# Patient Record
Sex: Female | Born: 1935 | Race: White | Hispanic: No | Smoking: Former smoker
Health system: Southern US, Community
[De-identification: ages and names within clinical notes are randomized; demographics above are authoritative.]

## PROBLEM LIST (undated history)

## (undated) DIAGNOSIS — K922 Gastrointestinal hemorrhage, unspecified: Secondary | ICD-10-CM

## (undated) DIAGNOSIS — I779 Disorder of arteries and arterioles, unspecified: Secondary | ICD-10-CM

## (undated) DIAGNOSIS — K5790 Diverticulosis of intestine, part unspecified, without perforation or abscess without bleeding: Secondary | ICD-10-CM

## (undated) DIAGNOSIS — E785 Hyperlipidemia, unspecified: Secondary | ICD-10-CM

## (undated) DIAGNOSIS — K259 Gastric ulcer, unspecified as acute or chronic, without hemorrhage or perforation: Secondary | ICD-10-CM

## (undated) DIAGNOSIS — E669 Obesity, unspecified: Secondary | ICD-10-CM

## (undated) DIAGNOSIS — I739 Peripheral vascular disease, unspecified: Secondary | ICD-10-CM

## (undated) DIAGNOSIS — I5032 Chronic diastolic (congestive) heart failure: Secondary | ICD-10-CM

## (undated) DIAGNOSIS — D500 Iron deficiency anemia secondary to blood loss (chronic): Secondary | ICD-10-CM

## (undated) DIAGNOSIS — I2510 Atherosclerotic heart disease of native coronary artery without angina pectoris: Secondary | ICD-10-CM

## (undated) DIAGNOSIS — I890 Lymphedema, not elsewhere classified: Secondary | ICD-10-CM

## (undated) DIAGNOSIS — F17201 Nicotine dependence, unspecified, in remission: Secondary | ICD-10-CM

## (undated) DIAGNOSIS — I10 Essential (primary) hypertension: Secondary | ICD-10-CM

## (undated) DIAGNOSIS — I482 Chronic atrial fibrillation, unspecified: Secondary | ICD-10-CM

## (undated) DIAGNOSIS — I701 Atherosclerosis of renal artery: Secondary | ICD-10-CM

## (undated) DIAGNOSIS — Z7901 Long term (current) use of anticoagulants: Secondary | ICD-10-CM

## (undated) DIAGNOSIS — K31819 Angiodysplasia of stomach and duodenum without bleeding: Secondary | ICD-10-CM

## (undated) HISTORY — DX: Gastrointestinal hemorrhage, unspecified: K92.2

## (undated) HISTORY — DX: Lymphedema, not elsewhere classified: I89.0

## (undated) HISTORY — DX: Hyperlipidemia, unspecified: E78.5

## (undated) HISTORY — PX: CAROTID ENDARTERECTOMY: SUR193

## (undated) HISTORY — DX: Atherosclerosis of renal artery: I70.1

## (undated) HISTORY — DX: Essential (primary) hypertension: I10

## (undated) HISTORY — DX: Chronic diastolic (congestive) heart failure: I50.32

## (undated) HISTORY — DX: Long term (current) use of anticoagulants: Z79.01

## (undated) HISTORY — DX: Chronic atrial fibrillation, unspecified: I48.20

## (undated) HISTORY — DX: Peripheral vascular disease, unspecified: I73.9

## (undated) HISTORY — DX: Atherosclerotic heart disease of native coronary artery without angina pectoris: I25.10

## (undated) HISTORY — DX: Obesity, unspecified: E66.9

## (undated) HISTORY — DX: Iron deficiency anemia secondary to blood loss (chronic): D50.0

## (undated) HISTORY — DX: Nicotine dependence, unspecified, in remission: F17.201

## (undated) HISTORY — DX: Disorder of arteries and arterioles, unspecified: I77.9

## (undated) HISTORY — PX: APPENDECTOMY: SHX54

## (undated) HISTORY — PX: CORONARY ANGIOPLASTY: SHX604

---

## 2001-06-05 ENCOUNTER — Ambulatory Visit (HOSPITAL_COMMUNITY): Admission: RE | Admit: 2001-06-05 | Discharge: 2001-06-05 | Payer: Self-pay | Admitting: Cardiology

## 2001-06-05 ENCOUNTER — Encounter: Payer: Self-pay | Admitting: Cardiology

## 2001-06-06 ENCOUNTER — Ambulatory Visit (HOSPITAL_COMMUNITY): Admission: RE | Admit: 2001-06-06 | Discharge: 2001-06-07 | Payer: Self-pay | Admitting: *Deleted

## 2001-07-08 ENCOUNTER — Encounter: Admission: RE | Admit: 2001-07-08 | Discharge: 2001-10-06 | Payer: Self-pay | Admitting: Cardiology

## 2001-12-22 ENCOUNTER — Ambulatory Visit (HOSPITAL_COMMUNITY): Admission: RE | Admit: 2001-12-22 | Discharge: 2001-12-22 | Payer: Self-pay | Admitting: Cardiology

## 2001-12-22 ENCOUNTER — Encounter: Payer: Self-pay | Admitting: Cardiology

## 2002-05-20 ENCOUNTER — Ambulatory Visit (HOSPITAL_COMMUNITY): Admission: RE | Admit: 2002-05-20 | Discharge: 2002-05-21 | Payer: Self-pay | Admitting: Cardiology

## 2002-05-20 ENCOUNTER — Encounter: Payer: Self-pay | Admitting: Cardiology

## 2003-01-19 ENCOUNTER — Ambulatory Visit (HOSPITAL_COMMUNITY): Admission: RE | Admit: 2003-01-19 | Discharge: 2003-01-19 | Payer: Self-pay | Admitting: *Deleted

## 2003-07-22 ENCOUNTER — Encounter (INDEPENDENT_AMBULATORY_CARE_PROVIDER_SITE_OTHER): Payer: Self-pay | Admitting: *Deleted

## 2003-07-22 ENCOUNTER — Inpatient Hospital Stay (HOSPITAL_COMMUNITY): Admission: RE | Admit: 2003-07-22 | Discharge: 2003-07-23 | Payer: Self-pay | Admitting: Vascular Surgery

## 2003-08-19 ENCOUNTER — Ambulatory Visit (HOSPITAL_COMMUNITY): Admission: RE | Admit: 2003-08-19 | Discharge: 2003-08-19 | Payer: Self-pay | Admitting: Family Medicine

## 2004-05-09 ENCOUNTER — Ambulatory Visit: Payer: Self-pay | Admitting: *Deleted

## 2005-05-09 ENCOUNTER — Ambulatory Visit: Payer: Self-pay | Admitting: *Deleted

## 2006-05-22 ENCOUNTER — Ambulatory Visit: Payer: Self-pay | Admitting: Cardiology

## 2006-08-28 ENCOUNTER — Ambulatory Visit: Payer: Self-pay | Admitting: Vascular Surgery

## 2007-03-11 ENCOUNTER — Ambulatory Visit: Payer: Self-pay | Admitting: Vascular Surgery

## 2007-10-06 ENCOUNTER — Ambulatory Visit: Payer: Self-pay | Admitting: Cardiology

## 2007-10-07 ENCOUNTER — Ambulatory Visit: Payer: Self-pay | Admitting: Vascular Surgery

## 2008-04-12 ENCOUNTER — Ambulatory Visit: Payer: Self-pay | Admitting: Vascular Surgery

## 2008-09-06 ENCOUNTER — Other Ambulatory Visit: Admission: RE | Admit: 2008-09-06 | Discharge: 2008-09-06 | Payer: Self-pay | Admitting: Orthopaedic Surgery

## 2008-09-14 ENCOUNTER — Ambulatory Visit: Payer: Self-pay | Admitting: Vascular Surgery

## 2008-09-23 ENCOUNTER — Encounter (INDEPENDENT_AMBULATORY_CARE_PROVIDER_SITE_OTHER): Payer: Self-pay | Admitting: *Deleted

## 2008-09-27 ENCOUNTER — Encounter: Payer: Self-pay | Admitting: Cardiology

## 2008-09-27 ENCOUNTER — Ambulatory Visit: Payer: Self-pay | Admitting: Cardiology

## 2008-09-28 ENCOUNTER — Telehealth: Payer: Self-pay | Admitting: Cardiology

## 2008-09-29 ENCOUNTER — Encounter: Payer: Self-pay | Admitting: Vascular Surgery

## 2008-09-29 ENCOUNTER — Ambulatory Visit: Payer: Self-pay | Admitting: Vascular Surgery

## 2008-09-29 ENCOUNTER — Inpatient Hospital Stay (HOSPITAL_COMMUNITY): Admission: RE | Admit: 2008-09-29 | Discharge: 2008-09-30 | Payer: Self-pay | Admitting: Vascular Surgery

## 2008-10-12 ENCOUNTER — Ambulatory Visit: Payer: Self-pay | Admitting: Vascular Surgery

## 2009-04-07 ENCOUNTER — Ambulatory Visit: Payer: Self-pay | Admitting: Vascular Surgery

## 2009-08-15 ENCOUNTER — Ambulatory Visit (HOSPITAL_COMMUNITY): Admission: RE | Admit: 2009-08-15 | Discharge: 2009-08-15 | Payer: Self-pay | Admitting: Family Medicine

## 2009-08-16 ENCOUNTER — Ambulatory Visit (HOSPITAL_COMMUNITY): Admission: RE | Admit: 2009-08-16 | Discharge: 2009-08-16 | Payer: Self-pay | Admitting: Family Medicine

## 2009-08-31 ENCOUNTER — Inpatient Hospital Stay (HOSPITAL_COMMUNITY): Admission: EM | Admit: 2009-08-31 | Discharge: 2009-09-03 | Payer: Self-pay | Admitting: Emergency Medicine

## 2009-09-01 ENCOUNTER — Encounter (INDEPENDENT_AMBULATORY_CARE_PROVIDER_SITE_OTHER): Payer: Self-pay | Admitting: Internal Medicine

## 2009-09-01 ENCOUNTER — Ambulatory Visit: Payer: Self-pay | Admitting: Cardiology

## 2009-09-07 ENCOUNTER — Ambulatory Visit: Payer: Self-pay | Admitting: Cardiology

## 2009-09-14 ENCOUNTER — Ambulatory Visit: Payer: Self-pay | Admitting: Cardiology

## 2009-09-14 LAB — CONVERTED CEMR LAB: POC INR: 1.9

## 2009-09-20 ENCOUNTER — Encounter: Payer: Self-pay | Admitting: Adult Health

## 2009-09-20 ENCOUNTER — Ambulatory Visit: Payer: Self-pay | Admitting: Cardiology

## 2009-09-22 ENCOUNTER — Encounter: Payer: Self-pay | Admitting: Adult Health

## 2009-09-22 ENCOUNTER — Ambulatory Visit: Payer: Self-pay | Admitting: Cardiology

## 2009-09-22 LAB — CONVERTED CEMR LAB: POC INR: 2.3

## 2009-10-06 ENCOUNTER — Ambulatory Visit: Payer: Self-pay | Admitting: Vascular Surgery

## 2009-10-10 ENCOUNTER — Ambulatory Visit: Payer: Self-pay | Admitting: Cardiology

## 2009-10-10 LAB — CONVERTED CEMR LAB: POC INR: 2.2

## 2009-10-19 ENCOUNTER — Telehealth (INDEPENDENT_AMBULATORY_CARE_PROVIDER_SITE_OTHER): Payer: Self-pay | Admitting: *Deleted

## 2009-10-20 ENCOUNTER — Ambulatory Visit: Payer: Self-pay | Admitting: Cardiology

## 2009-10-28 ENCOUNTER — Encounter: Payer: Self-pay | Admitting: Cardiology

## 2009-11-07 ENCOUNTER — Ambulatory Visit: Payer: Self-pay | Admitting: Cardiology

## 2009-11-15 ENCOUNTER — Telehealth (INDEPENDENT_AMBULATORY_CARE_PROVIDER_SITE_OTHER): Payer: Self-pay

## 2009-11-29 ENCOUNTER — Ambulatory Visit: Payer: Self-pay | Admitting: Cardiology

## 2009-12-05 ENCOUNTER — Ambulatory Visit: Payer: Self-pay | Admitting: Cardiology

## 2009-12-12 ENCOUNTER — Encounter (INDEPENDENT_AMBULATORY_CARE_PROVIDER_SITE_OTHER): Payer: Self-pay | Admitting: *Deleted

## 2010-01-02 ENCOUNTER — Ambulatory Visit: Payer: Self-pay | Admitting: Cardiology

## 2010-01-02 LAB — CONVERTED CEMR LAB: POC INR: 2.4

## 2010-01-26 ENCOUNTER — Encounter (INDEPENDENT_AMBULATORY_CARE_PROVIDER_SITE_OTHER): Payer: Self-pay | Admitting: *Deleted

## 2010-01-26 LAB — CONVERTED CEMR LAB
AST: 21 units/L
BUN: 17 mg/dL
CO2: 27 meq/L
Chloride: 104 meq/L
Cholesterol: 170 mg/dL
Creatinine, Ser: 0.9 mg/dL
Eosinophils Absolute: 0.1 10*3/uL
Eosinophils Relative: 1 %
Glucose, Bld: 107 mg/dL
HCT: 46.6 %
HDL: 41 mg/dL
Hemoglobin: 15.4 g/dL
LDL Cholesterol: 101 mg/dL
Lymphocytes Relative: 21 %
MCHC: 33 g/dL
Monocytes Relative: 8 %
Platelets: 208 10*3/uL
Triglycerides: 142 mg/dL

## 2010-01-30 ENCOUNTER — Ambulatory Visit: Payer: Self-pay | Admitting: Cardiology

## 2010-01-30 LAB — CONVERTED CEMR LAB: POC INR: 2.8

## 2010-02-27 ENCOUNTER — Ambulatory Visit: Payer: Self-pay | Admitting: Cardiology

## 2010-03-03 ENCOUNTER — Telehealth (INDEPENDENT_AMBULATORY_CARE_PROVIDER_SITE_OTHER): Payer: Self-pay | Admitting: *Deleted

## 2010-03-27 ENCOUNTER — Ambulatory Visit: Payer: Self-pay | Admitting: Cardiology

## 2010-03-31 ENCOUNTER — Encounter (INDEPENDENT_AMBULATORY_CARE_PROVIDER_SITE_OTHER): Payer: Self-pay | Admitting: *Deleted

## 2010-04-05 ENCOUNTER — Ambulatory Visit: Payer: Self-pay | Admitting: Cardiology

## 2010-04-05 DIAGNOSIS — I739 Peripheral vascular disease, unspecified: Secondary | ICD-10-CM | POA: Insufficient documentation

## 2010-04-05 DIAGNOSIS — I2510 Atherosclerotic heart disease of native coronary artery without angina pectoris: Secondary | ICD-10-CM

## 2010-04-05 DIAGNOSIS — I482 Chronic atrial fibrillation, unspecified: Secondary | ICD-10-CM

## 2010-04-05 LAB — CONVERTED CEMR LAB
AST: 21 units/L
Albumin: 4 g/dL
Alkaline Phosphatase: 98 units/L
BUN: 17 mg/dL
CO2: 27 meq/L
Cholesterol: 170 mg/dL
GFR calc non Af Amer: >60 mL/min
Glomerular Filtration Rate, Af Am: >60 mL/min/{1.73_m2}
Glucose, Bld: 107 mg/dL
HCT: 46.6 %
Hemoglobin: 15.4 g/dL
MCV: 87.8 fL
Platelets: 208 10*3/uL
WBC: 8.1 10*3/uL

## 2010-04-06 DIAGNOSIS — R609 Edema, unspecified: Secondary | ICD-10-CM

## 2010-04-24 ENCOUNTER — Ambulatory Visit: Admission: RE | Admit: 2010-04-24 | Discharge: 2010-04-24 | Payer: Self-pay | Source: Home / Self Care

## 2010-05-08 ENCOUNTER — Telehealth (INDEPENDENT_AMBULATORY_CARE_PROVIDER_SITE_OTHER): Payer: Self-pay | Admitting: *Deleted

## 2010-05-16 NOTE — Assessment & Plan Note (Signed)
Summary: NURSE VISIT  Nurse Visit   Vital Signs:  Patient profile:   77 year old female Weight:      193 pounds O2 Sat:      95 % on Room air Pulse rate:   58 / minute BP sitting:   156 / 65  (left arm)  Vitals Entered ByLarita Fife Via LPN (November 29, 2009 8:56 AM)  O2 Flow:  Room air  Visit Type:  BP check/nurse visit Primary Provider:  Dr.Golding   History of Present Illness: S: Pt. arrives in office for BP check. B: On 10-20-09, pt. had a nurse visit/BP check due to having syncopal episode on 10-18-09. A: Pt. was advised at last nurse visit to increase Diovan to 160mg  qd , change Diltiazem to CD 240mg  qd and to refer to PMD for evaluation of syncope. Pt. has decided to see Dr. Phillips Odor since Dr. Nobie Putnam has retired and she will discuss syncope with him. Pt. did not bring meds or BP diary to this visit but states her BP's are similar to today's reading of 156/65 and that she is taking meds as directed. When pt. got home she called to go over med list, her meds and our list match. She c/o swelling in ankles at night, otherwise she has no complaints at this time. She denies any more episodes of syncope or dizziness. R: Pt. advised to elevate legs while at rest, to be careful while mobile and to continue current medications. We will call pt. with Dr. Marvel Plan recommendation's, if any.   Allergies: No Known Drug Allergies  Appended Document: NURSE VISIT Orthostatic BP's  Lying BP: 129/63  P:65 Sitting BP: 130/59 P:61 Standing BP: 143/62 P:60 No s/s  Clinical Lists Changes  Observations: Added new observation of PRIMARY MD: Dr.Golding (11/30/2009 10:25)

## 2010-05-16 NOTE — Medication Information (Signed)
Summary: ccr-lr  Anticoagulant Therapy  Managed by: Vashti Hey, RN PCP: Wallace Going Supervising MD: Dietrich Pates MD, Molly Maduro Indication 1: Atrial Fibrillation Lab Used: [facility] [place] Site: [place] INR POC 2.4  Dietary changes: no    Health status changes: no    Bleeding/hemorrhagic complications: no    Recent/future hospitalizations: no    Any changes in medication regimen? no    Recent/future dental: no  Any missed doses?: no       Is patient compliant with meds? yes       Allergies: No Known Drug Allergies  Anticoagulation Management History:      The patient is taking warfarin and comes in today for a routine follow up visit.  She is being anticoagulated due to Atrial Fibrillation .  Positive risk factors for bleeding include an age of 75 years or older.  Negative risk factors for bleeding include no history of CVA/TIA, no history of GI bleeding, and absence of serious comorbidities.  The bleeding index is 'intermediate risk'.  Positive CHADS2 values include History of CHF, History of HTN, and Age > 24 years old.  Negative CHADS2 values include History of Diabetes and Prior Stroke/CVA/TIA.  The start date was 09/01/2009.  Anticoagulation responsible provider: Dietrich Pates MD, Molly Maduro.  INR POC: 2.4.  Cuvette Lot#: [id number].    Anticoagulation Management Assessment/Plan:      The patient's current anticoagulation dose is Warfarin sodium 5 mg tabs: Take 1 tablet daily except 1 1/2 tablets on M,W,F or as directed by Anticoagulation Clinic.  The target INR is 2.0-3.0.  The next INR is due 01/30/2010.  Anticoagulation instructions were given to patient.  Results were reviewed/authorized by Vashti Hey, RN.  She was notified by Vashti Hey RN.         Prior Anticoagulation Instructions: INR 2.0 Increase coumadin to 5mg  once daily except 7.5mg  on Mondays, Wednesdays and Fridays  Current Anticoagulation Instructions: INR 2.4 Continue coumadin 5mg  once daily except 7.5mg   on Mondays, Wednesdays and Fridays

## 2010-05-16 NOTE — Assessment & Plan Note (Signed)
Summary: post hosp [name] per pt phone call/tg  Medications Added CARDIZEM CD 300 MG XR24H-CAP (DILTIAZEM HCL COATED BEADS) take 1 tab daily FUROSEMIDE 80 MG TABS (FUROSEMIDE) take 1 tab two times a day LIPITOR 40 MG TABS (ATORVASTATIN CALCIUM) take 1 tab daily K-LOR 20 MEQ PACK (POTASSIUM CHLORIDE) take 1 tab two times a day * OXYGEN 3 liters      Allergies Added: NKDA  Visit Type:  Follow-up Primary Provider:  Dr.Golding  CC:  no cardiology complaints.  History of Present Illness: [name] is a very pleasant77 y/o CF who we are seeing on follow-up after having Right carotid enderectomy in 2010, CAD, s/p PCI, PVD with bilateral stents 2004, who was recently hospitalized for newly diagnoses atrial fib.  She presented with Afib RVR on 08/31/2009 where she was seen on consutation.  She was started on p.o. cardiazem after intially on a drip.  She was also started on coumadin.  She is followed in our coumadin clinic here in [place].  She remained on her bisprolol but norvasc was discontinued along with decreasing the dose of Diovan to avoid hyptension.  Since discharge she has been doing well.  She has continued complaints of LEE which have been chronic for her.  She continues to take the Lasix as directed. She is due for PT INR check in 2 days.  Her weight has remained consistant and she has no complaints of DOE.  She requests that her home 02 be stopped.  Current Medications (verified): 1)  Diovan 160 Mg  Tabs (Valsartan) .... Take 1/2 Tablet By Mouth Daily 2)  Bisoprolol Fumarate 10 Mg Tabs (Bisoprolol Fumarate) .... Take 2  Tablets By Mouth Daily 3)  Tums 500 Mg Chew (Calcium Carbonate Antacid) .... As Needed 4)  Warfarin Sodium 5 Mg Tabs (Warfarin Sodium) .... Take 1 Tablet Daily or As Directed By Anticoagulation Clinic 5)  Cardizem Cd 300 Mg Xr24h-Cap (Diltiazem Hcl Coated Beads) .... Take 1 Tab Daily 6)  Furosemide 80 Mg Tabs (Furosemide) .... Take 1 Tab Two Times A Day 7)   Lipitor 40 Mg Tabs (Atorvastatin Calcium) .... Take 1 Tab Daily 8)  K-Lor 20 Meq Pack (Potassium Chloride) .... Take 1 Tab Two Times A Day 9)  Oxygen .Marland Kitchen.. 3 Liters  Allergies (verified): No Known Drug Allergies  Past History:  Past medical, surgical, family and social histories (including risk factors) reviewed, and no changes noted (except as noted below).  Past Medical History: Reviewed history from 09/22/2008 and no changes required. Current Problems:  UNSPECIFIED CEREBROVASCULAR DISEASE (ICD-437.9) DYSLIPIDEMIA (ICD-272.4) CAD (ICD-414.00) HYPERTENSION (ICD-401.9) RENAL ARTERY STENOSIS (ICD-440.1)    Past Surgical History: Reviewed history from 09/22/2008 and no changes required. Angioplasty 1989 Bilateral Stenting- Renal Arteries 2004 Cath-Stent to RCA 60% stenosis mid LAD                               60% 2nd DM ADENO CL EF NL  NEG Ischemia                               NEG Infarct  Family History: Reviewed history from 09/22/2008 and no changes required. Father:deceased 43 yo  Unknw Mother:deceased 54 yo Lung ca,HIV Siblings:Sisters 1 living healthy age 63  Social History: Reviewed history from 09/22/2008 and no changes required. House wife  [name] Married  children 4 Tobacco Use -  No.  Tobacco Use - Former.  Alcohol Use - no Drug Use - no  Review of Systems       LEE chronic  All other systems have been reviewed and are negative unless stated above.   Vital Signs:  Patient profile:   77 year old female Weight:      191 pounds BMI:     37.44 O2 Sat:      95 % on Room air Pulse rate:   75 / minute BP sitting:   134 / 65  (right arm)  Vitals Entered By: Dreama Saa, CNA (September 20, 2009 11:26 AM)  O2 Flow:  Room air  Physical Exam  General:  Well developed, well nourished, in no acute distress. Lungs:  Clear bilaterally to auscultation and percussion. Heart:  Non-displaced PMI, chest non-tender; regular rate and rhythm, S1, S2 without  murmurs, rubs or gallops. Carotid upstroke normal, no bruit. Normal abdominal aortic size, no bruits. Femorals normal pulses, no bruits. Pedals normal pulses. No edema, no varicosities. Abdomen:  Bowel sounds positive; abdomen soft and non-tender without masses, organomegaly, or hernias noted. No hepatosplenomegaly. Msk:  Back normal, normal gait. Muscle strength and tone normal. Extremities:  trace left pedal edema and trace right pedal edema.   Neurologic:  Alert and oriented x 3. Skin:  Multiple bruises Psych:  Normal affect.   EKG  Procedure date:  09/20/2009  Findings:      Atrial fibrillation with a controlled ventricular response rate of: 69bpm  Impression & Recommendations:  Problem # 1:  ATRIAL FIBRILLATION (ICD-427.31) She is doing well from cardiac standpoint.  Her HR is well controlled on current dose of diltazem.  She remains on coumadin and is seeing our clinic in 2 days.  Will make no changes and follow-up in 6 months. The following medications were removed from the medication list:    Aspirin 81 Mg Tbec (Aspirin) .Marland Kitchen... Take one tablet by mouth daily Her updated medication list for this problem includes:    Bisoprolol Fumarate 10 Mg Tabs (Bisoprolol fumarate) .Marland Kitchen... Take 2  tablets by mouth daily    Warfarin Sodium 5 Mg Tabs (Warfarin sodium) .Marland Kitchen... Take 1 tablet daily or as directed by anticoagulation clinic  Problem # 2:  CAD (ICD-414.00) Assessment: Unchanged  The following medications were removed from the medication list:    Aspirin 81 Mg Tbec (Aspirin) .Marland Kitchen... Take one tablet by mouth daily Her updated medication list for this problem includes:    Bisoprolol Fumarate 10 Mg Tabs (Bisoprolol fumarate) .Marland Kitchen... Take 2  tablets by mouth daily    Warfarin Sodium 5 Mg Tabs (Warfarin sodium) .Marland Kitchen... Take 1 tablet daily or as directed by anticoagulation clinic    Cardizem Cd 300 Mg Xr24h-cap (Diltiazem hcl coated beads) .Marland Kitchen... Take 1 tab daily  Problem # 3:  CEREBROVASCULAR  DISEASE (ICD-437.9) Recent R carotid endarterectomy.  L carotid bruit is ausultated.  Will continue to monitor.  Problem # 4:  Hx of TOBACCO ABUSE (ICD-305.1) She is not smoking anymore.  I have had her walk in the hall and her 02 sats were consistantly above 90%.  She can stop the home O2 from our standpoint,  Patient Instructions: 1)  Your physician recommends that you schedule a follow-up appointment in: 6 months 2)  Your physician recommends that you continue on your current medications as directed. Please refer to the Current Medication list given to you today.

## 2010-05-16 NOTE — Assessment & Plan Note (Signed)
Summary: NURSE VISIT/10:30  Nurse Visit   Vital Signs:  Patient profile:   77 year old female Height:      60 inches Weight:      188 pounds O2 Sat:      95 % on Room air Pulse (ortho):   59 / minute BP sitting:   166 / 66  (left arm) BP standing:   159 / 66  Vitals Entered By: Teressa Lower RN (October 20, 2009 10:30 AM)  O2 Flow:  Room air  Serial Vital Signs/Assessments:  Time      Position  BP       Pulse  Resp  Temp     By 10:59 AM  Lying LA  164/66   64                    Evarose Altland RN 10:59 AM  Sitting   165/66   62                    Taniqua Issa RN 10:59 AM  Standing  159/66   59                    Adasyn Mcadams RN  Comments: 10:59 AM no symptoms during ortho vital signs By: Teressa Lower RN    Visit Type:  nurse visit per Amilia Vandenbrink Primary Provider:  Dr.Golding   History of Present Illness: S: nurse visit B: syncope on Tuesday 10/18/09 A: no c/o since syncopal episode, had 2 similar episodes in 2004, no visual changes, no headaches     ekg performed showed NSR R: possible CT scan or event monitor?   10/26/09  Increase diovan to 160 mg once daily Change diltiazem to CD 240 mg once daily Refer to PMD for evaluation of syncope. BP check in 1 month.  Brashear Bing, M.D.  I spoke with pt and spouse discussed new medicaitons and nurse visit in 1 month, pt's need to see Dr. Phillips Odor for syncope. Nurse visit in 1 month   Teressa Lower RN  October 28, 2009 4:50 PM   Preventive Screening-Counseling & Management  Alcohol-Tobacco     Smoking Status: never   Current Medications (verified): 1)  Diovan 160 Mg  Tabs (Valsartan) .... Take 1 Tablet By Mouth Once A Day 2)  Bisoprolol Fumarate 10 Mg Tabs (Bisoprolol Fumarate) .... Take 2  Tablets By Mouth Daily 3)  Warfarin Sodium 5 Mg Tabs (Warfarin Sodium) .... Take 1 Tablet Daily or As Directed By Anticoagulation Clinic 4)  Diltiazem Hcl Er Beads 240 Mg Xr24h-Cap (Diltiazem Hcl Er Beads) .... Take One Capsule By  Mouth Daily 5)  Furosemide 80 Mg Tabs (Furosemide) .... Take 1 Tab Two Times A Day 6)  Lipitor 40 Mg Tabs (Atorvastatin Calcium) .... Take 1 Tab Daily 7)  K-Lor 20 Meq Pack (Potassium Chloride) .... Take 1 Tab Two Times A Day 8)  Oxygen .Marland Kitchen.. 3 Liters  Allergies (verified): No Known Drug Allergies Prescriptions: DILTIAZEM HCL ER BEADS 240 MG XR24H-CAP (DILTIAZEM HCL ER BEADS) Take one capsule by mouth daily  #30 x 3   Entered by:   Teressa Lower RN   Authorized by:   Kathlen Brunswick, MD, [facility]   Signed by:   Teressa Lower RN on 10/27/2009   Method used:   Electronically to        [facility] (retail)       [address]  [place], [place]  [address]       Ph: [phone]       Fax: [phone]   RxID:   [id number]

## 2010-05-16 NOTE — Medication Information (Signed)
Summary: ccr-lr  Anticoagulant Therapy  Managed by: Vashti Hey, RN PCP: Wallace Going Supervising MD: Diona Browner MD, Remi Deter Indication 1: Atrial Fibrillation Lab Used: [facility] [place] Site: [place] INR POC 2.8  Dietary changes: no    Health status changes: no    Bleeding/hemorrhagic complications: no    Recent/future hospitalizations: no    Any changes in medication regimen? no    Recent/future dental: no  Any missed doses?: no       Is patient compliant with meds? yes       Allergies: No Known Drug Allergies  Anticoagulation Management History:      The patient is taking warfarin and comes in today for a routine follow up visit.  She is being anticoagulated because of Atrial Fibrillation .  Positive risk factors for bleeding include an age of 75 years or older.  Negative risk factors for bleeding include no history of CVA/TIA, no history of GI bleeding, and absence of serious comorbidities.  The bleeding index is 'intermediate risk'.  Positive CHADS2 values include History of CHF, History of HTN, and Age > 58 years old.  Negative CHADS2 values include History of Diabetes and Prior Stroke/CVA/TIA.  The start date was 09/01/2009.  Anticoagulation responsible provider: Diona Browner MD, Remi Deter.  INR POC: 2.8.  Cuvette Lot#: [id number].    Anticoagulation Management Assessment/Plan:      The patient's current anticoagulation dose is Warfarin sodium 5 mg tabs: Take 1 tablet daily except 1 1/2 tablets on M,W,F or as directed by Anticoagulation Clinic.  The target INR is 2.0-3.0.  The next INR is due 02/27/2010.  Anticoagulation instructions were given to patient.  Results were reviewed/authorized by Vashti Hey, RN.  She was notified by Vashti Hey RN.         Prior Anticoagulation Instructions: INR 2.4 Continue coumadin 5mg  once daily except 7.5mg  on Mondays, Wednesdays and Fridays  Current Anticoagulation Instructions: INR 2.8 Continue coumadin 5mg  once daily except 7.5mg   on Mondays, Wednesdays and Fridays

## 2010-05-16 NOTE — Medication Information (Signed)
Summary: ccr-lr  Anticoagulant Therapy  Managed by: Vashti Hey, RN PCP: Wallace Going Supervising MD: Dietrich Pates MD, Molly Maduro Indication 1: Atrial Fibrillation Lab Used: [facility] [place] Site: [place] INR POC 2.4  Dietary changes: no    Health status changes: no    Bleeding/hemorrhagic complications: no    Recent/future hospitalizations: no    Any changes in medication regimen? no    Recent/future dental: no  Any missed doses?: no       Is patient compliant with meds? yes       Allergies: No Known Drug Allergies  Anticoagulation Management History:      The patient is taking warfarin and comes in today for a routine follow up visit.  Warfarin therapy is being given due to Atrial Fibrillation .  Positive risk factors for bleeding include an age of 75 years or older.  Negative risk factors for bleeding include no history of CVA/TIA, no history of GI bleeding, and absence of serious comorbidities.  The bleeding index is 'intermediate risk'.  Positive CHADS2 values include History of CHF, History of HTN, and Age > 75 years old.  Negative CHADS2 values include History of Diabetes and Prior Stroke/CVA/TIA.  The start date was 09/01/2009.  Anticoagulation responsible provider: Dietrich Pates MD, Molly Maduro.  INR POC: 2.4.  Cuvette Lot#: [id number].    Anticoagulation Management Assessment/Plan:      The patient's current anticoagulation dose is Warfarin sodium 5 mg tabs: Take 1 tablet daily except 1 1/2 tablets on M,W,F or as directed by Anticoagulation Clinic.  The target INR is 2.0-3.0.  The next INR is due 03/27/2010.  Anticoagulation instructions were given to patient.  Results were reviewed/authorized by Vashti Hey, RN.  She was notified by Vashti Hey RN.         Prior Anticoagulation Instructions: INR 2.8 Continue coumadin 5mg  once daily except 7.5mg  on Mondays, Wednesdays and Fridays  Current Anticoagulation Instructions: INR 2.4 Continue coumadin 5mg  once daily except 7.5mg   once daily Mondays, Wednesdays and Fridays

## 2010-05-16 NOTE — Progress Notes (Signed)
Summary: WAS THERE A CHANGE IN PT MEDS   Phone Note Call from Patient Call back at Home Phone [phone]   Caller: PT Reason for Call: Talk to Nurse Summary of Call: PER PT WHEN SHE GOT HER LAST REFILL OF K-LOR  THE DOSE HAD CHANGED IS THIS CORRECT? SHE WAS TAKING TWO PILLS A DAY AND THE LAST RX WAS CALLED IN FOR 1 A DAY. Initial call taken by: Faythe Ghee,  November 15, 2009 10:12 AM  Follow-up for Phone Call        Dr. Phillips Odor filled RX but pt. is requesting that we refill meds as she has never seen Dr. Phillips Odor. Pt. was a  pt. of  Dr. Nobie Putnam who has retired.   Follow-up by: Larita Fife Via LPN,  November 15, 2009 2:49 PM    Prescriptions: K-LOR 20 MEQ PACK (POTASSIUM CHLORIDE) take 1 tab two times a day  #60 x 4   Entered by:   Larita Fife Via LPN   Authorized by:   Joni Reining, NP   Signed by:   Larita Fife Via LPN on 56/21/3086   Method used:   Electronically to        [facility] (retail)       [address]       [place], [place]  [address]       Ph: [phone]       Fax: [phone]   RxID:   [id number]

## 2010-05-16 NOTE — Medication Information (Signed)
Summary: post hosp protime per PT phone call/tg  Medications Added WARFARIN SODIUM 5 MG TABS (WARFARIN SODIUM) Take 1 tablet daily or as directed by Anticoagulation Clinic       Anticoagulant Therapy  Managed by: Vashti Hey, RN PCP: Judene Companion MD: Diona Browner MD, Remi Deter Indication 1: Atrial Fibrillation Lab Used: [facility] [place] Site: [place] INR POC 1.8  Dietary changes: no    Health status changes: yes       Details: new atrial fib  Bleeding/hemorrhagic complications: no    Recent/future hospitalizations: yes       Details: in [facility] 08/31/09 - 09/03/09 with new A Fib and CHF  Any changes in medication regimen? yes       Details: Started on coumadin in hosp.  Has 5mg  tablet  Sent home on 5mg  qd   D/C INR 1.72 on 5/21  Recent/future dental: no  Any missed doses?: no       Is patient compliant with meds? yes      Comments: Pt received coumadin teaching in the hospital.  Discussed Vit K foods and portion sizes and # servings with pt.  Drug interactions/adverse effects discussed.  Coumadin dosing and INR schedule discussed.  Pt verbalized understanding.  Allergies: No Known Drug Allergies  Anticoagulation Management History:      The patient comes in today for her initial visit for anticoagulation therapy.  The patient is on warfarin for Atrial Fibrillation .  Positive risk factors for bleeding include an age of 31 years or older.  Negative risk factors for bleeding include no history of CVA/TIA, no history of GI bleeding, and absence of serious comorbidities.  The bleeding index is 'intermediate risk'.  Positive CHADS2 values include History of CHF and History of HTN.  Negative CHADS2 values include Age > 14 years old, History of Diabetes, and Prior Stroke/CVA/TIA.  The start date was 09/01/2009.  Anticoagulation responsible provider: Diona Browner MD, Remi Deter.  INR POC: 1.8.  Cuvette Lot#: [id number].    Anticoagulation Management Assessment/Plan:      The  patient's current anticoagulation dose is Warfarin sodium 5 mg tabs: Take 1 tablet daily or as directed by Anticoagulation Clinic.  The target INR is 2.0-3.0.  The next INR is due 09/14/2009.  Anticoagulation instructions were given to patient.  Results were reviewed/authorized by Vashti Hey, RN.  She was notified by Vashti Hey RN.         Current Anticoagulation Instructions: INR 1.8 Continue coumadin 5mg  once daily

## 2010-05-16 NOTE — Letter (Signed)
Summary: Generic Letter  [facility] at [facility]  [address] [place] [address]   Phone: [phone]  Fax: [phone]        September 22, 2009 MRN: [id number]    [name] [address] [place], [place]  [address]    To Whom It May Concern,   [name] can stop O2 from a cardiac standpoint She    is not smoking anymore.  I have had her walk in the hall and her 02 sats    were consistantly above 90%.           Sincerely,  Gladis Riffle, NP This letter has been electronically signed by your physician.  Appended Document: Generic Letter Faxed letter to d/c O2 to [facility] @ [phone].

## 2010-05-16 NOTE — Progress Notes (Signed)
Summary: syncope   Phone Note Call from Patient Call back at Home Phone [phone]   Caller: pt Reason for Call: Talk to Nurse Summary of Call: pt passed out yesterday evening around 4:00. She feels fine today. Initial call taken by: Faythe Ghee,  October 19, 2009 8:55 AM  Follow-up for Phone Call        Pt was taking care of her grandchild for the day.  She began to feel dizzy and then she passed out.  She took her bp immediately after awaking 143/61.  Today she feels fine bp 155/69, 68.  Coming in tomorrow for INR check, plan to do nurse visit 10/20/09 at 10:30am with EKG Follow-up by: Teressa Lower RN,  October 19, 2009 4:56 PM

## 2010-05-16 NOTE — Progress Notes (Signed)
Summary: Rx Refill   Phone Note Call from Patient   Caller: Husband Reason for Call: Refill Medication Summary of Call: Returned call from pt's husband concerning no refills on cardizem being at the pharmacy and was unsure if the pt should continue to take the medication.  I have reviewed the pt's record and it appears that there was a refill faxed in on 11/7 but it is possible that this transmission did not go through.  I have called in the prescription to the [facility] pharmacy and informed the husband the patient should continue with this medication.  He voiced understanding.    Cardizem 240 mg CD 1 tab by mouth daily, #30, RF 3 Initial call taken by: Robbi Garter NP-PA,  March 03, 2010 6:19 PM

## 2010-05-16 NOTE — Medication Information (Signed)
Summary: ccr-lr  Anticoagulant Therapy  Managed by: Vashti Hey, RN PCP: Wallace Going Supervising MD: Dietrich Pates MD, Molly Maduro Indication 1: Atrial Fibrillation Lab Used: [facility] [place] Site: [place] INR POC 2.3  Dietary changes: no    Health status changes: no    Bleeding/hemorrhagic complications: no    Recent/future hospitalizations: no    Any changes in medication regimen? no    Recent/future dental: no  Any missed doses?: no       Is patient compliant with meds? yes       Allergies: No Known Drug Allergies  Anticoagulation Management History:      The patient is taking warfarin and comes in today for a routine follow up visit.  The patient is on warfarin for Atrial Fibrillation .  Positive risk factors for bleeding include an age of 75 years or older.  Negative risk factors for bleeding include no history of CVA/TIA, no history of GI bleeding, and absence of serious comorbidities.  The bleeding index is 'intermediate risk'.  Positive CHADS2 values include History of CHF and History of HTN.  Negative CHADS2 values include Age > 75 years old, History of Diabetes, and Prior Stroke/CVA/TIA.  The start date was 09/01/2009.  Anticoagulation responsible provider: Dietrich Pates MD, Molly Maduro.  INR POC: 2.3.  Cuvette Lot#: [id number].    Anticoagulation Management Assessment/Plan:      The patient's current anticoagulation dose is Warfarin sodium 5 mg tabs: Take 1 tablet daily or as directed by Anticoagulation Clinic.  The target INR is 2.0-3.0.  The next INR is due 10/10/2009.  Anticoagulation instructions were given to patient.  Results were reviewed/authorized by Vashti Hey, RN.  She was notified by Vashti Hey RN.         Prior Anticoagulation Instructions: INR 1.9 Increase coumadin to 5mg  once daily except 7.5mg  on Wednesdays and Saturdays  Current Anticoagulation Instructions: INR 2.3 Continue coumadin 5mg  once daily except 7.5mg  on Wednesdays and Saturdays

## 2010-05-16 NOTE — Medication Information (Signed)
Summary: CCR-LR  Anticoagulant Therapy  Managed by: Vashti Hey, RN PCP: Judene Companion MD: Dietrich Pates MD, Molly Maduro Indication 1: Atrial Fibrillation Lab Used: [facility] [place] Site: [place] INR POC 1.9  Dietary changes: no    Health status changes: no    Bleeding/hemorrhagic complications: no    Recent/future hospitalizations: no    Any changes in medication regimen? no    Recent/future dental: no  Any missed doses?: no       Is patient compliant with meds? yes       Allergies: No Known Drug Allergies  Anticoagulation Management History:      The patient is taking warfarin and comes in today for a routine follow up visit.  The patient is on warfarin for Atrial Fibrillation .  Positive risk factors for bleeding include an age of 40 years or older.  Negative risk factors for bleeding include no history of CVA/TIA, no history of GI bleeding, and absence of serious comorbidities.  The bleeding index is 'intermediate risk'.  Positive CHADS2 values include History of CHF and History of HTN.  Negative CHADS2 values include Age > 37 years old, History of Diabetes, and Prior Stroke/CVA/TIA.  The start date was 09/01/2009.  Anticoagulation responsible provider: Dietrich Pates MD, Molly Maduro.  INR POC: 1.9.  Cuvette Lot#: [id number].    Anticoagulation Management Assessment/Plan:      The patient's current anticoagulation dose is Warfarin sodium 5 mg tabs: Take 1 tablet daily or as directed by Anticoagulation Clinic.  The target INR is 2.0-3.0.  The next INR is due 09/22/2009.  Anticoagulation instructions were given to patient.  Results were reviewed/authorized by Vashti Hey, RN.  She was notified by Vashti Hey RN.         Prior Anticoagulation Instructions: INR 1.8 Continue coumadin 5mg  once daily   Current Anticoagulation Instructions: INR 1.9 Increase coumadin to 5mg  once daily except 7.5mg  on Wednesdays and Saturdays

## 2010-05-16 NOTE — Medication Information (Signed)
Summary: ccr-lr  Anticoagulant Therapy  Managed by: Vashti Hey, RN PCP: Wallace Going Supervising MD: Dietrich Pates MD, Molly Maduro Indication 1: Atrial Fibrillation Lab Used: [facility]  Site: [place] INR POC 2.2  Dietary changes: no    Health status changes: no    Bleeding/hemorrhagic complications: no    Recent/future hospitalizations: no    Any changes in medication regimen? no    Recent/future dental: no  Any missed doses?: no       Is patient compliant with meds? yes       Allergies: No Known Drug Allergies  Anticoagulation Management History:      The patient is taking warfarin and comes in today for a routine follow up visit.  The patient is on warfarin for Atrial Fibrillation .  Positive risk factors for bleeding include an age of 75 years or older.  Negative risk factors for bleeding include no history of CVA/TIA, no history of GI bleeding, and absence of serious comorbidities.  The bleeding index is 'intermediate risk'.  Positive CHADS2 values include History of CHF and History of HTN.  Negative CHADS2 values include Age > 59 years old, History of Diabetes, and Prior Stroke/CVA/TIA.  The start date was 09/01/2009.  Anticoagulation responsible provider: Dietrich Pates MD, Molly Maduro.  INR POC: 2.2.  Cuvette Lot#: [id number].    Anticoagulation Management Assessment/Plan:      The patient's current anticoagulation dose is Warfarin sodium 5 mg tabs: Take 1 tablet daily or as directed by Anticoagulation Clinic.  The target INR is 2.0-3.0.  The next INR is due 11/07/2009.  Anticoagulation instructions were given to patient.  Results were reviewed/authorized by Vashti Hey, RN.  She was notified by Vashti Hey RN.         Prior Anticoagulation Instructions: INR 2.3 Continue coumadin 5mg  once daily except 7.5mg  on Wednesdays and Saturdays  Current Anticoagulation Instructions: INR 2.2 Continue coumadin 5mg  once daily except 7.5mg  on Wednesdays and Saturdays

## 2010-05-16 NOTE — Medication Information (Signed)
Summary: ccr-lr  Anticoagulant Therapy  Managed by: Vashti Hey, RN PCP: Wallace Going Supervising MD: Dietrich Pates MD, Molly Maduro Indication 1: Atrial Fibrillation Lab Used: [facility] [place] Site: [place] INR POC 2.2  Dietary changes: no    Health status changes: no    Bleeding/hemorrhagic complications: no    Recent/future hospitalizations: no    Any changes in medication regimen? no    Recent/future dental: no  Any missed doses?: no       Is patient compliant with meds? yes       Allergies: No Known Drug Allergies  Anticoagulation Management History:      The patient is taking warfarin and comes in today for a routine follow up visit.  She is being anticoagulated due to Atrial Fibrillation .  Positive risk factors for bleeding include an age of 23 years or older.  Negative risk factors for bleeding include no history of CVA/TIA, no history of GI bleeding, and absence of serious comorbidities.  The bleeding index is 'intermediate risk'.  Positive CHADS2 values include History of CHF, History of HTN, and Age > 52 years old.  Negative CHADS2 values include History of Diabetes and Prior Stroke/CVA/TIA.  The start date was 09/01/2009.  Anticoagulation responsible provider: Dietrich Pates MD, Molly Maduro.  INR POC: 2.2.  Cuvette Lot#: [id number].    Anticoagulation Management Assessment/Plan:      The patient's current anticoagulation dose is Warfarin sodium 5 mg tabs: Take 1 tablet daily or as directed by Anticoagulation Clinic.  The target INR is 2.0-3.0.  The next INR is due 12/05/2009.  Anticoagulation instructions were given to patient.  Results were reviewed/authorized by Vashti Hey, RN.  She was notified by Vashti Hey RN.         Prior Anticoagulation Instructions: INR 2.2 Continue coumadin 5mg  once daily except 7.5mg  on Wednesdays and Saturdays  Current Anticoagulation Instructions: Same as Prior Instructions.

## 2010-05-16 NOTE — Medication Information (Signed)
Summary: ccr-lr  Anticoagulant Therapy  Managed by: Vashti Hey, RN PCP: Dr.Golding Supervising MD: Daleen Squibb MD, Maisie Fus Indication 1: Atrial Fibrillation Lab Used: [facility] [place] Site: [place] INR POC 2.0  Dietary changes: no    Health status changes: no    Bleeding/hemorrhagic complications: no    Recent/future hospitalizations: no    Any changes in medication regimen? no    Recent/future dental: no  Any missed doses?: no       Is patient compliant with meds? yes       Allergies: No Known Drug Allergies  Anticoagulation Management History:      The patient is taking warfarin and comes in today for a routine follow up visit.  She is being anticoagulated due to Atrial Fibrillation .  Positive risk factors for bleeding include an age of 47 years or older.  Negative risk factors for bleeding include no history of CVA/TIA, no history of GI bleeding, and absence of serious comorbidities.  The bleeding index is 'intermediate risk'.  Positive CHADS2 values include History of CHF, History of HTN, and Age > 91 years old.  Negative CHADS2 values include History of Diabetes and Prior Stroke/CVA/TIA.  The start date was 09/01/2009.  Anticoagulation responsible Elnathan Fulford: Daleen Squibb MD, Maisie Fus.  INR POC: 2.0.  Cuvette Lot#: [id number].    Anticoagulation Management Assessment/Plan:      The patient's current anticoagulation dose is Warfarin sodium 5 mg tabs: Take 1 tablet daily except 1 1/2 tablets on M,W,F or as directed by Anticoagulation Clinic.  The target INR is 2.0-3.0.  The next INR is due 01/02/2010.  Anticoagulation instructions were given to patient.  Results were reviewed/authorized by Vashti Hey, RN.  She was notified by Vashti Hey RN.         Prior Anticoagulation Instructions: INR 2.2 Continue coumadin 5mg  once daily except 7.5mg  on Wednesdays and Saturdays  Current Anticoagulation Instructions: INR 2.0 Increase coumadin to 5mg  once daily except 7.5mg  on Mondays,  Wednesdays and Fridays Prescriptions: WARFARIN SODIUM 5 MG TABS (WARFARIN SODIUM) Take 1 tablet daily except 1 1/2 tablets on M,W,F or as directed by Anticoagulation Clinic  #45 x 3   Entered by:   Vashti Hey RN   Authorized by:   Kathlen Brunswick, MD, [facility]   Signed by:   Vashti Hey RN on 12/05/2009   Method used:   Electronically to        [facility] [address] (retail)       [address]       [place], [place]  [address]       Ph: [phone]       Fax: [phone]   RxID:   [id number]

## 2010-05-16 NOTE — Miscellaneous (Signed)
  Clinical Lists Changes  Medications: Changed medication from DIOVAN 160 MG  TABS (VALSARTAN) Take 1/2 tablet by mouth once a day to DIOVAN 160 MG  TABS (VALSARTAN) Take 1 tablet by mouth once a day - Signed Rx of DIOVAN 160 MG  TABS (VALSARTAN) Take 1 tablet by mouth once a day;  #30 x 6;  Signed;  Entered by: Teressa Lower RN;  Authorized by: Kathlen Brunswick, MD, [facility];  Method used: Electronically to [facility] [address] [address], [place], [place], [place]  [address], Ph: [phone], Fax: [phone]    Prescriptions: DIOVAN 160 MG  TABS (VALSARTAN) Take 1 tablet by mouth once a day  #30 x 6   Entered by:   Teressa Lower RN   Authorized by:   Kathlen Brunswick, MD, [facility]   Signed by:   Teressa Lower RN on 12/12/2009   Method used:   Electronically to        [facility] [address] (retail)       [address]       [place], [place]  [address]       Ph: [phone]       Fax: [phone]   RxID:   [id number]

## 2010-05-18 NOTE — Medication Information (Signed)
Summary: ccr-lr  Anticoagulant Therapy  Managed by: Vashti Hey, RN PCP: Dr.Golding Supervising MD: Daleen Squibb MD, Maisie Fus Indication 1: Atrial Fibrillation Lab Used: [facility] [place] Site: [place] INR POC 2.4  Dietary changes: no    Health status changes: no    Bleeding/hemorrhagic complications: no    Recent/future hospitalizations: no    Any changes in medication regimen? no    Recent/future dental: no  Any missed doses?: no       Is patient compliant with meds? yes       Allergies: No Known Drug Allergies  Anticoagulation Management History:      The patient is taking warfarin and comes in today for a routine follow up visit.  Warfarin therapy is being given due to Atrial Fibrillation .  Positive risk factors for bleeding include an age of 18 years or older.  Negative risk factors for bleeding include no history of CVA/TIA, no history of GI bleeding, and absence of serious comorbidities.  The bleeding index is 'intermediate risk'.  Positive CHADS2 values include History of CHF, History of HTN, and Age > 14 years old.  Negative CHADS2 values include History of Diabetes and Prior Stroke/CVA/TIA.  The start date was 09/01/2009.  Anticoagulation responsible provider: Daleen Squibb MD, Maisie Fus.  INR POC: 2.4.  Cuvette Lot#: [id number].    Anticoagulation Management Assessment/Plan:      The patient's current anticoagulation dose is Warfarin sodium 5 mg tabs: Take 1 tablet daily except 1 1/2 tablets on M,W,F or as directed by Anticoagulation Clinic.  The target INR is 2.0-3.0.  The next INR is due 04/24/2010.  Anticoagulation instructions were given to patient.  Results were reviewed/authorized by Vashti Hey, RN.  She was notified by Vashti Hey RN.         Prior Anticoagulation Instructions: INR 2.4 Continue coumadin 5mg  once daily except 7.5mg  once daily Mondays, Wednesdays and Fridays  Current Anticoagulation Instructions: INR 2.4 Continue coumadin 5mg  once daily except 7.5mg   on Mondays, Wednesdays and Fridays

## 2010-05-18 NOTE — Progress Notes (Signed)
Summary: Medication Question   Phone Note Call from Patient   Caller: Patient Reason for Call: Talk to Nurse Summary of Call: Patient was instructed to not take certain medicine if HR was less than 50 / can't remember what medicine that was / please return phone call/tg Initial call taken by: Raechel Ache [facility],  May 08, 2010 8:39 AM  Follow-up for Phone Call        S: Pt's HR has gotten as low as 47 in the past couple of days. B: Pt. states that on hosp. discharge in 5-11 Dr. Sherrie Mustache told her not to take one of her medications if her HR drops below 55 but pt. cant remember which one it was. Pt. takes Diovan 160mg  once daily, Bisprolol fumarate 10mg  once daily and Diltiazem 240mg  once daily. A: Pt. denies CP, SOB, dizziness, etc. She states that her HR has been going up and down all weekend and is concerned. R: Pt. advised that we will contact her with Dr. Marvel Plan recommendations, if any.  Follow-up by: Larita Fife Via LPN,  May 08, 2010 5:09 PM  Additional Follow-up for Phone Call Additional follow up Details #1::        In the absence of symptoms, Ms. Youse does not need to follow her heart rate nor change the medicines that she is taking.  Please ask her to come to office for rhythm strip and vital signs. Additional Follow-up by: Kathlen Brunswick, MD, [facility],  May 11, 2010 9:28 AM    Additional Follow-up for Phone Call Additional follow up Details #2::    Pt is scheduled for a coumadin visit on 05/22/10, she will have a rhythm strip at that time, and will bring in a list of her heart rates, she will not make a separate nurse visit due to cost Follow-up by: Teressa Lower RN,  May 11, 2010 2:39 PM

## 2010-05-18 NOTE — Miscellaneous (Signed)
Summary: LABS CNP,CBCD,LIPIDS,01/26/2010  Clinical Lists Changes  Observations: Added new observation of CALCIUM: 9.4 mg/dL (04/54/0981 19:14) Added new observation of ALBUMIN: 4.0 g/dL (78/29/5621 30:86) Added new observation of PROTEIN, TOT: 7.6 g/dL (57/84/6962 95:28) Added new observation of SGPT (ALT): 19 units/L (01/26/2010 10:47) Added new observation of SGOT (AST): 21 units/L (01/26/2010 10:47) Added new observation of ALK PHOS: 98 units/L (01/26/2010 10:47) Added new observation of GFR AA: >60 mL/min/1.38m2 (01/26/2010 10:47) Added new observation of GFR: >60 mL/min (01/26/2010 10:47) Added new observation of CREATININE: 0.90 mg/dL (41/32/4401 02:72) Added new observation of BUN: 17 mg/dL (53/66/4403 47:42) Added new observation of BG RANDOM: 107 mg/dL (59/56/3875 64:33) Added new observation of CO2 PLSM/SER: 27 meq/L (01/26/2010 10:47) Added new observation of CL SERUM: 104 meq/L (01/26/2010 10:47) Added new observation of K SERUM: 4.4 meq/L (01/26/2010 10:47) Added new observation of NA: 141 meq/L (01/26/2010 10:47) Added new observation of LDL: 101 mg/dL (29/51/8841 66:06) Added new observation of HDL: 41 mg/dL (30/16/0109 32:35) Added new observation of TRIGLYC TOT: 142 mg/dL (57/32/2025 42:70) Added new observation of CHOLESTEROL: 170 mg/dL (62/37/6283 15:17) Added new observation of ABSOLUTE BAS: 0.0 K/uL (01/26/2010 10:47) Added new observation of BASOPHIL %: 1 % (01/26/2010 10:47) Added new observation of EOS ABSLT: 0.1 K/uL (01/26/2010 10:47) Added new observation of % EOS AUTO: 1 % (01/26/2010 10:47) Added new observation of ABSOLUTE MON: 0.7 K/uL (01/26/2010 10:47) Added new observation of MONOCYTE %: 8 % (01/26/2010 10:47) Added new observation of ABS LYMPHOCY: 1.7 K/uL (01/26/2010 10:47) Added new observation of LYMPHS %: 21 % (01/26/2010 10:47) Added new observation of PLATELETK/UL: 208 K/uL (01/26/2010 10:47) Added new observation of RDW: 14.3 % (01/26/2010  10:47) Added new observation of MCHC RBC: 33.0 g/dL (61/60/7371 06:26) Added new observation of MCV: 87.8 fL (01/26/2010 10:47) Added new observation of HCT: 46.6 % (01/26/2010 10:47) Added new observation of HGB: 15.4 g/dL (94/85/4627 03:50) Added new observation of RBC M/UL: 5.31 M/uL (01/26/2010 10:47) Added new observation of WBC COUNT: 8.1 10*3/microliter (01/26/2010 10:47)

## 2010-05-18 NOTE — Medication Information (Signed)
Summary: ccr-lr  Anticoagulant Therapy  Managed by: Vashti Hey, RN PCP: Wallace Going Supervising MD: Dietrich Pates MD, Molly Maduro Indication 1: Atrial Fibrillation Lab Used: [facility] [place] Site:  INR POC 2.9  Dietary changes: no    Health status changes: no    Bleeding/hemorrhagic complications: no    Recent/future hospitalizations: no    Any changes in medication regimen? no    Recent/future dental: no  Any missed doses?: no       Is patient compliant with meds? yes       Allergies: No Known Drug Allergies  Anticoagulation Management History:      The patient is taking warfarin and comes in today for a routine follow up visit.  Warfarin therapy is being given due to Atrial Fibrillation .  Positive risk factors for bleeding include an age of 39 years or older.  Negative risk factors for bleeding include no history of CVA/TIA, no history of GI bleeding, and absence of serious comorbidities.  The bleeding index is 'intermediate risk'.  Positive CHADS2 values include History of CHF, History of HTN, and Age > 14 years old.  Negative CHADS2 values include History of Diabetes and Prior Stroke/CVA/TIA.  The start date was 09/01/2009.  Anticoagulation responsible provider: Dietrich Pates MD, Molly Maduro.  INR POC: 2.9.  Cuvette Lot#: [id number].    Anticoagulation Management Assessment/Plan:      The patient's current anticoagulation dose is Warfarin sodium 5 mg tabs: Take 1 tablet daily except 1 1/2 tablets on M,W,F or as directed by Anticoagulation Clinic.  The target INR is 2.0-3.0.  The next INR is due 05/22/2010.  Anticoagulation instructions were given to patient.  Results were reviewed/authorized by Vashti Hey, RN.  She was notified by Vashti Hey RN.         Prior Anticoagulation Instructions: INR 2.4 Continue coumadin 5mg  once daily except 7.5mg  on Mondays, Wednesdays and Fridays  Current Anticoagulation Instructions: INR 2.9 Continue coumadin 5mg  once daily except 7.5mg   on Mondays, Wednesdays and Fridays

## 2010-05-18 NOTE — Assessment & Plan Note (Signed)
Summary: 6 mth f/u per checkout on 09/20/09/tg      Allergies Added: NKDA  Visit Type:  Follow-up Primary Provider:  Dr.Golding   History of Present Illness: [name] returns to the office as scheduled for continued assessment and treatment of coronary artery disease and atrial fibrillation.  Since her last visit, she has done superbly.  She denies chest discomfort, dyspnea, orthopnea, PND, lightheadedness or syncope.  She experiences transient vertigo with changes in position and has had mild pedal edema that has not been troublesome to her.  She remains active, doing all of her housework as well as helping to raise a 43 month old grandson.  She's had no recent new medical problems and has not required urgent medical care.     Current Medications (verified): 1)  Diovan 160 Mg  Tabs (Valsartan) .... Take 1 Tablet By Mouth Once A Day 2)  Bisoprolol Fumarate 10 Mg Tabs (Bisoprolol Fumarate) .... Take 2  Tablets By Mouth Daily 3)  Warfarin Sodium 5 Mg Tabs (Warfarin Sodium) .... Take 1 Tablet Daily Except 1 1/2 Tablets On M,w,f or As Directed By Anticoagulation Clinic 4)  Diltiazem Hcl Er Beads 240 Mg Xr24h-Cap (Diltiazem Hcl Er Beads) .... Take 1 Tablet By Mouth Once Daily 5)  Furosemide 80 Mg Tabs (Furosemide) .... Take 1 Tab Two Times A Day 6)  Lipitor 40 Mg Tabs (Atorvastatin Calcium) .... Take 1 Tab Daily 7)  K-Lor 20 Meq Pack (Potassium Chloride) .... Take 1 Tab Two Times A Day  Allergies (verified): No Known Drug Allergies  Comments:  Nurse/Medical Assistant: patient reviewed med list from previous ov and stated all med are the same  the only change is oxygen has been stopped  Past History:  PMH, FH, and Social History reviewed and updated.  Past Medical History: ASCVD: 3 vessel stenting in 05/2001 CEREBROVASCULAR DISEASE: Right carotid endarterectomy in 05/2004 Hyperlipidemia HYPERTENSION (ICD-401.9) Peripheral Vascular Disease: Status post bilateral renal  artery stents Tobacco abuse-40 pack years, discontinued in 1989    Review of Systems       See history of present illness.  Vital Signs:  Patient profile:   77 year old female Weight:      197 pounds O2 Sat:      97 % on Room air Pulse rate:   84 / minute Pulse (ortho):   73 / minute BP sitting:   135 / 75  (right arm) BP standing:   143 / 78  Vitals Entered By: Dreama Saa, CNA (April 05, 2010 10:54 AM)  O2 Flow:  Room air  Serial Vital Signs/Assessments:  Time      Position  BP       Pulse  Resp  Temp     By 11:09 AM  Lying LA  151/78   83                    Tammy Sanders RN 11:09 AM  Sitting   136/74   76                    Tammy Sanders RN 11:09 AM  Standing  143/78   73                    Tammy Sanders RN  Comments: 11:09 AM dizzy while lying no other complaints By: Teressa Lower RN    Physical Exam  General:  Overweight; well developed; no acute distress:   Neck-No JVD;  no carotid bruits: Lungs-No tachypnea, no rales; no rhonchi; no wheezes: decreased breath sounds at the bases Cardiovascular-normal PMI; normal S1 and S2; modest early systolic ejection murmur;  Abdomen-BS normal; soft and non-tender without masses or organomegaly:  Musculoskeletal-No deformities, no cyanosis or clubbing: Neurologic-Normal cranial nerves; symmetric strength and tone:  Skin-Warm, no significant lesions: Extremities-Nl distal pulses; no edema; varicose veins; trace ankle edema;     Impression & Recommendations:  Problem # 1:  ATHEROSCLEROTIC CARDIOVASCULAR DISEASE (ICD-429.2) Patient has been stable with respect coronary disease since a complex PCI/stent procedure in 2003.  Our focus has been on optimal control of cardiovascular risk factors, which has been successful to date.  Problem # 2:  PERIPHERAL VASCULAR DISEASE (ICD-443.9) Renal artery stenting appears to have been successful, or at least not deleterious.  Blood pressure control has been  good.  Cerebrovascular disease is followed by Dr. Edilia Bo, who has assessed her anatomy with ultrasound at least on an annual basis.  Those results are not available to me.  Episodic dizziness sounds more like vertigo than orthostatic hypotension.  Symptoms are mild and should be controllable with behavioral measures such as attention to gradual change in position.  These were discussed with Ms. Jhaveri.  Problem # 3:  ATRIAL FIBRILLATION (ICD-427.31) Recent onset of atrial fibrillation with good control of heart rate and symptoms.  She will require anticoagulation of indefinite duration.  CBC and stool for Hemoccult testing will be monitored to exclude the possibility of GI blood loss.  Problem # 4:  PERIPHERAL EDEMA (ICD-782.3) Peripheral edema is not causing any significant problems at present and will be treated conservatively with salt restriction and leg elevation.  Other Orders: Hemoccult Cards (Take Home) (Hemoccult Cards)  Patient Instructions: 1)  Your physician recommends that you schedule a follow-up appointment in: 9 months 2)  Your physician recommends that you continue on your current medications as directed. Please refer to the Current Medication list given to you today. 3)  Your physician has requested that you limit the intake of sodium (salt) in your diet to two grams daily. Please see [facility] handout. 4)  Your physician encouraged you to lose weight for better health. 5)  Leg elevation 6)  Your physician has asked that you test your stool for blood. It is necessary to test 3 different stool specimens for accuracy. You will be given 3 hemoccult cards for specimen collection. For each stool specimen, place a small portion of stool sample (from 2 different areas of the stool) into the 2 squares on the card. Close card. Repeat with 2 more stool specimens. Bring the cards back to the office for testing.

## 2010-05-22 ENCOUNTER — Encounter: Payer: Self-pay | Admitting: Cardiology

## 2010-05-22 ENCOUNTER — Encounter (INDEPENDENT_AMBULATORY_CARE_PROVIDER_SITE_OTHER): Payer: Medicare Other

## 2010-05-22 DIAGNOSIS — Z7901 Long term (current) use of anticoagulants: Secondary | ICD-10-CM

## 2010-05-22 DIAGNOSIS — I4891 Unspecified atrial fibrillation: Secondary | ICD-10-CM

## 2010-05-22 LAB — CONVERTED CEMR LAB: POC INR: 2.7

## 2010-05-23 ENCOUNTER — Encounter: Payer: Self-pay | Admitting: Cardiology

## 2010-06-01 NOTE — Medication Information (Signed)
Summary: [facility]  Anticoagulant Therapy Managed by: Vashti Hey, RN Patient Assessment Part 2:  Have you MISSED ANY DOSES or CHANGED TABLETS?  0  Have you had any BRUISING or BLEEDING ( nose or gum bleeds,blood in urine or stool)?  Have you STARTED or STOPPED any MEDICATIONS, including OTC meds,herbals or supplements?  Have you CHANGED your DIET, especially green vegetables,or ALCOHOL intake?  Have you had any ILLNESSES or HOSPITALIZATIONS?  Have you had any signs of CLOTTING?(chest discomfort,dizziness,shortness of breath,arms tingling,slurred speech,swelling or redness in leg)       Regimen Out:    Total Weekly: 42.50 mg mg  Next INR Due: 06/21/2010      Allergies: No Known Drug Allergies  Anticoagulant Therapy  Managed by: Vashti Hey, RN PCP: Dr.Golding Supervising MD: Dietrich Pates MD, Molly Maduro Indication 1: Atrial Fibrillation Lab Used: [facility] [place] Site: [place] INR POC 2.7  Dietary changes: no    Health status changes: no    Bleeding/hemorrhagic complications: no    Recent/future hospitalizations: no    Any changes in medication regimen? no    Recent/future dental: no  Any missed doses?: no       Is patient compliant with meds? yes         Anticoagulation Management History:      The patient is taking warfarin and comes in today for a routine follow up visit.  Anticoagulation is being administered due to Atrial Fibrillation .  Positive risk factors for bleeding include an age of 75 years or older.  Negative risk factors for bleeding include no history of CVA/TIA, no history of GI bleeding, and absence of serious comorbidities.  The bleeding index is 'intermediate risk'.  Positive CHADS2 values include History of CHF, History of HTN, and Age > 56 years old.  Negative CHADS2 values include History of Diabetes and Prior Stroke/CVA/TIA.  The start date was 09/01/2009.  Anticoagulation responsible provider: Dietrich Pates MD, Molly Maduro.  INR POC:  2.7.  Cuvette Lot#: [id number].    Anticoagulation Management Assessment/Plan:      The patient's current anticoagulation dose is Warfarin sodium 5 mg tabs: Take 1 tablet daily except 1 1/2 tablets on M,W,F or as directed by Anticoagulation Clinic.  The target INR is 2.0-3.0.  The next INR is due 06/21/2010.  Anticoagulation instructions were given to patient.  Results were reviewed/authorized by Vashti Hey, RN.  She was notified by Vashti Hey RN.         Prior Anticoagulation Instructions: INR 2.9 Continue coumadin 5mg  once daily except 7.5mg  on Mondays, Wednesdays and Fridays  Current Anticoagulation Instructions: INR 2.7 Continue coumadin 5mg  once daily except 7.5mg  on Mondays, Wednesdays and Fridays

## 2010-06-07 NOTE — Assessment & Plan Note (Signed)
**Note De-Identified [name] Obfuscation** Summary: slow HR  Nurse Visit   Vital Signs:  Patient profile:   77 year old female Pulse rate:   80 / minute BP sitting:   137 / 78  (left arm)  Vitals Entered By: Larita Fife Andalyn Heckstall LPN (May 23, 2010 3:48 PM)  Primary Provider:  Dr.Golding   History of Present Illness: S: Pt. arrives in office for bp check and a rhythm strip. B: While pt. was checking her O2 sat she noticed her HR was low and continued to monitor. She noticed that her HR was remaining low so she called office. Per Dr. Dietrich Pates, pt. was asked to come in today for rhythm strip and vital signs. A: Pt. has no complaints at this time and states that her HR checks at home are much improved. Her HR today is 80. Pt. advised that she does not need to monitor her HR unless she is having s/s. She states she understands. R: Pt. advised that we will contact her with Dr. Marvel Plan recommendations, if any.  05/28/10 Rhythm strip demonstrates atrial fibrillation with good control of heart rate. Agree with advice of Ms. Verity Gilcrest.  [place] Bing, M.D.  Pt. advised.  Larita Fife Clay Menser LPN  May 29, 2010 9:49 AM     Allergies: No Known Drug Allergies

## 2010-06-21 ENCOUNTER — Encounter: Payer: Self-pay | Admitting: Cardiovascular Disease

## 2010-06-21 ENCOUNTER — Encounter (INDEPENDENT_AMBULATORY_CARE_PROVIDER_SITE_OTHER): Payer: Medicare Other

## 2010-06-21 ENCOUNTER — Ambulatory Visit (INDEPENDENT_AMBULATORY_CARE_PROVIDER_SITE_OTHER): Payer: Medicare Other

## 2010-06-21 DIAGNOSIS — I4891 Unspecified atrial fibrillation: Secondary | ICD-10-CM

## 2010-06-21 DIAGNOSIS — Z7901 Long term (current) use of anticoagulants: Secondary | ICD-10-CM

## 2010-06-21 DIAGNOSIS — I959 Hypotension, unspecified: Secondary | ICD-10-CM

## 2010-06-27 NOTE — Medication Information (Signed)
Summary: ccr-lr  Anticoagulant Therapy  Managed by: Vashti Hey, RN PCP: Wallace Going Supervising MD: Eden Emms MD, Theron Arista Indication 1: Atrial Fibrillation Lab Used: [facility] [place] Site: [place] INR POC 2.2  Dietary changes: no    Health status changes: no    Bleeding/hemorrhagic complications: no    Recent/future hospitalizations: no    Any changes in medication regimen? no    Recent/future dental: no  Any missed doses?: no       Is patient compliant with meds? yes       Allergies: No Known Drug Allergies  Anticoagulation Management History:      The patient is taking warfarin and comes in today for a routine follow up visit.  Anticoagulation is being administered due to Atrial Fibrillation .  Positive risk factors for bleeding include an age of 75 years or older.  Negative risk factors for bleeding include no history of CVA/TIA, no history of GI bleeding, and absence of serious comorbidities.  The bleeding index is 'intermediate risk'.  Positive CHADS2 values include History of CHF, History of HTN, and Age > 37 years old.  Negative CHADS2 values include History of Diabetes and Prior Stroke/CVA/TIA.  The start date was 09/01/2009.  Anticoagulation responsible provider: Eden Emms MD, Theron Arista.  INR POC: 2.2.  Cuvette Lot#: [id number].    Anticoagulation Management Assessment/Plan:      The patient's current anticoagulation dose is Warfarin sodium 5 mg tabs: Take 1 tablet daily except 1 1/2 tablets on M,W,F or as directed by Anticoagulation Clinic.  The target INR is 2.0-3.0.  The next INR is due 07/19/2010.  Anticoagulation instructions were given to patient.  Results were reviewed/authorized by Vashti Hey, RN.  She was notified by Vashti Hey RN.         Prior Anticoagulation Instructions: INR 2.7 Continue coumadin 5mg  once daily except 7.5mg  on Mondays, Wednesdays and Fridays  Current Anticoagulation Instructions: INR 2.2 Continue coumadin 5mg  once daily except 7.5mg   on Mondays, Wednesdays and Fridays

## 2010-07-03 ENCOUNTER — Encounter: Payer: Self-pay | Admitting: Cardiology

## 2010-07-03 LAB — HEPATIC FUNCTION PANEL
ALT: 27 U/L (ref 0–35)
Albumin: 3.4 g/dL — ABNORMAL LOW (ref 3.5–5.2)
Alkaline Phosphatase: 88 U/L (ref 39–117)
Total Protein: 6.7 g/dL (ref 6.0–8.3)

## 2010-07-03 LAB — DIFFERENTIAL
Basophils Absolute: 0.1 10*3/uL (ref 0.0–0.1)
Basophils Relative: 0 % (ref 0–1)
Basophils Relative: 0 % (ref 0–1)
Basophils Relative: 1 % (ref 0–1)
Eosinophils Absolute: 0.1 10*3/uL (ref 0.0–0.7)
Eosinophils Relative: 1 % (ref 0–5)
Eosinophils Relative: 1 % (ref 0–5)
Lymphocytes Relative: 13 % (ref 12–46)
Lymphs Abs: 1.6 10*3/uL (ref 0.7–4.0)
Monocytes Absolute: 0.9 10*3/uL (ref 0.1–1.0)
Monocytes Absolute: 1.1 10*3/uL — ABNORMAL HIGH (ref 0.1–1.0)
Monocytes Absolute: 1.3 10*3/uL — ABNORMAL HIGH (ref 0.1–1.0)
Monocytes Relative: 10 % (ref 3–12)
Neutro Abs: 8.4 10*3/uL — ABNORMAL HIGH (ref 1.7–7.7)
Neutrophils Relative %: 74 % (ref 43–77)
Neutrophils Relative %: 74 % (ref 43–77)

## 2010-07-03 LAB — HEMOGLOBIN A1C: Hgb A1c MFr Bld: 6.1 % — ABNORMAL HIGH (ref <5.7)

## 2010-07-03 LAB — CBC
HCT: 39.6 % (ref 36.0–46.0)
HCT: 42 % (ref 36.0–46.0)
Hemoglobin: 14.1 g/dL (ref 12.0–15.0)
MCHC: 33.9 g/dL (ref 30.0–36.0)
MCHC: 34.4 g/dL (ref 30.0–36.0)
MCHC: 34.7 g/dL (ref 30.0–36.0)
MCV: 85.7 fL (ref 78.0–100.0)
MCV: 85.8 fL (ref 78.0–100.0)
Platelets: 178 10*3/uL (ref 150–400)
RBC: 4.61 MIL/uL (ref 3.87–5.11)
RDW: 13.9 % (ref 11.5–15.5)
WBC: 11.4 10*3/uL — ABNORMAL HIGH (ref 4.0–10.5)

## 2010-07-03 LAB — BASIC METABOLIC PANEL
CO2: 22 mEq/L (ref 19–32)
CO2: 23 mEq/L (ref 19–32)
Calcium: 8.9 mg/dL (ref 8.4–10.5)
Chloride: 104 mEq/L (ref 96–112)
Chloride: 104 mEq/L (ref 96–112)
Chloride: 106 mEq/L (ref 96–112)
Creatinine, Ser: 0.74 mg/dL (ref 0.4–1.2)
Creatinine, Ser: 0.76 mg/dL (ref 0.4–1.2)
GFR calc Af Amer: >60 mL/min (ref >60)
GFR calc Af Amer: >60 mL/min (ref >60)
GFR calc Af Amer: >60 mL/min (ref >60)
GFR calc Af Amer: >60 mL/min (ref >60)
GFR calc non Af Amer: >60 mL/min (ref >60)
GFR calc non Af Amer: >60 mL/min (ref >60)
Glucose, Bld: 108 mg/dL — ABNORMAL HIGH (ref 70–99)
Glucose, Bld: 112 mg/dL — ABNORMAL HIGH (ref 70–99)
Potassium: 3.4 mEq/L — ABNORMAL LOW (ref 3.5–5.1)
Sodium: 137 mEq/L (ref 135–145)
Sodium: 138 mEq/L (ref 135–145)
Sodium: 139 mEq/L (ref 135–145)

## 2010-07-03 LAB — URINALYSIS, ROUTINE W REFLEX MICROSCOPIC
Bilirubin Urine: NEGATIVE
Hgb urine dipstick: NEGATIVE
Urobilinogen, UA: 0.2 mg/dL (ref 0.0–1.0)
pH: 5 (ref 5.0–8.0)

## 2010-07-03 LAB — POCT CARDIAC MARKERS
CKMB, poc: 2.1 ng/mL (ref 1.0–8.0)
Myoglobin, poc: 76.5 ng/mL (ref 12–200)

## 2010-07-03 LAB — BLOOD GAS, ARTERIAL
Bicarbonate: 26.6 mEq/L — ABNORMAL HIGH (ref 20.0–24.0)
FIO2: 0.21 %
TCO2: 23.5 mmol/L (ref 0–100)
pCO2 arterial: 39 mmHg (ref 35.0–45.0)
pH, Arterial: 7.449 — ABNORMAL HIGH (ref 7.350–7.400)

## 2010-07-03 LAB — CARDIAC PANEL(CRET KIN+CKTOT+MB+TROPI)
CK, MB: 2 ng/mL (ref 0.3–4.0)
CK, MB: 2.4 ng/mL (ref 0.3–4.0)
Total CK: 69 U/L (ref 7–177)

## 2010-07-03 LAB — T4, FREE: Free T4: 1.39 ng/dL (ref 0.80–1.80)

## 2010-07-03 LAB — BRAIN NATRIURETIC PEPTIDE
Pro B Natriuretic peptide (BNP): 190 pg/mL — ABNORMAL HIGH (ref 0.0–100.0)
Pro B Natriuretic peptide (BNP): 209 pg/mL — ABNORMAL HIGH (ref 0.0–100.0)

## 2010-07-03 LAB — LIPID PANEL
LDL Cholesterol: 48 mg/dL (ref 0–99)
Triglycerides: 67 mg/dL (ref <150)

## 2010-07-03 LAB — PROTIME-INR
INR: 1.14 (ref 0.00–1.49)
INR: 1.72 — ABNORMAL HIGH (ref 0.00–1.49)
Prothrombin Time: 20 seconds — ABNORMAL HIGH (ref 11.6–15.2)

## 2010-07-03 LAB — TSH: TSH: 1.455 u[IU]/mL (ref 0.350–4.500)

## 2010-07-04 NOTE — Assessment & Plan Note (Signed)
**Note De-Identified Sylvain Hasten Obfuscation** Summary: nurse visit per Tammy w/rhythm strip and bp check/tg      Allergies Added: NKDA  Primary Provider:  Dr.Golding   History of Present Illness: S: Pt. arrives in office for rhythm strip and BP check with nurse B: On 05-23-10 pt. called concerned about low heart rate and was advised to have a rhythm strip and BP check on next [facility] visit. A: Pt. states that her HR is staying in the 70's and 80's and has no complaints at this time. Rhythm strip obtained and placed in Dr. Langston Masker reports folder for him to view. HR is 80 today. R: Pt. advised that we will contact her with Dr. Marvel Plan recommendations, if any  06/25/2010  Continue current therapy.  [place] Bing, M.D.   Pt. advised. She states she understands.    Larita Fife Kimberely Mccannon LPN  June 26, 2010 9:21 AM   Current Medications (verified): 1)  Diovan 160 Mg  Tabs (Valsartan) .... Take 1 Tablet By Mouth Once A Day 2)  Bisoprolol Fumarate 10 Mg Tabs (Bisoprolol Fumarate) .... Take 2  Tablets By Mouth Daily 3)  Warfarin Sodium 5 Mg Tabs (Warfarin Sodium) .... Take 1 Tablet Daily Except 1 1/2 Tablets On M,w,f or As Directed By Anticoagulation Clinic 4)  Diltiazem Hcl Er Beads 240 Mg Xr24h-Cap (Diltiazem Hcl Er Beads) .... Take 1 Tablet By Mouth Once Daily 5)  Furosemide 80 Mg Tabs (Furosemide) .... Take 1 Tab Two Times A Day 6)  Lipitor 40 Mg Tabs (Atorvastatin Calcium) .... Take 1 Tab Daily 7)  K-Lor 20 Meq Pack (Potassium Chloride) .... Take 1 Tab Two Times A Day  Allergies (verified): No Known Drug Allergies  Vital Signs:  Patient profile:   77 year old female Height:      60 inches Weight:      197 pounds BMI:     38.61 Pulse rate:   80 / minute BP sitting:   136 / 75  (left arm)

## 2010-07-14 ENCOUNTER — Encounter: Payer: Self-pay | Admitting: Cardiology

## 2010-07-14 DIAGNOSIS — I4891 Unspecified atrial fibrillation: Secondary | ICD-10-CM

## 2010-07-14 DIAGNOSIS — Z7901 Long term (current) use of anticoagulants: Secondary | ICD-10-CM

## 2010-07-19 ENCOUNTER — Ambulatory Visit (INDEPENDENT_AMBULATORY_CARE_PROVIDER_SITE_OTHER): Payer: Medicare Other | Admitting: *Deleted

## 2010-07-19 ENCOUNTER — Telehealth: Payer: Self-pay

## 2010-07-19 DIAGNOSIS — Z7901 Long term (current) use of anticoagulants: Secondary | ICD-10-CM

## 2010-07-19 DIAGNOSIS — I4891 Unspecified atrial fibrillation: Secondary | ICD-10-CM

## 2010-07-19 LAB — POCT INR: INR: 2.7

## 2010-07-20 ENCOUNTER — Telehealth: Payer: Self-pay

## 2010-07-20 NOTE — Telephone Encounter (Addendum)
S: Pt. c/o swelling in ankles and legs. B: On last OV with Dr. Dietrich Pates on 04-05-10 pt. was advised to restrict salt in her diet and to elevate legs for peripheral edema. Also, pt. takes 80mg  of Furosemide bid. A: Pt. states she has cut the amount of salt in her diet considerably and she elevates her legs as often as she can but the swelling remains. R: Pt. advised that we will contact her with Dr. Marvel Plan recommendations.    07/22/2010 Schedule return visit-first available.                                                          [place] Bing, M.D.

## 2010-07-24 LAB — CBC
HCT: 46.1 % — ABNORMAL HIGH (ref 36.0–46.0)
MCHC: 33.9 g/dL (ref 30.0–36.0)
MCV: 88.3 fL (ref 78.0–100.0)
Platelets: 188 10*3/uL (ref 150–400)
Platelets: 193 10*3/uL (ref 150–400)
RDW: 13.3 % (ref 11.5–15.5)
WBC: 15 10*3/uL — ABNORMAL HIGH (ref 4.0–10.5)

## 2010-07-24 LAB — COMPREHENSIVE METABOLIC PANEL
AST: 22 U/L (ref 0–37)
Albumin: 4 g/dL (ref 3.5–5.2)
Calcium: 9.7 mg/dL (ref 8.4–10.5)
Creatinine, Ser: 0.8 mg/dL (ref 0.4–1.2)
GFR calc Af Amer: >60 mL/min (ref >60)

## 2010-07-24 LAB — URINE MICROSCOPIC-ADD ON

## 2010-07-24 LAB — URINALYSIS, ROUTINE W REFLEX MICROSCOPIC
Ketones, ur: NEGATIVE mg/dL
Nitrite: NEGATIVE
Specific Gravity, Urine: 1.01 (ref 1.005–1.030)
pH: 7 (ref 5.0–8.0)

## 2010-07-24 LAB — BASIC METABOLIC PANEL
BUN: 6 mg/dL (ref 6–23)
Calcium: 9 mg/dL (ref 8.4–10.5)
Creatinine, Ser: 0.61 mg/dL (ref 0.4–1.2)
GFR calc non Af Amer: >60 mL/min (ref >60)
Glucose, Bld: 135 mg/dL — ABNORMAL HIGH (ref 70–99)
Potassium: 3.7 mEq/L (ref 3.5–5.1)

## 2010-07-24 LAB — APTT: aPTT: 31 seconds (ref 24–37)

## 2010-07-24 LAB — ABO/RH: ABO/RH(D): O POS

## 2010-07-25 ENCOUNTER — Telehealth: Payer: Self-pay

## 2010-07-26 ENCOUNTER — Ambulatory Visit (INDEPENDENT_AMBULATORY_CARE_PROVIDER_SITE_OTHER): Payer: Medicare Other | Admitting: Physician Assistant

## 2010-07-26 ENCOUNTER — Encounter: Payer: Self-pay | Admitting: Physician Assistant

## 2010-07-26 DIAGNOSIS — I4891 Unspecified atrial fibrillation: Secondary | ICD-10-CM

## 2010-07-26 DIAGNOSIS — I872 Venous insufficiency (chronic) (peripheral): Secondary | ICD-10-CM | POA: Insufficient documentation

## 2010-07-26 DIAGNOSIS — I739 Peripheral vascular disease, unspecified: Secondary | ICD-10-CM

## 2010-07-26 DIAGNOSIS — I10 Essential (primary) hypertension: Secondary | ICD-10-CM

## 2010-07-26 DIAGNOSIS — R609 Edema, unspecified: Secondary | ICD-10-CM

## 2010-07-26 NOTE — Assessment & Plan Note (Signed)
Blood pressure is well controlled 

## 2010-07-26 NOTE — Assessment & Plan Note (Signed)
Patient has been given a prescription for TED hose. She is asked to elevate her legs during the day.

## 2010-07-26 NOTE — Patient Instructions (Signed)
Your physician recommends that you schedule a follow-up appointment in: 1 month Your physician recommends that you start a 2 gram a day sodium diet, please see handout given to you today Your physician recommends that you wear Compression stockings (on in morning, off in evening) and to elevate your legs while sitting

## 2010-07-26 NOTE — Assessment & Plan Note (Signed)
Patient has increase in her peripheral edema secondary to her chronic venous insufficiency. Have asked her to keep her at legs elevated as much as she can during the day. We've prescribed TED hose as well. We have also given her a 2 g sodium diet.

## 2010-07-26 NOTE — Assessment & Plan Note (Signed)
Patient has had previous bilateral carotid endarterectomies. She does have a right carotid bruit. She has follow-up with Dr. Durwin Nora in June.

## 2010-07-26 NOTE — Assessment & Plan Note (Signed)
Stable and controlled on Coumadin

## 2010-07-26 NOTE — Progress Notes (Signed)
HPI This is a very pleasant white female patient who comes in today complaining of worsening lower extremity edema. She has a history of chronic venous insufficiency but says her legs are becoming more swollen every day. She takes care of her great-grandchild several days a week and does a lot of standing and chasing. She tries to put her feet up when she sits down. They do go down in the morning and worsened as the day progresses. They do become achy.  Patient denies any chest pain palpitations dyspnea dyspnea on exertion dizziness or presyncope. She was told to limit her salt intake. She said she does not add salt at the table but she does cook with salt and does not read labels carefully.  No Known Allergies  Current Outpatient Prescriptions on File Prior to Visit  Medication Sig Dispense Refill  . warfarin (COUMADIN) 5 MG tablet Take by mouth as directed.          Past Medical History  Diagnosis Date  . ASCVD (arteriosclerotic cardiovascular disease)     3 vessel stenting in 05/2004  . Cerebrovascular disease     right carotid endarectomy in 05/2004  . Hypertension   . Hyperlipidemia   . PVD (peripheral vascular disease)     status post bilateral renal artery stents  . Tobacco abuse     40 pack year stopped in 1989    Past Surgical History  Procedure Date  . Angioplasty     1989 ADENO CL EF NL neg ischemia neg infarct  . Bilateral stenting 2004    renal arteries  . Cardiac catheterization     stent to RCA 60%stenosis mid LAD 60%2nd DM      History   Social History  . Marital Status: Married    Spouse Name: N/A    Number of Children: 4  . Years of Education: N/A   Occupational History  . housewife    Social History Main Topics  . Smoking status: Former Games developer  . Smokeless tobacco: Never Used  . Alcohol Use: No  . Drug Use: No  . Sexually Active:    Other Topics Concern  . Not on file   Social History Narrative  . No narrative on file    ROS: See  HPI Eyes: Negative Ears:Negative for hearing loss, tinnitus Cardiovascular: Negative for chest pain, palpitations,irregular heartbeat, dyspnea, dyspnea on exertion, near-syncope, orthopnea, paroxysmal nocturnal dyspnia and syncope,claudication, cyanosis,.  Respiratory:   Negative for cough, hemoptysis, shortness of breath, sleep disturbances due to breathing, sputum production and wheezing.   Endocrine: Negative for cold intolerance and heat intolerance.  Hematologic/Lymphatic: Negative for adenopathy and bleeding problem. Does not bruise/bleed easily.  Musculoskeletal: Negative.   Gastrointestinal: Negative for nausea, vomiting, reflux, abdominal pain, diarrhea, constipation.   Genitourinary: Negative for bladder incontinence, dysuria, flank pain, frequency, hematuria, hesitancy, nocturia and urgency.  Neurological: Negative.  Allergic/Immunologic: Negative for environmental allergies.   PHYSICAL EXAM Well-nournished, in no acute distress. Neck: right carotid bruit,No JVD, HJR,  or thyroid enlargement Lungs: No tachypnea, clear without wheezing, rales, or rhonchi Cardiovascular: RRR, PMI not displaced,2/6 systolic ejection murmur at the left sternal border, no gallops, bruit, thrill, or heave. Abdomen: BS normal. Soft without organomegaly, masses, lesions or tenderness. Extremities: +1-2 edema bilateral legs up to her knees. Chronic venous stasis changes.Decreased distal pulses bilateral SKin: Warm, no lesions or rashes  Musculoskeletal: No deformities Neuro: no focal signs  BP 122/65  Pulse 69  Ht 5\' 2"  (1.575 m)  Wt 197 lb (89.359 kg)  BMI 36.03 kg/m2  SpO2 92%  EKG:  ASSESSMENT AND PLAN:

## 2010-08-16 ENCOUNTER — Ambulatory Visit (INDEPENDENT_AMBULATORY_CARE_PROVIDER_SITE_OTHER): Payer: Medicare Other | Admitting: *Deleted

## 2010-08-16 DIAGNOSIS — Z7901 Long term (current) use of anticoagulants: Secondary | ICD-10-CM

## 2010-08-16 DIAGNOSIS — I4891 Unspecified atrial fibrillation: Secondary | ICD-10-CM

## 2010-08-28 ENCOUNTER — Ambulatory Visit (INDEPENDENT_AMBULATORY_CARE_PROVIDER_SITE_OTHER): Payer: Medicare Other | Admitting: Cardiology

## 2010-08-28 ENCOUNTER — Encounter: Payer: Self-pay | Admitting: *Deleted

## 2010-08-28 ENCOUNTER — Encounter: Payer: Self-pay | Admitting: Cardiology

## 2010-08-28 DIAGNOSIS — I679 Cerebrovascular disease, unspecified: Secondary | ICD-10-CM

## 2010-08-28 DIAGNOSIS — F17201 Nicotine dependence, unspecified, in remission: Secondary | ICD-10-CM

## 2010-08-28 DIAGNOSIS — Z7901 Long term (current) use of anticoagulants: Secondary | ICD-10-CM

## 2010-08-28 DIAGNOSIS — Z87891 Personal history of nicotine dependence: Secondary | ICD-10-CM

## 2010-08-28 DIAGNOSIS — I872 Venous insufficiency (chronic) (peripheral): Secondary | ICD-10-CM

## 2010-08-28 DIAGNOSIS — I4891 Unspecified atrial fibrillation: Secondary | ICD-10-CM

## 2010-08-28 DIAGNOSIS — I2510 Atherosclerotic heart disease of native coronary artery without angina pectoris: Secondary | ICD-10-CM

## 2010-08-28 DIAGNOSIS — I739 Peripheral vascular disease, unspecified: Secondary | ICD-10-CM

## 2010-08-28 DIAGNOSIS — I10 Essential (primary) hypertension: Secondary | ICD-10-CM

## 2010-08-28 DIAGNOSIS — I6529 Occlusion and stenosis of unspecified carotid artery: Secondary | ICD-10-CM | POA: Insufficient documentation

## 2010-08-28 DIAGNOSIS — E785 Hyperlipidemia, unspecified: Secondary | ICD-10-CM | POA: Insufficient documentation

## 2010-08-28 DIAGNOSIS — E669 Obesity, unspecified: Secondary | ICD-10-CM | POA: Insufficient documentation

## 2010-08-28 MED ORDER — ATORVASTATIN CALCIUM 40 MG PO TABS: 80.0000 mg | ORAL_TABLET | Freq: Every day | ORAL | Status: DC

## 2010-08-28 NOTE — Assessment & Plan Note (Addendum)
Patient has impressive varicosities around the knee and surface varicosities at the ankle.  I have suggested that Dr. Edilia Bo evaluate this issue at her next visit to determine if intervention is warranted.  She has looked into obtaining compression stockings, but was advised that knee-high models would not be helpful.  Thigh-high stockings would be difficult for her to manage.

## 2010-08-28 NOTE — Progress Notes (Signed)
HPI : Ms. Wafer returns to the office as scheduled for continued assessment and treatment of widespread vascular disease and cardiovascular risk factors.  Since her last visit, she has done superbly.  She has not required urgent medical care, has developed no new medical problems and has been free of cardiopulmonary symptoms.  Blood pressure control has been good.  She continues to be followed by Dr. Edilia Bo for cerebrovascular disease.  Current Outpatient Prescriptions on File Prior to Visit  Medication Sig Dispense Refill  . bisoprolol (ZEBETA) 10 MG tablet Take 10 mg by mouth 2 (two) times daily.        Marland Kitchen diltiazem (CARDIZEM CD) 240 MG 24 hr capsule Take 240 mg by mouth daily.        . furosemide (LASIX) 80 MG tablet Take 80 mg by mouth 2 (two) times daily.        . potassium chloride SA (K-DUR,KLOR-CON) 20 MEQ tablet Take 20 mEq by mouth 2 (two) times daily.        . valsartan (DIOVAN) 160 MG tablet Take 160 mg by mouth daily.        Marland Kitchen warfarin (COUMADIN) 5 MG tablet Take by mouth as directed.        Marland Kitchen DISCONTD: atorvastatin (LIPITOR) 40 MG tablet Take 40 mg by mouth daily.           No Known Allergies    Past medical history, social history, and family history reviewed and updated.  ROS: No orthopnea, PND, chronic cough, sputum production, lightheadedness or syncope.  PHYSICAL EXAM: BP 135/71  Pulse 62  Ht 5\' 2"  (1.575 m)  Wt 195 lb (88.451 kg)  BMI 35.67 kg/m2  SpO2 95% ; weight decreased 2 pounds since last year General-Well developed; no acute distress Body habitus- obese Neck-No JVD; modest left carotid bruits Lungs-clear lung fields; resonant to percussion; decreased breath sounds at the bases Cardiovascular-normal PMI; normal S1 and S2; grade 1-2/6 systolic ejection murmur Abdomen-normal bowel sounds; soft and non-tender without masses or organomegaly Musculoskeletal-No deformities, no cyanosis or clubbing Neurologic-Normal cranial nerves; symmetric strength and  tone Skin-Warm, no significant lesions Extremities-distal pulses-1+; marked varicosities; 1+ edema  ASSESSMENT AND PLAN:

## 2010-08-28 NOTE — Assessment & Plan Note (Signed)
Control of hyperlipidemia is good but not optimal.  Atorvastatin will be increased to 80 mg q.d. With a repeat lipid profile in one month.

## 2010-08-28 NOTE — Assessment & Plan Note (Signed)
Serial carotid ultrasounds and clinical followup by Dr. Edilia Bo.  Appointment is scheduled for later this year.

## 2010-08-28 NOTE — Assessment & Plan Note (Signed)
There have been no symptoms to suggest progression of coronary disease since intervention nearly 10 years ago.  Efforts to optimally controlled vascular risk factors will be continued.

## 2010-08-28 NOTE — Assessment & Plan Note (Signed)
Doing well with a rate control strategy and anticoagulation.

## 2010-08-28 NOTE — Patient Instructions (Signed)
Your physician recommends that you schedule a follow-up appointment in:1 year Your physician recommends that you return for lab work in: 1 month Your physician has recommended you make the following change in your medication:increase lipitor to 80mg  daily

## 2010-08-29 NOTE — Op Note (Signed)
NAMEJULINA, ALTMANN            ACCOUNT NO.:  [id number]   MEDICAL RECORD NO.:  [id number]          PATIENT TYPE:  INP   LOCATION:  3307                         FACILITY:  [facility]   PHYSICIAN:  Di Kindle. Edilia Bo, M.D.DATE OF BIRTH:  03-Mar-1933   DATE OF PROCEDURE:  09/29/2008  DATE OF DISCHARGE:                               OPERATIVE REPORT   PREOPERATIVE DIAGNOSIS:  Asymptomatic greater than 80% left carotid  stenosis.   POSTOPERATIVE DIAGNOSIS:  Asymptomatic greater than 80% left carotid  stenosis.   PROCEDURE:  Left carotid endarterectomy with Dacron patch angioplasty.   SURGEON:  Di Kindle. Edilia Bo, MD   ASSISTANT:  Wilmon Arms, PA   ANESTHESIA:  General.   INDICATIONS:  This is a 77 year old woman who had undergone a previous  right carotid endarterectomy in April 2005.  I had been following her  with moderate left carotid stenosis.  This progressed to greater than  80% and left carotid endarterectomy was recommended in order to lower  her risk of future stroke.  The procedure and potential complications  including but not limited to bleeding, MI, stroke (periprocedural risk 1-  2%), nerve injury were discussed with the patient.  All of her questions  were answered.  She was agreeable to proceed.   TECHNIQUE:  The patient was taken to the operating room and received a  general anesthetic.  The left neck was prepped and draped in the usual  sterile fashion.  An incision was made along the anterior border of the  sternocleidomastoid and the dissection carried down to the common  carotid artery which was dissected free and controlled with a Rumel  tourniquet.  Facial vein was divided between 2-0 silk ties.  The plaque  was identified and the internal carotid artery above the plaque was  controlled with a red vessel loop.  The external carotid artery and  superior thyroid arteries were also controlled and then the patient was  heparinized with 9000 units of  IV heparin.  Once this had circulated,  the clamps were then placed on the internal, then the common, and the  external carotid artery.  A longitudinal arteriotomy was made in the  common carotid artery and this was extended through the plaque into the  internal carotid artery above the plaque.  A 12-shunt was placed into  the internal carotid artery, back-bled, and then placed into the common  carotid artery and secured with a Rumel tourniquet.  Flow was  reestablished to the shunt.  Next, an endarterectomy plane was  established proximally and the plaque was sharply divided.  Eversion  endarterectomy was performed of the external carotid artery.  Distally,  there was a nice taper in the plaque and no tacking sutures were  required.  The artery was irrigated with copious amounts of dextran and  heparin.  All loose debris was removed.  The Dacron patch was then sewn  using continuous 6-0 Prolene suture.  Prior to completing the patch  closure, the shunt was removed, the artery back-bled and flushed  appropriately, and the anastomosis completed.  Flow was reestablished  first to the  external carotid artery and then to the internal carotid  artery.  At the completion, there was a good pulse distal to the patch  and a good Doppler signal.  Hemostasis was obtained in the wound.  The  heparin was partially reversed with protamine.  The wound was closed  with a deep layer of 3-0 Vicryl,  the platysma was closed with running 3-0 Vicryl, and the skin closed  with 4-0 subcuticular stitch.  A sterile dressing was applied.  The  patient tolerated the procedure well and was transferred to the recovery  room in satisfactory condition.  All needle and sponge counts were  correct.      Di Kindle. Edilia Bo, M.D.  Electronically Signed     CSD/MEDQ  D:  09/29/2008  T:  09/30/2008  Job:  [id number]   cc:   Patrica Duel, M.D.

## 2010-08-29 NOTE — Letter (Signed)
October 06, 2007    Patrica Duel, M.D.  [address]  [place], [place] [address]   RE:  CHANNING, SAVICH  MRN:  [id number]  /  DOB:  02-Jul-1932   Dear Loraine Leriche:   Ms. Fraiser returns to the office for continued assessment and  treatment of vascular disease including coronary artery disease and  vascular risk factors.  Since I last saw her approximately 15 months  ago, she has done superbly.  She has not developed any additional  medical issues.  She is being followed by Dr. Edilia Bo for  cerebrovascular disease with a 70% left carotid stenosis and a prior  right carotid endarterectomy.  Blood pressure control has been fairly  good with systolics around 140 and diastolics around 70.  Lipid control  was excellent on a recent profile.  Weight is slightly decreased.   CURRENT MEDICATIONS:  1. Dilantin 80 mg daily.  2. Bisoprolol 20 mg daily.  3. Caduet 10/40 mg daily.  4. Aspirin 81 mg daily.   PHYSICAL EXAMINATION:  GENERAL:  Pleasant overweight woman in no acute  distress.  VITAL SIGNS:  The weight is 200 pounds, 3 pounds less than last year.  Blood pressure 145/70, heart rate 60 and regular, respirations 14.  NECK:  No jugular venous distention; bilateral carotid bruits.  LUNGS:  Clear with somewhat decreased breath sounds at the bases.  CARDIAC:  Normal first heart sounds; increased intensity of the second  heart sound.  Slight systolic murmur.  ABDOMEN:  Soft and nontender; no bruits; no organomegaly.  EXTREMITIES:  Normal distal pulses; no edema.   LABORATORY DATA:  Normal CBC; excellent lipid profile with total  cholesterol of 123, triglycerides of 107, HDL 36, and LDL of 66.  Hepatic profile is normal.   IMPRESSION:  Ms. Barrantes is doing beautifully with respect to vascular  disease.  We will increase Diovan to further lower her systolic blood  pressure.  Her other medications are unchanged.  I will reassess this  nice woman in 1 year.     Sincerely,      Gerrit Friends. Dietrich Pates, MD, [facility]  Electronically Signed    RMR/MedQ  DD: 10/06/2007  DT: 10/07/2007  Job #: [id number]

## 2010-08-29 NOTE — Procedures (Signed)
CAROTID DUPLEX EXAM   INDICATION:  Followup carotid artery disease.   HISTORY:  Diabetes:  No.  Cardiac:  Stent, angioplasty.  Hypertension:  Yes.  Smoking:  Quit in 1989.  Previous Surgery:  Left carotid endarterectomy 09/29/2008, right carotid  endarterectomy 2005.  CV History:  No.  Amaurosis Fugax No, Paresthesias No, Hemiparesis No                                       RIGHT             LEFT  Brachial systolic pressure:         142               150  Brachial Doppler waveforms:         WNL               WNL  Vertebral direction of flow:        Antegrade         Antegrade  DUPLEX VELOCITIES (cm/sec)  CCA peak systolic                   76                71  ECA peak systolic                   102               151  ICA peak systolic                   83                123  ICA end diastolic                   24                34  PLAQUE MORPHOLOGY:                  Heterogeneous  PLAQUE AMOUNT:                      Mild  PLAQUE LOCATION:                    ICA   IMPRESSION:  1. 20%-39% stenosis of the right internal carotid artery status post      right endarterectomy (decreased from previous study).  2. No restenosis of the left internal carotid artery status post left      endarterectomy.  3. Antegrade flow in bilateral vertebrals.   ___________________________________________  Di Kindle. Edilia Bo, M.D.   CB/MEDQ  D:  04/07/2009  T:  04/07/2009  Job:  [id number]

## 2010-08-29 NOTE — Procedures (Signed)
CAROTID DUPLEX EXAM   INDICATION:  Follow-up carotid artery disease.   HISTORY:  Diabetes:  No.  Cardiac:  Stent 2003.  Hypertension:  Yes.  Smoking:  Quit.  Previous Surgery:  Right CEA with DPA 2005 by Dr. Edilia Bo.  CV History:  No.  Amaurosis Fugax No, Paresthesias No, Hemiparesis No.                                       RIGHT             LEFT  Brachial systolic pressure:         160               152  Brachial Doppler waveforms:         Biphasic          Biphasic  Vertebral direction of flow:        Antegrade         Antegrade  DUPLEX VELOCITIES (cm/sec)  CCA peak systolic                   81                85  ECA peak systolic                   101               264  ICA peak systolic                   138               444  ICA end diastolic                   32                91  PLAQUE MORPHOLOGY:                  Mixed             Mixed  PLAQUE AMOUNT:                      Mild              Moderate/severe  PLAQUE LOCATION:                    Bifurcation/ICA   ICA/ECA/CCA   IMPRESSION:  1. Right ICA shows evidence of 40-59% stenosis (low end of range), a      slight increase in velocity from previous study.  2. Left ICA shows evidence of 60-79% stenosis (top end of range), an      increase in velocity, however no category change.  3. Left ECA stenosis.   ___________________________________________  Di Kindle. Edilia Bo, M.D.   AS/MEDQ  D:  04/12/2008  T:  04/12/2008  Job:  [id number]

## 2010-08-29 NOTE — Procedures (Signed)
CAROTID DUPLEX EXAM   INDICATION:  Carotid disease.   HISTORY:  Diabetes:  No.  Cardiac:  Stent, angioplasty.  Hypertension:  Yes.  Smoking:  Previous.  Previous Surgery:  Left carotid endarterectomy on 09/29/2008, right  carotid endarterectomy in 2005.  CV History:  Currently asymptomatic.  Amaurosis Fugax No, Paresthesias No, Hemiparesis No.                                       RIGHT             LEFT  Brachial systolic pressure:         150               158  Brachial Doppler waveforms:         Normal            Normal  Vertebral direction of flow:        Antegrade         Antegrade  DUPLEX VELOCITIES (cm/sec)  CCA peak systolic                   62                80  ECA peak systolic                   98                184  ICA peak systolic                   103               113  ICA end diastolic                   21                19  PLAQUE MORPHOLOGY:                  Heterogenous  PLAQUE AMOUNT:                      Mild              None  PLAQUE LOCATION:                    ICA   IMPRESSION:  1. Patent right carotid endarterectomy site with no hemodynamically      significant stenosis of the right internal carotid artery.  2. Patent left carotid endarterectomy site with no hemodynamically      stenosis of the left internal carotid artery.  3. Mild intimal hyperplasia noted at the left bifurcation region.  4. No significant change noted when compared to the previous      examination on 04/07/2009.   ___________________________________________  Di Kindle. Edilia Bo, M.D.   CH/MEDQ  D:  10/06/2009  T:  10/06/2009  Job:  [id number]

## 2010-08-29 NOTE — Procedures (Signed)
CAROTID DUPLEX EXAM   INDICATION:  Followup of cerebrovascular disease.   HISTORY:  Diabetes:  No  Cardiac:  Coronary stent in 2003.  Hypertension:  Yes  Smoking:  Quit in 1989  Previous Surgery:  Right CEA with DPA 07/2003 by Dr. Edilia Bo.  CV History:  Amaurosis Fugax  No, Paresthesias  No, Hemiparesis No                                       RIGHT             LEFT  Brachial systolic pressure:         140               138  Brachial Doppler waveforms:         Within normal limits                   Within normal limits  Vertebral direction of flow:        Antegrade         Antegrade  DUPLEX VELOCITIES (cm/sec)  CCA peak systolic                   66                65  ECA peak systolic                   101               202  ICA peak systolic                   118               321  ICA end diastolic                   27                67  PLAQUE MORPHOLOGY:                  Irregular         Irregular  PLAQUE AMOUNT:                      Mild              Moderate - severe  PLAQUE LOCATION:                    Bifurcation/PICA  Bifurcation/PICA/ECA   IMPRESSION:  1. Right ICA status post CEA with 1-39% stenosis with mild plaquing      which is essentially unchanged.  2. Left ICA with a 60-79% stenosis which is unchanged.  3. Left ECA mild stenosis.  4. Bilateral vertebral arteries are within normal limits.   ___________________________________________  Di Kindle. Edilia Bo, M.D.   AR/MEDQ  D:  03/12/2007  T:  03/12/2007  Job:  [id number]

## 2010-08-29 NOTE — Discharge Summary (Signed)
[name], [name]            ACCOUNT NO.:  [id number]   MEDICAL RECORD NO.:  [id number]          PATIENT TYPE:  INP   LOCATION:  3307                         FACILITY:  [facility]   PHYSICIAN:  Di Kindle. Edilia Bo, M.D.DATE OF BIRTH:  [birth year]   DATE OF ADMISSION:  09/29/2008  DATE OF DISCHARGE:  09/30/2008                               DISCHARGE SUMMARY   REASON FOR ADMISSION:  Asymptomatic greater than 80% left carotid  stenosis.   HISTORY:  This is a 77 year old woman who had undergone previous right  carotid endarterectomy with Dacron patch angioplasty in April 2005.  I  have been following a moderate left carotid stenosis.  This progressed  to greater than 80% and left carotid endarterectomy was recommended in  order to lower her risk of future stroke.   Her past medical history was significant for hypertension,  hypercholesterolemia.  She also has history of coronary disease.  The  remainder of her history and physical is as dictated.   HOSPITAL COURSE:  The patient was admitted on September 29, 2008.  She  underwent a left carotid endarterectomy with Dacron patch angioplasty.  She did well and awoke neurologically intact.  She was monitored in the  recovery room where her blood pressure remained stable and she remained  neurologically intact.  She was transferred to the step-down unit.  By  postoperative day #1, she was ambulating, eating, and voiding without  difficulty.  Neurologic exam was intact and her incision looked fine.  Plans were discharge for home with followup in 2 weeks.   DISCHARGE MEDICATIONS:  1. Aspirin 325 mg p.o. daily.  2. Caduet 10/40 mg p.o. daily.  3. Bisoprolol 20 mg p.o. daily.  4. Diovan 160 mg p.o. daily.   ALLERGIES:  No known drug allergies.   CONDITION ON DISCHARGE:  Good.   Discharge is to home.   DISCHARGE DIAGNOSIS:  Asymptomatic greater than 80% left carotid  stenosis.   SECONDARY DIAGNOSES:  1. Coronary artery disease.  2.  Hypertension.  3. Hypercholesterolemia.   Follow up is in 2 weeks.      Di Kindle. Edilia Bo, M.D.  Electronically Signed     CSD/MEDQ  D:  09/30/2008  T:  09/30/2008  Job:  [id number]   cc:   Patrica Duel, M.D.

## 2010-08-29 NOTE — Procedures (Signed)
CAROTID DUPLEX EXAM   INDICATION:  Follow up of known carotid artery disease.   HISTORY:  Diabetes:  No.  Cardiac:  Stent in 2003.  Hypertension:  Yes.  Smoking:  Quit in 1989.  Previous Surgery:  Right carotid endarterectomy with Dacron patch  angioplasty in 2005 by Dr. Edilia Bo.  CV History:  No.  Amaurosis Fugax No, Paresthesias No, Hemiparesis No.                                       RIGHT             LEFT  Brachial systolic pressure:         166               170  Brachial Doppler waveforms:         Biphasic          Biphasic  Vertebral direction of flow:        Antegrade         Antegrade  DUPLEX VELOCITIES (cm/sec)  CCA peak systolic                   131               76  ECA peak systolic                   113               289  ICA peak systolic                   140               450  ICA end diastolic                   40                121  PLAQUE MORPHOLOGY:                  Heterogenous      Heterogenous  PLAQUE AMOUNT:                      Mild              Severe  PLAQUE LOCATION:                    BIF, ICA          BIF, ICA, ECA   IMPRESSION:  1. 40-59% right internal carotid artery restenosis.  2. 80-99% left internal carotid artery stenosis.       ___________________________________________  Di Kindle. Edilia Bo, M.D.   AC/MEDQ  D:  09/14/2008  T:  09/14/2008  Job:  [id number]

## 2010-08-29 NOTE — H&P (Signed)
HISTORY AND PHYSICAL EXAMINATION   September 14, 2008   Re:  [name], BOEHLKE                  DOB:  October 21, 1932   Date of planned admission 09/29/2008.   REASON FOR ADMISSION:  Greater than 80% left carotid stenosis.   This is a pleasant 77 year old woman who underwent a previous right  carotid endarterectomy with Dacron patch angioplasty in April of 2005.  I have been following her with a moderate left carotid stenosis.  On her  most recent followup duplex scan on 09/14/2008 it was noted that the  stenosis on the left had progressed to greater than 80%.  Peak systolic  velocity was 450 cm/sec with an end-diastolic velocity of 121 cm/sec.  She had a 40-59% right recurrent carotid stenosis.  Both vertebral  arteries were patent with normally directed flow.   The patient denies any history of stroke, TIAs, expressive or receptive  aphasia, or amaurosis fugax.   PAST MEDICAL HISTORY:  Is significant for hypertension and  hypercholesterolemia.  She also has a history of coronary disease.  She  underwent previous stent placement in the past.  She denies any history  of diabetes, congestive heart failure, COPD.   FAMILY HISTORY:  There is no history of premature cardiovascular  disease.   SOCIAL HISTORY:  She is married.  She quit tobacco in 1989.   ALLERGIES:  No known drug allergies.   MEDICATIONS:  1. Aspirin 325 mg p.o. daily.  2. Caduet 10/40 mg p.o. daily.  3. Bisoprolol 20 mg p.o. daily.  4. Diovan one p.o. daily.   REVIEW OF SYSTEMS:  GENERAL:  She has had no recent weight loss, weight  gain or problems with her appetite.  CARDIAC:  She has had no chest pain, chest pressure, palpitations or  arrhythmias.  She denies any significant dyspnea on exertion.  PULMONARY:  She has had no productive cough, bronchitis, asthma or  wheezing.  GI:  She has had no recent change in her bowel habits and has no history  of peptic ulcer disease.  GU:  She has had no  dysuria or frequency  VASCULAR:  She denies claudication, rest pain or nonhealing ulcers.  She  has had no history of DVT or phlebitis.  NEURO:  She has had no dizziness, blackouts, headaches or seizures.  ORTHO:  She has had no significant arthritis, joint pain or muscle pain.  ENT:  She has had no recent change in her eyesight or change in her  hearing.  HEMATOLOGIC:  She has had no bleeding problems or clotting disorders.   PHYSICAL EXAMINATION:  General:  This is a pleasant 77 year old woman  who appears her stated age.  Vital signs:  Her blood pressure is 153/82,  heart rate is 54.  HEENT:  Unremarkable.  Neck:  Supple.  She has soft  bilateral carotid bruits.  Lungs: Are clear bilaterally to auscultation.  Cardiac:  She has a regular rate and rhythm.  Abdomen:  Soft and  nontender.  She has normal pitched bowel sounds.  She has palpable  femoral pulses and warm, well-perfused feet.  She has no significant  lower extremity swelling.  Neurologic:  Exam is nonfocal.   Carotid duplex scan shows a greater than 80% left carotid stenosis.   I have recommended left carotid endarterectomy in order to lower her  risk of future stroke.  We have discussed the indications for surgery  and the  potential complications including but not limited to bleeding,  wound healing problems, stroke (periprocedural risk 1-2%), nerve injury,  MI or other unpredictable medical problems.  All of her questions were  answered.  She is agreeable to proceed.  Her surgery has been scheduled  for 09/29/2008.  She knows to continue her aspirin right up through  surgery.   Di Kindle. Edilia Bo, M.D.  Electronically Signed   CSD/MEDQ  D:  09/14/2008  T:  09/15/2008  Job:  [id number]   cc:   Patrica Duel, M.D.

## 2010-08-29 NOTE — Assessment & Plan Note (Signed)
OFFICE VISIT   [name], [name]  DOB:  1932-09-20                                       10/12/2008  VWUJW#:[id number]   I saw the patient in the office today for followup after her recent left  carotid endarterectomy.  She had undergone previous right carotid  endarterectomy and I have been following a moderate left carotid  stenosis.  This progressed to greater than 80% and I recommended left  carotid endarterectomy in order to lower her risk of future stroke.  She  underwent surgery on 09/29/2008 and did well postoperatively.  She was  discharged on postop day #1.  She returns for her first followup visit.  Since I saw her last her only complaint has been a mild headache.  She  has had no focal weakness or paresthesias.   PHYSICAL EXAMINATION:  Blood pressure is 147/73, heart rate is 62.  Her  incision is healing nicely.  Neurologic exam is nonfocal.  Lungs are  clear bilaterally to auscultation.   Overall I am pleased with her progress.  Her mild headaches appeared to  be improving gradually and I have encouraged her to take Advil as needed  for now.  We will see her back in 6 months with a followup carotid  duplex scan.  She knows to call sooner if she has problems.  In the  meantime she knows to continue taking her aspirin.   Di Kindle. Edilia Bo, M.D.  Electronically Signed   CSD/MEDQ  D:  10/12/2008  T:  10/13/2008  Job:  [id number]   cc:   Patrica Duel, M.D.

## 2010-08-29 NOTE — Procedures (Signed)
CAROTID DUPLEX EXAM   INDICATION:  Followup, carotid artery disease.   HISTORY:  Diabetes:  No.  Cardiac:  Coronary stent in 2003.  Hypertension:  Yes.  Smoking:  Quit.  Previous Surgery:  Right CEA with DPA in 2005 by Dr. Edilia Bo.  CV History:  No.  Amaurosis Fugax No, Paresthesias No, Hemiparesis No                                       RIGHT             LEFT  Brachial systolic pressure:         153               150  Brachial Doppler waveforms:         Biphasic          Biphasic  Vertebral direction of flow:        Antegrade         Antegrade  DUPLEX VELOCITIES (cm/sec)  CCA peak systolic                   75                52  ECA peak systolic                   103               221  ICA peak systolic                   108               377  ICA end diastolic                   29                69  PLAQUE MORPHOLOGY:                  Mixed             Mixed  PLAQUE AMOUNT:                      Mild              Moderate/severe  PLAQUE LOCATION:                    Bifurcation/ICA   ICA/ECA/CCA   IMPRESSION:  1. Right internal carotid artery shows evidence of 20-39% stenosis.  2. Left internal carotid artery shows evidence of 60-79% stenosis.  3. Left external carotid artery stenosis.  4. No significant changes from previous study.   ___________________________________________  Di Kindle. Edilia Bo, M.D.   AS/MEDQ  D:  10/07/2007  T:  10/07/2007  Job:  [id number]

## 2010-08-30 ENCOUNTER — Other Ambulatory Visit: Payer: Self-pay | Admitting: *Deleted

## 2010-08-30 MED ORDER — DILTIAZEM HCL ER COATED BEADS 240 MG PO CP24: 240.0000 mg | ORAL_CAPSULE | Freq: Every day | ORAL | Status: DC

## 2010-08-31 ENCOUNTER — Other Ambulatory Visit: Payer: Self-pay | Admitting: *Deleted

## 2010-08-31 MED ORDER — DILTIAZEM HCL ER COATED BEADS 240 MG PO CP24: 240.0000 mg | ORAL_CAPSULE | Freq: Every day | ORAL | Status: DC

## 2010-09-01 NOTE — Discharge Summary (Signed)
[place]  Patient:    [name], [name] Visit Number: [id number] MRN: [id number]          Service Type: CAT Location: [id number] Attending Physician:  Daisey Must Dictated by:   Brita Romp, P.A. Admit Date:  06/06/2001 Discharge Date: 06/07/2001                             Discharge Summary  ADDENDUM:  DISCHARGE DIAGNOSIS:  Renal artery stenosis.  CATHETERIZATION RESULTS: 1. Right renal artery:  50% stenosis. 2. Left renal artery:  80% stenosis. 3. Left iliac 30%.  ASSESSMENT AND RECOMMENDATION: Both the patients renal artery stenosis, as well as her carotid stenosis will be followed up by Maisie Fus C. Wall, M.D., in the office.  Daisey Must, M.D., has recommended delay of any carotid endarterectomy until at least a month post stent placement. Dictated by:   Brita Romp, P.A. Attending Physician:  Daisey Must DD:  06/07/01 TD:  06/08/01 Job: [id number]

## 2010-09-01 NOTE — H&P (Signed)
NAME:  [name], [name] NO.:  [id number]   MEDICAL RECORD NO.:  [id number]                   PATIENT TYPE:  INP   LOCATION:                                       FACILITY:  [facility]   PHYSICIAN:  Di Kindle. Edilia Bo, M.D.        DATE OF BIRTH:  1932/05/18   DATE OF ADMISSION:  07/22/2003  DATE OF DISCHARGE:                                HISTORY & PHYSICAL   REASON FOR ADMISSION:  Greater than 80% asymptomatic right carotid stenosis.   HISTORY OF PRESENT ILLNESS:  This is a pleasant 77 year old woman whom I had  been following in the office with bilateral carotid stenoses.  She came in  for an office visit on July 07, 2003, and at that time, the carotid  stenosis on the right had progressed to greater than 80%.  At that time, she  denied any history of stroke, TIA, expressive or receptive aphasia, or  amaurosis fugax.   ALLERGIES:  No known drug allergies.   MEDICATIONS:  1. Aspirin 325 mg p.o. daily.  2. Norvasc 10 mg p.o. daily.  3. Lipitor 20 mg p.o. daily.  4. Bisoprolol 10 mg p.o. daily (this patient was not exactly sure that this     was the correct name for this and she will bring the medication in on the     day of admission).  5. Diovan one p.o. daily (she will bring in the dose on the day of surgery).  6. Foltx one p.o. daily.   PAST MEDICAL HISTORY:  1. Coronary artery disease.  She has undergone previous stent placement.     She has had no recent chest pain and has no history of previous     myocardial infarction or history of congestive heart failure.  2. She does have a history of hypertension.  3. History of hypercholesterolemia.  4. She denies any history of diabetes or history of COPD.   FAMILY HISTORY:  There is no history of premature cardiovascular disease.   SOCIAL HISTORY:  She is a housewife.  She is married.  She quit tobacco in  1989.   REVIEW OF SYMPTOMS:  GENERAL:  She has had no weight loss, weight gain or  problems with her appetite.  CARDIAC:  She has had no chest pain, chest  pressure, orthopnea, dyspnea or palpitations.  PULMONARY:  She has had no  bronchitis, asthma, or wheezing.  GI:  She has had no recent change in her  bowel habits and has no history of peptic ulcer disease or hiatal hernia.  GU:  She has had no dysuria or frequency.  VASCULAR:  She has had no  claudication, rest pain, or nonhealing ulcers.  She has had no history of  DVT or phlebitis.  NEUROLOGIC:  She has had no dizziness, blackouts,  headaches, or seizures.  ORTHO/SKIN:  She has had no arthritis, joint pain,  muscle pain or rash.  PSYCHIATRIC:  She has  had no depression or  nervousness.  HEME:  She has had no bleeding problems or clotting disorders.   PHYSICAL EXAMINATION:  VITAL SIGNS:  Blood pressure is 132/82, heart rate  72.  LYMPHS:  She has no cervical lymphadenopathy.  LUNGS:  Clear bilaterally to auscultation.  I do not detect any carotid  bruits.  CARDIOVASCULAR:  She has a regular rate and rhythm.  ABDOMEN:  Soft and nontender.  VASCULAR:  She has palpable femoral and popliteal pulses bilaterally.  Both  feet are warm and well perfused.  EXTREMITIES:  She has mild bilateral lower extremity swelling.  NEUROLOGIC:  Nonfocal.   Carotid Duplex scan shows a greater than 80% right carotid stenosis with a  60 to 79% left carotid stenosis.  Both vertebral arteries are patent with  normally directed flow.  Of note, the bifurcation on the right was noted to  be slightly high.   Given the progression of the right carotid stenosis to greater than 80%, I  have recommended that right carotid endarterectomy in order to lower her  risk of future stroke.  We have discussed the indications for the procedure  and the potential complications including but not limited to, bleeding,  wound healing problems, stroke (perioperative risk 1 to 2%), MI, nerve  injury, or other unpredictable medical problems.  All of her  questions were  answered and she is agreeable to proceed.                                                Di Kindle. Edilia Bo, M.D.    CSD/MEDQ  D:  07/07/2003  T:  07/07/2003  Job:  [id number]

## 2010-09-01 NOTE — Procedures (Signed)
   NAME:  ANGLA, DELAHUNT                      ACCOUNT NO.:  [id number]   MEDICAL RECORD NO.:  [id number]                   PATIENT TYPE:  OUT   LOCATION:  RAD                                  FACILITY:  [facility]   PHYSICIAN:  Thomas C. Wall, M.D. [facility]            DATE OF BIRTH:  01-08-1933   DATE OF PROCEDURE:  DATE OF DISCHARGE:                                    STRESS TEST   INDICATION:  The patient is a 77 year old female, status post stent to RCA  with residual moderate CFX disease; normal left ventricular function by  catheterization February 2003, cerebro- and peripheral vascular disease,  hypertension, dyslipidemia, and history of tobacco.  The patient now  referred for adenosine stress testing to exclude CAD progression.   TEST:  Adenosine infused as per protocol; Cardiolite injected at 3-minute  mark.   The patient reported mild chest tightness during adenosine administration--  symptoms resolved spontaneously in the early recovery period.   Serial EKG tracings notable for approximate 1-mm horizontal ST depression in  leads II, III, aVF, V5, and V6 as compared to baseline.  Changes returned to  baseline at approximately 10-minute mark of recovery.  No atrioventricular  block or dysrhythmia noted.   Heart rate rose from 73 at baseline to 91 maximum; blood pressure 190/86  baseline to low 160/80 and final 180/86.   CONCLUSION:  Abnormal adenosine stress test secondary to inferolateral ST  changes from baseline; Cardiolite images pending.     Gene Serpe                                Thomas C. Daleen Squibb, M.D. [facility]    GS/MEDQ  D:  12/22/2001  T:  12/23/2001  Job:  [id number]

## 2010-09-01 NOTE — Discharge Summary (Signed)
[place]. [facility]  Patient:    [name], [name] Visit Number: [id number] MRN: [id number]          Service Type: CAT Location: 6500 [id number] Attending Physician:  Daisey Must Dictated by:   Brita Romp, P.A.-C. Admit Date:  06/06/2001 Discharge Date: 06/07/2001   CC:         Jonell Cluck, M.D.  Thomas C. Wall, M.D. [facility]                           Discharge Summary  DISCHARGE DIAGNOSES: 1. Coronary artery disease, status post cardiac catheterization. 2. Hypertension. 3. Hyperlipidemia. 4. Remote history of tobacco use. 5. Obesity.  HOSPITAL COURSE:  [name] is a 77 year old female who underwent a stress test in the [place] office on May 28, 2001.  Early in the test, she began experiencing markedly increase shortness of breath and the test was aborted.  Dr.Thomas C. Wall examined the EKGs and felt that cardiac catheterization was indicated.  On June 06, 2001, the patient was taken to the catheter laboratory by Dr. Loraine Leriche Pulsipher.  Catheterization results were as followed:  Left main coronary artery very short, left anterior descending coronary artery showed a proximal to mid-vessel less than 20%, circumflexshowed 60% lesion in the mid-vessel, in the OM1.  There was a 60% ostiallesion in the OM2.  The OM2 was small.  It had an 80% ostial lesion.  The OM3 was large and it had a less than 20% lesion.  The right coronary artery had ostial 99% lesion with distal TIMI II flow, followed by a mid-vessel 30% and a distal 50% lesion.  Left ventriculogram showed normal ejection fraction, no wall motion abnormalities.  Dr. Loraine Leriche Pulsipher then proceeded with angioplasty and stenting to the lesion in the ostium of the right coronary artery.  The 99% lesion was reduced down to 0%with TIMI III flow.  He planned to continue Integrilin for 18 hours and Plavix for 6 to 12 months.  The patient then underwent bilateral carotid doppler  ultrasound.  The left shows a 60-80% stenosis of the internal carotid artery at the highest end of range.  On the right, there was 60-80% ICA stenosis, again at the highest end of range by velocity.  There was an area reduction as well as a diameter reduction of greater than 80%.  Bilaterally, there was noted to be external carotid artery stenosis, left greater than right.  Vertebral artery flow was antegrade.  There was also a "to and fro" doppler signal on the left suggestive of possible subclavian stenosis.  The next day, the patients groin was stable.  He was felt to be ready for discharge.  DISCHARGE MEDICATIONS: 1. Enteric-coated aspirin 325 mg q.d. 2. Plavix 75 mg q.d. 3. Lipitor10 mg q.h.s. 4. Norvasc 5 mg q.d. 5. Ziac 5/625 mg q.d. 6. Nitroglycerin p.r.n. 7. Foltex 1 tablet q.d.  LABORATORY DATA:  Electrocardiogram shows a sinus rhythm of 75, PR interval of 156, QRS 84, QTC 404, axis of 9.  DISCHARGE INSTRUCTIONS:  The patient is to avoid driving, heavy lifting or tub baths for two days.  She is to follow a lowfat diet.  She is to watch the catheter site for any pain, bleeding, or swelling and to call the [place] office if any significant problems.  Sheis to follow up with Dr. Jonell Cluck as needed or scheduled.  She is to follow up in the [facility]  office with a PA visit on the day that Dr. Maisie Fus C. Wall is present and the office is to call. Dictated by: Brita Romp, P.A.-C. Attending Physician:  Daisey Must DD:  06/07/18 TD:  06/08/01 Job: [id number]

## 2010-09-01 NOTE — Discharge Summary (Signed)
NAMEMarland Perkins  DENNI, FRANCE                      ACCOUNT NO.:  [id number]   MEDICAL RECORD NO.:  [id number]                   PATIENT TYPE:  INP   LOCATION:  3311                                 FACILITY:  [facility]   PHYSICIAN:  Di Kindle. Edilia Bo, M.D.        DATE OF BIRTH:  08-17-1932   DATE OF ADMISSION:  07/22/2003  DATE OF DISCHARGE:  07/23/2003                                 DISCHARGE SUMMARY   PRIMARY ADMITTING DIAGNOSIS:  Asymptomatic right carotid stenosis.   HISTORY OF PRESENT ILLNESS:  The patient is a 77 year old female who has  been followed by Dr. Edilia Bo for bilateral carotid stenoses.  On her most  recent visit to the office on July 07, 2003, she had a repeat Doppler study  which showed worsening of the stenosis on the right side, now to greater  than 80%.  She was having no neurological symptoms, but, because of the  significant increase in her stenosis, it was Dr. Adele Dan opinion that she  should proceed with carotid endarterectomy at this time in order to decrease  her risk of stroke.  After her explanation of the risks, benefits, and  alternatives to the procedure, the patient agreed to proceed.   HOSPITAL COURSE:  She was brought in for outpatient admission to [facility] on July 22, 2003.  She was taken to the operating room where she  underwent a right carotid endarterectomy with Dacron patch angioplasty  performed by Dr. Edilia Bo.  She tolerated the procedure well, and was  transferred from the recovery room to the 3300 unit in stable condition.  Postoperatively, she has done well.  She has remained afebrile, and all  vital signs have been stable.  She has remained completely neurologically  intact.  Her surgical incision sites are healing well.  She is tolerating a  regular diet, and is having normal bowel and bladder function.  She is  ambulating in the halls without difficulty.  Her laboratories on  postoperative day #1 show a hemoglobin of 13.6,  hematocrit 40.2, white blood  cell count 12.6, platelets 222.  Her chemistry panel was within normal  limits.  She has been seen and evaluated by Dr. Edilia Bo, and it is felt that  she may be discharged home today.   DISCHARGE MEDICATIONS:  1. Enteric-coated aspirin 325 mg daily.  2. Bisoprolol 10 mg daily.  3. Lipitor 20 mg daily.  4. Norvasc 10 mg daily.  5. Diovan 80 mg daily.  6. Foltx one daily.  7. Tylox 1-2 q.4h. p.r.n. pain.   DISCHARGE INSTRUCTIONS:  1. She is to refrain from driving, heavy lifting, or strenuous activity.  2. She may continue walking daily and using her incentive spirometer.  3. She may shower daily and clean her incisions with soap and water.  4. She may continue her same preoperative diet.   DISCHARGE FOLLOW UP:  She will see Dr. Edilia Bo back in the office in 3  weeks.  She is asked to call our office in the interim if she experiences  any neurologic changes, trouble swallowing, fever greater than 101, redness,  swelling, or drainage of the incision sites, or significant swelling in the  neck.      Coral Ceo, P.A.                        Di Kindle. Edilia Bo, M.D.    GC/MEDQ  D:  07/23/2003  T:  07/23/2003  Job:  [id number]   cc:   Patrica Duel, M.D.  [address]  [place]  [place] [address]  Fax: [phone]

## 2010-09-01 NOTE — Op Note (Signed)
NAMEMarland Kitchen  JOCELIN, SCHUELKE                      ACCOUNT NO.:  [id number]   MEDICAL RECORD NO.:  [id number]                   PATIENT TYPE:  INP   LOCATION:  2899                                 FACILITY:  [facility]   PHYSICIAN:  Di Kindle. Edilia Bo, M.D.        DATE OF BIRTH:  Dec 19, 1932   DATE OF PROCEDURE:  07/22/2003  DATE OF DISCHARGE:                                 OPERATIVE REPORT   PREOPERATIVE DIAGNOSIS:  Asymptomatic 80% right carotid stenosis.   POSTOPERATIVE DIAGNOSIS:  Asymptomatic 80% right carotid stenosis.   OPERATION PERFORMED:  Right carotid endarterectomy with Dacron patch  angioplasty.   SURGEON:  Di Kindle. Edilia Bo, M.D.   ASSISTANT:  Jerold Coombe, P.A.   ANESTHESIA:  General.   INDICATIONS FOR PROCEDURE:  The patient is a 77 year old woman I have been  following with bilateral carotid disease.  The stenosis on the right  progressed to greater than 80%.  She was asymptomatic.  The right carotid  endarterectomy was recommended in order to lower her risk of future stroke.  The procedure and potential complications including but not limited to  bleeding, stroke, perioperative risks 1 to 2% (MI, nerve injury, or other  unpredictable medical problems were discussed with the patient.  All of her  questions were answered and she was agreeable to proceed.   DESCRIPTION OF PROCEDURE:  The patient was taken to the operating room after  an arterial line was placed by anesthesia.  The right neck was prepped and  draped in the usual sterile fashion after the patient received a general  anesthetic.  An incision was made along the anterior border of the  sternocleidomastoid muscle and the dissection carried down to the common  carotid artery which was dissected free and controlled with a Rumel  tourniquet.  The facial vein was divided between 2-0 silk ties.  The  internal carotid artery and external carotid artery were dissected free.  Of  note there was a large  lymph node superiorly and I preserved this.  The  plaque was fairly well localized right at the bifurcation.  The patient was  then heparinized.  Clamps were then placed on the internal, then the common,  then the external carotid artery.  A longitudinal arteriotomy was made in  the common carotid artery and this was extended through the plaque into the  internal carotid artery.  A 10 shunt was placed into the internal carotid  artery, back-led and then placed into the common carotid artery and secured  with Rumel tourniquet.  Flow was re-established through the shunt.  An  endarterectomy plane was established proximally and the plaque was sharply  divided.  Eversion endarterectomy was performed to the external carotid  artery.  Distally, there was a nice taper in the plaque.  One tacking suture  was placed.  The artery was irrigated with copious amounts of heparin and  dextran and all loose debris removed.  Dacron patch was then  sewn using  continuous 6-0 Prolene suture.  Prior to completing the patch closure the  shunt was removed.  The vessels back-bled and flushed appropriately and the  anastomosis completed.  Flow was re-established first to the external  carotid artery and then to the internal carotid artery.  At the completion  there was a good pulse distal to the patch and a good Doppler signal.  Hemostasis was obtained in the wound.  The wound was closed with a deep  layer of 3-0 Vicryl.  The platysma was closed with a  running 3-0 Vicryl.  The skin was closed with a 4-0 subcuticular stitch.  A  sterile dressing was applied, the patient tolerated the procedure well and  was transferred to the recovery room in satisfactory condition.  All needle  and sponge counts were correct.                                               Di Kindle. Edilia Bo, M.D.    CSD/MEDQ  D:  07/22/2003  T:  07/22/2003  Job:  [id number]

## 2010-09-01 NOTE — Cardiovascular Report (Signed)
[place]. [facility]  Patient:    [name], [name] Visit Number: [id number] MRN: [id number]          Service Type: OUT Location: RAD Attending Physician:  Mirian Mo Dictated by:   Daisey Must, M.D. [facility] Proc. Date: 06/06/01 Admit Date:  06/05/2001 Discharge Date: 06/05/2001   CC:         Thomas C. Daleen Squibb, M.D. [facility]  Jonell Cluck, M.D.  Cardiac Catheterization Laboratory   Cardiac Catheterization  PROCEDURES PERFORMED: 1. Left heart catheterization with coronary angiography, left    ventriculography, and abdominal aortography. 2. Percutaneous transluminal coronary angioplasty with stent placement    in the ostium of the right coronary artery.  INDICATIONS: The patient is a 77 year old woman, who has had progressive exertional dyspnea. She had an exercise treadmill test, which was notable for early-positive ECG changes and developed significant dyspnea.  She was thus referred for cardiac catheterization.  CATHETERIZATION PROCEDURE: A 6 French sheath was placed in the right femoral artery.  Standard Judkins 6 French catheters were utilized.  Contrast was Omnipaque. There were no complications.  RESULTS:  HEMODYNAMICS: Left ventricular pressure 142/24, aortic pressure 142/60.  There was no aortic valve gradient.  LEFT VENTRICULOGRAM: Wall motion is normal. Ejection fraction estimated at greater than 60%. There was significant ventricular ectopy, so precise ejection fraction could be quantitated. There is also some mitral regurgitation, which is likely mostly secondary to ventricular ectopy.  ABDOMINAL AORTOGRAM: Abdominal aortogram revealed a 50% stenosis in the right renal artery, 80% in the left renal artery. There is mild ectasia in the distal abdominal aorta and a 30% stenosis in the left iliac artery.  CORONARY ARTERIOGRAPHY: (Right dominant).  Left main is very short and appears to be free of significant  angiographic disease.  Left anterior descending artery has minor luminal irregularities in the proximal to mid vessel. The distal LAD gives rise to a single small diagonal branch.  Left circumflex has a 60% stenosis in the mid vessel. It gives rise to a normal sized first marginal branch, which has a 60% stenosis at its origin. There is a relatively small second marginal branch, which has an 80% stenosis at its origin. The third marginal branch is large in size and has minor luminal irregularities.  Right coronary artery is a dominant vessel, which has a high anterior origin. There is a 99% stenosis in the ostium of the right coronary artery. The mid vessel has 30% stenosis and the mid to distal vessel has a 50% stenosis. There is TIMI-2 flow into the distal vessel. The right coronary artery gives rise to a large acute marginal branch supplying a portion of the inferior septum, a small posterior descending artery and two small posterolateral branches.  IMPRESSIONS: 1. Normal left ventricular systolic function. 2. Bilateral renal artery stenosis, greater on the left than the right. 3. Two-vessel coronary artery disease as described. The culprit lesion    appears to be 99% stenosed in the ostium of the right coronary artery.    There is moderate disease in the left circumflex, which appears to be    of borderline severity as described.  PLAN: Percutaneous intervention of the right coronary artery. See below.  PERCUTANEOUS TRANSLUMINAL CORONARY ANGIOPLASTY PROCEDURE: Following completion of the diagnostic catheterization, we proceeded with percutaneous coronary intervention. The 6 French sheath in the right femoral artery was exchanged over a wire for a 7 [place] sheath. A 6 French sheath was placed in the right femoral vein.  Heparin and Integrilin were administered per protocol. We used a 6 French LCB guiding catheter with side holes and a BMW wire. The lesion in the ostium of the  right coronary artery was predilated with a 2.5 x 15 mm Quantum balloon inflated to 12 atmospheres. This resulted in improvement in the vessel lumen. We then deployed a 2.5 x 12 mm Express II stent at a deployment pressure of 16 atmospheres. We then pulled the stent delivery balloon back slightly and inflated it again to 18 atmospheres. Following this, we positioned a 3.0 x 8 mm Quantum balloon in the mid to distal portion of the stent proximal to the terminal portion of the stent and inflated it to 12 atmospheres. We then pulled this balloon back slightly to the proximal to midportion of the stent and inflated it to 18 atmospheres. Final angiographic images revealed patency of the right coronary artery with 0% residual stenosis and TIMI-3 flow.  COMPLICATIONS: None.  RESULTS: Successful percutaneous transluminal coronary angioplasty with stent placement in the ostium of the right coronary artery reducing a 99% stenosis with TIMI-2 flow to 0% residual with TIMI-3 flow.  PLAN: Integrilin will be continued overnight. It is recommended that Plavix be continued for 6 to 12 months. The patient will follow up with Dr. Daleen Squibb in the office regarding her renal artery stenosis to determine whether renal artery stenting versus continued medical therapy would be indicated. Dictated by:   Daisey Must, M.D. [facility] Attending Physician:  Mirian Mo DD:  06/06/01 TD:  06/07/01 Job: [id number]

## 2010-09-01 NOTE — Discharge Summary (Signed)
[place]. [facility]  Patient:    MYLIA, PONDEXTER Visit Number: [id number] MRN: [id number]          Service Type: CAT Location: 6500 [id number] Attending Physician:  Daisey Must Dictated by:   Brita Romp, P.A. Admit Date:  06/06/2001 Discharge Date: 06/07/2001                             Discharge Summary  Addendum to discharge diagnoses:  Renal artery stenosis.  Addendum to catheterization results:  Before the mention of angioplasty, please add:  Right renal artery:  50% stenosis.  Left renal artery:  80% stenosis.  Left iliac 30%.  Addendum at the very end:  Both the patients renal artery stenosis, as well as her carotid stenosis will be followed up by Maisie Fus C. Wall, M.D., in the office.  Daisey Must, M.D., has recommended delay of any carotid endarterectomy until at least a month post stent placement. Dictated by:   Brita Romp, P.A. Attending Physician:  Daisey Must DD:  06/07/01 TD:  06/08/01 Job: [id number]

## 2010-09-01 NOTE — Letter (Signed)
May 22, 2006    Patrica Duel, M.D.  [address]  [place], [place] [address]   RE:  [name], [name]  MRN:  [id number]  /  DOB:  12/07/1932   Dear Loraine Leriche:   [name] returns to the office for continuing management of  cardiovascular risk factors with a known history of coronary disease.  Since her right coronary was stented in 2003, she has done well from a  cardiac standpoint.  She denies chest pain or dyspnea.  She has also  undergone bilateral renal artery stenting and a right carotid  endarterectomy.  She has not experienced any serious medical problems  nor required urgent medical care during the year since she last saw Dr.  Dorethea Clan.   Current medications include:  1. Diovan 80 mg daily.  2. Bisoprolol 20 mg daily.  3. Caduet 10/20 mg daily.  4. Aspirin 81 mg daily.   EXAMINATION:  GENERAL:  Pleasant, rotund woman in no acute distress.  VITAL SIGNS:  The weight is 203, 1 pound less than last year.  Blood  pressure 140/70, heart rate 70 and regular, respirations 16.  NECK:  No jugular venous distention; well-healed right carotid  endarterectomy scar; modest left carotid bruit.  LUNGS:  Clear.  CARDIAC:  Normal first and second heart sounds.  Fourth heart sound  present.  ABDOMEN:  Soft and nontender.  No masses, no organomegaly.  EXTREMITIES:  No edema, distal pulses intact.   [name] has not received influenza vaccine nor pneumococcal vaccine  either this year or ever.  She continues to refuse these measures.  A  recent lipid profile was quite good with total cholesterol 162,  triglycerides 113, HDL 45, and LDL of 94.  Her dose of lovastatin will  be increased to 40 mg daily for slightly better lipid control.  She is  advised to take fish oil one capsule b.i.d.  I will see this nice woman  again in 1 year.    Sincerely,      Gerrit Friends. Dietrich Pates, MD, [facility]  Electronically Signed    RMR/MedQ  DD: 05/22/2006  DT: 05/22/2006  Job #:  [id number]

## 2010-09-01 NOTE — Discharge Summary (Signed)
NAME:  [name], [name]                      ACCOUNT NO.:  [id number]   MEDICAL RECORD NO.:  [id number]                   PATIENT TYPE:  OIB   LOCATION:  4703                                 FACILITY:  [facility]   PHYSICIAN:  Salvadore Farber, M.D. [facility]         DATE OF BIRTH:  1933/02/20   DATE OF ADMISSION:  05/20/2002  DATE OF DISCHARGE:                           DISCHARGE SUMMARY - REFERRING   REFERRING PHYSICIAN:  Dr. Jesse Sans. Wall.   HOSPITAL COURSE:  The patient is a 77 year old female with a history of  coronary, cerebrovascular and renovascular disease.  In February of 2003,  she underwent coronary angiography which revealed moderate stenosis of the  right renal artery and severe stenosis of the left renal artery.  Because of  difficult-to-control hypertension and known renal artery disease, she was  admitted to [facility] on May 20, 2002 for angiography.  Dr. Salvadore Farber performed the procedure and she was found to have a 90% stenosis  of the right renal artery.  Due to the severe stenosis, the patient then  underwent successful stenting of the right renal artery.  She was also  demonstrated to have severe left renal artery stenosis and then required a  Herculink stent to this area.  She tolerated the procedure well and remained  hospitalized over the next 24 hours.  As part of her followup, she did have  an ultrasound of the right groin secondary to femoral bruit and there was no  evidence of A-V fistula or pseudoaneurysm.  Her blood pressures also had  dropped dramatically, ranging from a systolic of 105 to 160.  For this  reason, we did stop her clonidine.   Lab work during her hospital stay included a hemoglobin of 15.3, hematocrit  of 45.0, BUN of 11, creatinine of 0.8, platelets 244,000, white count 11.2.   At this point, the patient is felt to be safe to go home on the following  medications:   DISCHARGE MEDICATIONS:  1. Plavix 75 mg one p.o. daily  for 30 days.  2. Enteric-coated aspirin 325 mg a day.  3. Norvasc 10 mg a day.  4. Bisoprolol 10 mg a day.  5. Lipitor 20 mg a day.  6. She may use Tylenol 325 mg one to two tablets q.4-6h. p.r.n. pain.   SPECIAL DISCHARGE INSTRUCTIONS:  She is to stop her clonidine.   ACTIVITY:  No strenuous activities for two days, then gradually increase  activity.   DIET:  Remain on a low-fat diet.   WOUND CARE:  Clean over the incision with soap and water.    FOLLOWUP:  Call for any questions or concerns and we will have her followup  in the office on March 29, 2003 at 12:15 for blood pressure reevaluation.  At that point, we will need to obtain a BMET as well as arrange renal  Dopplers to establish her baseline.     Guy Franco, P.A.  [facility]                      Salvadore Farber, M.D. [facility]    LB/MEDQ  D:  05/21/2002  T:  05/21/2002  Job:  [id number]   cc:   Patrica Duel, M.D.  [address]  [place]  [place] [address]  Fax: [phone]   Jesse Sans. Wall, M.D. [facility]  [address]  [place]  [place] [address]  Fax: 1

## 2010-09-01 NOTE — Cardiovascular Report (Signed)
NAMEMarland Perkins  MUREL, WIGLE                      ACCOUNT NO.:  [id number]   MEDICAL RECORD NO.:  [id number]                   PATIENT TYPE:  OIB   LOCATION:  2899                                 FACILITY:  [facility]   PHYSICIAN:  Salvadore Farber, M.D. [facility]         DATE OF BIRTH:  1933/02/23   DATE OF PROCEDURE:  05/20/2002  DATE OF DISCHARGE:                              CARDIAC CATHETERIZATION   PROCEDURE PERFORMED:  1. Selective bilateral renal angiography.  2. Bilateral renal artery stenting.   CARDIOLOGIST:  Salvadore Farber, M.D.   INDICATIONS FOR PROCEDURE:  The patient is a 77 year old lady with coronary,  cerebrovascular and renovascular disease.  Dr. Daleen Squibb referred her to me for  hypertension that was less than optimally controlled despite three  antihypertensive medications at substantial doses.  At coronary angiography  performed in February 2003 she was found to have at least a moderate  stenosis of the right renal artery and a severe stenosis of the left renal  artery.  Due to poorly controlled hypertension and known renal artery  stenosis plan was for diagnostic angiography of the right to assess its  severity and planned stenting of the left renal artery.   PROCEDURAL TECHNIQUE:  Informed consent was obtained.  Under 1% lidocaine  local anesthesia a 6 French sheath was placed in the right femoral artery  using the modified Seldinger technique.  A 5 French diagnostic LIMA guide  was selectively engaged in the right renal artery with placement guided by  the previously performed aortogram.  Angiography demonstrated approximately  a 90% stenosis of the right renal artery.  Based on the severe stenosis and  a gradient demonstrated plan was made to intervene upon the right renal  artery.   A 6 French LIMA guide was advanced over a wire and engaged the ostium of the  right renal artery.  A Spartacor wire was advanced to the distal renal  artery.  The lesion was primarily  stented using a 5.0 x 12 mm Herculink  stent at 14 atmospheres.  The proximal portion of the stent was then post  dilated at 14 atmospheres.  The stent was then post dilated using a 6.0 mm  Viatrak balloon.  This was dilated to 12 atmospheres throughout the entirety  of the stent and to 14 mm in the ostial portion of the stent.  Final  angiograms demonstrated no residual stenosis and normal flow with a normal  nephrogram.   Attention was then turned to the known severe left renal artery stenosis.  The LIMA  guide was selectively engaged in the left renal artery.  The  Spartacor wire was advanced to the distal left renal artery.  The lesion was  directly stented using a 6.0 x 15 mm Herculink stent deployed at  12  atmospheres.  The proximal portion of the stent was post dilated at 14  atmospheres.  Final angiograms demonstrated no residual stenosis, normal  flow  and a normal nephrogram.   COMPLICATIONS:  None.   IMPRESSION/RECOMMENDATIONS:  Successful stenting of severe stenoses at the  ostia of both renal arteries resulting in excellent angiographic result.   PLAN:  1. Continue aspirin indefinitely.  2. She will be continued on Plavix for 30 days.  3.     She will need careful observation for lessening of her blood pressure after      relief of the severe stenoses.  4. She will be seen in my office in one week's time for reassessment of her     blood pressure and renal function.  5. She will be admitted to the hospital overnight for observation and     administration of fluids.                                                 Salvadore Farber, M.D. [facility]    WED/MEDQ  D:  05/20/2002  T:  05/21/2002  Job:  [id number]   cc:   Patrica Duel, M.D.  [address]  [place]  [place] [address]  Fax: [phone]   Jesse Sans. Wall, M.D. [facility]  [address]  [place]  [place] [address]  Fax: 1

## 2010-09-13 ENCOUNTER — Ambulatory Visit (INDEPENDENT_AMBULATORY_CARE_PROVIDER_SITE_OTHER): Payer: Medicare Other | Admitting: *Deleted

## 2010-09-13 DIAGNOSIS — I4891 Unspecified atrial fibrillation: Secondary | ICD-10-CM

## 2010-09-13 DIAGNOSIS — Z7901 Long term (current) use of anticoagulants: Secondary | ICD-10-CM

## 2010-09-13 LAB — POCT INR: INR: 2.6

## 2010-09-28 ENCOUNTER — Other Ambulatory Visit: Payer: Self-pay | Admitting: Cardiology

## 2010-09-29 LAB — CBC WITH DIFFERENTIAL/PLATELET
HCT: 47.9 % — ABNORMAL HIGH (ref 36.0–46.0)
Hemoglobin: 14.8 g/dL (ref 12.0–15.0)
Lymphocytes Relative: 22 % (ref 12–46)
Lymphs Abs: 1.8 10*3/uL (ref 0.7–4.0)
Monocytes Absolute: 0.8 10*3/uL (ref 0.1–1.0)
Monocytes Relative: 9 % (ref 3–12)
Neutro Abs: 5.5 10*3/uL (ref 1.7–7.7)
RBC: 5.43 MIL/uL — ABNORMAL HIGH (ref 3.87–5.11)
WBC: 8.1 10*3/uL (ref 4.0–10.5)

## 2010-09-29 LAB — COMPREHENSIVE METABOLIC PANEL
AST: 27 U/L (ref 0–37)
BUN: 15 mg/dL (ref 6–23)
CO2: 30 mEq/L (ref 19–32)
Calcium: 9.5 mg/dL (ref 8.4–10.5)
Chloride: 104 mEq/L (ref 96–112)
Creat: 0.92 mg/dL (ref 0.50–1.10)
Glucose, Bld: 111 mg/dL — ABNORMAL HIGH (ref 70–99)

## 2010-09-29 LAB — LIPID PANEL
Cholesterol: 103 mg/dL (ref 0–200)
Triglycerides: 92 mg/dL (ref <150)
VLDL: 18 mg/dL (ref 0–40)

## 2010-10-09 ENCOUNTER — Other Ambulatory Visit (INDEPENDENT_AMBULATORY_CARE_PROVIDER_SITE_OTHER): Payer: Medicare Other

## 2010-10-09 DIAGNOSIS — I6529 Occlusion and stenosis of unspecified carotid artery: Secondary | ICD-10-CM

## 2010-10-09 DIAGNOSIS — Z48812 Encounter for surgical aftercare following surgery on the circulatory system: Secondary | ICD-10-CM

## 2010-10-11 ENCOUNTER — Ambulatory Visit (INDEPENDENT_AMBULATORY_CARE_PROVIDER_SITE_OTHER): Payer: Medicare Other | Admitting: *Deleted

## 2010-10-11 DIAGNOSIS — Z7901 Long term (current) use of anticoagulants: Secondary | ICD-10-CM

## 2010-10-11 DIAGNOSIS — I4891 Unspecified atrial fibrillation: Secondary | ICD-10-CM

## 2010-10-11 LAB — POCT INR: INR: 2.5

## 2010-10-20 NOTE — Procedures (Unsigned)
CAROTID DUPLEX EXAM  INDICATION:  Followup carotid artery disease.  HISTORY: Diabetes:  No. Cardiac:  Stent and angioplasty. Hypertension:  Yes. Smoking:  Previous. Previous Surgery:  Left carotid endarterectomy 09/29/2008, right carotid endarterectomy 2005. CV History:  The patient is currently asymptomatic. Amaurosis Fugax No, Paresthesias No, Hemiparesis No                                      RIGHT             LEFT Brachial systolic pressure:         158               154 Brachial Doppler waveforms:         WNL               WNL Vertebral direction of flow:        Antegrade         Atypical antegrade DUPLEX VELOCITIES (cm/sec) CCA peak systolic                   58                66 ECA peak systolic                   93                115 ICA peak systolic                   88                132 ICA end diastolic                   23                21 PLAQUE MORPHOLOGY: PLAQUE AMOUNT: PLAQUE LOCATION:  IMPRESSION:  Patent and durable right carotid endarterectomy with no hemodynamically significant stenosis.  Left side has 40%-59% stenosis within internal carotid artery.  This could be secondary to a change in vessel diameter due to post surgical changes.  Decreased end diastolic velocities within bilateral common carotid arteries.  Absent end diastolic velocity within the right external carotid artery.  Prominent right vertebral.  Absent end diastolic velocity within the left vertebral suggestive of stenosis.  Study stable compared to previous.  ___________________________________________ Di Kindle. Edilia Bo, M.D.  OD/MEDQ  D:  10/09/2010  T:  10/09/2010  Job:  [id number]

## 2010-10-24 ENCOUNTER — Other Ambulatory Visit: Payer: Self-pay | Admitting: Cardiology

## 2010-11-08 ENCOUNTER — Ambulatory Visit (INDEPENDENT_AMBULATORY_CARE_PROVIDER_SITE_OTHER): Payer: Medicare Other | Admitting: *Deleted

## 2010-11-08 DIAGNOSIS — I4891 Unspecified atrial fibrillation: Secondary | ICD-10-CM

## 2010-11-08 DIAGNOSIS — Z7901 Long term (current) use of anticoagulants: Secondary | ICD-10-CM

## 2010-11-08 LAB — POCT INR: INR: 3.4

## 2010-12-06 ENCOUNTER — Ambulatory Visit (INDEPENDENT_AMBULATORY_CARE_PROVIDER_SITE_OTHER): Payer: Medicare Other | Admitting: *Deleted

## 2010-12-06 DIAGNOSIS — Z7901 Long term (current) use of anticoagulants: Secondary | ICD-10-CM

## 2010-12-06 DIAGNOSIS — I4891 Unspecified atrial fibrillation: Secondary | ICD-10-CM

## 2010-12-06 LAB — POCT INR: INR: 3.3

## 2011-01-03 ENCOUNTER — Encounter: Payer: Medicare Other | Admitting: *Deleted

## 2011-01-04 ENCOUNTER — Ambulatory Visit (INDEPENDENT_AMBULATORY_CARE_PROVIDER_SITE_OTHER): Payer: Medicare Other | Admitting: *Deleted

## 2011-01-04 DIAGNOSIS — Z7901 Long term (current) use of anticoagulants: Secondary | ICD-10-CM

## 2011-01-04 DIAGNOSIS — I4891 Unspecified atrial fibrillation: Secondary | ICD-10-CM

## 2011-01-21 ENCOUNTER — Other Ambulatory Visit: Payer: Self-pay | Admitting: Cardiology

## 2011-01-31 ENCOUNTER — Ambulatory Visit (INDEPENDENT_AMBULATORY_CARE_PROVIDER_SITE_OTHER): Payer: Medicare Other | Admitting: *Deleted

## 2011-01-31 DIAGNOSIS — I4891 Unspecified atrial fibrillation: Secondary | ICD-10-CM

## 2011-01-31 DIAGNOSIS — Z7901 Long term (current) use of anticoagulants: Secondary | ICD-10-CM

## 2011-01-31 LAB — POCT INR: INR: 2.7

## 2011-02-13 ENCOUNTER — Other Ambulatory Visit (HOSPITAL_COMMUNITY): Payer: Self-pay | Admitting: Physician Assistant

## 2011-02-13 ENCOUNTER — Ambulatory Visit (HOSPITAL_COMMUNITY)
Admission: RE | Admit: 2011-02-13 | Discharge: 2011-02-13 | Disposition: A | Discharge status: Home / Self Care | Payer: Medicare Other | Source: Ambulatory Visit | Attending: Physician Assistant | Admitting: Physician Assistant

## 2011-02-13 DIAGNOSIS — W19XXXA Unspecified fall, initial encounter: Secondary | ICD-10-CM | POA: Insufficient documentation

## 2011-02-13 DIAGNOSIS — M25539 Pain in unspecified wrist: Secondary | ICD-10-CM

## 2011-02-13 DIAGNOSIS — S6990XA Unspecified injury of unspecified wrist, hand and finger(s), initial encounter: Secondary | ICD-10-CM | POA: Insufficient documentation

## 2011-02-13 DIAGNOSIS — S59909A Unspecified injury of unspecified elbow, initial encounter: Secondary | ICD-10-CM | POA: Insufficient documentation

## 2011-02-18 ENCOUNTER — Other Ambulatory Visit: Payer: Self-pay | Admitting: Cardiology

## 2011-02-28 ENCOUNTER — Encounter: Payer: Medicare Other | Admitting: *Deleted

## 2011-02-28 ENCOUNTER — Ambulatory Visit (INDEPENDENT_AMBULATORY_CARE_PROVIDER_SITE_OTHER): Payer: Medicare Other | Admitting: *Deleted

## 2011-02-28 DIAGNOSIS — Z7901 Long term (current) use of anticoagulants: Secondary | ICD-10-CM

## 2011-02-28 DIAGNOSIS — I4891 Unspecified atrial fibrillation: Secondary | ICD-10-CM

## 2011-02-28 LAB — POCT INR: INR: 4.3

## 2011-03-14 ENCOUNTER — Ambulatory Visit (INDEPENDENT_AMBULATORY_CARE_PROVIDER_SITE_OTHER): Payer: Medicare Other | Admitting: *Deleted

## 2011-03-14 DIAGNOSIS — I4891 Unspecified atrial fibrillation: Secondary | ICD-10-CM

## 2011-03-14 DIAGNOSIS — Z7901 Long term (current) use of anticoagulants: Secondary | ICD-10-CM

## 2011-03-29 ENCOUNTER — Ambulatory Visit (INDEPENDENT_AMBULATORY_CARE_PROVIDER_SITE_OTHER): Payer: Medicare Other | Admitting: *Deleted

## 2011-03-29 DIAGNOSIS — Z7901 Long term (current) use of anticoagulants: Secondary | ICD-10-CM

## 2011-03-29 DIAGNOSIS — I4891 Unspecified atrial fibrillation: Secondary | ICD-10-CM

## 2011-03-29 LAB — POCT INR: INR: 2

## 2011-04-25 ENCOUNTER — Ambulatory Visit (INDEPENDENT_AMBULATORY_CARE_PROVIDER_SITE_OTHER): Payer: Medicare Other | Admitting: *Deleted

## 2011-04-25 DIAGNOSIS — I4891 Unspecified atrial fibrillation: Secondary | ICD-10-CM

## 2011-04-25 DIAGNOSIS — Z7901 Long term (current) use of anticoagulants: Secondary | ICD-10-CM

## 2011-04-25 LAB — POCT INR: INR: 2.8

## 2011-04-26 DIAGNOSIS — J069 Acute upper respiratory infection, unspecified: Secondary | ICD-10-CM | POA: Diagnosis not present

## 2011-04-26 DIAGNOSIS — J111 Influenza due to unidentified influenza virus with other respiratory manifestations: Secondary | ICD-10-CM | POA: Diagnosis not present

## 2011-04-26 DIAGNOSIS — Z6834 Body mass index (BMI) 34.0-34.9, adult: Secondary | ICD-10-CM | POA: Diagnosis not present

## 2011-05-23 ENCOUNTER — Ambulatory Visit (INDEPENDENT_AMBULATORY_CARE_PROVIDER_SITE_OTHER): Payer: Medicare Other | Admitting: *Deleted

## 2011-05-23 DIAGNOSIS — Z7901 Long term (current) use of anticoagulants: Secondary | ICD-10-CM

## 2011-05-23 DIAGNOSIS — I4891 Unspecified atrial fibrillation: Secondary | ICD-10-CM | POA: Diagnosis not present

## 2011-05-28 DIAGNOSIS — I4891 Unspecified atrial fibrillation: Secondary | ICD-10-CM | POA: Diagnosis not present

## 2011-05-28 DIAGNOSIS — E785 Hyperlipidemia, unspecified: Secondary | ICD-10-CM | POA: Diagnosis not present

## 2011-05-28 DIAGNOSIS — I10 Essential (primary) hypertension: Secondary | ICD-10-CM | POA: Diagnosis not present

## 2011-05-30 DIAGNOSIS — E785 Hyperlipidemia, unspecified: Secondary | ICD-10-CM | POA: Diagnosis not present

## 2011-05-30 DIAGNOSIS — I10 Essential (primary) hypertension: Secondary | ICD-10-CM | POA: Diagnosis not present

## 2011-06-10 ENCOUNTER — Other Ambulatory Visit: Payer: Self-pay | Admitting: Cardiology

## 2011-07-04 ENCOUNTER — Ambulatory Visit (INDEPENDENT_AMBULATORY_CARE_PROVIDER_SITE_OTHER): Payer: Medicare Other | Admitting: *Deleted

## 2011-07-04 DIAGNOSIS — I4891 Unspecified atrial fibrillation: Secondary | ICD-10-CM

## 2011-07-04 DIAGNOSIS — Z7901 Long term (current) use of anticoagulants: Secondary | ICD-10-CM

## 2011-07-04 LAB — POCT INR: INR: 2

## 2011-08-13 ENCOUNTER — Other Ambulatory Visit: Payer: Self-pay | Admitting: Cardiology

## 2011-08-15 ENCOUNTER — Ambulatory Visit (INDEPENDENT_AMBULATORY_CARE_PROVIDER_SITE_OTHER): Payer: Medicare Other | Admitting: *Deleted

## 2011-08-15 DIAGNOSIS — Z7901 Long term (current) use of anticoagulants: Secondary | ICD-10-CM | POA: Diagnosis not present

## 2011-08-15 DIAGNOSIS — I4891 Unspecified atrial fibrillation: Secondary | ICD-10-CM

## 2011-08-15 LAB — POCT INR: INR: 2.2

## 2011-08-30 ENCOUNTER — Ambulatory Visit (INDEPENDENT_AMBULATORY_CARE_PROVIDER_SITE_OTHER): Payer: Medicare Other | Admitting: Cardiology

## 2011-08-30 ENCOUNTER — Encounter: Payer: Self-pay | Admitting: Cardiology

## 2011-08-30 VITALS — BP 135/63 | HR 69 | Resp 16 | Ht 62.0 in | Wt 192.0 lb

## 2011-08-30 DIAGNOSIS — E785 Hyperlipidemia, unspecified: Secondary | ICD-10-CM

## 2011-08-30 DIAGNOSIS — I679 Cerebrovascular disease, unspecified: Secondary | ICD-10-CM

## 2011-08-30 DIAGNOSIS — Z7901 Long term (current) use of anticoagulants: Secondary | ICD-10-CM

## 2011-08-30 DIAGNOSIS — I872 Venous insufficiency (chronic) (peripheral): Secondary | ICD-10-CM

## 2011-08-30 DIAGNOSIS — I2510 Atherosclerotic heart disease of native coronary artery without angina pectoris: Secondary | ICD-10-CM | POA: Diagnosis not present

## 2011-08-30 DIAGNOSIS — I4891 Unspecified atrial fibrillation: Secondary | ICD-10-CM

## 2011-08-30 DIAGNOSIS — R0602 Shortness of breath: Secondary | ICD-10-CM | POA: Diagnosis not present

## 2011-08-30 DIAGNOSIS — I10 Essential (primary) hypertension: Secondary | ICD-10-CM

## 2011-08-30 LAB — COMPREHENSIVE METABOLIC PANEL
AST: 26 U/L (ref 0–37)
BUN: 17 mg/dL (ref 6–23)
Calcium: 9.8 mg/dL (ref 8.4–10.5)
Chloride: 101 mEq/L (ref 96–112)
Creat: 0.85 mg/dL (ref 0.50–1.10)
Total Bilirubin: 1.3 mg/dL — ABNORMAL HIGH (ref 0.3–1.2)

## 2011-08-30 LAB — CBC
HCT: 41.5 % (ref 36.0–46.0)
Hemoglobin: 14 g/dL (ref 12.0–15.0)
MCHC: 33.7 g/dL (ref 30.0–36.0)
MCV: 82.7 fL (ref 78.0–100.0)
WBC: 7.9 10*3/uL (ref 4.0–10.5)

## 2011-08-30 MED ORDER — VERAPAMIL HCL ER 240 MG PO TBCR: 240.0000 mg | EXTENDED_RELEASE_TABLET | Freq: Every day | ORAL | Status: DC

## 2011-08-30 NOTE — Progress Notes (Signed)
Patient ID: [name], female   DOB: 1932/10/30, 78 y.o.   MRN: [id number]  HPI: Scheduled return visit for this lovely woman with coronary artery disease, cerebrovascular disease, and chronic atrial fibrillation requiring long-term anticoagulation.  Since her last visit, she has done generally well.  She has not required evaluation urgently for medical issues nor has she been hospitalized.  Her principal medical problem continues to be chronic edema.  Prior to Admission medications   Medication Sig Start Date End Date Taking? Authorizing Provider  atorvastatin (LIPITOR) 40 MG tablet Take 2 tablets (80 mg total) by mouth daily. 08/28/10  Yes Kathlen Brunswick, MD  bisoprolol (ZEBETA) 10 MG tablet Take 10 mg by mouth 2 (two) times daily.     Yes Historical Provider, MD  furosemide (LASIX) 80 MG tablet Take 80 mg by mouth 2 (two) times daily.     Yes Historical Provider, MD  potassium chloride SA (K-DUR,KLOR-CON) 20 MEQ tablet Take 20 mEq by mouth 2 (two) times daily.     Yes Historical Provider, MD  valsartan (DIOVAN) 160 MG tablet Take 160 mg by mouth daily.     Yes Historical Provider, MD  warfarin (COUMADIN) 5 MG tablet Take 1 tablet (5 mg total) by mouth as directed. 08/13/11  Yes Kathlen Brunswick, MD   No Known Allergies    Past medical history, social history, and family history reviewed and updated.  ROS: Denies orthopnea, PND, palpitations, syncope, exertional dyspnea or chest discomfort.  She has no problems with constipation nor has she had skin ulceration in the lower extremities.  All other systems reviewed and are negative.  PHYSICAL EXAM: BP 135/63  Pulse 69  Resp 16  Ht 5\' 2"  (1.575 m)  Wt 87.091 kg (192 lb)  BMI 35.12 kg/m2  General-Well developed; no acute distress Body habitus-Moderately overweight Neck- +JVD vs arterial pulsation; no carotid bruits, Normal carotid upstrokes Lungs-A few coarse basilar breath sounds; resonant to percussion Cardiovascular-normal  PMI; normal S1 and S2; irregular rhythm with apical heart rate of 70 Abdomen-normal bowel sounds; soft and non-tender without masses or organomegaly Musculoskeletal-No deformities, no cyanosis or clubbing Neurologic-Normal cranial nerves; symmetric strength and tone Skin-Warm, no significant lesions Extremities-distal pulses intact; 2+ pretibial and ankle edema; marked varicosities of both legs below the knees  ASSESSMENT AND PLAN:  [place] Bing, MD 08/30/2011 11:29 AM

## 2011-08-30 NOTE — Patient Instructions (Signed)
Your physician recommends that you schedule a follow-up appointment in: 1 - 4 months with Dr Dietrich Pates 2 - 1 month with Cardiology nurse for rhythm strip and blood pressure check  Your physician has recommended you make the following change in your medication:  1 - STOP Diltiazem (Cardizem) 2 - START Verapamil 240 mg daily  Stool cards x 3 and return to office as soon as possible  Your physician recommends that you return for lab work in: This week

## 2011-08-30 NOTE — Assessment & Plan Note (Signed)
Currently no symptoms to suggest progression of coronary artery disease.  We will continue to optimally manage her cardiovascular risk factors.

## 2011-08-30 NOTE — Assessment & Plan Note (Addendum)
Most recent lipid profile 4 months ago reveals excellent control of hyperlipidemia follow in an increase in her dose of atorvastatin; current therapy will be continued.

## 2011-08-30 NOTE — Assessment & Plan Note (Signed)
INR has been stable and therapeutic.  No clinical evidence for blood loss.  Occult GI loss will be excluded by checking a CBC and stool Hemoccults.  She denies any prior colonoscopy nor is she interested in proceeding with that test.

## 2011-08-30 NOTE — Assessment & Plan Note (Addendum)
Patient reports fairly good compliance with low salt diet and leg elevation.  She was warned by her vascular surgeon not to wear knee-high compression stockings. He will reevaluate venous disease at her next office appointment in approximately 2 months and determine whether intervention in the lower extremities would be desirable and appropriate.

## 2011-08-30 NOTE — Progress Notes (Deleted)
Name: [name]    DOB: 1932/06/26  Age: 78 y.o.  MR#: [id number]       PCP:  Colette Ribas, MD, MD      Insurance: @PAYORNAME @   CC:    Chief Complaint  Patient presents with  . Appointment    edema LE -meds/lsit    VS BP 135/63  Pulse 69  Resp 16  Ht 5\' 2"  (1.575 m)  Wt 192 lb (87.091 kg)  BMI 35.12 kg/m2  Weights Current Weight  08/30/11 192 lb (87.091 kg)  08/28/10 195 lb (88.451 kg)  07/26/10 197 lb (89.359 kg)    Blood Pressure  BP Readings from Last 3 Encounters:  08/30/11 135/63  08/28/10 135/71  07/26/10 122/65     Admit date:  (Not on file) Last encounter with RMR:  08/13/2011   Allergy No Known Allergies  Current Outpatient Prescriptions  Medication Sig Dispense Refill  . atorvastatin (LIPITOR) 40 MG tablet Take 2 tablets (80 mg total) by mouth daily.  30 tablet  6  . bisoprolol (ZEBETA) 10 MG tablet Take 10 mg by mouth 2 (two) times daily.        Marland Kitchen diltiazem (CARDIZEM CD) 240 MG 24 hr capsule take 1 capsule by mouth once daily  30 capsule  3  . furosemide (LASIX) 80 MG tablet Take 80 mg by mouth 2 (two) times daily.        . potassium chloride SA (K-DUR,KLOR-CON) 20 MEQ tablet Take 20 mEq by mouth 2 (two) times daily.        . valsartan (DIOVAN) 160 MG tablet Take 160 mg by mouth daily.        Marland Kitchen warfarin (COUMADIN) 5 MG tablet Take 1 tablet (5 mg total) by mouth as directed.  45 tablet  3  . DISCONTD: warfarin (COUMADIN) 5 MG tablet         Discontinued Meds:    Medications Discontinued During This Encounter  Medication Reason  . warfarin (COUMADIN) 5 MG tablet Error    Patient Active Problem List  Diagnoses  . Atrial fibrillation  . ATHEROSCLEROTIC CARDIOVASCULAR DISEASE  . PERIPHERAL VASCULAR DISEASE  . Chronic venous insufficiency  . Chronic anticoagulation  . Tobacco abuse, in remission  . Cerebrovascular disease  . Hypertension  . Venous insufficiency (chronic) (peripheral)  . Obesity  . Hyperlipidemia     LABS Anti-coag visit on 08/15/2011  Component Date Value  . INR 08/15/2011 2.2   Anti-coag visit on 07/04/2011  Component Date Value  . INR 07/04/2011 2.0      Results for this Opt Visit:     Results for orders placed in visit on 08/15/11  POCT INR      Component Value Range   INR 2.2      EKG Orders placed in visit on 07/03/10  . CONVERTED CEMR EKG     Prior Assessment and Plan Problem List as of 08/30/2011          Cardiology Problems   Atrial fibrillation   Last Assessment & Plan Note   08/28/2010 Office Visit Signed 08/28/2010  2:14 PM by Kathlen Brunswick, MD    Doing well with a rate control strategy and anticoagulation.    ATHEROSCLEROTIC CARDIOVASCULAR DISEASE   Last Assessment & Plan Note   08/28/2010 Office Visit Signed 08/28/2010  2:12 PM by Kathlen Brunswick, MD    There have been no symptoms to suggest progression of coronary disease since intervention nearly 10 years  ago.  Efforts to optimally controlled vascular risk factors will be continued.    PERIPHERAL VASCULAR DISEASE   Last Assessment & Plan Note   07/26/2010 Office Visit Signed 07/26/2010 12:19 PM by Dyann Kief, PA    Patient has had previous bilateral carotid endarterectomies. She does have a right carotid bruit. She has follow-up with Dr. Durwin Nora in June.    Chronic venous insufficiency   Last Assessment & Plan Note   08/28/2010 Office Visit Addendum 09/03/2010 10:23 PM by Kathlen Brunswick, MD    Patient has impressive varicosities around the knee and surface varicosities at the ankle.  I have suggested that Dr. Edilia Bo evaluate this issue at her next visit to determine if intervention is warranted.  She has looked into obtaining compression stockings, but was advised that knee-high models would not be helpful.  Thigh-high stockings would be difficult for her to manage.    Cerebrovascular disease   Last Assessment & Plan Note   08/28/2010 Office Visit Signed 08/28/2010  2:15 PM by Kathlen Brunswick, MD    Serial carotid ultrasounds and clinical followup by Dr. Edilia Bo.  Appointment is scheduled for later this year.    Hypertension   Venous insufficiency (chronic) (peripheral)   Hyperlipidemia   Last Assessment & Plan Note   08/28/2010 Office Visit Signed 08/28/2010  2:18 PM by Kathlen Brunswick, MD    Control of hyperlipidemia is good but not optimal.  Atorvastatin will be increased to 80 mg q.d. With a repeat lipid profile in one month.      Other   Chronic anticoagulation   Tobacco abuse, in remission   Obesity       Imaging: No results found.   FRS Calculation: Score not calculated. Missing: Total Cholesterol

## 2011-08-30 NOTE — Assessment & Plan Note (Signed)
Cerebrovascular disease is followed by Dr. Edilia Bo.  A message was sent to him requesting that he evaluate venous insufficiency of the lower extremities when she returns to see him in July to determine if any intervention is required.  In the absence of severe symptoms or skin breakdown, I am not inclined to further increase her dose of diuretics.

## 2011-08-30 NOTE — Assessment & Plan Note (Signed)
Blood pressure control is good; current medication will be continued except for substitution of verapamil for diltiazem as noted above.

## 2011-08-30 NOTE — Assessment & Plan Note (Signed)
Heart rate is well controlled; however, treatment with diltiazem could be contributing to peripheral edema.  Verapamil will be substituted at the same dose of 240 mg per day.  Heart rate control will be reassessed in one month.

## 2011-09-03 ENCOUNTER — Encounter (INDEPENDENT_AMBULATORY_CARE_PROVIDER_SITE_OTHER): Payer: Medicare Other

## 2011-09-03 ENCOUNTER — Other Ambulatory Visit: Payer: Self-pay

## 2011-09-03 ENCOUNTER — Telehealth: Payer: Self-pay | Admitting: Cardiology

## 2011-09-03 DIAGNOSIS — Z7901 Long term (current) use of anticoagulants: Secondary | ICD-10-CM | POA: Diagnosis not present

## 2011-09-03 NOTE — Telephone Encounter (Signed)
Patient states that she can not take Verapamil.  Wants to see Dr.Rothbart today.  I told her I would have the nurse to call her. / tg

## 2011-09-03 NOTE — Telephone Encounter (Signed)
Patient states having flu-like symptoms, to include fatigue and diarrhea after two doses of verapamil.  States that she did not take it this morning or last night and symptoms have subsided, however heart rate is in the 90's with BP 110-120 systolic.  Please advise.

## 2011-09-05 ENCOUNTER — Other Ambulatory Visit: Payer: Self-pay | Admitting: *Deleted

## 2011-09-05 MED ORDER — DIGOXIN 250 MCG PO TABS: 0.2500 mg | ORAL_TABLET | Freq: Every day | ORAL | Status: DC

## 2011-09-05 NOTE — Telephone Encounter (Signed)
Patient notified of recommendations and verbalizes understanding. 

## 2011-09-05 NOTE — Telephone Encounter (Signed)
Discontinue Verapamil. Digoxin 0.25 mg per day Rhythm strip in 2 weeks.

## 2011-09-19 ENCOUNTER — Ambulatory Visit (INDEPENDENT_AMBULATORY_CARE_PROVIDER_SITE_OTHER): Payer: Medicare Other

## 2011-09-19 VITALS — BP 168/88 | HR 62 | Ht 62.0 in | Wt 187.0 lb

## 2011-09-19 DIAGNOSIS — I10 Essential (primary) hypertension: Secondary | ICD-10-CM | POA: Diagnosis not present

## 2011-09-19 NOTE — Progress Notes (Signed)
**Note De-Identified Arjuna Doeden Obfuscation** S: Pt. Arrives in office for a 1 month BP check and rhythm strip. B: At last OV with Dr. Dietrich Pates on 5-16 pt. Was advised to stop taking Diltiazem (due to Diltiazem may be contributing to peripheral edema) and to start taking Verapamil 240 mg daily,  to complete 3 hemoccult cards, and to come to today's BP check with rhythm strip. A: Pt. has no complaints at this time. BP today is 168/88 and at last OV BP was 135/63. She did bring her BP diary to this visit and all 15 recordings are lower than today's. We have calibrated her BP cuff in the past and it was similar to our's. Rhythm strip also obtained. Both rhythm strip and BP dairy pinned to Dr. Marvel Plan desk at nurses station.  R: Pt. Is advised to continue current medical treatment and that we will contact her with Dr. Marvel Plan recommendations./LV

## 2011-09-20 ENCOUNTER — Encounter: Payer: Self-pay | Admitting: Cardiology

## 2011-09-26 ENCOUNTER — Ambulatory Visit (INDEPENDENT_AMBULATORY_CARE_PROVIDER_SITE_OTHER): Payer: Medicare Other | Admitting: *Deleted

## 2011-09-26 DIAGNOSIS — I4891 Unspecified atrial fibrillation: Secondary | ICD-10-CM | POA: Diagnosis not present

## 2011-09-26 DIAGNOSIS — Z7901 Long term (current) use of anticoagulants: Secondary | ICD-10-CM

## 2011-09-26 LAB — POCT INR: INR: 2.7

## 2011-09-27 NOTE — Progress Notes (Signed)
**Note De-identified Melvin Marmo Obfuscation** Pt. advised, she verbalized understanding./LV 

## 2011-09-27 NOTE — Progress Notes (Signed)
Patient ID: [name], female   DOB: 01/06/1933, 78 y.o.   MRN: [id number]  15 determinations a blood pressure returned; systolic blood pressure no greater than 147; all diastolic pressures less than 70.  Pressure today is 168 systolic.  The patient appears to have white coat hypertension with adequate control at home.  Current antihypertensive regime will be continued.  EKG: Atrial fibrillation with controlled ventricular response and a ventricular rate of 60 bpm; heart rates estimated by sphygmomanometer range from 49-80.  There is adequate control of heart rate tending to be bradycardic side.  In the absence of symptoms potentially related to bradycardia, current rate control medications will be continued.

## 2011-09-28 ENCOUNTER — Other Ambulatory Visit: Payer: Self-pay | Admitting: Cardiology

## 2011-09-30 ENCOUNTER — Other Ambulatory Visit: Payer: Self-pay | Admitting: Cardiology

## 2011-10-22 ENCOUNTER — Other Ambulatory Visit: Payer: Self-pay | Admitting: Cardiology

## 2011-10-23 ENCOUNTER — Encounter: Payer: Self-pay | Admitting: Neurosurgery

## 2011-10-24 ENCOUNTER — Encounter: Payer: Self-pay | Admitting: Neurosurgery

## 2011-10-24 ENCOUNTER — Ambulatory Visit (INDEPENDENT_AMBULATORY_CARE_PROVIDER_SITE_OTHER): Payer: Medicare Other | Admitting: Neurosurgery

## 2011-10-24 ENCOUNTER — Ambulatory Visit (INDEPENDENT_AMBULATORY_CARE_PROVIDER_SITE_OTHER): Payer: Medicare Other | Admitting: *Deleted

## 2011-10-24 ENCOUNTER — Ambulatory Visit: Payer: Medicare Other | Admitting: Neurosurgery

## 2011-10-24 ENCOUNTER — Other Ambulatory Visit: Payer: Medicare Other

## 2011-10-24 VITALS — BP 150/60 | HR 50 | Resp 14 | Ht 62.0 in | Wt 187.8 lb

## 2011-10-24 DIAGNOSIS — Z48812 Encounter for surgical aftercare following surgery on the circulatory system: Secondary | ICD-10-CM

## 2011-10-24 DIAGNOSIS — I6529 Occlusion and stenosis of unspecified carotid artery: Secondary | ICD-10-CM

## 2011-10-24 NOTE — Progress Notes (Signed)
[facility] Carotid Office Note  CC: Carotid duplex for surveillance of known stenosis Referring Physician: Edilia Bo  History of Present Illness: 78 year old female patient of Dr. Edilia Bo status post left CEA in June of 2010 as well as right CEA in 2005. Patient reports no signs or symptoms of CVA, TIA, amaurosis fugax or any neural deficit. The patient denies any new medical diagnoses or recent surgeries. The patient does state that her varicose veins bother her and Dr. Dietrich Pates was to call Dr. Edilia Bo to discuss possible treatment.  Past Medical History  Diagnosis Date  . Arteriosclerotic cardiovascular disease (ASCVD)     RCA stent in 2003; residual 60% mid Cx and M2; negative stress nuclear in 2003  . Cerebrovascular disease     right carotid endarectomy in 05/2004; Duplex in 6/11-no significant focal stenosis; patent RCA endarterectomy site  . Hypertension   . Hyperlipidemia   . PVD (peripheral vascular disease) 2004    status post bilateral renal artery stents-2004  . Tobacco abuse, in remission     40 pack year stopped in 1989  . Atrial fibrillation   . Venous insufficiency (chronic) (peripheral)     pedal edema  . Chronic anticoagulation   . Obesity     ROS: [x]  Positive   [ ]  Denies    General: [ ]  Weight loss, [ ]  Fever, [ ]  chills Neurologic: [x ] Dizziness, [ ]  Blackouts, [ ]  Seizure [ ]  Stroke, [ ]  "Mini stroke", [ ]  Slurred speech, [ ]  Temporary blindness; [ ]  weakness in arms or legs, [ ]  Hoarseness Cardiac: [ ]  Chest pain/pressure, [ ]  Shortness of breath at rest [ ]  Shortness of breath with exertion, [ ]  Atrial fibrillation or irregular heartbeat Vascular: [ ]  Pain in legs with walking, [x ] Pain in legs at rest, [ ]  Pain in legs at night,  [ ]  Non-healing ulcer, [ ]  Blood clot in vein/DVT,   Pulmonary: [ ]  Home oxygen, [ ]  Productive cough, [ ]  Coughing up blood, [ ]  Asthma,  [ ]  Wheezing Musculoskeletal:  [ ]  Arthritis, [ ]  Low back  pain, [ ]  Joint pain Hematologic: [ ]  Easy Bruising, [ ]  Anemia; [ ]  Hepatitis Gastrointestinal: [ ]  Blood in stool, [ ]  Gastroesophageal Reflux/heartburn, [ ]  Trouble swallowing Urinary: [ ]  chronic Kidney disease, [ ]  on HD - [ ]  MWF or [ ]  TTHS, [ ]  Burning with urination, [ ]  Difficulty urinating Skin: [ ]  Rashes, [ ]  Wounds Psychological: [ ]  Anxiety, [ ]  Depression   Social History History  Substance Use Topics  . Smoking status: Former Smoker    Types: Cigarettes    Quit date: 04/30/1987  . Smokeless tobacco: Never Used  . Alcohol Use: No    Family History History reviewed. No pertinent family history.  No Known Allergies  Current Outpatient Prescriptions  Medication Sig Dispense Refill  . atorvastatin (LIPITOR) 40 MG tablet Take 2 tablets (80 mg total) by mouth daily.  30 tablet  6  . atorvastatin (LIPITOR) 80 MG tablet TAKE 1 TABLET BY MOUTH AT BEDTIME  60 tablet  6  . bisoprolol (ZEBETA) 10 MG tablet Take 10 mg by mouth 2 (two) times daily.        . digoxin (LANOXIN) 0.25 MG tablet Take 1 tablet (0.25 mg total) by mouth daily.  30 tablet  12  . furosemide (LASIX) 80 MG tablet Take 80 mg by mouth 2 (two) times daily.        Marland Kitchen  potassium chloride SA (K-DUR,KLOR-CON) 20 MEQ tablet Take 20 mEq by mouth 2 (two) times daily.        . valsartan (DIOVAN) 160 MG tablet Take 160 mg by mouth daily.        Marland Kitchen warfarin (COUMADIN) 5 MG tablet Take 1 tablet (5 mg total) by mouth as directed.  45 tablet  3    Physical Examination  Filed Vitals:   10/24/11 1129  BP: 150/60  Pulse: 50  Resp:     Body mass index is 34.35 kg/(m^2).  General:  WDWN in NAD Gait: Normal HEENT: WNL Eyes: Pupils equal Pulmonary: normal non-labored breathing , without Rales, rhonchi,  wheezing Cardiac: RRR, without  Murmurs, rubs or gallops; Abdomen: soft, NT, no masses Skin: no rashes, ulcers noted  Vascular Exam Pulses: 2+ radial pulses bilaterally Carotid bruits: Carotid pulses to  auscultation no bruits are heard Extremities without ischemic changes, no Gangrene , no cellulitis; no open wounds;  Musculoskeletal: no muscle wasting or atrophy   Neurologic: A&O X 3; Appropriate Affect ; SENSATION: normal; MOTOR FUNCTION:  moving all extremities equally. Speech is fluent/normal  Non-Invasive Vascular Imaging CAROTID DUPLEX 10/24/2011  Right ICA 20 - 39 % stenosis Left ICA 40 - 59 % stenosis   ASSESSMENT/PLAN: Asymptomatic carotid stenosis and will remain on one year surveillance. Dr. Edilia Bo spoke with patient at length about varicose veins and the treatment options. The patient has decided to think about this and will let us know if she wants to proceed or not with varicose vein treatment regimen. Otherwise we'll follow her in one year with repeat carotid duplex. Her questions were encouraged and answered.  Lauree Chandler ANP   Clinic MD: Edilia Bo

## 2011-10-31 NOTE — Procedures (Unsigned)
CAROTID DUPLEX EXAM  INDICATION:  Carotid endarterectomy.  HISTORY: Diabetes:  No Cardiac:  Stent Hypertension:  Yes Smoking:  Previous Previous Surgery:  Left carotid endarterectomy on 09/29/2008, right carotid endarterectomy in 2005 CV History:  Currently asymptomatic Amaurosis Fugax No, Paresthesias No, Hemiparesis No                                      RIGHT             LEFT Brachial systolic pressure:         144               142 Brachial Doppler waveforms:         Normal            Normal Vertebral direction of flow:        Antegrade         Antegrade DUPLEX VELOCITIES (cm/sec) CCA peak systolic                   75                89 ECA peak systolic                   110               200 ICA peak systolic                   110               164 ICA end diastolic                   8                 13 PLAQUE MORPHOLOGY:                  Heterogeneous     Heterogeneous PLAQUE AMOUNT:                      Mild              Mild PLAQUE LOCATION:                    ICA/CCA           ECA/CCA  IMPRESSION: 1. Patent bilateral carotid endarterectomy sites. 2. Doppler velocities suggest 1% to 39% stenosis of the right internal     carotid artery. 3. Moderately elevated velocities noted in the left internal carotid     artery, however, no significant plaque formation was noted within     the internal carotid artery. 4. No significant change noted in the bilateral carotid arteries when     compared to the previous exam on 10/09/2010.  ___________________________________________ Di Kindle. Edilia Bo, M.D.  CH/MEDQ  D:  10/26/2011  T:  10/26/2011  Job:  [id number]

## 2011-11-07 ENCOUNTER — Ambulatory Visit (INDEPENDENT_AMBULATORY_CARE_PROVIDER_SITE_OTHER): Payer: Medicare Other | Admitting: *Deleted

## 2011-11-07 DIAGNOSIS — Z7901 Long term (current) use of anticoagulants: Secondary | ICD-10-CM

## 2011-11-07 DIAGNOSIS — I4891 Unspecified atrial fibrillation: Secondary | ICD-10-CM | POA: Diagnosis not present

## 2011-11-07 LAB — POCT INR: INR: 3.7

## 2011-11-14 ENCOUNTER — Other Ambulatory Visit: Payer: Medicare Other

## 2011-11-14 ENCOUNTER — Ambulatory Visit: Payer: Medicare Other | Admitting: Neurosurgery

## 2011-11-26 ENCOUNTER — Ambulatory Visit (INDEPENDENT_AMBULATORY_CARE_PROVIDER_SITE_OTHER): Payer: Medicare Other | Admitting: *Deleted

## 2011-11-26 DIAGNOSIS — Z7901 Long term (current) use of anticoagulants: Secondary | ICD-10-CM | POA: Diagnosis not present

## 2011-11-26 DIAGNOSIS — I4891 Unspecified atrial fibrillation: Secondary | ICD-10-CM

## 2011-12-24 ENCOUNTER — Ambulatory Visit (INDEPENDENT_AMBULATORY_CARE_PROVIDER_SITE_OTHER): Payer: Medicare Other | Admitting: *Deleted

## 2011-12-24 DIAGNOSIS — I4891 Unspecified atrial fibrillation: Secondary | ICD-10-CM

## 2011-12-24 DIAGNOSIS — Z7901 Long term (current) use of anticoagulants: Secondary | ICD-10-CM

## 2011-12-24 LAB — POCT INR: INR: 2.3

## 2011-12-31 ENCOUNTER — Encounter: Payer: Medicare Other | Admitting: Cardiology

## 2011-12-31 ENCOUNTER — Encounter: Payer: Self-pay | Admitting: Cardiology

## 2011-12-31 NOTE — Progress Notes (Signed)
Name: [name]    DOB: October 18, 1932  Age: 78 y.o.  MR#: [id number]       PCP:  Colette Ribas, MD      Insurance: @PAYORNAME @   CC:    Chief Complaint  Patient presents with  . Appointment    some tiredness    VS BP 126/62  Pulse 54  Ht 5\' 2"  (1.575 m)  Wt 179 lb (81.194 kg)  BMI 32.74 kg/m2  SpO2 97%  Weights Current Weight  12/31/11 179 lb (81.194 kg)  10/24/11 187 lb 12.8 oz (85.186 kg)  09/19/11 187 lb (84.823 kg)    Blood Pressure  BP Readings from Last 3 Encounters:  12/31/11 126/62  10/24/11 150/60  09/19/11 168/88     Admit date:  (Not on file) Last encounter with RMR:  10/22/2011   Allergy No Known Allergies  Current Outpatient Prescriptions  Medication Sig Dispense Refill  . atorvastatin (LIPITOR) 80 MG tablet Take 80 mg by mouth at bedtime.      . bisoprolol (ZEBETA) 10 MG tablet Take 10 mg by mouth 2 (two) times daily.        . digoxin (LANOXIN) 0.25 MG tablet Take 1 tablet (0.25 mg total) by mouth daily.  30 tablet  12  . furosemide (LASIX) 80 MG tablet Take 80 mg by mouth 2 (two) times daily.        . potassium chloride SA (K-DUR,KLOR-CON) 20 MEQ tablet Take 20 mEq by mouth 2 (two) times daily.        . valsartan (DIOVAN) 160 MG tablet Take 160 mg by mouth daily.        Marland Kitchen warfarin (COUMADIN) 5 MG tablet Take 1 tablet (5 mg total) by mouth as directed.  45 tablet  3    Discontinued Meds:    Medications Discontinued During This Encounter  Medication Reason  . atorvastatin (LIPITOR) 80 MG tablet Duplicate  . atorvastatin (LIPITOR) 40 MG tablet Duplicate    Patient Active Problem List  Diagnosis  . Atrial fibrillation  . ATHEROSCLEROTIC CARDIOVASCULAR DISEASE  . PERIPHERAL VASCULAR DISEASE  . Chronic venous insufficiency  . Chronic anticoagulation  . Tobacco abuse, in remission  . Cerebrovascular disease  . Hypertension  . Obesity  . Hyperlipidemia  . Occlusion and stenosis of carotid artery without mention of cerebral infarction      LABS Anti-coag visit on 12/24/2011  Component Date Value  . INR 12/24/2011 2.3   Anti-coag visit on 11/26/2011  Component Date Value  . INR 11/26/2011 2.6   Anti-coag visit on 11/07/2011  Component Date Value  . INR 11/07/2011 3.7      Results for this Opt Visit:     Results for orders placed in visit on 12/24/11  POCT INR      Component Value Range   INR 2.3      EKG Orders placed in visit on 02/18/11  . RHYTHM STRIP     Prior Assessment and Plan Problem List as of 12/31/2011            Cardiology Problems   Atrial fibrillation   Last Assessment & Plan Note   08/30/2011 Office Visit Signed 08/30/2011 11:35 AM by Kathlen Brunswick, MD    Heart rate is well controlled; however, treatment with diltiazem could be contributing to peripheral edema.  Verapamil will be substituted at the same dose of 240 mg per day.  Heart rate control will be reassessed in one month.  ATHEROSCLEROTIC CARDIOVASCULAR DISEASE   Last Assessment & Plan Note   08/30/2011 Office Visit Signed 08/30/2011 11:34 AM by Kathlen Brunswick, MD    Currently no symptoms to suggest progression of coronary artery disease.  We will continue to optimally manage her cardiovascular risk factors.    PERIPHERAL VASCULAR DISEASE   Last Assessment & Plan Note   07/26/2010 Office Visit Signed 07/26/2010 12:19 PM by Dyann Kief, PA    Patient has had previous bilateral carotid endarterectomies. She does have a right carotid bruit. She has follow-up with Dr. Durwin Nora in June.    Chronic venous insufficiency   Last Assessment & Plan Note   08/30/2011 Office Visit Addendum 09/03/2011  7:01 AM by Kathlen Brunswick, MD    Patient reports fairly good compliance with low salt diet and leg elevation.  She was warned by her vascular surgeon not to wear knee-high compression stockings. He will reevaluate venous disease at her next office appointment in approximately 2 months and determine whether intervention in the lower  extremities would be desirable and appropriate.    Cerebrovascular disease   Last Assessment & Plan Note   08/30/2011 Office Visit Signed 08/30/2011 11:36 AM by Kathlen Brunswick, MD    Cerebrovascular disease is followed by Dr. Edilia Bo.  A message was sent to him requesting that he evaluate venous insufficiency of the lower extremities when she returns to see him in July to determine if any intervention is required.  In the absence of severe symptoms or skin breakdown, I am not inclined to further increase her dose of diuretics.    Hypertension   Last Assessment & Plan Note   08/30/2011 Office Visit Signed 08/30/2011 11:39 AM by Kathlen Brunswick, MD    Blood pressure control is good; current medication will be continued except for substitution of verapamil for diltiazem as noted above.    Hyperlipidemia   Last Assessment & Plan Note   08/30/2011 Office Visit Addendum 08/30/2011 11:34 AM by Kathlen Brunswick, MD    Most recent lipid profile 4 months ago reveals excellent control of hyperlipidemia follow in an increase in her dose of atorvastatin; current therapy will be continued.    Occlusion and stenosis of carotid artery without mention of cerebral infarction     Other   Chronic anticoagulation   Last Assessment & Plan Note   08/30/2011 Office Visit Signed 08/30/2011 11:37 AM by Kathlen Brunswick, MD    INR has been stable and therapeutic.  No clinical evidence for blood loss.  Occult GI loss will be excluded by checking a CBC and stool Hemoccults.  She denies any prior colonoscopy nor is she interested in proceeding with that test.    Tobacco abuse, in remission   Obesity       Imaging: No results found.   FRS Calculation: Score not calculated. Missing: Total Cholesterol

## 2012-01-02 NOTE — Progress Notes (Signed)
This encounter was created in error - please disregard.

## 2012-01-03 ENCOUNTER — Ambulatory Visit (INDEPENDENT_AMBULATORY_CARE_PROVIDER_SITE_OTHER): Payer: Medicare Other | Admitting: Cardiology

## 2012-01-03 ENCOUNTER — Encounter: Payer: Self-pay | Admitting: Cardiology

## 2012-01-03 VITALS — BP 161/73 | HR 53 | Ht 62.0 in | Wt 178.1 lb

## 2012-01-03 DIAGNOSIS — I10 Essential (primary) hypertension: Secondary | ICD-10-CM | POA: Diagnosis not present

## 2012-01-03 DIAGNOSIS — I679 Cerebrovascular disease, unspecified: Secondary | ICD-10-CM

## 2012-01-03 DIAGNOSIS — Z7901 Long term (current) use of anticoagulants: Secondary | ICD-10-CM | POA: Diagnosis not present

## 2012-01-03 DIAGNOSIS — E785 Hyperlipidemia, unspecified: Secondary | ICD-10-CM

## 2012-01-03 MED ORDER — TERAZOSIN HCL 5 MG PO CAPS: 5.0000 mg | ORAL_CAPSULE | Freq: Every day | ORAL | Status: DC

## 2012-01-03 MED ORDER — TERAZOSIN HCL 1 MG PO CAPS: 1.0000 mg | ORAL_CAPSULE | Freq: Every day | ORAL | Status: DC

## 2012-01-03 MED ORDER — DIGOXIN 125 MCG PO TABS: 0.1250 mg | ORAL_TABLET | Freq: Every day | ORAL | Status: DC

## 2012-01-03 NOTE — Assessment & Plan Note (Signed)
Extremely low total and LDL cholesterol.  Current therapy is adequate, and will be continued.

## 2012-01-03 NOTE — Assessment & Plan Note (Addendum)
Blood pressure control is suboptimal, both in the office and by patient report at home.  Hytrin will be added to her medical regime and titrated with a return visit to the cardiology nurses anticipated in one month.  Home blood pressure measurements will be obtained by the patient in the interim and reviewed at that time.

## 2012-01-03 NOTE — Progress Notes (Deleted)
Name: [name]    DOB: Jun 12, 1932  Age: 78 y.o.  MR#: [id number]       PCP:  Colette Ribas, MD      Insurance: @PAYORNAME @   CC:   No chief complaint on file.   VS BP 161/73  Pulse 53  Ht 5\' 2"  (1.575 m)  Wt 178 lb 1.9 oz (80.795 kg)  BMI 32.58 kg/m2  SpO2 97%  Weights Current Weight  01/03/12 178 lb 1.9 oz (80.795 kg)  12/31/11 179 lb (81.194 kg)  10/24/11 187 lb 12.8 oz (85.186 kg)    Blood Pressure  BP Readings from Last 3 Encounters:  01/03/12 161/73  12/31/11 126/62  10/24/11 150/60     Admit date:  (Not on file) Last encounter with RMR:  12/31/2011   Allergy No Known Allergies  Current Outpatient Prescriptions  Medication Sig Dispense Refill  . atorvastatin (LIPITOR) 80 MG tablet Take 80 mg by mouth at bedtime.      . bisoprolol (ZEBETA) 10 MG tablet Take 10 mg by mouth 2 (two) times daily.        . digoxin (LANOXIN) 0.25 MG tablet Take 1 tablet (0.25 mg total) by mouth daily.  30 tablet  12  . furosemide (LASIX) 80 MG tablet Take 80 mg by mouth 2 (two) times daily.        . potassium chloride SA (K-DUR,KLOR-CON) 20 MEQ tablet Take 20 mEq by mouth 2 (two) times daily.        . valsartan (DIOVAN) 160 MG tablet Take 160 mg by mouth daily.        Marland Kitchen warfarin (COUMADIN) 5 MG tablet Take 1 tablet (5 mg total) by mouth as directed.  45 tablet  3    Discontinued Meds:   There are no discontinued medications.  Patient Active Problem List  Diagnosis  . Atrial fibrillation  . ATHEROSCLEROTIC CARDIOVASCULAR DISEASE  . PERIPHERAL VASCULAR DISEASE  . Chronic venous insufficiency  . Chronic anticoagulation  . Tobacco abuse, in remission  . Cerebrovascular disease  . Hypertension  . Obesity  . Hyperlipidemia  . Occlusion and stenosis of carotid artery without mention of cerebral infarction    LABS Anti-coag visit on 12/24/2011  Component Date Value  . INR 12/24/2011 2.3   Anti-coag visit on 11/26/2011  Component Date Value  . INR 11/26/2011 2.6     Anti-coag visit on 11/07/2011  Component Date Value  . INR 11/07/2011 3.7      Results for this Opt Visit:     Results for orders placed in visit on 12/24/11  POCT INR      Component Value Range   INR 2.3      EKG Orders placed in visit on 02/18/11  . RHYTHM STRIP     Prior Assessment and Plan Problem List as of 01/03/2012            Cardiology Problems   Atrial fibrillation   Last Assessment & Plan Note   08/30/2011 Office Visit Signed 08/30/2011 11:35 AM by Kathlen Brunswick, MD    Heart rate is well controlled; however, treatment with diltiazem could be contributing to peripheral edema.  Verapamil will be substituted at the same dose of 240 mg per day.  Heart rate control will be reassessed in one month.    ATHEROSCLEROTIC CARDIOVASCULAR DISEASE   Last Assessment & Plan Note   08/30/2011 Office Visit Signed 08/30/2011 11:34 AM by Kathlen Brunswick, MD    Currently no  symptoms to suggest progression of coronary artery disease.  We will continue to optimally manage her cardiovascular risk factors.    PERIPHERAL VASCULAR DISEASE   Last Assessment & Plan Note   07/26/2010 Office Visit Signed 07/26/2010 12:19 PM by Dyann Kief, PA    Patient has had previous bilateral carotid endarterectomies. She does have a right carotid bruit. She has follow-up with Dr. Durwin Nora in June.    Chronic venous insufficiency   Last Assessment & Plan Note   08/30/2011 Office Visit Addendum 09/03/2011  7:01 AM by Kathlen Brunswick, MD    Patient reports fairly good compliance with low salt diet and leg elevation.  She was warned by her vascular surgeon not to wear knee-high compression stockings. He will reevaluate venous disease at her next office appointment in approximately 2 months and determine whether intervention in the lower extremities would be desirable and appropriate.    Cerebrovascular disease   Last Assessment & Plan Note   08/30/2011 Office Visit Signed 08/30/2011 11:36 AM by Kathlen Brunswick, MD    Cerebrovascular disease is followed by Dr. Edilia Bo.  A message was sent to him requesting that he evaluate venous insufficiency of the lower extremities when she returns to see him in July to determine if any intervention is required.  In the absence of severe symptoms or skin breakdown, I am not inclined to further increase her dose of diuretics.    Hypertension   Last Assessment & Plan Note   08/30/2011 Office Visit Signed 08/30/2011 11:39 AM by Kathlen Brunswick, MD    Blood pressure control is good; current medication will be continued except for substitution of verapamil for diltiazem as noted above.    Hyperlipidemia   Last Assessment & Plan Note   08/30/2011 Office Visit Addendum 08/30/2011 11:34 AM by Kathlen Brunswick, MD    Most recent lipid profile 4 months ago reveals excellent control of hyperlipidemia follow in an increase in her dose of atorvastatin; current therapy will be continued.    Occlusion and stenosis of carotid artery without mention of cerebral infarction     Other   Chronic anticoagulation   Last Assessment & Plan Note   08/30/2011 Office Visit Signed 08/30/2011 11:37 AM by Kathlen Brunswick, MD    INR has been stable and therapeutic.  No clinical evidence for blood loss.  Occult GI loss will be excluded by checking a CBC and stool Hemoccults.  She denies any prior colonoscopy nor is she interested in proceeding with that test.    Tobacco abuse, in remission   Obesity       Imaging: No results found.   FRS Calculation: Score not calculated. Missing: Total Cholesterol

## 2012-01-03 NOTE — Progress Notes (Signed)
Patient ID: [name], female   DOB: [birth year], 78 y.o.   MRN: [id number]  HPI: Scheduled return visit for this very nice woman with long-standing atrial fibrillation and hypertension.  Since her last visit, she has done superbly.  She cares for her 45-year-old grandson without difficulty, reporting no dyspnea nor chest discomfort.  She has not been hospitalized or required care in the emergency department.  She remains active without any cardiopulmonary symptoms.  Prior to Admission medications   Medication Sig Start Date End Date Taking? Authorizing Provider  atorvastatin (LIPITOR) 80 MG tablet Take 80 mg by mouth at bedtime.   Yes Historical Provider, MD  bisoprolol (ZEBETA) 10 MG tablet Take 10 mg by mouth 2 (two) times daily.     Yes Historical Provider, MD  furosemide (LASIX) 80 MG tablet Take 80 mg by mouth 2 (two) times daily.     Yes Historical Provider, MD  potassium chloride SA (K-DUR,KLOR-CON) 20 MEQ tablet Take 20 mEq by mouth 2 (two) times daily.     Yes Historical Provider, MD  valsartan (DIOVAN) 160 MG tablet Take 160 mg by mouth daily.     Yes Historical Provider, MD  warfarin (COUMADIN) 5 MG tablet Take 1 tablet (5 mg total) by mouth as directed. 08/13/11  Yes Kathlen Brunswick, MD  digoxin (LANOXIN) 0.125 MG tablet Take 1 tablet (0.125 mg total) by mouth daily. 01/03/12   Kathlen Brunswick, MD  terazosin (HYTRIN) 1 MG capsule Take 1 capsule (1 mg total) by mouth at bedtime. 1 mg = 2 caps for 7 days, then 2 caps = 2 mg for 7 days 01/03/12   Kathlen Brunswick, MD  terazosin (HYTRIN) 5 MG capsule Take 1 capsule (5 mg total) by mouth at bedtime. 01/03/12   Kathlen Brunswick, MD   Allergies  Allergen Reactions  . Diltiazem Other (See Comments)    Edema  . Verapamil Other (See Comments)    Diarrhea     Past medical history, social history, and family history reviewed and updated.  ROS: Denies orthopnea, PND, lightheadedness, palpitations or syncope.  Chronic pedal edema is  improved.  All other systems reviewed and are negative.  PHYSICAL EXAM: BP 161/73  Pulse 53  Ht 5\' 2"  (1.575 m)  Wt 80.795 kg (178 lb 1.9 oz)  BMI 32.58 kg/m2  SpO2 97%  General-Well developed; no acute distress Body habitus-Overweight Neck-No JVD; no carotid bruits Lungs-clear lung fields; resonant to percussion Cardiovascular-normal PMI; normal S1 and S2; irregular rhythm; basilar systolic ejection murmur Abdomen-normal bowel sounds; soft and non-tender without masses or organomegaly Musculoskeletal-No deformities, no cyanosis or clubbing Neurologic-Normal cranial nerves; symmetric strength and tone Skin-Warm, no significant lesions Extremities-distal pulses intact-2+ on the right, 1+ left; 1/2+ ankle edema; surface varicose veins  Rhythm Strip: Atrial fibrillation with a slow ventricular response; ventricular rate of 55 bpm  ASSESSMENT AND PLAN:.  Meriden Bing, MD 01/03/2012 3:53 PM

## 2012-01-03 NOTE — Assessment & Plan Note (Signed)
Patient has done well with warfarin therapy without evidence for occult GI blood loss.  She is not interested in any screening colonoscopy.  We will continue to monitor with serial CBCs and stool Hemoccult testing.

## 2012-01-03 NOTE — Patient Instructions (Addendum)
Your physician recommends that you schedule a follow-up appointment in:  1 - 6 months with Dr Dietrich Pates 2 - 1 month blood pressure check  Your physician recommends that you return for lab work in: Within the week  Stool cards  Your physician has requested that you regularly monitor and record your blood pressure readings at home. Please use the same machine at the same time of day to check your readings and record them to bring to your follow-up visit.  Your physician has recommended you make the following change in your medication:  1 - DECREASE Digoxin to 0.125 mg daily 2 - START Hytrin 1 mg x 7 days, 2 mg x 1 week, then 5 mg each evening thereafter.

## 2012-01-07 DIAGNOSIS — I10 Essential (primary) hypertension: Secondary | ICD-10-CM | POA: Diagnosis not present

## 2012-01-07 LAB — CBC
HCT: 43.2 % (ref 36.0–46.0)
Hemoglobin: 14.1 g/dL (ref 12.0–15.0)
MCH: 27.1 pg (ref 26.0–34.0)
MCHC: 32.6 g/dL (ref 30.0–36.0)
MCV: 83.1 fL (ref 78.0–100.0)

## 2012-01-07 LAB — COMPREHENSIVE METABOLIC PANEL
Alkaline Phosphatase: 87 U/L (ref 39–117)
BUN: 17 mg/dL (ref 6–23)
Glucose, Bld: 125 mg/dL — ABNORMAL HIGH (ref 70–99)
Total Bilirubin: 0.9 mg/dL (ref 0.3–1.2)

## 2012-01-08 ENCOUNTER — Encounter: Payer: Self-pay | Admitting: Cardiology

## 2012-01-09 ENCOUNTER — Other Ambulatory Visit: Payer: Self-pay | Admitting: *Deleted

## 2012-01-09 ENCOUNTER — Encounter: Payer: Self-pay | Admitting: *Deleted

## 2012-01-09 MED ORDER — POTASSIUM CHLORIDE CRYS ER 20 MEQ PO TBCR: 20.0000 meq | EXTENDED_RELEASE_TABLET | Freq: Every day | ORAL | Status: DC

## 2012-01-11 ENCOUNTER — Other Ambulatory Visit (INDEPENDENT_AMBULATORY_CARE_PROVIDER_SITE_OTHER): Payer: Medicare Other | Admitting: *Deleted

## 2012-01-11 ENCOUNTER — Encounter (INDEPENDENT_AMBULATORY_CARE_PROVIDER_SITE_OTHER): Payer: Medicare Other

## 2012-01-11 DIAGNOSIS — Z7901 Long term (current) use of anticoagulants: Secondary | ICD-10-CM

## 2012-01-11 DIAGNOSIS — R0989 Other specified symptoms and signs involving the circulatory and respiratory systems: Secondary | ICD-10-CM

## 2012-01-11 LAB — POC HEMOCCULT BLD/STL (HOME/3-CARD/SCREEN)
Card #3 Fecal Occult Blood, POC: NEGATIVE
Fecal Occult Blood, POC: NEGATIVE

## 2012-01-20 ENCOUNTER — Other Ambulatory Visit: Payer: Self-pay | Admitting: Cardiology

## 2012-01-21 ENCOUNTER — Ambulatory Visit (INDEPENDENT_AMBULATORY_CARE_PROVIDER_SITE_OTHER): Payer: Medicare Other | Admitting: *Deleted

## 2012-01-21 DIAGNOSIS — I4891 Unspecified atrial fibrillation: Secondary | ICD-10-CM | POA: Diagnosis not present

## 2012-01-21 DIAGNOSIS — Z7901 Long term (current) use of anticoagulants: Secondary | ICD-10-CM

## 2012-01-24 ENCOUNTER — Other Ambulatory Visit: Payer: Self-pay | Admitting: Cardiology

## 2012-01-24 MED ORDER — WARFARIN SODIUM 5 MG PO TABS: ORAL_TABLET | ORAL | Status: DC

## 2012-02-04 ENCOUNTER — Ambulatory Visit (INDEPENDENT_AMBULATORY_CARE_PROVIDER_SITE_OTHER): Payer: Medicare Other | Admitting: *Deleted

## 2012-02-04 VITALS — BP 92/50 | HR 61 | Ht 62.0 in | Wt 176.0 lb

## 2012-02-04 DIAGNOSIS — I10 Essential (primary) hypertension: Secondary | ICD-10-CM

## 2012-02-04 NOTE — Progress Notes (Signed)
Presents for a blood pressure check.  No complaints at this time.  States feeling good.  Has been taking pressures at home, however did not bring list.  States systolic numbers in the 130 range.   At OV 9/19, BP 161/73, HR 52.  At that visit, Digoxin was decreased to 0.125 mg daily and Hytrin started and is taking 5 mg daily.

## 2012-02-13 ENCOUNTER — Ambulatory Visit (INDEPENDENT_AMBULATORY_CARE_PROVIDER_SITE_OTHER): Payer: Medicare Other | Admitting: *Deleted

## 2012-02-13 DIAGNOSIS — Z7901 Long term (current) use of anticoagulants: Secondary | ICD-10-CM | POA: Diagnosis not present

## 2012-02-13 DIAGNOSIS — I4891 Unspecified atrial fibrillation: Secondary | ICD-10-CM

## 2012-02-18 NOTE — Progress Notes (Signed)
Patient ID: [name], female   DOB: April 24, 1932, 78 y.o.   MRN: [id number]  Continue current Rx.

## 2012-02-29 ENCOUNTER — Other Ambulatory Visit: Payer: Self-pay | Admitting: *Deleted

## 2012-02-29 DIAGNOSIS — I10 Essential (primary) hypertension: Secondary | ICD-10-CM

## 2012-03-03 LAB — BASIC METABOLIC PANEL
BUN: 15 mg/dL (ref 6–23)
Calcium: 10 mg/dL (ref 8.4–10.5)
Creat: 0.88 mg/dL (ref 0.50–1.10)

## 2012-03-12 ENCOUNTER — Ambulatory Visit (INDEPENDENT_AMBULATORY_CARE_PROVIDER_SITE_OTHER): Payer: Medicare Other | Admitting: *Deleted

## 2012-03-12 DIAGNOSIS — Z7901 Long term (current) use of anticoagulants: Secondary | ICD-10-CM

## 2012-03-12 DIAGNOSIS — I4891 Unspecified atrial fibrillation: Secondary | ICD-10-CM

## 2012-04-17 ENCOUNTER — Emergency Department (HOSPITAL_COMMUNITY)
Admission: EM | Admit: 2012-04-17 | Discharge: 2012-04-17 | Disposition: A | Discharge status: Home / Self Care | Payer: Medicare Other | Attending: Emergency Medicine | Admitting: Emergency Medicine

## 2012-04-17 ENCOUNTER — Encounter (HOSPITAL_COMMUNITY): Payer: Self-pay | Admitting: *Deleted

## 2012-04-17 DIAGNOSIS — Z8669 Personal history of other diseases of the nervous system and sense organs: Secondary | ICD-10-CM | POA: Insufficient documentation

## 2012-04-17 DIAGNOSIS — E669 Obesity, unspecified: Secondary | ICD-10-CM | POA: Diagnosis not present

## 2012-04-17 DIAGNOSIS — I10 Essential (primary) hypertension: Secondary | ICD-10-CM | POA: Insufficient documentation

## 2012-04-17 DIAGNOSIS — I8390 Asymptomatic varicose veins of unspecified lower extremity: Secondary | ICD-10-CM | POA: Diagnosis not present

## 2012-04-17 DIAGNOSIS — Z87891 Personal history of nicotine dependence: Secondary | ICD-10-CM | POA: Insufficient documentation

## 2012-04-17 DIAGNOSIS — Z862 Personal history of diseases of the blood and blood-forming organs and certain disorders involving the immune mechanism: Secondary | ICD-10-CM | POA: Insufficient documentation

## 2012-04-17 DIAGNOSIS — Z8679 Personal history of other diseases of the circulatory system: Secondary | ICD-10-CM | POA: Insufficient documentation

## 2012-04-17 DIAGNOSIS — Z79899 Other long term (current) drug therapy: Secondary | ICD-10-CM | POA: Diagnosis not present

## 2012-04-17 DIAGNOSIS — I739 Peripheral vascular disease, unspecified: Secondary | ICD-10-CM | POA: Insufficient documentation

## 2012-04-17 DIAGNOSIS — Z7901 Long term (current) use of anticoagulants: Secondary | ICD-10-CM | POA: Insufficient documentation

## 2012-04-17 MED ORDER — SILVER NITRATE-POT NITRATE 75-25 % EX MISC
CUTANEOUS | Status: AC
  Filled 2012-04-17: qty 2

## 2012-04-17 NOTE — ED Provider Notes (Signed)
History    This chart was scribed for [name], MD, MD by Smitty Pluck, ED Scribe. The patient was seen in room APA08 and the patient's care was started at 3:42 PM.   CSN: [id number]  Arrival date & time 04/17/12  1457      No chief complaint on file.   (Consider location/radiation/quality/duration/timing/severity/associated sxs/prior treatment) Patient is a 79 y.o. female presenting with skin laceration. The history is provided by the patient. No language interpreter was used.  Laceration  The incident occurred 3 to 5 hours ago. The laceration is located on the left foot. The patient is experiencing no pain. She reports no foreign bodies present.   [name] is a 78 y.o. female who presents to the Emergency Department complaining of bleeding varicose vein onset today. Pt reports that she was walking in kitchen when the bleeding started suddenly. She reports that she takes blood thinners. The bleeding is currently controlled. Pt denies any other pain.   Past Medical History  Diagnosis Date  . Arteriosclerotic cardiovascular disease (ASCVD)     RCA stent in 2003; residual 60% mid Cx and M2; negative stress nuclear in 2003  . Cerebrovascular disease     right carotid endarectomy in 05/2004; Duplex in 6/11-no significant focal stenosis; patent RCA endarterectomy site  . Hypertension   . Hyperlipidemia   . PVD (peripheral vascular disease) 2004    status post bilateral renal artery stents-2004  . Tobacco abuse, in remission     40 pack year stopped in 1989  . Atrial fibrillation   . Venous insufficiency (chronic) (peripheral)     pedal edema  . Chronic anticoagulation   . Obesity     Past Surgical History  Procedure Date  . Carotid endarterectomy     Right in 2005, left in 2010  . Colonoscopy Never    Declines  . Coronary angioplasty with stent placement     History reviewed. No pertinent family history.  History  Substance Use Topics  . Smoking status:  Former Smoker    Types: Cigarettes    Quit date: 04/30/1987  . Smokeless tobacco: Never Used  . Alcohol Use: No    OB History    Grav Para Term Preterm Abortions TAB SAB Ect Mult Living                  Review of Systems  Constitutional: Negative for fatigue.  HENT: Negative for congestion, sinus pressure and ear discharge.   Eyes: Negative for discharge.  Respiratory: Negative for cough.   Cardiovascular: Negative for chest pain.  Gastrointestinal: Negative for abdominal pain and diarrhea.  Genitourinary: Negative for frequency and hematuria.  Musculoskeletal: Negative for back pain.  Skin: Negative for rash.  Neurological: Negative for seizures and headaches.  Hematological: Negative.   Psychiatric/Behavioral: Negative for hallucinations.  All other systems reviewed and are negative.    Allergies  Diltiazem and Verapamil  Home Medications   Current Outpatient Rx  Name  Route  Sig  Dispense  Refill  . ATORVASTATIN CALCIUM 80 MG PO TABS   Oral   Take 80 mg by mouth at bedtime.         Marland Kitchen BISOPROLOL FUMARATE 10 MG PO TABS   Oral   Take 10 mg by mouth 2 (two) times daily.           Marland Kitchen DIGOXIN 0.125 MG PO TABS   Oral   Take 1 tablet (0.125 mg total) by mouth daily.  30 tablet   11   . FUROSEMIDE 80 MG PO TABS   Oral   Take 80 mg by mouth 2 (two) times daily.           Marland Kitchen POTASSIUM CHLORIDE CRYS ER 20 MEQ PO TBCR   Oral   Take 1 tablet (20 mEq total) by mouth daily.   30 tablet   11   . TERAZOSIN HCL 1 MG PO CAPS   Oral   Take 1 capsule (1 mg total) by mouth at bedtime. 1 mg = 2 caps for 7 days, then 2 caps = 2 mg for 7 days   21 capsule   0   . TERAZOSIN HCL 5 MG PO CAPS   Oral   Take 1 capsule (5 mg total) by mouth at bedtime.   30 capsule   11   . VALSARTAN 160 MG PO TABS   Oral   Take 160 mg by mouth daily.           . WARFARIN SODIUM 5 MG PO TABS      Take 1 tablet daily except 1 1/2 tablets on Mondays   45 tablet   3      There were no vitals taken for this visit.  Physical Exam  Nursing note and vitals reviewed. Constitutional: She is oriented to person, place, and time. She appears well-developed.  HENT:  Head: Normocephalic.  Eyes: Conjunctivae normal are normal.  Neck: No tracheal deviation present.  Cardiovascular:  No murmur heard. Musculoskeletal: Normal range of motion.       Severe varioce veins in bilateral legs   Neurological: She is oriented to person, place, and time.  Skin: Skin is warm.       Scab on top of left foot were varicose was bleeding.  No active bleeding.   Psychiatric: She has a normal mood and affect.    ED Course  Procedures (including critical care time)   COORDINATION OF CARE: 3:48 PM Discussed ED treatment with pt     Labs Reviewed - No data to display No results found.   No diagnosis found.    MDM        The chart was scribed for me under my direct supervision.  I personally performed the history, physical, and medical decision making and all procedures in the evaluation of this patient.[name], MD 04/17/12 2114

## 2012-04-17 NOTE — ED Notes (Addendum)
Bleeding from varicose vein, walking in kitchen and felt a "pop" and bleeding from lt foot.  Takes coumadin

## 2012-04-18 ENCOUNTER — Telehealth: Payer: Self-pay | Admitting: Cardiology

## 2012-04-18 NOTE — Telephone Encounter (Signed)
Patient's husband states that patient had to go to ED on 04/17/12 due to vein in her foot busting.  States that the ED did not tell them anything and wanted to know if Dr.Rothbart could tell them what they should do. / tgs

## 2012-04-18 NOTE — Telephone Encounter (Signed)
Discussed with patient and she said that site looks good.  Denies pain.  Advised her to keep an eye on it and call Dr Phillips Odor if it gets worse or any signs of infection appear. Verbalized understanding.

## 2012-04-18 NOTE — Telephone Encounter (Signed)
I sent a message to the ED physician requested that he contact the patient with further instructions.  Please ask [name] to keep the area covered with a dry dressing.  If it is small, a Band-Aid will probably be adequate.  As long as it appears to be healing well, no specific followup is necessary.

## 2012-04-18 NOTE — Telephone Encounter (Signed)
Please review ED note and advise as to wether you would like her to have a follow up appointment or if a referral is needed.

## 2012-04-23 ENCOUNTER — Ambulatory Visit (INDEPENDENT_AMBULATORY_CARE_PROVIDER_SITE_OTHER): Payer: Medicare Other | Admitting: *Deleted

## 2012-04-23 DIAGNOSIS — Z7901 Long term (current) use of anticoagulants: Secondary | ICD-10-CM | POA: Diagnosis not present

## 2012-04-23 DIAGNOSIS — Z6832 Body mass index (BMI) 32.0-32.9, adult: Secondary | ICD-10-CM | POA: Diagnosis not present

## 2012-04-23 DIAGNOSIS — I4891 Unspecified atrial fibrillation: Secondary | ICD-10-CM

## 2012-04-23 DIAGNOSIS — I739 Peripheral vascular disease, unspecified: Secondary | ICD-10-CM | POA: Diagnosis not present

## 2012-04-23 LAB — POCT INR: INR: 1.9

## 2012-04-24 ENCOUNTER — Telehealth: Payer: Self-pay | Admitting: Vascular Surgery

## 2012-04-24 ENCOUNTER — Telehealth: Payer: Self-pay

## 2012-04-24 ENCOUNTER — Other Ambulatory Visit: Payer: Self-pay | Admitting: *Deleted

## 2012-04-24 DIAGNOSIS — I83893 Varicose veins of bilateral lower extremities with other complications: Secondary | ICD-10-CM

## 2012-04-24 NOTE — Telephone Encounter (Addendum)
Message copied by Shari Prows on Thu Apr 24, 2012  1:26 PM ------      Message from: Phillips Odor      Created: Thu Apr 24, 2012 11:41 AM      Regarding: needs new eval for VV      Contact: [phone]       Please schedule pt. For vasc study for VV and office appt.  C/o recent episode of bleeding from VV top of left foot; has bilat. LE VV(seen in the ER 04/17/12)   Pt. Is followed by [facility] for Carotid disease/ per Tyson Babinski, [facility] could do the initial consult for VV.   I scheduled an appt for the above pt on 05/14/12 at 1:30pm for venous reflux study and to see [facility] at 2:30pm. I mailed new pt information to the patient and lm for her as well. awt

## 2012-04-24 NOTE — Telephone Encounter (Addendum)
Husband called to report pt. Having had episode of bleeding from blood vessel on top of left foot, one wk. ago.   Stated they put pressure on it and went to the ER for eval.  Has bilateral varicose veins.  On Coumadin.  Husband states top of left foot is slightly swollen and tender.  States the ER physician didn't order anything.  Has not had any reoccurrance of bleeding since episode 1 wk. Ago.  Requesting appt. With Dr. Edilia Bo.  Advised husband that office will call and schedule appt.  Advised if bleeding reoccurs, to hold pressure at site and elevate the extremity, until bleeding subsides.  Verb. Understanding of.

## 2012-04-28 DIAGNOSIS — I83219 Varicose veins of right lower extremity with both ulcer of unspecified site and inflammation: Secondary | ICD-10-CM | POA: Diagnosis not present

## 2012-04-28 DIAGNOSIS — R58 Hemorrhage, not elsewhere classified: Secondary | ICD-10-CM | POA: Diagnosis not present

## 2012-04-28 DIAGNOSIS — I872 Venous insufficiency (chronic) (peripheral): Secondary | ICD-10-CM | POA: Diagnosis not present

## 2012-05-01 DIAGNOSIS — R58 Hemorrhage, not elsewhere classified: Secondary | ICD-10-CM | POA: Diagnosis not present

## 2012-05-14 ENCOUNTER — Encounter: Payer: Medicare Other | Admitting: Vascular Surgery

## 2012-05-14 ENCOUNTER — Ambulatory Visit (INDEPENDENT_AMBULATORY_CARE_PROVIDER_SITE_OTHER): Payer: Medicare Other | Admitting: *Deleted

## 2012-05-14 DIAGNOSIS — Z7901 Long term (current) use of anticoagulants: Secondary | ICD-10-CM | POA: Diagnosis not present

## 2012-05-14 DIAGNOSIS — I4891 Unspecified atrial fibrillation: Secondary | ICD-10-CM

## 2012-05-14 LAB — POCT INR: INR: 2.5

## 2012-06-11 ENCOUNTER — Ambulatory Visit (INDEPENDENT_AMBULATORY_CARE_PROVIDER_SITE_OTHER): Payer: Medicare Other | Admitting: *Deleted

## 2012-06-11 DIAGNOSIS — Z7901 Long term (current) use of anticoagulants: Secondary | ICD-10-CM

## 2012-06-11 DIAGNOSIS — I4891 Unspecified atrial fibrillation: Secondary | ICD-10-CM | POA: Diagnosis not present

## 2012-07-03 ENCOUNTER — Encounter: Payer: Self-pay | Admitting: Cardiology

## 2012-07-03 ENCOUNTER — Ambulatory Visit (INDEPENDENT_AMBULATORY_CARE_PROVIDER_SITE_OTHER): Payer: Medicare Other | Admitting: Cardiology

## 2012-07-03 VITALS — BP 135/62 | HR 52 | Ht 62.0 in | Wt 176.0 lb

## 2012-07-03 DIAGNOSIS — R7301 Impaired fasting glucose: Secondary | ICD-10-CM | POA: Diagnosis not present

## 2012-07-03 DIAGNOSIS — I872 Venous insufficiency (chronic) (peripheral): Secondary | ICD-10-CM

## 2012-07-03 DIAGNOSIS — Z7901 Long term (current) use of anticoagulants: Secondary | ICD-10-CM

## 2012-07-03 DIAGNOSIS — I679 Cerebrovascular disease, unspecified: Secondary | ICD-10-CM

## 2012-07-03 DIAGNOSIS — I7090 Unspecified atherosclerosis: Secondary | ICD-10-CM | POA: Diagnosis not present

## 2012-07-03 DIAGNOSIS — I10 Essential (primary) hypertension: Secondary | ICD-10-CM | POA: Diagnosis not present

## 2012-07-03 DIAGNOSIS — I2510 Atherosclerotic heart disease of native coronary artery without angina pectoris: Secondary | ICD-10-CM

## 2012-07-03 DIAGNOSIS — E782 Mixed hyperlipidemia: Secondary | ICD-10-CM | POA: Diagnosis not present

## 2012-07-03 DIAGNOSIS — I4891 Unspecified atrial fibrillation: Secondary | ICD-10-CM

## 2012-07-03 MED ORDER — LOSARTAN POTASSIUM 100 MG PO TABS: 100.0000 mg | ORAL_TABLET | Freq: Every day | ORAL | Status: DC

## 2012-07-03 NOTE — Assessment & Plan Note (Signed)
Well controlled with low-salt diet and compression stockings.

## 2012-07-03 NOTE — Assessment & Plan Note (Signed)
No neurologic symptoms; no focal obstruction on most recent carotid ultrasound in 10/2011; a repeat study in 1-2 years would be reasonable.

## 2012-07-03 NOTE — Assessment & Plan Note (Signed)
No symptoms at present to suggest myocardial ischemia or progression of coronary disease. Our strategy will continue to be optimal control of cardiovascular risk factors.

## 2012-07-03 NOTE — Assessment & Plan Note (Signed)
Control of heart rate is good. Low dose of digoxin may not be contributing much. That medication will be held and heart rate control reassessed by the cardiology nurses in 2 weeks.

## 2012-07-03 NOTE — Progress Notes (Deleted)
Name: [name]    DOB: 10/30/1932  Age: 79 y.o.  MR#: [id number]       PCP:  Colette Ribas, MD      Insurance: Payor: MEDICARE  Plan: MEDICARE PART A AND B  Product Type: *No Product type*    CC:   STOOLS NEGATIVE 01-11-12 LIST, PT NOT SURE OF HYTRIN PER NOTED TITRATED ORDER, CONFIRMED PT IS NO LONGER ON 1MG  HYTRIN ONLY ON 5MG  HYTRIN ONE TABLET AT NIGHT PT ADVISED DIOVAN IS NOW TOO EXPENSIVE FOR HER INSURANCE AND WANTS TO DISCUSS A LOWER PRICE MEDICATION   VS Filed Vitals:   07/03/12 1116  BP: 135/62  Pulse: 52  Height: 5\' 2"  (1.575 m)  Weight: 176 lb (79.833 kg)    Weights Current Weight  07/03/12 176 lb (79.833 kg)  02/04/12 176 lb (79.833 kg)  01/03/12 178 lb 1.9 oz (80.795 kg)    Blood Pressure  BP Readings from Last 3 Encounters:  07/03/12 135/62  04/17/12 134/61  02/04/12 92/50     Admit date:  (Not on file) Last encounter with [facility]:  04/18/2012   Allergy Diltiazem and Verapamil  Current Outpatient Prescriptions  Medication Sig Dispense Refill  . atorvastatin (LIPITOR) 80 MG tablet Take 80 mg by mouth at bedtime.      . bisoprolol (ZEBETA) 10 MG tablet Take 10 mg by mouth 2 (two) times daily.        . digoxin (LANOXIN) 0.125 MG tablet Take 1 tablet (0.125 mg total) by mouth daily.  30 tablet  11  . furosemide (LASIX) 80 MG tablet Take 80 mg by mouth 2 (two) times daily.        . potassium chloride SA (K-DUR,KLOR-CON) 20 MEQ tablet Take 1 tablet (20 mEq total) by mouth daily.  30 tablet  11  . terazosin (HYTRIN) 5 MG capsule Take 1 capsule (5 mg total) by mouth at bedtime.  30 capsule  11  . valsartan (DIOVAN) 160 MG tablet Take 160 mg by mouth daily.        Marland Kitchen warfarin (COUMADIN) 5 MG tablet Take 1 tablet daily except 1 1/2 tablets on Mondays  45 tablet  3   No current facility-administered medications for this visit.    Discontinued Meds:    Medications Discontinued During This Encounter  Medication Reason  . terazosin (HYTRIN) 1 MG capsule Error     Patient Active Problem List  Diagnosis  . Atrial fibrillation  . ATHEROSCLEROTIC CARDIOVASCULAR DISEASE  . PERIPHERAL VASCULAR DISEASE  . Chronic venous insufficiency  . Chronic anticoagulation  . Tobacco abuse, in remission  . Cerebrovascular disease  . Hypertension  . Obesity  . Hyperlipidemia    LABS    Component Value Date/Time   NA 143 03/03/2012 1212   NA 140 01/07/2012 0820   NA 141 08/30/2011 1141   K 4.2 03/03/2012 1212   K 5.1 01/07/2012 0820   K 4.7 08/30/2011 1141   CL 100 03/03/2012 1212   CL 103 01/07/2012 0820   CL 101 08/30/2011 1141   CO2 31 03/03/2012 1212   CO2 29 01/07/2012 0820   CO2 28 08/30/2011 1141   GLUCOSE 107* 03/03/2012 1212   GLUCOSE 125* 01/07/2012 0820   GLUCOSE 98 08/30/2011 1141   BUN 15 03/03/2012 1212   BUN 17 01/07/2012 0820   BUN 17 08/30/2011 1141   CREATININE 0.88 03/03/2012 1212   CREATININE 0.85 01/07/2012 0820   CREATININE 0.85 08/30/2011 1141   CREATININE  0.90 04/05/2010 1049   CREATININE 0.90 01/26/2010   CREATININE 0.74 09/03/2009 0402   CALCIUM 10.0 03/03/2012 1212   CALCIUM 9.7 01/07/2012 0820   CALCIUM 9.8 08/30/2011 1141   GFRNONAA >60 04/05/2010 1049   GFRNONAA >60 01/26/2010   GFRNONAA >60 09/03/2009 0402   GFRAA  Value: >60        The eGFR has been calculated using the MDRD equation. This calculation has not been validated in all clinical situations. eGFR's persistently <60 mL/min signify possible Chronic Kidney Disease. 09/03/2009 0402   GFRAA  Value: >60        The eGFR has been calculated using the MDRD equation. This calculation has not been validated in all clinical situations. eGFR's persistently <60 mL/min signify possible Chronic Kidney Disease. 09/02/2009 0437   GFRAA  Value: >60        The eGFR has been calculated using the MDRD equation. This calculation has not been validated in all clinical situations. eGFR's persistently <60 mL/min signify possible Chronic Kidney Disease. 09/01/2009 0402   CMP     Component Value  Date/Time   NA 143 03/03/2012 1212   K 4.2 03/03/2012 1212   CL 100 03/03/2012 1212   CO2 31 03/03/2012 1212   GLUCOSE 107* 03/03/2012 1212   BUN 15 03/03/2012 1212   CREATININE 0.88 03/03/2012 1212   CREATININE 0.90 04/05/2010 1049   CALCIUM 10.0 03/03/2012 1212   PROT 7.2 01/07/2012 0820   ALBUMIN 4.0 01/07/2012 0820   AST 23 01/07/2012 0820   ALT 15 01/07/2012 0820   ALKPHOS 87 01/07/2012 0820   BILITOT 0.9 01/07/2012 0820   GFRNONAA >60 04/05/2010 1049   GFRAA  Value: >60        The eGFR has been calculated using the MDRD equation. This calculation has not been validated in all clinical situations. eGFR's persistently <60 mL/min signify possible Chronic Kidney Disease. 09/03/2009 0402       Component Value Date/Time   WBC 8.8 01/07/2012 0820   WBC 7.9 08/30/2011 1141   WBC 8.1 09/28/2010 0915   HGB 14.1 01/07/2012 0820   HGB 14.0 08/30/2011 1141   HGB 14.8 09/28/2010 0915   HCT 43.2 01/07/2012 0820   HCT 41.5 08/30/2011 1141   HCT 47.9* 09/28/2010 0915   MCV 83.1 01/07/2012 0820   MCV 82.7 08/30/2011 1141   MCV 88.2 09/28/2010 0915    Lipid Panel     Component Value Date/Time   CHOL 103 09/28/2010 0915   TRIG 92 09/28/2010 0915   HDL 35* 09/28/2010 0915   CHOLHDL 2.9 09/28/2010 0915   VLDL 18 09/28/2010 0915   LDLCALC 50 09/28/2010 0915    ABG    Component Value Date/Time   PHART 7.449* 09/02/2009 0915   PCO2ART 39.0 09/02/2009 0915   PO2ART 51.1* 09/02/2009 0915   HCO3 26.6* 09/02/2009 0915   TCO2 23.5 09/02/2009 0915   O2SAT 88.8 09/02/2009 0915     Lab Results  Component Value Date   TSH 1.455 ***Test methodology is 3rd generation TSH**** 08/31/2009   BNP (last 3 results) No results found for this basename: PROBNP,  in the last 8760 hours Cardiac Panel (last 3 results) No results found for this basename: CKTOTAL, CKMB, TROPONINI, RELINDX,  in the last 72 hours  Iron/TIBC/Ferritin No results found for this basename: iron, tibc, ferritin     EKG Orders placed in visit on  02/18/11  . RHYTHM STRIP     Prior Assessment and Plan Problem  List as of 07/03/2012     ICD-9-CM   Atrial fibrillation   Last Assessment & Plan   08/30/2011 Office Visit Written 08/30/2011 11:35 AM by Kathlen Brunswick, MD     Heart rate is well controlled; however, treatment with diltiazem could be contributing to peripheral edema.  Verapamil will be substituted at the same dose of 240 mg per day.  Heart rate control will be reassessed in one month.    ATHEROSCLEROTIC CARDIOVASCULAR DISEASE   Last Assessment & Plan   08/30/2011 Office Visit Written 08/30/2011 11:34 AM by Kathlen Brunswick, MD     Currently no symptoms to suggest progression of coronary artery disease.  We will continue to optimally manage her cardiovascular risk factors.    PERIPHERAL VASCULAR DISEASE   Last Assessment & Plan   07/26/2010 Office Visit Written 07/26/2010 12:19 PM by Dyann Kief, PA     Patient has had previous bilateral carotid endarterectomies. She does have a right carotid bruit. She has follow-up with Dr. Durwin Nora in June.    Chronic venous insufficiency   Last Assessment & Plan   08/30/2011 Office Visit Edited 09/03/2011  7:01 AM by Kathlen Brunswick, MD     Patient reports fairly good compliance with low salt diet and leg elevation.  She was warned by her vascular surgeon not to wear knee-high compression stockings. He will reevaluate venous disease at her next office appointment in approximately 2 months and determine whether intervention in the lower extremities would be desirable and appropriate.    Chronic anticoagulation   Last Assessment & Plan   01/03/2012 Office Visit Written 01/03/2012  3:59 PM by Kathlen Brunswick, MD     Patient has done well with warfarin therapy without evidence for occult GI blood loss.  She is not interested in any screening colonoscopy.  We will continue to monitor with serial CBCs and stool Hemoccult testing.    Tobacco abuse, in remission   Cerebrovascular disease    Last Assessment & Plan   08/30/2011 Office Visit Written 08/30/2011 11:36 AM by Kathlen Brunswick, MD     Cerebrovascular disease is followed by Dr. Edilia Bo.  A message was sent to him requesting that he evaluate venous insufficiency of the lower extremities when she returns to see him in July to determine if any intervention is required.  In the absence of severe symptoms or skin breakdown, I am not inclined to further increase her dose of diuretics.    Hypertension   Last Assessment & Plan   01/03/2012 Office Visit Edited 01/15/2012  7:55 PM by Kathlen Brunswick, MD     Blood pressure control is suboptimal, both in the office and by patient report at home.  Hytrin will be added to her medical regime and titrated with a return visit to the cardiology nurses anticipated in one month.  Home blood pressure measurements will be obtained by the patient in the interim and reviewed at that time.    Obesity   Hyperlipidemia   Last Assessment & Plan   01/03/2012 Office Visit Written 01/03/2012  4:05 PM by Kathlen Brunswick, MD     Extremely low total and LDL cholesterol.  Current therapy is adequate, and will be continued.        Imaging: No results found.

## 2012-07-03 NOTE — Progress Notes (Signed)
Patient ID: LAURENCIA ROMA, female   DOB: 12-07-1932, 79 y.o.   MRN: [id number]  HPI: Schedule return visit for this delightful woman with atrial fibrillation and vascular disease. She remains active and asymptomatic. She has experienced no medical problems over the past 6 months.  Her prescription for Diovan has become expensive, and she requests an alternative agent. INR has been stable and therapeutic. CBC and 9/13 was normal as was a Bmet in 11/13 except for glucose, which has typically been in the range of 98-125.  Current Outpatient Prescriptions  Medication Sig Dispense Refill  . atorvastatin (LIPITOR) 80 MG tablet Take 80 mg by mouth at bedtime.      . bisoprolol (ZEBETA) 10 MG tablet Take 10 mg by mouth 2 (two) times daily.        . digoxin (LANOXIN) 0.125 MG tablet Take 1 tablet (0.125 mg total) by mouth daily.  30 tablet  11  . furosemide (LASIX) 80 MG tablet Take 80 mg by mouth 2 (two) times daily.        . potassium chloride SA (K-DUR,KLOR-CON) 20 MEQ tablet Take 1 tablet (20 mEq total) by mouth daily.  30 tablet  11  . terazosin (HYTRIN) 5 MG capsule Take 1 capsule (5 mg total) by mouth at bedtime.  30 capsule  11  . warfarin (COUMADIN) 5 MG tablet Take 1 tablet daily except 1 1/2 tablets on Mondays  45 tablet  3   No current facility-administered medications for this visit.   Allergies  Allergen Reactions  . Diltiazem Other (See Comments)    Edema  . Verapamil Other (See Comments)    Diarrhea     Past medical history, social history, and family history reviewed and updated.  ROS: Denies chest pain, dyspnea, orthopnea, PND, palpitations, lightheadedness or syncope. All other systems reviewed and are negative.  PHYSICAL EXAM: BP 135/62  Pulse 52  Ht 5\' 2"  (1.575 m)  Wt 79.833 kg (176 lb)  BMI 32.18 kg/m2;  Body mass index is 32.18 kg/(m^2). General-Well developed; no acute distress Body habitus-mildly to moderately overweight Neck-No JVD; no carotid  bruits Lungs-clear lung fields; resonant to percussion Cardiovascular-normal PMI; normal S1 and S2; irregular rhythm Abdomen-normal bowel sounds; soft and non-tender without masses or organomegaly Musculoskeletal-No deformities, no cyanosis or clubbing Neurologic-Normal cranial nerves; symmetric strength and tone Skin-Warm, no significant lesions Extremities-compression stockings in place; no edema  Melville Bing, MD 07/03/2012  11:57 AM  ASSESSMENT AND PLAN

## 2012-07-03 NOTE — Assessment & Plan Note (Signed)
Doing very well with anticoagulation. Monitoring of CBC and stool Hemoccults will be maintained.

## 2012-07-03 NOTE — Patient Instructions (Addendum)
Your physician recommends that you schedule a follow-up appointment in:  1 - 2 weeks for rhythm strip 2 - 1 year with Dr Diona Browner  Your physician recommends that you return for lab work in: 3 months  Your physician has recommended you make the following change in your medication:  1 - STOP Digoxin 2 - STOP Diovan 3 - START Losartan 100 mg daily

## 2012-07-09 ENCOUNTER — Ambulatory Visit (INDEPENDENT_AMBULATORY_CARE_PROVIDER_SITE_OTHER): Payer: Medicare Other | Admitting: *Deleted

## 2012-07-09 ENCOUNTER — Telehealth: Payer: Self-pay | Admitting: *Deleted

## 2012-07-09 DIAGNOSIS — Z7901 Long term (current) use of anticoagulants: Secondary | ICD-10-CM

## 2012-07-09 DIAGNOSIS — I4891 Unspecified atrial fibrillation: Secondary | ICD-10-CM

## 2012-07-09 LAB — POCT INR: INR: 2.2

## 2012-07-10 ENCOUNTER — Encounter: Payer: Self-pay | Admitting: *Deleted

## 2012-07-21 DIAGNOSIS — J019 Acute sinusitis, unspecified: Secondary | ICD-10-CM | POA: Diagnosis not present

## 2012-07-21 DIAGNOSIS — Z6832 Body mass index (BMI) 32.0-32.9, adult: Secondary | ICD-10-CM | POA: Diagnosis not present

## 2012-07-21 DIAGNOSIS — R51 Headache: Secondary | ICD-10-CM | POA: Diagnosis not present

## 2012-07-23 ENCOUNTER — Ambulatory Visit (INDEPENDENT_AMBULATORY_CARE_PROVIDER_SITE_OTHER): Payer: Medicare Other | Admitting: *Deleted

## 2012-07-23 VITALS — BP 138/62 | HR 61 | Ht 62.0 in | Wt 169.0 lb

## 2012-07-23 DIAGNOSIS — I4891 Unspecified atrial fibrillation: Secondary | ICD-10-CM

## 2012-07-23 NOTE — Progress Notes (Signed)
Presents for BP check and rhythm strip, following 07/03/2012 appointment where digoxin and diovan were stopped and losartan was initiated at 100 mg daily.  At that visit BP was 135/62 and HR 52. Denies complaints and states taking medication as prescribed.

## 2012-07-31 NOTE — Progress Notes (Signed)
Patient ID: [name], female   DOB: 12-Jul-1932, 79 y.o.   MRN: [id number]  Doing well following modification of medical regime. Continue current Rx

## 2012-08-13 ENCOUNTER — Ambulatory Visit (INDEPENDENT_AMBULATORY_CARE_PROVIDER_SITE_OTHER): Payer: Medicare Other | Admitting: *Deleted

## 2012-08-13 DIAGNOSIS — I4891 Unspecified atrial fibrillation: Secondary | ICD-10-CM | POA: Diagnosis not present

## 2012-08-13 DIAGNOSIS — Z7901 Long term (current) use of anticoagulants: Secondary | ICD-10-CM

## 2012-08-13 LAB — POCT INR: INR: 2.9

## 2012-09-24 ENCOUNTER — Ambulatory Visit (INDEPENDENT_AMBULATORY_CARE_PROVIDER_SITE_OTHER): Payer: Medicare Other | Admitting: *Deleted

## 2012-09-24 DIAGNOSIS — Z7901 Long term (current) use of anticoagulants: Secondary | ICD-10-CM | POA: Diagnosis not present

## 2012-09-24 DIAGNOSIS — I4891 Unspecified atrial fibrillation: Secondary | ICD-10-CM | POA: Diagnosis not present

## 2012-09-24 LAB — POCT INR: INR: 2.1

## 2012-10-02 ENCOUNTER — Other Ambulatory Visit: Payer: Self-pay | Admitting: *Deleted

## 2012-10-02 DIAGNOSIS — I10 Essential (primary) hypertension: Secondary | ICD-10-CM

## 2012-10-02 DIAGNOSIS — R7301 Impaired fasting glucose: Secondary | ICD-10-CM | POA: Diagnosis not present

## 2012-10-02 DIAGNOSIS — Z7901 Long term (current) use of anticoagulants: Secondary | ICD-10-CM

## 2012-10-02 DIAGNOSIS — E782 Mixed hyperlipidemia: Secondary | ICD-10-CM | POA: Diagnosis not present

## 2012-10-02 LAB — COMPREHENSIVE METABOLIC PANEL
Alkaline Phosphatase: 81 U/L (ref 39–117)
BUN: 19 mg/dL (ref 6–23)
CO2: 29 mEq/L (ref 19–32)
Creat: 0.91 mg/dL (ref 0.50–1.10)
Glucose, Bld: 107 mg/dL — ABNORMAL HIGH (ref 70–99)
Sodium: 141 mEq/L (ref 135–145)
Total Bilirubin: 0.8 mg/dL (ref 0.3–1.2)

## 2012-10-02 LAB — CBC
Hemoglobin: 12.8 g/dL (ref 12.0–15.0)
MCH: 27.8 pg (ref 26.0–34.0)
MCV: 81.6 fL (ref 78.0–100.0)
RBC: 4.61 MIL/uL (ref 3.87–5.11)
WBC: 7.1 10*3/uL (ref 4.0–10.5)

## 2012-10-02 LAB — LIPID PANEL
HDL: 37 mg/dL — ABNORMAL LOW (ref >39)
Total CHOL/HDL Ratio: 2.6 Ratio
VLDL: 14 mg/dL (ref 0–40)

## 2012-10-03 ENCOUNTER — Encounter: Payer: Self-pay | Admitting: Cardiology

## 2012-10-03 ENCOUNTER — Encounter: Payer: Self-pay | Admitting: *Deleted

## 2012-10-16 ENCOUNTER — Other Ambulatory Visit (INDEPENDENT_AMBULATORY_CARE_PROVIDER_SITE_OTHER): Payer: Medicare Other | Admitting: Vascular Surgery

## 2012-10-16 DIAGNOSIS — Z48812 Encounter for surgical aftercare following surgery on the circulatory system: Secondary | ICD-10-CM

## 2012-10-16 DIAGNOSIS — I6529 Occlusion and stenosis of unspecified carotid artery: Secondary | ICD-10-CM

## 2012-10-22 ENCOUNTER — Ambulatory Visit (INDEPENDENT_AMBULATORY_CARE_PROVIDER_SITE_OTHER): Payer: Medicare Other | Admitting: *Deleted

## 2012-10-22 DIAGNOSIS — I4891 Unspecified atrial fibrillation: Secondary | ICD-10-CM

## 2012-10-22 DIAGNOSIS — Z7901 Long term (current) use of anticoagulants: Secondary | ICD-10-CM | POA: Diagnosis not present

## 2012-10-22 LAB — POCT INR: INR: 2.1

## 2012-11-04 ENCOUNTER — Other Ambulatory Visit: Payer: Medicare Other

## 2012-11-04 ENCOUNTER — Ambulatory Visit: Payer: Medicare Other | Admitting: Neurosurgery

## 2012-11-06 ENCOUNTER — Other Ambulatory Visit: Payer: Self-pay | Admitting: *Deleted

## 2012-11-12 DIAGNOSIS — Z6833 Body mass index (BMI) 33.0-33.9, adult: Secondary | ICD-10-CM | POA: Diagnosis not present

## 2012-11-12 DIAGNOSIS — B370 Candidal stomatitis: Secondary | ICD-10-CM | POA: Diagnosis not present

## 2012-11-13 ENCOUNTER — Encounter: Payer: Self-pay | Admitting: Surgery

## 2012-11-27 ENCOUNTER — Other Ambulatory Visit: Payer: Self-pay | Admitting: Cardiology

## 2012-12-03 ENCOUNTER — Ambulatory Visit (INDEPENDENT_AMBULATORY_CARE_PROVIDER_SITE_OTHER): Payer: Medicare Other | Admitting: *Deleted

## 2012-12-03 DIAGNOSIS — Z7901 Long term (current) use of anticoagulants: Secondary | ICD-10-CM

## 2012-12-03 DIAGNOSIS — I4891 Unspecified atrial fibrillation: Secondary | ICD-10-CM | POA: Diagnosis not present

## 2012-12-07 ENCOUNTER — Other Ambulatory Visit: Payer: Self-pay | Admitting: Cardiology

## 2012-12-10 ENCOUNTER — Other Ambulatory Visit: Payer: Self-pay | Admitting: Cardiology

## 2013-01-14 ENCOUNTER — Ambulatory Visit (INDEPENDENT_AMBULATORY_CARE_PROVIDER_SITE_OTHER): Payer: Medicare Other | Admitting: *Deleted

## 2013-01-14 DIAGNOSIS — I4891 Unspecified atrial fibrillation: Secondary | ICD-10-CM

## 2013-01-14 DIAGNOSIS — Z7901 Long term (current) use of anticoagulants: Secondary | ICD-10-CM | POA: Diagnosis not present

## 2013-01-25 ENCOUNTER — Other Ambulatory Visit: Payer: Self-pay | Admitting: Cardiology

## 2013-02-25 ENCOUNTER — Ambulatory Visit (INDEPENDENT_AMBULATORY_CARE_PROVIDER_SITE_OTHER): Payer: Medicare Other | Admitting: *Deleted

## 2013-02-25 DIAGNOSIS — I4891 Unspecified atrial fibrillation: Secondary | ICD-10-CM

## 2013-02-25 DIAGNOSIS — Z7901 Long term (current) use of anticoagulants: Secondary | ICD-10-CM | POA: Diagnosis not present

## 2013-02-25 LAB — POCT INR: INR: 2

## 2013-04-01 ENCOUNTER — Ambulatory Visit (INDEPENDENT_AMBULATORY_CARE_PROVIDER_SITE_OTHER): Payer: Medicare Other | Admitting: *Deleted

## 2013-04-01 DIAGNOSIS — Z7901 Long term (current) use of anticoagulants: Secondary | ICD-10-CM

## 2013-04-01 DIAGNOSIS — I4891 Unspecified atrial fibrillation: Secondary | ICD-10-CM | POA: Diagnosis not present

## 2013-04-01 LAB — POCT INR: INR: 2.3

## 2013-05-06 ENCOUNTER — Ambulatory Visit (INDEPENDENT_AMBULATORY_CARE_PROVIDER_SITE_OTHER): Payer: Medicare Other | Admitting: *Deleted

## 2013-05-06 DIAGNOSIS — Z7901 Long term (current) use of anticoagulants: Secondary | ICD-10-CM

## 2013-05-06 DIAGNOSIS — I4891 Unspecified atrial fibrillation: Secondary | ICD-10-CM | POA: Diagnosis not present

## 2013-05-06 LAB — POCT INR: INR: 4.4

## 2013-05-13 ENCOUNTER — Other Ambulatory Visit: Payer: Self-pay | Admitting: Cardiology

## 2013-05-13 DIAGNOSIS — Z6832 Body mass index (BMI) 32.0-32.9, adult: Secondary | ICD-10-CM | POA: Diagnosis not present

## 2013-05-13 DIAGNOSIS — K130 Diseases of lips: Secondary | ICD-10-CM | POA: Diagnosis not present

## 2013-05-13 NOTE — Telephone Encounter (Signed)
Received fax refill request  Rx # [id number] Medication:  Warfarin Sodium 5 mg tablet Qty 45 Sig:  Take 1 tablet by mouth once daily except 1 and 1/2 tablet on Monday Physician:  [name] (note has not established yet) was a patient of [facility].

## 2013-05-18 ENCOUNTER — Ambulatory Visit (INDEPENDENT_AMBULATORY_CARE_PROVIDER_SITE_OTHER): Payer: Medicare Other | Admitting: *Deleted

## 2013-05-18 ENCOUNTER — Telehealth: Payer: Self-pay

## 2013-05-18 DIAGNOSIS — Z5181 Encounter for therapeutic drug level monitoring: Secondary | ICD-10-CM | POA: Diagnosis not present

## 2013-05-18 DIAGNOSIS — I4891 Unspecified atrial fibrillation: Secondary | ICD-10-CM | POA: Diagnosis not present

## 2013-05-18 DIAGNOSIS — Z7901 Long term (current) use of anticoagulants: Secondary | ICD-10-CM

## 2013-05-18 LAB — POCT INR: INR: 3.2

## 2013-05-18 MED ORDER — WARFARIN SODIUM 5 MG PO TABS: ORAL_TABLET | ORAL | Status: DC

## 2013-05-18 NOTE — Telephone Encounter (Signed)
Received fax refill request  Rx # [id number] Medication:  Warfarin sodium 5 mg tablet Qty 45 Sig:  Take 1 tablet by mouth once daily except 1 and 1/2 tablet on Monday Physician:  [name]

## 2013-05-18 NOTE — Telephone Encounter (Signed)
done

## 2013-06-01 ENCOUNTER — Other Ambulatory Visit: Payer: Self-pay | Admitting: Vascular Surgery

## 2013-06-01 DIAGNOSIS — I6529 Occlusion and stenosis of unspecified carotid artery: Secondary | ICD-10-CM

## 2013-06-10 ENCOUNTER — Ambulatory Visit (INDEPENDENT_AMBULATORY_CARE_PROVIDER_SITE_OTHER): Payer: Medicare Other | Admitting: *Deleted

## 2013-06-10 DIAGNOSIS — Z5181 Encounter for therapeutic drug level monitoring: Secondary | ICD-10-CM | POA: Diagnosis not present

## 2013-06-10 DIAGNOSIS — I4891 Unspecified atrial fibrillation: Secondary | ICD-10-CM

## 2013-06-10 DIAGNOSIS — Z7901 Long term (current) use of anticoagulants: Secondary | ICD-10-CM

## 2013-06-10 LAB — POCT INR: INR: 2.6

## 2013-06-16 ENCOUNTER — Other Ambulatory Visit: Payer: Self-pay | Admitting: Cardiology

## 2013-07-06 ENCOUNTER — Encounter: Payer: Self-pay | Admitting: Cardiology

## 2013-07-06 ENCOUNTER — Ambulatory Visit (INDEPENDENT_AMBULATORY_CARE_PROVIDER_SITE_OTHER): Payer: Medicare Other | Admitting: Cardiology

## 2013-07-06 ENCOUNTER — Ambulatory Visit (INDEPENDENT_AMBULATORY_CARE_PROVIDER_SITE_OTHER): Payer: Medicare Other | Admitting: *Deleted

## 2013-07-06 VITALS — BP 123/72 | HR 68 | Ht 62.0 in | Wt 175.0 lb

## 2013-07-06 DIAGNOSIS — Z5181 Encounter for therapeutic drug level monitoring: Secondary | ICD-10-CM

## 2013-07-06 DIAGNOSIS — I4891 Unspecified atrial fibrillation: Secondary | ICD-10-CM

## 2013-07-06 DIAGNOSIS — I6529 Occlusion and stenosis of unspecified carotid artery: Secondary | ICD-10-CM | POA: Diagnosis not present

## 2013-07-06 DIAGNOSIS — E785 Hyperlipidemia, unspecified: Secondary | ICD-10-CM | POA: Diagnosis not present

## 2013-07-06 DIAGNOSIS — I2510 Atherosclerotic heart disease of native coronary artery without angina pectoris: Secondary | ICD-10-CM

## 2013-07-06 LAB — POCT INR: INR: 1.8

## 2013-07-06 NOTE — Progress Notes (Signed)
Clinical Summary [name] is a 80 y.o.female former patient [name], last seen in March 2014. Her husband was a former patient of mine prior to his passing. She is here with her daughter today. Has been relatively stable, although still going through the grieving process. She lives alone, has support from her family. She does not report any palpitations, no angina symptoms.  She is followed in the Coumadin clinic, on Coumadin for atrial fibrillation. Lab work from June 2014 she cholesterol 98, triglycerides 69, HDL 37, LDL 47, potassium 4.1, BUN 19, creatinine 0.9, normal LFTs, hemoglobin 12.8, platelets 172.  Carotid Dopplers from July 2014 demonstrated patent right CEA site as well as patent left CEA site.  ECG today shows rate-controlled atrial fibrillation.   Allergies  Allergen Reactions  . Diltiazem Other (See Comments)    Edema  . Verapamil Other (See Comments)    Diarrhea    Current Outpatient Prescriptions  Medication Sig Dispense Refill  . atorvastatin (LIPITOR) 80 MG tablet TAKE 1 TABLET BY MOUTH AT BEDTIME  60 tablet  6  . bisoprolol (ZEBETA) 10 MG tablet Take 10 mg by mouth 2 (two) times daily.        . furosemide (LASIX) 80 MG tablet Take 80 mg by mouth 2 (two) times daily.        Marland Kitchen losartan (COZAAR) 100 MG tablet take 1 tablet by mouth once daily  90 tablet  3  . potassium chloride SA (K-DUR,KLOR-CON) 20 MEQ tablet take 1 tablet by mouth once daily  30 tablet  6  . terazosin (HYTRIN) 5 MG capsule take 1 capsule by mouth at bedtime  30 capsule  8  . warfarin (COUMADIN) 5 MG tablet Take 1 tablet daily  40 tablet  3   No current facility-administered medications for this visit.    Past Medical History  Diagnosis Date  . Coronary atherosclerosis of native coronary artery     RCA stent in 2003; residual 60% mid Cx and OM2  . Carotid artery disease     RIght CEA 05/2004  . Essential hypertension, benign   . Hyperlipidemia   . Renal artery stenosis    Bilateral renal artery stents 2004  . Tobacco abuse, in remission     40 pack year stopped in 1989  . Atrial fibrillation   . Venous insufficiency (chronic) (peripheral)     Pedal edema  . Chronic anticoagulation   . Obesity     Past Surgical History  Procedure Laterality Date  . Carotid endarterectomy      Right in 2005, left in 2010  . Colonoscopy  Never    Declines    Family History  Problem Relation Age of Onset  . Lung cancer Mother     Social History Ms. Kackley reports that she quit smoking about 26 years ago. Her smoking use included Cigarettes. She smoked 0.00 packs per day. She has never used smokeless tobacco. Ms. Landino reports that she does not drink alcohol.  Review of Systems No recent falls or syncope. Stable appetite, although eating less and has lost some weight. No obvious bleeding episodes. Otherwise negative.  Physical Examination Filed Vitals:   07/06/13 1133  BP: 123/72  Pulse: 68   Filed Weights   07/06/13 1133  Weight: 175 lb (79.379 kg)   Patient is in no distress. HEENT: Conjunctiva and lids normal, oropharynx clear. Neck: Supple, no elevated JVP or carotid bruits, no thyromegaly. Lungs: Clear to auscultation, nonlabored breathing at  rest. Cardiac: Irregularly irregular, no S3 or significant systolic murmur, no pericardial rub. Abdomen: Soft, nontender,bowel sounds present. Extremities: No pitting edema, distal pulses 2+. Skin: Warm and dry. Musculoskeletal: No kyphosis. Neuropsychiatric: Alert and oriented x3, affect grossly appropriate.   Problem List and Plan   Atrial fibrillation Symptomatically stable, continue strategy of heart rate control and anticoagulation.  Coronary atherosclerosis of native coronary artery No active angina symptoms. Has history of RCA stent in 2003, residual disease being managed medically.  Carotid artery occlusion Status post right and left CEA, carotid Dopplers in July 2014 demonstrated patent  CEA sites.  Hyperlipidemia Followup CMET and lipid panel.    Satira Sark, M.D., F.A.C.C.

## 2013-07-06 NOTE — Patient Instructions (Signed)
Your physician wants you to follow-up in: 6 months You will receive a reminder letter in the mail two months in advance. If you don't receive a letter, please call our office to schedule the follow-up appointment.   Your physician recommends that you continue on your current medications as directed. Please refer to the Current Medication list given to you today.   Please get blood work as soon as you can (CMET,CBC,fasting lipids) NO food or drink 12 hrs before tests   Thank you for choosing [facility] !

## 2013-07-06 NOTE — Assessment & Plan Note (Signed)
Symptomatically stable, continue strategy of heart rate control and anticoagulation.

## 2013-07-06 NOTE — Assessment & Plan Note (Signed)
No active angina symptoms. Has history of RCA stent in 2003, residual disease being managed medically.

## 2013-07-06 NOTE — Assessment & Plan Note (Signed)
Followup CMET and lipid panel.

## 2013-07-06 NOTE — Assessment & Plan Note (Signed)
Status post right and left CEA, carotid Dopplers in July 2014 demonstrated patent CEA sites.

## 2013-07-08 DIAGNOSIS — E785 Hyperlipidemia, unspecified: Secondary | ICD-10-CM | POA: Diagnosis not present

## 2013-07-08 LAB — COMPREHENSIVE METABOLIC PANEL
ALT: 15 U/L (ref 0–35)
AST: 20 U/L (ref 0–37)
Albumin: 4.2 g/dL (ref 3.5–5.2)
Alkaline Phosphatase: 83 U/L (ref 39–117)
BILIRUBIN TOTAL: 0.9 mg/dL (ref 0.2–1.2)
BUN: 20 mg/dL (ref 6–23)
CALCIUM: 9.7 mg/dL (ref 8.4–10.5)
CO2: 33 meq/L — AB (ref 19–32)
CREATININE: 0.9 mg/dL (ref 0.50–1.10)
Chloride: 101 mEq/L (ref 96–112)
Glucose, Bld: 110 mg/dL — ABNORMAL HIGH (ref 70–99)
Potassium: 4.1 mEq/L (ref 3.5–5.3)
Sodium: 144 mEq/L (ref 135–145)
Total Protein: 7.6 g/dL (ref 6.0–8.3)

## 2013-07-08 LAB — CBC
HEMATOCRIT: 40.2 % (ref 36.0–46.0)
Hemoglobin: 13.4 g/dL (ref 12.0–15.0)
MCH: 28.1 pg (ref 26.0–34.0)
MCHC: 33.3 g/dL (ref 30.0–36.0)
MCV: 84.3 fL (ref 78.0–100.0)
Platelets: 183 10*3/uL (ref 150–400)
RBC: 4.77 MIL/uL (ref 3.87–5.11)
RDW: 15.9 % — ABNORMAL HIGH (ref 11.5–15.5)
WBC: 8.3 10*3/uL (ref 4.0–10.5)

## 2013-07-08 LAB — LIPID PANEL
Cholesterol: 103 mg/dL (ref 0–200)
HDL: 39 mg/dL — ABNORMAL LOW (ref >39)
LDL CALC: 43 mg/dL (ref 0–99)
Total CHOL/HDL Ratio: 2.6 Ratio
Triglycerides: 106 mg/dL (ref <150)
VLDL: 21 mg/dL (ref 0–40)

## 2013-07-27 ENCOUNTER — Ambulatory Visit (INDEPENDENT_AMBULATORY_CARE_PROVIDER_SITE_OTHER): Payer: Medicare Other | Admitting: *Deleted

## 2013-07-27 DIAGNOSIS — Z5181 Encounter for therapeutic drug level monitoring: Secondary | ICD-10-CM | POA: Diagnosis not present

## 2013-07-27 DIAGNOSIS — I4891 Unspecified atrial fibrillation: Secondary | ICD-10-CM

## 2013-07-27 LAB — POCT INR: INR: 1.8

## 2013-08-10 ENCOUNTER — Telehealth: Payer: Self-pay | Admitting: Adult Health

## 2013-08-10 MED ORDER — POTASSIUM CHLORIDE CRYS ER 20 MEQ PO TBCR: EXTENDED_RELEASE_TABLET | ORAL | Status: DC

## 2013-08-10 NOTE — Telephone Encounter (Signed)
Medication sent via escribe.  

## 2013-08-10 NOTE — Telephone Encounter (Signed)
Received fax refill request  Rx # [id number] Medication:  Potassium CL ER 20 meq  Qty 30 Sig:  Take one tablet once daily Physician:  Purcell Nails

## 2013-08-19 ENCOUNTER — Ambulatory Visit (INDEPENDENT_AMBULATORY_CARE_PROVIDER_SITE_OTHER): Payer: Medicare Other | Admitting: *Deleted

## 2013-08-19 DIAGNOSIS — Z5181 Encounter for therapeutic drug level monitoring: Secondary | ICD-10-CM | POA: Diagnosis not present

## 2013-08-19 DIAGNOSIS — I4891 Unspecified atrial fibrillation: Secondary | ICD-10-CM

## 2013-08-19 LAB — POCT INR: INR: 2.9

## 2013-09-21 ENCOUNTER — Ambulatory Visit (INDEPENDENT_AMBULATORY_CARE_PROVIDER_SITE_OTHER): Payer: Medicare Other | Admitting: *Deleted

## 2013-09-21 DIAGNOSIS — Z5181 Encounter for therapeutic drug level monitoring: Secondary | ICD-10-CM | POA: Diagnosis not present

## 2013-09-21 DIAGNOSIS — I4891 Unspecified atrial fibrillation: Secondary | ICD-10-CM | POA: Diagnosis not present

## 2013-09-21 LAB — POCT INR: INR: 2.7

## 2013-10-01 ENCOUNTER — Other Ambulatory Visit: Payer: Self-pay | Admitting: Cardiology

## 2013-10-05 ENCOUNTER — Telehealth: Payer: Self-pay | Admitting: Cardiology

## 2013-10-05 MED ORDER — BISOPROLOL FUMARATE 10 MG PO TABS: 10.0000 mg | ORAL_TABLET | Freq: Two times a day (BID) | ORAL | Status: DC

## 2013-10-05 NOTE — Telephone Encounter (Signed)
Medication sent via escribe.  

## 2013-10-05 NOTE — Telephone Encounter (Signed)
Received fax refill request  Rx # [id number] Medication:  Bisoprolol Fumarate 10 mg tab Qty 60 Sig:  Take one tablet by mouth twice daily for 30 days Physician:  [name]

## 2013-10-08 DIAGNOSIS — M109 Gout, unspecified: Secondary | ICD-10-CM | POA: Diagnosis not present

## 2013-10-12 ENCOUNTER — Telehealth: Payer: Self-pay | Admitting: Cardiology

## 2013-10-12 DIAGNOSIS — I8390 Asymptomatic varicose veins of unspecified lower extremity: Secondary | ICD-10-CM | POA: Diagnosis not present

## 2013-10-12 DIAGNOSIS — Z6832 Body mass index (BMI) 32.0-32.9, adult: Secondary | ICD-10-CM | POA: Diagnosis not present

## 2013-10-12 DIAGNOSIS — M109 Gout, unspecified: Secondary | ICD-10-CM | POA: Diagnosis not present

## 2013-10-12 NOTE — Telephone Encounter (Signed)
Spoke with patient stating that we could not follow her DM. Advised her to contact her primary dr and asked for a referral to an endocrinologist. Pt understood.

## 2013-10-12 NOTE — Telephone Encounter (Signed)
Patient states that [place] told her to contact our office regarding her "sugar levels".  / tgs

## 2013-10-13 ENCOUNTER — Telehealth: Payer: Self-pay | Admitting: Vascular Surgery

## 2013-10-13 ENCOUNTER — Telehealth: Payer: Self-pay

## 2013-10-13 ENCOUNTER — Telehealth: Payer: Self-pay | Admitting: *Deleted

## 2013-10-13 NOTE — Telephone Encounter (Signed)
Phone call from pt.  Requested an appt. to have her varicose veins checked.  Reported she has bulging veins in both legs, but has a very tender swollen vein in the left lower leg toward the ankle.  Denies generalized swelling in her legs.  Pt. Stated she had compression hose, but hasn't been able to wear them due to the constriction around the ankle area.  Advised will schedule appt. for VV evaluation.  Recommended pt. apply ace wraps to lower legs, from top of foot, proximal to the toes, and wrap to just below the knee, and to elevate legs above level of heart at intervals during the day.  Verb. Understanding.

## 2013-10-13 NOTE — Telephone Encounter (Signed)
Message Received: 1 day ago     Rolla Flatten, [place] [birth year] [phone] needs a Bilat reflux study and a visit with Scot Dock if possible asap. She has bled from a vv and is very worried about this. Elna Breslow (Can you let me know when she'll be coming in? Thx!)        10/13/13: spoke with Ms Collymore to schedule an appointment for 11/04/13 at 9:30 for lab and 11:15 with CSD. dpm

## 2013-10-13 NOTE — Telephone Encounter (Signed)
Patient's daughter called in concerned about some VV's that have bled in the past (left ankle and right foot). After lengthy discussion re how to treat a bleeding varix, I told her we would schedule an ultrasound and a visit with Dr. Scot Dock who follows her for arterial issues. Daughter seemed reassured.

## 2013-10-21 ENCOUNTER — Other Ambulatory Visit (HOSPITAL_COMMUNITY): Payer: Medicare Other

## 2013-10-21 ENCOUNTER — Ambulatory Visit: Payer: Medicare Other | Admitting: Vascular Surgery

## 2013-10-21 ENCOUNTER — Ambulatory Visit (INDEPENDENT_AMBULATORY_CARE_PROVIDER_SITE_OTHER): Payer: Medicare Other | Admitting: *Deleted

## 2013-10-21 DIAGNOSIS — Z5181 Encounter for therapeutic drug level monitoring: Secondary | ICD-10-CM

## 2013-10-21 DIAGNOSIS — I4891 Unspecified atrial fibrillation: Secondary | ICD-10-CM | POA: Diagnosis not present

## 2013-10-21 LAB — POCT INR: INR: 4

## 2013-10-23 ENCOUNTER — Other Ambulatory Visit: Payer: Self-pay | Admitting: *Deleted

## 2013-10-23 DIAGNOSIS — I83893 Varicose veins of bilateral lower extremities with other complications: Secondary | ICD-10-CM

## 2013-11-03 ENCOUNTER — Encounter: Payer: Self-pay | Admitting: Vascular Surgery

## 2013-11-04 ENCOUNTER — Ambulatory Visit (HOSPITAL_COMMUNITY)
Admission: RE | Admit: 2013-11-04 | Discharge: 2013-11-04 | Disposition: A | Discharge status: Home / Self Care | Payer: Medicare Other | Source: Ambulatory Visit | Attending: Vascular Surgery | Admitting: Vascular Surgery

## 2013-11-04 ENCOUNTER — Ambulatory Visit (INDEPENDENT_AMBULATORY_CARE_PROVIDER_SITE_OTHER): Payer: Medicare Other | Admitting: Vascular Surgery

## 2013-11-04 ENCOUNTER — Encounter: Payer: Self-pay | Admitting: Vascular Surgery

## 2013-11-04 VITALS — BP 128/78 | HR 68 | Ht 62.0 in | Wt 172.0 lb

## 2013-11-04 DIAGNOSIS — I83893 Varicose veins of bilateral lower extremities with other complications: Secondary | ICD-10-CM | POA: Insufficient documentation

## 2013-11-04 NOTE — Assessment & Plan Note (Signed)
This patient has large truncal thoracostomy bilaterally but appear to be under significantly elevated pressure and are causing significant symptoms. She's also had some bleeding from her right ankle from a small spider vein. I discussed with her the importance of intermittent leg elevation. I have also written her a prescription for a thigh high compression stockings with a gradient of 20-30 mm of mercury. I'll have her follow up with Dr. Kellie Simmering or Dr. Donnetta Hutching in 3 months to consider laser ablation of the left greater saphenous vein if her symptoms have not improved using the thigh-high compression stockings. She is to call sooner if she has problems. Of note she is on Coumadin.

## 2013-11-04 NOTE — Progress Notes (Signed)
Patient ID: [name], female   DOB: [birth year], 80 y.o.   MRN: [id number]  Reason for Consult: varicose vein   Referred by Halford Chessman, MD  Subjective:     HPI:  [name] is a 79 y.o. female who has had varicose veins of both lower extremities for as long as she can remember. Approximately 5 weeks ago she had a bleeding episode from a small spider vein on her right ankle. She is most concerned however about some very large conchal varicosities in her left leg. She denies any previous history of DVT or phlebitis. She experiences significant aching pain and heaviness in her lower extremities which is worsened by standing and sitting and relieved with elevation. She does elevate her legs daily. She has not been wearing compression stockings.  Past Medical History  Diagnosis Date  . Coronary atherosclerosis of native coronary artery     RCA stent in 2003; residual 60% mid Cx and OM2  . Carotid artery disease     RIght CEA 05/2004  . Essential hypertension, benign   . Hyperlipidemia   . Renal artery stenosis     Bilateral renal artery stents 2004  . Tobacco abuse, in remission     40 pack year stopped in 1989  . Atrial fibrillation   . Venous insufficiency (chronic) (peripheral)     Pedal edema  . Chronic anticoagulation   . Obesity    Family History  Problem Relation Age of Onset  . Lung cancer Mother    Past Surgical History  Procedure Laterality Date  . Carotid endarterectomy      Right in 2005, left in 2010  . Colonoscopy  Never    Declines   Short Social History:  History  Substance Use Topics  . Smoking status: Former Smoker    Types: Cigarettes    Quit date: 04/30/1987  . Smokeless tobacco: Never Used  . Alcohol Use: No   Allergies  Allergen Reactions  . Diltiazem Other (See Comments)    Edema  . Verapamil Other (See Comments)    Diarrhea   Current Outpatient Prescriptions  Medication Sig Dispense Refill  . atorvastatin (LIPITOR)  80 MG tablet TAKE 1 TABLET BY MOUTH AT BEDTIME  60 tablet  6  . bisoprolol (ZEBETA) 10 MG tablet Take 1 tablet (10 mg total) by mouth 2 (two) times daily.  60 tablet  6  . furosemide (LASIX) 80 MG tablet Take 80 mg by mouth 2 (two) times daily.        Marland Kitchen losartan (COZAAR) 100 MG tablet take 1 tablet by mouth once daily  90 tablet  3  . potassium chloride SA (K-DUR,KLOR-CON) 20 MEQ tablet take 1 tablet by mouth once daily  30 tablet  6  . terazosin (HYTRIN) 5 MG capsule TAKE ONE CAPSULE BY MOUTH AT BEDTIME  90 capsule  3  . warfarin (COUMADIN) 5 MG tablet Take 1 tablet daily  40 tablet  3   No current facility-administered medications for this visit.    Review of Systems  Constitutional: Negative for chills and fever.  Eyes: Negative for loss of vision.  Respiratory: Negative for cough and wheezing.  Cardiovascular: Negative for chest pain, chest tightness, claudication, dyspnea with exertion, orthopnea and palpitations.  GI: Negative for blood in stool and vomiting.  GU: Negative for dysuria and hematuria.  Musculoskeletal: Negative for leg pain, joint pain and myalgias.  Skin: Negative for rash and wound.  Neurological: Negative for  dizziness and speech difficulty.  Hematologic: Negative for bruises/bleeds easily. Psychiatric: Negative for depressed mood.        Objective:  Objective  Filed Vitals:   11/04/13 1143  BP: 128/78  Pulse: 68  Height: 5\' 2"  (1.575 m)  Weight: 172 lb (78.019 kg)  SpO2: 100%   Body mass index is 31.45 kg/(m^2).  Physical Exam  Constitutional: She is oriented to person, place, and time.  HENT:  Head: Normocephalic and atraumatic.  Neck: Neck supple. No JVD present. No thyromegaly present.  Cardiovascular: Normal rate, regular rhythm, normal heart sounds and normal pulses.  Exam reveals no friction rub.   No murmur heard. Pulmonary/Chest: Breath sounds normal. She has no wheezes. She has no rales.  Abdominal: Soft. Bowel sounds are normal. There  is no tenderness.  Musculoskeletal: Normal range of motion. She exhibits no edema and no tenderness.  Lymphadenopathy:    She has no cervical adenopathy.  Neurological: She is alert and oriented to person, place, and time. She has normal strength. No sensory deficit.  Skin: No lesion and no rash noted.  She has large truncal varicosities along the medial aspect of both legs but more significantly on the left side. She has spider veins in both feet.   Data: I have independently interpreted her venous duplex scan of the left lower extremity today. She has no evidence of DVT. She does have significant vein reflux on the left. She also has reflux in her greater saphenous vein which is enlarged and tortuous. This reason to these large truncal varicosities in her left leg.      Assessment/Plan:    Varicose veins of lower extremities with other complications This patient has large truncal thoracostomy bilaterally but appear to be under significantly elevated pressure and are causing significant symptoms. She's also had some bleeding from her right ankle from a small spider vein. I discussed with her the importance of intermittent leg elevation. I have also written her a prescription for a thigh high compression stockings with a gradient of 20-30 mm of mercury. I'll have her follow up with Dr. Kellie Simmering or Dr. Donnetta Hutching in 3 months to consider laser ablation of the left greater saphenous vein if her symptoms have not improved using the thigh-high compression stockings. She is to call sooner if she has problems. Of note she is on Coumadin.   Angelia Mould MD [facility]

## 2013-11-05 ENCOUNTER — Ambulatory Visit (INDEPENDENT_AMBULATORY_CARE_PROVIDER_SITE_OTHER): Payer: Medicare Other | Admitting: *Deleted

## 2013-11-05 DIAGNOSIS — I4891 Unspecified atrial fibrillation: Secondary | ICD-10-CM | POA: Diagnosis not present

## 2013-11-05 DIAGNOSIS — Z5181 Encounter for therapeutic drug level monitoring: Secondary | ICD-10-CM | POA: Diagnosis not present

## 2013-11-05 LAB — POCT INR: INR: 3.1

## 2013-11-10 ENCOUNTER — Encounter: Payer: Self-pay | Admitting: Family

## 2013-11-11 ENCOUNTER — Ambulatory Visit: Payer: Medicare Other | Admitting: Vascular Surgery

## 2013-11-11 ENCOUNTER — Ambulatory Visit (INDEPENDENT_AMBULATORY_CARE_PROVIDER_SITE_OTHER): Payer: Medicare Other | Admitting: Family

## 2013-11-11 ENCOUNTER — Other Ambulatory Visit (HOSPITAL_COMMUNITY): Payer: Medicare Other

## 2013-11-11 ENCOUNTER — Encounter: Payer: Self-pay | Admitting: Family

## 2013-11-11 ENCOUNTER — Ambulatory Visit (HOSPITAL_COMMUNITY)
Admission: RE | Admit: 2013-11-11 | Discharge: 2013-11-11 | Disposition: A | Discharge status: Home / Self Care | Payer: Medicare Other | Source: Ambulatory Visit | Attending: Family | Admitting: Family

## 2013-11-11 VITALS — BP 143/67 | HR 65 | Resp 16 | Ht 62.0 in | Wt 174.0 lb

## 2013-11-11 DIAGNOSIS — I6529 Occlusion and stenosis of unspecified carotid artery: Secondary | ICD-10-CM | POA: Diagnosis not present

## 2013-11-11 DIAGNOSIS — Z48812 Encounter for surgical aftercare following surgery on the circulatory system: Secondary | ICD-10-CM

## 2013-11-11 NOTE — Patient Instructions (Signed)
Stroke Prevention Some medical conditions and behaviors are associated with an increased chance of having a stroke. You may prevent a stroke by making healthy choices and managing medical conditions. HOW CAN I REDUCE MY RISK OF HAVING A STROKE?   Stay physically active. Get at least 30 minutes of activity on most or all days.  Do not smoke. It may also be helpful to avoid exposure to secondhand smoke.  Limit alcohol use. Moderate alcohol use is considered to be:  No more than 2 drinks per day for men.  No more than 1 drink per day for nonpregnant women.  Eat healthy foods. This involves:  Eating 5 or more servings of fruits and vegetables a day.  Making dietary changes that address high blood pressure (hypertension), high cholesterol, diabetes, or obesity.  Manage your cholesterol levels.  Making food choices that are high in fiber and low in saturated fat, trans fat, and cholesterol may control cholesterol levels.  Take any prescribed medicines to control cholesterol as directed by your health care provider.  Manage your diabetes.  Controlling your carbohydrate and sugar intake is recommended to manage diabetes.  Take any prescribed medicines to control diabetes as directed by your health care provider.  Control your hypertension.  Making food choices that are low in salt (sodium), saturated fat, trans fat, and cholesterol is recommended to manage hypertension.  Take any prescribed medicines to control hypertension as directed by your health care provider.  Maintain a healthy weight.  Reducing calorie intake and making food choices that are low in sodium, saturated fat, trans fat, and cholesterol are recommended to manage weight.  Stop drug abuse.  Avoid taking birth control pills.  Talk to your health care provider about the risks of taking birth control pills if you are over 35 years old, smoke, get migraines, or have ever had a blood clot.  Get evaluated for sleep  disorders (sleep apnea).  Talk to your health care provider about getting a sleep evaluation if you snore a lot or have excessive sleepiness.  Take medicines only as directed by your health care provider.  For some people, aspirin or blood thinners (anticoagulants) are helpful in reducing the risk of forming abnormal blood clots that can lead to stroke. If you have the irregular heart rhythm of atrial fibrillation, you should be on a blood thinner unless there is a good reason you cannot take them.  Understand all your medicine instructions.  Make sure that other conditions (such as anemia or atherosclerosis) are addressed. SEEK IMMEDIATE MEDICAL CARE IF:   You have sudden weakness or numbness of the face, arm, or leg, especially on one side of the body.  Your face or eyelid droops to one side.  You have sudden confusion.  You have trouble speaking (aphasia) or understanding.  You have sudden trouble seeing in one or both eyes.  You have sudden trouble walking.  You have dizziness.  You have a loss of balance or coordination.  You have a sudden, severe headache with no known cause.  You have new chest pain or an irregular heartbeat. Any of these symptoms may represent a serious problem that is an emergency. Do not wait to see if the symptoms will go away. Get medical help at once. Call your local emergency services (911 in [place] Do not drive yourself to the hospital. Document Released: 05/10/2004 Document Revised: 08/17/2013 Document Reviewed: 10/03/2012 Exit[employer] Patient Information 2015 [employer]. This information is not intended to replace advice given   to you by your health care provider. Make sure you discuss any questions you have with your health care provider.  

## 2013-11-11 NOTE — Progress Notes (Signed)
Established Carotid Patient   History of Present Illness  [name] is a 80 y.o. female patient of Dr. Scot Dock status post left CEA in June of 2010 as well as right CEA in 2005. She returns today for follow up. She also was seen by Dr. Scot Dock on 11/04/13 for her lower extremity varicosities and he offered to patient that she could be evaluated by Dr. Donnetta Hutching or Dr. Kellie Simmering in the vein clinic to address this. She has the thigh high 20-30 mm Hg compression stockings but cannot get them on, she has the donning device which does not help. She denies claudication symptoms with walking, denies non healing wounds.  Patient has Negative history of TIA or stroke symptom.  The patient denies amaurosis fugax or monocular blindness.  The patient  denies facial drooping.  Pt. denies hemiplegia.  The patient denies receptive or expressive aphasia.     Pt denies New Medical or Surgical History.  Pt Diabetic: No Pt smoker: former smoker, quit in 1989  Pt meds include: Statin : Yes ASA: No Other anticoagulants/antiplatelets: coumadin for atrial fib   Past Medical History  Diagnosis Date  . Coronary atherosclerosis of native coronary artery     RCA stent in 2003; residual 60% mid Cx and OM2  . Carotid artery disease     RIght CEA 05/2004  . Essential hypertension, benign   . Hyperlipidemia   . Renal artery stenosis     Bilateral renal artery stents 2004  . Tobacco abuse, in remission     40 pack year stopped in 1989  . Atrial fibrillation   . Venous insufficiency (chronic) (peripheral)     Pedal edema  . Chronic anticoagulation   . Obesity     Social History History  Substance Use Topics  . Smoking status: Former Smoker    Types: Cigarettes    Quit date: 04/30/1987  . Smokeless tobacco: Never Used  . Alcohol Use: No    Family History Family History  Problem Relation Age of Onset  . Lung cancer Mother     Surgical History Past Surgical History  Procedure Laterality  Date  . Carotid endarterectomy      Right in 2005, left in 2010  . Colonoscopy  Never    Declines    Allergies  Allergen Reactions  . Diltiazem Other (See Comments)    Edema  . Verapamil Other (See Comments)    Diarrhea    Current Outpatient Prescriptions  Medication Sig Dispense Refill  . atorvastatin (LIPITOR) 80 MG tablet TAKE 1 TABLET BY MOUTH AT BEDTIME  60 tablet  6  . bisoprolol (ZEBETA) 10 MG tablet Take 1 tablet (10 mg total) by mouth 2 (two) times daily.  60 tablet  6  . furosemide (LASIX) 80 MG tablet Take 80 mg by mouth 2 (two) times daily.        Marland Kitchen losartan (COZAAR) 100 MG tablet take 1 tablet by mouth once daily  90 tablet  3  . potassium chloride SA (K-DUR,KLOR-CON) 20 MEQ tablet take 1 tablet by mouth once daily  30 tablet  6  . terazosin (HYTRIN) 5 MG capsule TAKE ONE CAPSULE BY MOUTH AT BEDTIME  90 capsule  3  . warfarin (COUMADIN) 5 MG tablet Take 1 tablet daily  40 tablet  3   No current facility-administered medications for this visit.    Review of Systems : See HPI for pertinent positives and negatives.  Physical Examination  Filed Vitals:  11/11/13 1046 11/11/13 1051  BP: 141/68 143/67  Pulse: 64 65  Resp:  16  Height:  5\' 2"  (1.575 m)  Weight:  174 lb (78.926 kg)  SpO2:  95%   Body mass index is 31.82 kg/(m^2).  General: WDWN obese female in NAD GAIT: normal Eyes: PERRLA Pulmonary:  Non-labored, CTAB, Negative  Rales, Negative rhonchi, & Negative wheezing.  Cardiac: irregular Rhythm  VASCULAR EXAM Carotid Bruits Right Left   Negative Negative    Aorta is not palpable. Radial pulses are 2+ palpable and equal.                                                                                                                        Gastrointestinal: soft, nontender, BS WNL, no r/g,  negative masses.  Musculoskeletal: Negative muscle atrophy/wasting. M/S 5/5 throughout, Extremities without ischemic changes. Is wearing ace wrap at left lower  leg as compression hose since she cannot get the compression hose on.   Neurologic: A&O X 3; Appropriate Affect ; SENSATION ;normal;  Speech is normal CN 2-12 intact except is slightly hard of hearing, Pain and light touch intact in extremities, Motor exam as listed above.   Non-Invasive Vascular Imaging CAROTID DUPLEX 11/11/2013   CEREBROVASCULAR DUPLEX EVALUATION    INDICATION: Carotid artery disease     PREVIOUS INTERVENTION(S): Right carotid endarterectomy 2005. Left carotid endarterectomy 09/29/2008.    DUPLEX EXAM:     RIGHT  LEFT  Peak Systolic Velocities (cm/s) End Diastolic Velocities (cm/s) Plaque LOCATION Peak Systolic Velocities (cm/s) End Diastolic Velocities (cm/s) Plaque  85 15  CCA PROXIMAL 64 12   67 13  CCA MID 81 16   89 14  CCA DISTAL 87 25   69 10  ECA 126 5   81 15 HT ICA PROXIMAL 136 22 HT  123 24  ICA MID 119 26   108 18  ICA DISTAL 90 20      ICA / CCA Ratio (PSV)   Antegrade  Vertebral Flow Antegrade   409 Brachial Systolic Pressure (mmHg) 811  Triphasic  Brachial Artery Waveforms Triphasic     Plaque Morphology:  HM = Homogeneous, HT = Heterogeneous, CP = Calcific Plaque, SP = Smooth Plaque, IP = Irregular Plaque     ADDITIONAL FINDINGS:     IMPRESSION: Patent right and left carotid endarterectomy sites with evidence of mild hyperplasia bilaterally.     Compared to the previous exam:  No significant change in comparison to the last exam on     Assessment: [name] is a 80 y.o. female who presents with asymptomatic patent right and left carotid endarterectomy sites with evidence of mild hyperplasia bilaterally. The  ICA stenosis is  Unchanged from previous exam.  Plan: Follow-up in 1 year with Carotid Duplex scan.   I discussed in depth with the patient the nature of atherosclerosis, and emphasized the importance of maximal medical management including strict control of blood pressure, blood glucose, and lipid  levels, obtaining  regular exercise, and continued cessation of smoking.  The patient is aware that without maximal medical management the underlying atherosclerotic disease process will progress, limiting the benefit of any interventions. The patient was given information about stroke prevention and what symptoms should prompt the patient to seek immediate medical care. Thank you for allowing [place] to participate in this patient's care.  Clemon Chambers, RN, MSN, FNP-C [facility] Office: [phone]  Clinic Physician: Scot Dock  11/11/2013 10:00 AM

## 2013-11-11 NOTE — Addendum Note (Signed)
Addended by: Mena Goes on: 11/11/2013 12:19 PM   Modules accepted: Orders

## 2013-11-25 ENCOUNTER — Ambulatory Visit (INDEPENDENT_AMBULATORY_CARE_PROVIDER_SITE_OTHER): Payer: Medicare Other | Admitting: *Deleted

## 2013-11-25 DIAGNOSIS — Z5181 Encounter for therapeutic drug level monitoring: Secondary | ICD-10-CM

## 2013-11-25 DIAGNOSIS — I4891 Unspecified atrial fibrillation: Secondary | ICD-10-CM

## 2013-11-25 LAB — POCT INR: INR: 2.6

## 2013-12-23 ENCOUNTER — Ambulatory Visit (INDEPENDENT_AMBULATORY_CARE_PROVIDER_SITE_OTHER): Payer: Medicare Other | Admitting: *Deleted

## 2013-12-23 DIAGNOSIS — I4891 Unspecified atrial fibrillation: Secondary | ICD-10-CM | POA: Diagnosis not present

## 2013-12-23 DIAGNOSIS — Z5181 Encounter for therapeutic drug level monitoring: Secondary | ICD-10-CM | POA: Diagnosis not present

## 2013-12-23 LAB — POCT INR: INR: 2.6

## 2013-12-29 ENCOUNTER — Encounter: Payer: Self-pay | Admitting: Cardiology

## 2013-12-29 ENCOUNTER — Ambulatory Visit (INDEPENDENT_AMBULATORY_CARE_PROVIDER_SITE_OTHER): Payer: Medicare Other | Admitting: Cardiology

## 2013-12-29 VITALS — BP 132/82 | HR 63 | Ht 62.0 in | Wt 179.0 lb

## 2013-12-29 DIAGNOSIS — I10 Essential (primary) hypertension: Secondary | ICD-10-CM | POA: Diagnosis not present

## 2013-12-29 DIAGNOSIS — I2510 Atherosclerotic heart disease of native coronary artery without angina pectoris: Secondary | ICD-10-CM | POA: Diagnosis not present

## 2013-12-29 DIAGNOSIS — I4891 Unspecified atrial fibrillation: Secondary | ICD-10-CM

## 2013-12-29 DIAGNOSIS — I482 Chronic atrial fibrillation, unspecified: Secondary | ICD-10-CM

## 2013-12-29 NOTE — Assessment & Plan Note (Signed)
Chronic, rate controlled and asymptomatic. Continue current regimen including Coumadin.

## 2013-12-29 NOTE — Assessment & Plan Note (Signed)
Continues on Lipitor, LDL 43 in March. Followup FLP and LFT for next visit.

## 2013-12-29 NOTE — Assessment & Plan Note (Signed)
Symptomatically stable medical therapy. Continue observation, 6 month followup arranged.

## 2013-12-29 NOTE — Progress Notes (Signed)
Clinical Summary [name] is a 80 y.o.female last seen in Jun 29, 2022 of this year. Interval followup with [facility] noted, stable bilateral CEA sites, also management of varicose veins.  She tells me that she has been doing very well, no angina symptoms or unusual shortness of breath. Cardiac medications are stable. She remains functional with her ADLs, takes care of her own house. Her son helps her take care of the yard since her husband passed away.  Lab work from 06-29-2022 showed potassium 4.1, BUN 20, creatinine 0.9, AST and ALT normal, hemoglobin 13.4, platelets 183, cholesterol 103, triglycerides 106, HDL 39, LDL 43.   Allergies  Allergen Reactions  . Diltiazem Other (See Comments)    Edema  . Verapamil Other (See Comments)    Diarrhea    Current Outpatient Prescriptions  Medication Sig Dispense Refill  . atorvastatin (LIPITOR) 80 MG tablet TAKE 1 TABLET BY MOUTH AT BEDTIME  60 tablet  6  . bisoprolol (ZEBETA) 10 MG tablet Take 1 tablet (10 mg total) by mouth 2 (two) times daily.  60 tablet  6  . furosemide (LASIX) 80 MG tablet Take 80 mg by mouth 2 (two) times daily.        Marland Kitchen losartan (COZAAR) 100 MG tablet take 1 tablet by mouth once daily  90 tablet  3  . potassium chloride SA (K-DUR,KLOR-CON) 20 MEQ tablet take 1 tablet by mouth once daily  30 tablet  6  . terazosin (HYTRIN) 5 MG capsule TAKE ONE CAPSULE BY MOUTH AT BEDTIME  90 capsule  3  . warfarin (COUMADIN) 5 MG tablet Take 1 tablet daily  40 tablet  3   No current facility-administered medications for this visit.    Past Medical History  Diagnosis Date  . Coronary atherosclerosis of native coronary artery     RCA stent in 2003; residual 60% mid Cx and OM2  . Carotid artery disease     RIght CEA 05/2004  . Essential hypertension, benign   . Hyperlipidemia   . Renal artery stenosis     Bilateral renal artery stents 2004  . Tobacco abuse, in remission     40 pack year stopped in 1989  . Atrial fibrillation   . Venous  insufficiency (chronic) (peripheral)     Pedal edema  . Chronic anticoagulation   . Obesity     Social History Ms. Dales reports that she quit smoking about 26 years ago. Her smoking use included Cigarettes. She smoked 0.00 packs per day. She has never used smokeless tobacco. Ms. Purdie reports that she does not drink alcohol.  Review of Systems No palpitations, dizziness, syncope. Chronic arthritic pains. Other systems reviewed and negative.  Physical Examination Filed Vitals:   12/29/13 1050  BP: 132/82  Pulse: 63   Filed Weights   12/29/13 1050  Weight: 179 lb (81.194 kg)    Patient is in no distress.  HEENT: Conjunctiva and lids normal, oropharynx clear.  Neck: Supple, no elevated JVP or carotid bruits, no thyromegaly.  Lungs: Clear to auscultation, nonlabored breathing at rest.  Cardiac: Irregularly irregular, no S3 or significant systolic murmur, no pericardial rub.  Abdomen: Soft, nontender,bowel sounds present.  Extremities: No pitting edema, distal pulses 2+.  Skin: Warm and dry.  Musculoskeletal: No kyphosis.  Neuropsychiatric: Alert and oriented x3, affect grossly appropriate.   Problem List and Plan   Atrial fibrillation Chronic, rate controlled and asymptomatic. Continue current regimen including Coumadin.  Coronary atherosclerosis of native coronary artery Symptomatically  stable medical therapy. Continue observation, 6 month followup arranged.  Hyperlipidemia Continues on Lipitor, LDL 43 in March. Followup FLP and LFT for next visit.  Hypertension Continue current regimen.    Satira Sark, M.D., F.A.C.C.

## 2013-12-29 NOTE — Patient Instructions (Signed)
Your physician wants you to follow-up in: 6 months You will receive a reminder letter in the mail two months in advance. If you don't receive a letter, please call our office to schedule the follow-up appointment.   Your physician recommends that you continue on your current medications as directed. Please refer to the Current Medication list given to you today.     Please have blood work done just prior to your follow up visit (LIPID,LFT's)     Thank you for choosing [facility] !

## 2013-12-29 NOTE — Assessment & Plan Note (Signed)
Continue current regimen

## 2014-01-11 ENCOUNTER — Other Ambulatory Visit: Payer: Self-pay | Admitting: Cardiology

## 2014-01-20 ENCOUNTER — Ambulatory Visit (INDEPENDENT_AMBULATORY_CARE_PROVIDER_SITE_OTHER): Payer: Medicare Other | Admitting: *Deleted

## 2014-01-20 DIAGNOSIS — I4891 Unspecified atrial fibrillation: Secondary | ICD-10-CM

## 2014-01-20 DIAGNOSIS — Z5181 Encounter for therapeutic drug level monitoring: Secondary | ICD-10-CM

## 2014-01-20 LAB — POCT INR: INR: 2.8

## 2014-02-08 ENCOUNTER — Encounter: Payer: Self-pay | Admitting: Vascular Surgery

## 2014-02-09 ENCOUNTER — Ambulatory Visit (INDEPENDENT_AMBULATORY_CARE_PROVIDER_SITE_OTHER): Payer: Medicare Other | Admitting: Vascular Surgery

## 2014-02-09 ENCOUNTER — Encounter: Payer: Self-pay | Admitting: Vascular Surgery

## 2014-02-09 VITALS — BP 114/68 | HR 60 | Resp 16 | Ht 62.0 in | Wt 178.0 lb

## 2014-02-09 DIAGNOSIS — I2510 Atherosclerotic heart disease of native coronary artery without angina pectoris: Secondary | ICD-10-CM | POA: Diagnosis not present

## 2014-02-09 DIAGNOSIS — I868 Varicose veins of other specified sites: Secondary | ICD-10-CM | POA: Diagnosis not present

## 2014-02-09 DIAGNOSIS — I8390 Asymptomatic varicose veins of unspecified lower extremity: Secondary | ICD-10-CM

## 2014-02-09 NOTE — Progress Notes (Signed)
Problems with Activities of Daily Living Secondary to Leg Pain  1. Mrs.  Vantil states she has difficulty with all activities that require prolonged standing (cooking , cleaning, shopping) due to leg pain.    2. Mrs. Eaker states she has difficulty with activities that require squatting and bending (especially yard work)  due to leg pain.      Failure of  Conservative Therapy:  1. Worn 20-30 mm Hg thigh high compression hose >3 months with no relief of symptoms.  2. Frequently elevates legs-no relief of symptoms  3. Taken Ibuprofen 600 Mg TID with no relief of symptoms.  The patient presents today for continued discussion of her venous hypertension. She has been compliant with her compression garments but continues to have aching discomfort particularly over the knees and calves with prolonged standing. She does have marked bilateral varicosities. She has had bleeding from telangiectasia at the level of her ankle on the right and left side having to go to the emergency department for the most recent of these.  On physical exam she does have marked changes of venous hypertension with a very superficial telangiectasia over her ankles bilaterally. Also has marked very large varicosities from her knees distally.  Did reimage her saphenous vein with some slight ultrasound this does show an large saphenous veins refluxing into these varicosities bilaterally.  Impression and plan: Failed treatment with conservative treatment for bilateral severe venous hypertension. Discussed at length with the patient and her daughter present. I feel that she is a very good candidate for laser ablation of her great saphenous vein and potentially subsequent stab phlebectomy of her lower extremity varicosities. She does have significant discomfort with this and does wish to proceed. The procedure as an outpatient seizure lasting approximately 1 hour under local anesthesia. Explain the slight risk for DVT. She  wished to proceed as soon as possible with her left leg and then the subsequent treatment with her right leg

## 2014-02-10 ENCOUNTER — Other Ambulatory Visit: Payer: Self-pay | Admitting: *Deleted

## 2014-02-10 DIAGNOSIS — I83891 Varicose veins of right lower extremities with other complications: Secondary | ICD-10-CM

## 2014-02-10 DIAGNOSIS — I83892 Varicose veins of left lower extremities with other complications: Secondary | ICD-10-CM

## 2014-02-16 ENCOUNTER — Encounter: Payer: Self-pay | Admitting: Vascular Surgery

## 2014-02-17 ENCOUNTER — Other Ambulatory Visit: Payer: Medicare Other | Admitting: Vascular Surgery

## 2014-02-23 ENCOUNTER — Ambulatory Visit: Payer: Medicare Other | Admitting: Vascular Surgery

## 2014-02-23 ENCOUNTER — Encounter (HOSPITAL_COMMUNITY): Payer: Medicare Other

## 2014-03-03 ENCOUNTER — Ambulatory Visit (INDEPENDENT_AMBULATORY_CARE_PROVIDER_SITE_OTHER): Payer: Medicare Other | Admitting: *Deleted

## 2014-03-03 DIAGNOSIS — I4891 Unspecified atrial fibrillation: Secondary | ICD-10-CM | POA: Diagnosis not present

## 2014-03-03 DIAGNOSIS — Z5181 Encounter for therapeutic drug level monitoring: Secondary | ICD-10-CM

## 2014-03-03 LAB — POCT INR: INR: 2.4

## 2014-03-15 ENCOUNTER — Other Ambulatory Visit: Payer: Self-pay | Admitting: Cardiology

## 2014-03-15 MED ORDER — POTASSIUM CHLORIDE CRYS ER 20 MEQ PO TBCR: EXTENDED_RELEASE_TABLET | ORAL | Status: DC

## 2014-03-15 NOTE — Telephone Encounter (Signed)
Refill complete 

## 2014-03-15 NOTE — Telephone Encounter (Signed)
Received fax refill request  Rx # [id number] Medication:  Potassium CL ER 20 meq tablet Qty 30 Sig:  Take one tablet by mouth once daily Physician:  [name]

## 2014-03-18 ENCOUNTER — Other Ambulatory Visit: Payer: Self-pay | Admitting: Cardiology

## 2014-04-14 ENCOUNTER — Ambulatory Visit (INDEPENDENT_AMBULATORY_CARE_PROVIDER_SITE_OTHER): Payer: Medicare Other | Admitting: *Deleted

## 2014-04-14 DIAGNOSIS — Z5181 Encounter for therapeutic drug level monitoring: Secondary | ICD-10-CM | POA: Diagnosis not present

## 2014-04-14 DIAGNOSIS — I4891 Unspecified atrial fibrillation: Secondary | ICD-10-CM

## 2014-04-14 LAB — POCT INR: INR: 2.8

## 2014-04-26 ENCOUNTER — Telehealth: Payer: Self-pay | Admitting: Cardiology

## 2014-04-26 NOTE — Telephone Encounter (Signed)
Received fax refill request  Rx # [id number] Medication:  Bisoprolol Fumarate 10 mg tab Qty 60 Sig:  Take one tablet by mouth twice a day Physician:  [name]

## 2014-05-04 ENCOUNTER — Other Ambulatory Visit: Payer: Self-pay | Admitting: *Deleted

## 2014-05-04 MED ORDER — BISOPROLOL FUMARATE 10 MG PO TABS: 10.0000 mg | ORAL_TABLET | Freq: Two times a day (BID) | ORAL | Status: DC

## 2014-05-04 NOTE — Telephone Encounter (Signed)
Fax from [facility] need bisoptolol 10 mg called in

## 2014-05-26 ENCOUNTER — Other Ambulatory Visit: Payer: Self-pay | Admitting: Cardiology

## 2014-05-26 ENCOUNTER — Ambulatory Visit (INDEPENDENT_AMBULATORY_CARE_PROVIDER_SITE_OTHER): Payer: Medicare Other | Admitting: *Deleted

## 2014-05-26 DIAGNOSIS — I4891 Unspecified atrial fibrillation: Secondary | ICD-10-CM | POA: Diagnosis not present

## 2014-05-26 DIAGNOSIS — Z5181 Encounter for therapeutic drug level monitoring: Secondary | ICD-10-CM | POA: Diagnosis not present

## 2014-05-26 LAB — POCT INR: INR: 2.2

## 2014-05-26 MED ORDER — LOSARTAN POTASSIUM 100 MG PO TABS: 100.0000 mg | ORAL_TABLET | Freq: Every day | ORAL | Status: DC

## 2014-05-26 NOTE — Telephone Encounter (Signed)
Received fax refill request  Rx # [id number] Medication:  [place] Potassium 100 mg tab Qty 90 Sig:  Take one tablet by mouth once daily Physician:  mcDowell

## 2014-05-26 NOTE — Telephone Encounter (Signed)
escribed rx per request

## 2014-06-11 DIAGNOSIS — Z6833 Body mass index (BMI) 33.0-33.9, adult: Secondary | ICD-10-CM | POA: Diagnosis not present

## 2014-06-11 DIAGNOSIS — E782 Mixed hyperlipidemia: Secondary | ICD-10-CM | POA: Diagnosis not present

## 2014-06-11 DIAGNOSIS — I10 Essential (primary) hypertension: Secondary | ICD-10-CM | POA: Diagnosis not present

## 2014-06-11 DIAGNOSIS — R7301 Impaired fasting glucose: Secondary | ICD-10-CM | POA: Diagnosis not present

## 2014-06-11 DIAGNOSIS — E6609 Other obesity due to excess calories: Secondary | ICD-10-CM | POA: Diagnosis not present

## 2014-06-15 DIAGNOSIS — I10 Essential (primary) hypertension: Secondary | ICD-10-CM | POA: Diagnosis not present

## 2014-06-15 DIAGNOSIS — E6609 Other obesity due to excess calories: Secondary | ICD-10-CM | POA: Diagnosis not present

## 2014-06-15 DIAGNOSIS — E782 Mixed hyperlipidemia: Secondary | ICD-10-CM | POA: Diagnosis not present

## 2014-06-15 DIAGNOSIS — R7301 Impaired fasting glucose: Secondary | ICD-10-CM | POA: Diagnosis not present

## 2014-06-15 DIAGNOSIS — Z6833 Body mass index (BMI) 33.0-33.9, adult: Secondary | ICD-10-CM | POA: Diagnosis not present

## 2014-07-05 ENCOUNTER — Ambulatory Visit (INDEPENDENT_AMBULATORY_CARE_PROVIDER_SITE_OTHER): Payer: Medicare Other | Admitting: Cardiology

## 2014-07-05 ENCOUNTER — Ambulatory Visit (INDEPENDENT_AMBULATORY_CARE_PROVIDER_SITE_OTHER): Payer: Medicare Other | Admitting: *Deleted

## 2014-07-05 ENCOUNTER — Encounter: Payer: Self-pay | Admitting: Cardiology

## 2014-07-05 VITALS — BP 130/66 | HR 67 | Ht 62.0 in | Wt 180.0 lb

## 2014-07-05 DIAGNOSIS — I4891 Unspecified atrial fibrillation: Secondary | ICD-10-CM | POA: Diagnosis not present

## 2014-07-05 DIAGNOSIS — I2510 Atherosclerotic heart disease of native coronary artery without angina pectoris: Secondary | ICD-10-CM

## 2014-07-05 DIAGNOSIS — E782 Mixed hyperlipidemia: Secondary | ICD-10-CM

## 2014-07-05 DIAGNOSIS — I482 Chronic atrial fibrillation, unspecified: Secondary | ICD-10-CM

## 2014-07-05 DIAGNOSIS — Z5181 Encounter for therapeutic drug level monitoring: Secondary | ICD-10-CM

## 2014-07-05 DIAGNOSIS — R600 Localized edema: Secondary | ICD-10-CM

## 2014-07-05 LAB — POCT INR: INR: 2.2

## 2014-07-05 NOTE — Patient Instructions (Signed)
Your physician wants you to follow-up in: 6 months with Dr. McDowell You will receive a reminder letter in the mail two months in advance. If you don't receive a letter, please call our office to schedule the follow-up appointment.  Your physician recommends that you continue on your current medications as directed. Please refer to the Current Medication list given to you today.  Thank you for choosing [facility]!!    

## 2014-07-05 NOTE — Progress Notes (Signed)
Cardiology Office Note  Date: 07/05/2014   ID: [name], DOB [birth year], MRN [id number]  PCP: Purvis Kilts, MD  Primary Cardiologist: Rozann Lesches, MD   Chief Complaint  Patient presents with  . Coronary Artery Disease  . Atrial Fibrillation    History of Present Illness: [name] is a 81 y.o. female last seen in September 2015. She presents for a routine cardiac follow-up. She does not endorse any angina symptoms, typically NYHA class II symptoms with regular activities. No problems with palpitations, and no spontaneous bleeding problems on Coumadin. She remains on high-dose Lasix with history of chronic leg edema and venous insufficiency.  She did have follow-up lab work recently with Dr. Hilma Favors, results being requested for review. As of March of last year, creatinine and potassium levels were normal, and her lipids were well controlled.  We continue observation with prior history of RCA stenting in 2013 and moderate circumflex disease.   Past Medical History  Diagnosis Date  . Coronary atherosclerosis of native coronary artery     RCA stent in 2003; residual 60% mid Cx and OM2  . Carotid artery disease     RIght CEA 05/2004  . Essential hypertension, benign   . Hyperlipidemia   . Renal artery stenosis     Bilateral renal artery stents 2004  . Tobacco abuse, in remission     40 pack year stopped in 1989  . Atrial fibrillation   . Venous insufficiency (chronic) (peripheral)     Pedal edema  . Chronic anticoagulation   . Obesity     Past Surgical History  Procedure Laterality Date  . Carotid endarterectomy      Right in 2005, left in 2010  . Colonoscopy  Never    Declines    Current Outpatient Prescriptions  Medication Sig Dispense Refill  . atorvastatin (LIPITOR) 80 MG tablet take 1 tablet at bedtime 60 tablet 6  . bisoprolol (ZEBETA) 10 MG tablet Take 1 tablet (10 mg total) by mouth 2 (two) times daily. 60 tablet 6  . furosemide  (LASIX) 80 MG tablet Take 80 mg by mouth 2 (two) times daily.      Marland Kitchen losartan (COZAAR) 100 MG tablet Take 1 tablet (100 mg total) by mouth daily. 90 tablet 3  . potassium chloride SA (K-DUR,KLOR-CON) 20 MEQ tablet take 1 tablet by mouth once daily 30 tablet 11  . terazosin (HYTRIN) 5 MG capsule TAKE ONE CAPSULE BY MOUTH AT BEDTIME 90 capsule 3  . warfarin (COUMADIN) 5 MG tablet take 1 tablet by mouth once daily 40 tablet 3   No current facility-administered medications for this visit.    Allergies:  Diltiazem and Verapamil   Social History: The patient  reports that she quit smoking about 27 years ago. Her smoking use included Cigarettes. She has never used smokeless tobacco. She reports that she does not drink alcohol or use illicit drugs.   ROS:  Please see the history of present illness. Otherwise, complete review of systems is positive for none.  All other systems are reviewed and negative.    Physical Exam: VS:  BP 130/66 mmHg  Pulse 67  Ht 5\' 2"  (1.575 m)  Wt 180 lb (81.647 kg)  BMI 32.91 kg/m2  SpO2 95%, BMI Body mass index is 32.91 kg/(m^2).  Wt Readings from Last 3 Encounters:  07/05/14 180 lb (81.647 kg)  02/09/14 178 lb (80.74 kg)  12/29/13 179 lb (81.194 kg)     Patient  is in no distress.  HEENT: Conjunctiva and lids normal, oropharynx clear.  Neck: Supple, no elevated JVP or carotid bruits, no thyromegaly.  Lungs: Clear to auscultation, nonlabored breathing at rest.  Cardiac: Irregularly irregular, no S3 or significant systolic murmur, no pericardial rub.  Abdomen: Soft, nontender,bowel sounds present.  Extremities: No pitting edema, distal pulses 2+.  Skin: Warm and dry.  Musculoskeletal: No kyphosis.  Neuropsychiatric: Alert and oriented x3, affect grossly appropriate.   ECG: ECG is ordered today and reviewed showing rate-controlled atrial fibrillation with R' in lead V1.  Recent Labwork: 07/06/2013: ALT 15; AST 20; BUN 20; Creatinine 0.90;  Hemoglobin 13.4; Platelets 183; Potassium 4.1; Sodium 144     Component Value Date/Time   CHOL 103 07/06/2013 1000   TRIG 106 07/06/2013 1000   HDL 39* 07/06/2013 1000   CHOLHDL 2.6 07/06/2013 1000   VLDL 21 07/06/2013 1000   LDLCALC 43 07/06/2013 1000    ASSESSMENT AND PLAN:  1. Symptom medically stable with CAD status post remote RCA stenting as outlined above. She continues on beta blocker, ARB, and Lipitor.  2. Chronic leg edema and venous insufficiency on high-dose Lasix. Requesting most recent lab work from Dr. Hilma Favors.  3. Hyperlipidemia, on Lipitor, LDL 43 in March of last year.  4. Chronic atrial fibrillation, heart rate controlled and on Coumadin.   Current medicines are reviewed with the patient today.    Orders Placed This Encounter  Procedures  . EKG 12-Lead    Disposition: FU with me in 6 months.   Signed, Satira Sark, MD, [facility] 07/05/2014 4:22 PM    [facility] at [facility] [address] [place], [place] [address] Phone: [phone]; Fax: [phone]

## 2014-08-18 ENCOUNTER — Ambulatory Visit (INDEPENDENT_AMBULATORY_CARE_PROVIDER_SITE_OTHER): Payer: Medicare Other | Admitting: *Deleted

## 2014-08-18 DIAGNOSIS — Z5181 Encounter for therapeutic drug level monitoring: Secondary | ICD-10-CM

## 2014-08-18 DIAGNOSIS — I4891 Unspecified atrial fibrillation: Secondary | ICD-10-CM

## 2014-08-18 LAB — POCT INR: INR: 2

## 2014-09-20 ENCOUNTER — Other Ambulatory Visit: Payer: Self-pay

## 2014-09-20 MED ORDER — WARFARIN SODIUM 5 MG PO TABS: 5.0000 mg | ORAL_TABLET | Freq: Every day | ORAL | Status: DC

## 2014-09-20 MED ORDER — TERAZOSIN HCL 5 MG PO CAPS: 5.0000 mg | ORAL_CAPSULE | Freq: Every day | ORAL | Status: DC

## 2014-09-29 ENCOUNTER — Ambulatory Visit (INDEPENDENT_AMBULATORY_CARE_PROVIDER_SITE_OTHER): Payer: Medicare Other | Admitting: *Deleted

## 2014-09-29 DIAGNOSIS — Z5181 Encounter for therapeutic drug level monitoring: Secondary | ICD-10-CM | POA: Diagnosis not present

## 2014-09-29 DIAGNOSIS — I4891 Unspecified atrial fibrillation: Secondary | ICD-10-CM | POA: Diagnosis not present

## 2014-09-29 LAB — POCT INR: INR: 2.2

## 2014-11-10 ENCOUNTER — Ambulatory Visit (INDEPENDENT_AMBULATORY_CARE_PROVIDER_SITE_OTHER): Payer: Medicare Other | Admitting: *Deleted

## 2014-11-10 DIAGNOSIS — I4891 Unspecified atrial fibrillation: Secondary | ICD-10-CM | POA: Diagnosis not present

## 2014-11-10 DIAGNOSIS — Z5181 Encounter for therapeutic drug level monitoring: Secondary | ICD-10-CM

## 2014-11-10 LAB — POCT INR: INR: 2.2

## 2014-11-15 ENCOUNTER — Encounter: Payer: Self-pay | Admitting: Family

## 2014-11-17 ENCOUNTER — Ambulatory Visit (HOSPITAL_COMMUNITY)
Admission: RE | Admit: 2014-11-17 | Discharge: 2014-11-17 | Disposition: A | Discharge status: Home / Self Care | Payer: Medicare Other | Source: Ambulatory Visit | Attending: Family | Admitting: Family

## 2014-11-17 ENCOUNTER — Ambulatory Visit (INDEPENDENT_AMBULATORY_CARE_PROVIDER_SITE_OTHER): Payer: Medicare Other | Admitting: Family

## 2014-11-17 ENCOUNTER — Encounter: Payer: Self-pay | Admitting: Family

## 2014-11-17 VITALS — BP 130/59 | HR 59 | Temp 96.7°F | Resp 16 | Ht 62.0 in | Wt 180.0 lb

## 2014-11-17 DIAGNOSIS — I6523 Occlusion and stenosis of bilateral carotid arteries: Secondary | ICD-10-CM

## 2014-11-17 DIAGNOSIS — Z9889 Other specified postprocedural states: Secondary | ICD-10-CM

## 2014-11-17 DIAGNOSIS — Z48812 Encounter for surgical aftercare following surgery on the circulatory system: Secondary | ICD-10-CM

## 2014-11-17 NOTE — Progress Notes (Signed)
Established Carotid Patient   History of Present Illness  [name] is a 81 y.o. female patient of Dr. Scot Dock who is status post left CEA in June of 2010 as well as right CEA in 2005. She returns today for follow up. She met with Dr. Donnetta Hutching in October 2015, had a venous Duplex, discussed laser ablation of GSV and stab phlebectomy of varicosities; pt decided not to proceed. She has the thigh high 20-30 mm Hg compression stockings but cannot get them on, she has the donning device which does not help. She denies claudication symptoms with walking, denies non healing wounds.  Patient denies any history of TIA or stroke symptoms. Specifically th patient denies a history of amaurosis fugax or monocular blindness, unilateral facial drooping, hemiplegia, or receptive or expressive aphasia.   Pt denies New Medical or Surgical History.  Pt Diabetic: No Pt smoker: former smoker, quit in 1989  Pt meds include: Statin : Yes ASA: No Other anticoagulants/antiplatelets: coumadin for atrial fib   Past Medical History  Diagnosis Date  . Coronary atherosclerosis of native coronary artery     RCA stent in 2003; residual 60% mid Cx and OM2  . Carotid artery disease     RIght CEA 05/2004  . Essential hypertension, benign   . Hyperlipidemia   . Renal artery stenosis     Bilateral renal artery stents 2004  . Tobacco abuse, in remission     40 pack year stopped in 1989  . Atrial fibrillation   . Venous insufficiency (chronic) (peripheral)     Pedal edema  . Chronic anticoagulation   . Obesity     Social History History  Substance Use Topics  . Smoking status: Former Smoker    Types: Cigarettes    Quit date: 04/30/1987  . Smokeless tobacco: Never Used  . Alcohol Use: No    Family History Family History  Problem Relation Age of Onset  . Lung cancer Mother   . Cancer Mother   . Hypertension Daughter   . Hyperlipidemia Daughter   . Hypertension Son   . Hypertension  Daughter     Surgical History Past Surgical History  Procedure Laterality Date  . Carotid endarterectomy      Right in 2005, left in 2010  . Colonoscopy  Never    Declines    Allergies  Allergen Reactions  . Diltiazem Other (See Comments)    Edema  . Verapamil Other (See Comments)    Diarrhea    Current Outpatient Prescriptions  Medication Sig Dispense Refill  . atorvastatin (LIPITOR) 80 MG tablet take 1 tablet at bedtime 60 tablet 6  . bisoprolol (ZEBETA) 10 MG tablet Take 1 tablet (10 mg total) by mouth 2 (two) times daily. 60 tablet 6  . furosemide (LASIX) 80 MG tablet Take 80 mg by mouth 2 (two) times daily.      Marland Kitchen losartan (COZAAR) 100 MG tablet Take 1 tablet (100 mg total) by mouth daily. 90 tablet 3  . potassium chloride SA (K-DUR,KLOR-CON) 20 MEQ tablet take 1 tablet by mouth once daily 30 tablet 11  . terazosin (HYTRIN) 5 MG capsule Take 1 capsule (5 mg total) by mouth at bedtime. 90 capsule 3  . warfarin (COUMADIN) 5 MG tablet Take 1 tablet (5 mg total) by mouth daily. 40 tablet 3   No current facility-administered medications for this visit.    Review of Systems : See HPI for pertinent positives and negatives.  Physical Examination  Filed Vitals:  11/17/14 1131 11/17/14 1134  BP: 125/74 130/59  Pulse: 59 59  Temp: 96.7 F (35.9 C)   Resp: 16   Height: $Remove'5\' 2"'AsJjMdc$  (1.575 m)   Weight: 180 lb (81.647 kg)   SpO2: 94%    Body mass index is 32.91 kg/(m^2).  General: WDWN obese female in NAD GAIT: normal Eyes: PERRLA Pulmonary: Non-labored, CTAB, Negative Rales, Negative rhonchi, & Negative wheezing.  Cardiac: irregular Rhythm  VASCULAR EXAM Carotid Bruits Right Left   Negative Negative   Aorta is not palpable. Radial pulses are 2+ palpable and equal.      Gastrointestinal: soft, nontender, BS WNL, no r/g, no palpable  masses.  Musculoskeletal: Negative muscle atrophy/wasting. M/S 5/5 throughout, Extremities without ischemic changes. Multiple varicosities in lower legs.   Neurologic: A&O X 3; Appropriate Affect,  Speech is normal CN 2-12 intact except is slightly hard of hearing, Pain and light touch intact in extremities, Motor exam as listed above.        Non-Invasive Vascular Imaging CAROTID DUPLEX 11/17/2014   CEREBROVASCULAR DUPLEX EVALUATION    INDICATION: Carotid endarterectomy     PREVIOUS INTERVENTION(S): Right carotid endarterectomy in 2005; Left carotid endarterectomy on 09/29/08    DUPLEX EXAM:     RIGHT  LEFT  Peak Systolic Velocities (cm/s) End Diastolic Velocities (cm/s) Plaque LOCATION Peak Systolic Velocities (cm/s) End Diastolic Velocities (cm/s) Plaque  76 12  CCA PROXIMAL 74 15 HT  68 12 HT CCA MID 69 17 HT  88 19 HT CCA DISTAL 93 17 HT  75 0  ECA 105 0   100 16  ICA PROXIMAL 113 22   89 25  ICA MID 108 24   96 29  ICA DISTAL 81 18     Not Calculated ICA / CCA Ratio (PSV) Not Calculated  Antegrade Vertebral Flow Antegrade   Brachial Systolic Pressure (mmHg)   Multiphasic (subclavian artery) Brachial Artery Waveforms Multiphasic (subclavian artery)    Plaque Morphology:  HM = Homogeneous, HT = Heterogeneous, CP = Calcific Plaque, SP = Smooth Plaque, IP = Irregular Plaque  ADDITIONAL FINDINGS: No significant stenosis of the bilateral external or common carotid arteries.    IMPRESSION: Patent bilateral carotid endarterectomy sites with no bilateral internal carotid artery stenoses.    Compared to the previous exam:  No significant change noted when compared to the previous exam on 11/11/13.       Assessment: [name] is a 81 y.o. female who is status post left CEA in June of 2010 as well as right CEA in 2005. She has no history of stroke or TIA. Today's carotid Duplex suggests patent bilateral carotid endarterectomy sites with no bilateral internal carotid  artery stenoses.  No significant change noted when compared to the previous exam on 11/11/13.    Plan: Follow-up in 1 year with Carotid Duplex.   I discussed in depth with the patient the nature of atherosclerosis, and emphasized the importance of maximal medical management including strict control of blood pressure, blood glucose, and lipid levels, obtaining regular exercise, and continued cessation of smoking.  The patient is aware that without maximal medical management the underlying atherosclerotic disease process will progress, limiting the benefit of any interventions. The patient was given information about stroke prevention and what symptoms should prompt the patient to seek immediate medical care. Thank you for allowing [place] to participate in this patient's care.  Clemon Chambers, RN, MSN, FNP-C [facility] Office: [facility] Physician: Scot Dock  11/17/2014 11:44 AM

## 2014-11-17 NOTE — Patient Instructions (Signed)
Stroke Prevention Some medical conditions and behaviors are associated with an increased chance of having a stroke. You may prevent a stroke by making healthy choices and managing medical conditions. HOW CAN I REDUCE MY RISK OF HAVING A STROKE?   Stay physically active. Get at least 30 minutes of activity on most or all days.  Do not smoke. It may also be helpful to avoid exposure to secondhand smoke.  Limit alcohol use. Moderate alcohol use is considered to be:  No more than 2 drinks per day for men.  No more than 1 drink per day for nonpregnant women.  Eat healthy foods. This involves:  Eating 5 or more servings of fruits and vegetables a day.  Making dietary changes that address high blood pressure (hypertension), high cholesterol, diabetes, or obesity.  Manage your cholesterol levels.  Making food choices that are high in fiber and low in saturated fat, trans fat, and cholesterol may control cholesterol levels.  Take any prescribed medicines to control cholesterol as directed by your health care provider.  Manage your diabetes.  Controlling your carbohydrate and sugar intake is recommended to manage diabetes.  Take any prescribed medicines to control diabetes as directed by your health care provider.  Control your hypertension.  Making food choices that are low in salt (sodium), saturated fat, trans fat, and cholesterol is recommended to manage hypertension.  Take any prescribed medicines to control hypertension as directed by your health care provider.  Maintain a healthy weight.  Reducing calorie intake and making food choices that are low in sodium, saturated fat, trans fat, and cholesterol are recommended to manage weight.  Stop drug abuse.  Avoid taking birth control pills.  Talk to your health care provider about the risks of taking birth control pills if you are over 35 years old, smoke, get migraines, or have ever had a blood clot.  Get evaluated for sleep  disorders (sleep apnea).  Talk to your health care provider about getting a sleep evaluation if you snore a lot or have excessive sleepiness.  Take medicines only as directed by your health care provider.  For some people, aspirin or blood thinners (anticoagulants) are helpful in reducing the risk of forming abnormal blood clots that can lead to stroke. If you have the irregular heart rhythm of atrial fibrillation, you should be on a blood thinner unless there is a good reason you cannot take them.  Understand all your medicine instructions.  Make sure that other conditions (such as anemia or atherosclerosis) are addressed. SEEK IMMEDIATE MEDICAL CARE IF:   You have sudden weakness or numbness of the face, arm, or leg, especially on one side of the body.  Your face or eyelid droops to one side.  You have sudden confusion.  You have trouble speaking (aphasia) or understanding.  You have sudden trouble seeing in one or both eyes.  You have sudden trouble walking.  You have dizziness.  You have a loss of balance or coordination.  You have a sudden, severe headache with no known cause.  You have new chest pain or an irregular heartbeat. Any of these symptoms may represent a serious problem that is an emergency. Do not wait to see if the symptoms will go away. Get medical help at once. Call your local emergency services (911 in [place] Do not drive yourself to the hospital. Document Released: 05/10/2004 Document Revised: 08/17/2013 Document Reviewed: 10/03/2012 Exit[employer] Patient Information 2015 [employer]. This information is not intended to replace advice given   to you by your health care provider. Make sure you discuss any questions you have with your health care provider.  

## 2014-11-18 ENCOUNTER — Encounter: Payer: Self-pay | Admitting: Family

## 2014-11-18 NOTE — Addendum Note (Signed)
Addended by: Dorthula Rue L on: 11/18/2014 01:32 PM   Modules accepted: Orders

## 2014-11-22 ENCOUNTER — Other Ambulatory Visit: Payer: Self-pay

## 2014-11-22 MED ORDER — BISOPROLOL FUMARATE 10 MG PO TABS: 10.0000 mg | ORAL_TABLET | Freq: Two times a day (BID) | ORAL | Status: DC

## 2014-11-22 NOTE — Telephone Encounter (Signed)
Medication refills

## 2014-12-22 ENCOUNTER — Encounter: Payer: Self-pay | Admitting: Cardiology

## 2014-12-22 ENCOUNTER — Ambulatory Visit (INDEPENDENT_AMBULATORY_CARE_PROVIDER_SITE_OTHER): Payer: Medicare Other | Admitting: Cardiology

## 2014-12-22 ENCOUNTER — Ambulatory Visit (INDEPENDENT_AMBULATORY_CARE_PROVIDER_SITE_OTHER): Payer: Medicare Other | Admitting: *Deleted

## 2014-12-22 VITALS — BP 138/78 | HR 70 | Ht 62.0 in | Wt 184.8 lb

## 2014-12-22 DIAGNOSIS — I10 Essential (primary) hypertension: Secondary | ICD-10-CM | POA: Diagnosis not present

## 2014-12-22 DIAGNOSIS — I4891 Unspecified atrial fibrillation: Secondary | ICD-10-CM

## 2014-12-22 DIAGNOSIS — E785 Hyperlipidemia, unspecified: Secondary | ICD-10-CM

## 2014-12-22 DIAGNOSIS — Z5181 Encounter for therapeutic drug level monitoring: Secondary | ICD-10-CM

## 2014-12-22 DIAGNOSIS — I482 Chronic atrial fibrillation, unspecified: Secondary | ICD-10-CM

## 2014-12-22 DIAGNOSIS — I6523 Occlusion and stenosis of bilateral carotid arteries: Secondary | ICD-10-CM | POA: Diagnosis not present

## 2014-12-22 DIAGNOSIS — I2510 Atherosclerotic heart disease of native coronary artery without angina pectoris: Secondary | ICD-10-CM

## 2014-12-22 LAB — HEPATIC FUNCTION PANEL
ALT: 14 U/L (ref 6–29)
AST: 23 U/L (ref 10–35)
Albumin: 4.1 g/dL (ref 3.6–5.1)
Alkaline Phosphatase: 87 U/L (ref 33–130)
BILIRUBIN DIRECT: 0.2 mg/dL (ref <=0.2)
BILIRUBIN INDIRECT: 0.6 mg/dL (ref 0.2–1.2)
Total Bilirubin: 0.8 mg/dL (ref 0.2–1.2)
Total Protein: 7.6 g/dL (ref 6.1–8.1)

## 2014-12-22 LAB — LIPID PANEL
CHOL/HDL RATIO: 2.7 ratio (ref <=5.0)
CHOLESTEROL: 91 mg/dL — AB (ref 125–200)
HDL: 34 mg/dL — ABNORMAL LOW (ref >=46)
LDL Cholesterol: 41 mg/dL (ref <130)
Triglycerides: 79 mg/dL (ref <150)
VLDL: 16 mg/dL (ref <30)

## 2014-12-22 LAB — POCT INR: INR: 2.5

## 2014-12-22 NOTE — Patient Instructions (Signed)
Your physician wants you to follow-up in:  6 months Dr Ferne Reus will receive a reminder letter in the mail two months in advance. If you don't receive a letter, please call our office to schedule the follow-up appointment.    Your physician recommends that you continue on your current medications as directed. Please refer to the Current Medication list given to you today.      Lab work today: Lipid and Liver function     Thank you for choosing [facility] !

## 2014-12-22 NOTE — Progress Notes (Signed)
Cardiology Office Note  Date: 12/22/2014   ID: [name], DOB 1932-08-21, MRN [id number]  PCP: Purvis Kilts, MD  Primary Cardiologist: Rozann Lesches, MD   Chief Complaint  Patient presents with  . Coronary Artery Disease  . Atrial Fibrillation    History of Present Illness: [name] is an 81 y.o. female last seen in March. She presents for a routine follow-up visit. Remains stable without any significant angina symptoms on medical therapy. We reviewed her medications, she reports no intolerances.  She continues to follow with [facility], had carotid Dopplers in August demonstrating patent bilateral CEA sites.  Lab work from last March is reviewed below. She has not had a follow-up lipid panel or LFTs.  She continues on Coumadin, no bleeding problems, INR was therapeutic today in anticoagulation clinic.   Past Medical History  Diagnosis Date  . Coronary atherosclerosis of native coronary artery     RCA stent in 2003; residual 60% mid Cx and OM2  . Carotid artery disease     RIght CEA 05/2004  . Essential hypertension, benign   . Hyperlipidemia   . Renal artery stenosis     Bilateral renal artery stents 2004  . Tobacco abuse, in remission     40 pack year stopped in 1989  . Atrial fibrillation   . Venous insufficiency (chronic) (peripheral)     Pedal edema  . Chronic anticoagulation   . Obesity     Past Surgical History  Procedure Laterality Date  . Colonoscopy  Never    Declines  . Carotid endarterectomy      Right in 2005, left in 2010    Current Outpatient Prescriptions  Medication Sig Dispense Refill  . atorvastatin (LIPITOR) 80 MG tablet take 1 tablet at bedtime 60 tablet 6  . bisoprolol (ZEBETA) 10 MG tablet Take 1 tablet (10 mg total) by mouth 2 (two) times daily. 60 tablet 6  . furosemide (LASIX) 80 MG tablet Take 80 mg by mouth 2 (two) times daily.      Marland Kitchen losartan (COZAAR) 100 MG tablet Take 1 tablet (100 mg total) by mouth daily.  90 tablet 3  . potassium chloride SA (K-DUR,KLOR-CON) 20 MEQ tablet take 1 tablet by mouth once daily 30 tablet 11  . terazosin (HYTRIN) 5 MG capsule Take 1 capsule (5 mg total) by mouth at bedtime. 90 capsule 3  . warfarin (COUMADIN) 5 MG tablet Take 1 tablet (5 mg total) by mouth daily. 40 tablet 3   No current facility-administered medications for this visit.    Allergies:  Diltiazem and Verapamil   Social History: The patient  reports that she quit smoking about 27 years ago. Her smoking use included Cigarettes. She has never used smokeless tobacco. She reports that she does not drink alcohol or use illicit drugs.   ROS:  Please see the history of present illness. Otherwise, complete review of systems is positive for none.  All other systems are reviewed and negative.   Physical Exam: VS:  BP 138/78 mmHg  Pulse 70  Ht 5\' 2"  (1.575 m)  Wt 184 lb 12.8 oz (83.825 kg)  BMI 33.79 kg/m2  SpO2 97%, BMI Body mass index is 33.79 kg/(m^2).  Wt Readings from Last 3 Encounters:  12/22/14 184 lb 12.8 oz (83.825 kg)  11/17/14 180 lb (81.647 kg)  07/05/14 180 lb (81.647 kg)     Patient is in no distress.  HEENT: Conjunctiva and lids normal, oropharynx clear.  Neck:  Supple, no elevated JVP or carotid bruits, no thyromegaly.  Lungs: Clear to auscultation, nonlabored breathing at rest.  Cardiac: Irregularly irregular, no S3 or significant systolic murmur, no pericardial rub.  Abdomen: Soft, nontender,bowel sounds present.  Extremities: No pitting edema, distal pulses 2+.  Skin: Warm and dry.  Musculoskeletal: No kyphosis.  Neuropsychiatric: Alert and oriented x3, affect grossly appropriate.   ECG: ECG is not ordered today.  Recent Labwork:  07/06/2013: Hemoglobin 13.4, platelets 183, potassium 4.1, BUN 20, creatinine 0.9, AST 20, ALT 15, cholesterol 103, triglycerides 106, HDL 39, LDL 43.   ASSESSMENT AND PLAN:  1. CAD status post RCA stent in 2003 with residual circumflex  system disease being managed medically. She reports no angina symptoms. Continue observation for now.  2. Chronic atrial fibrillation, continue strategy of heart rate control and anticoagulation. She is tolerating Coumadin.  3. Essential hypertension, no changes made to current regimen.  4. Hyperlipidemia, on Lipitor. Follow-up FLP and LFTs.  Current medicines were reviewed at length with the patient today.   Orders Placed This Encounter  Procedures  . Lipid Profile  . Hepatic function panel    Disposition: FU with me in 6 months.   Signed, Satira Sark, MD, [facility] 12/22/2014 10:21 AM    [facility]. [address], [place], [place] [address] Phone: [phone]; Fax: [phone]

## 2015-02-09 ENCOUNTER — Ambulatory Visit (INDEPENDENT_AMBULATORY_CARE_PROVIDER_SITE_OTHER): Payer: Medicare Other | Admitting: *Deleted

## 2015-02-09 DIAGNOSIS — I4891 Unspecified atrial fibrillation: Secondary | ICD-10-CM

## 2015-02-09 DIAGNOSIS — Z5181 Encounter for therapeutic drug level monitoring: Secondary | ICD-10-CM | POA: Diagnosis not present

## 2015-02-09 LAB — POCT INR: INR: 2.8

## 2015-03-04 ENCOUNTER — Telehealth: Payer: Self-pay | Admitting: Cardiology

## 2015-03-04 MED ORDER — WARFARIN SODIUM 5 MG PO TABS: 5.0000 mg | ORAL_TABLET | Freq: Every day | ORAL | Status: DC

## 2015-03-04 NOTE — Telephone Encounter (Signed)
Needs refill on Warfarin sent to [facility] / tg

## 2015-03-23 ENCOUNTER — Ambulatory Visit (INDEPENDENT_AMBULATORY_CARE_PROVIDER_SITE_OTHER): Payer: Medicare Other | Admitting: *Deleted

## 2015-03-23 ENCOUNTER — Telehealth: Payer: Self-pay

## 2015-03-23 DIAGNOSIS — I4891 Unspecified atrial fibrillation: Secondary | ICD-10-CM | POA: Diagnosis not present

## 2015-03-23 DIAGNOSIS — Z5181 Encounter for therapeutic drug level monitoring: Secondary | ICD-10-CM | POA: Diagnosis not present

## 2015-03-23 LAB — POCT INR: INR: 2.7

## 2015-03-23 MED ORDER — POTASSIUM CHLORIDE CRYS ER 20 MEQ PO TBCR: EXTENDED_RELEASE_TABLET | ORAL | Status: DC

## 2015-03-23 NOTE — Telephone Encounter (Signed)
Sent in rx.

## 2015-04-05 ENCOUNTER — Other Ambulatory Visit: Payer: Self-pay | Admitting: *Deleted

## 2015-04-05 MED ORDER — ATORVASTATIN CALCIUM 80 MG PO TABS: 80.0000 mg | ORAL_TABLET | Freq: Every day | ORAL | Status: DC

## 2015-05-04 ENCOUNTER — Ambulatory Visit (INDEPENDENT_AMBULATORY_CARE_PROVIDER_SITE_OTHER): Payer: Medicare Other | Admitting: Pharmacist

## 2015-05-04 DIAGNOSIS — I4891 Unspecified atrial fibrillation: Secondary | ICD-10-CM

## 2015-05-04 DIAGNOSIS — Z5181 Encounter for therapeutic drug level monitoring: Secondary | ICD-10-CM

## 2015-05-04 LAB — POCT INR: INR: 2.3

## 2015-05-17 ENCOUNTER — Other Ambulatory Visit: Payer: Self-pay | Admitting: Cardiology

## 2015-06-06 ENCOUNTER — Telehealth: Payer: Self-pay | Admitting: Cardiology

## 2015-06-06 MED ORDER — FUROSEMIDE 80 MG PO TABS: 80.0000 mg | ORAL_TABLET | Freq: Two times a day (BID) | ORAL | Status: DC

## 2015-06-06 NOTE — Telephone Encounter (Signed)
Patient states that her Furosemide was denied due to needing to see Dr.McDowell. She is scheduled to see him on 3/15. Can this refill be called in now please. / tg

## 2015-06-06 NOTE — Telephone Encounter (Signed)
Refill complete 

## 2015-06-12 ENCOUNTER — Other Ambulatory Visit: Payer: Self-pay | Admitting: Cardiology

## 2015-06-14 ENCOUNTER — Telehealth: Payer: Self-pay | Admitting: Cardiology

## 2015-06-14 NOTE — Telephone Encounter (Signed)
FYI; Patient's daughter has concerns regarding mother. States that mother has had c/o dizziness and SOB. States that mother will not let Dr.McDowell know at appointment nor does she want them to let him know. Patient's daughter just wanted to make sure Dr.McDowell is aware of this for appointment on 06/29/15. / tg

## 2015-06-14 NOTE — Telephone Encounter (Signed)
Will FYI Dr. McDowell  

## 2015-06-15 ENCOUNTER — Ambulatory Visit (INDEPENDENT_AMBULATORY_CARE_PROVIDER_SITE_OTHER): Payer: Medicare Other | Admitting: *Deleted

## 2015-06-15 DIAGNOSIS — I4891 Unspecified atrial fibrillation: Secondary | ICD-10-CM

## 2015-06-15 DIAGNOSIS — Z5181 Encounter for therapeutic drug level monitoring: Secondary | ICD-10-CM | POA: Diagnosis not present

## 2015-06-15 LAB — POCT INR: INR: 3.3

## 2015-06-16 ENCOUNTER — Telehealth: Payer: Self-pay | Admitting: Cardiology

## 2015-06-16 NOTE — Telephone Encounter (Signed)
Patient clarified that she is taking lasix 80 twice a day. This correct dose

## 2015-06-16 NOTE — Telephone Encounter (Signed)
Please call patient regarding Furosemide dose. / tg

## 2015-06-29 ENCOUNTER — Encounter: Payer: Self-pay | Admitting: Cardiology

## 2015-06-29 ENCOUNTER — Ambulatory Visit (INDEPENDENT_AMBULATORY_CARE_PROVIDER_SITE_OTHER): Payer: Medicare Other | Admitting: Cardiology

## 2015-06-29 VITALS — BP 122/60 | HR 82 | Ht 60.0 in | Wt 181.0 lb

## 2015-06-29 DIAGNOSIS — I482 Chronic atrial fibrillation, unspecified: Secondary | ICD-10-CM

## 2015-06-29 DIAGNOSIS — I25119 Atherosclerotic heart disease of native coronary artery with unspecified angina pectoris: Secondary | ICD-10-CM

## 2015-06-29 DIAGNOSIS — R0602 Shortness of breath: Secondary | ICD-10-CM

## 2015-06-29 DIAGNOSIS — I4891 Unspecified atrial fibrillation: Secondary | ICD-10-CM | POA: Diagnosis not present

## 2015-06-29 DIAGNOSIS — I6523 Occlusion and stenosis of bilateral carotid arteries: Secondary | ICD-10-CM | POA: Diagnosis not present

## 2015-06-29 DIAGNOSIS — E782 Mixed hyperlipidemia: Secondary | ICD-10-CM

## 2015-06-29 MED ORDER — FUROSEMIDE 40 MG PO TABS: 40.0000 mg | ORAL_TABLET | Freq: Every day | ORAL | Status: DC

## 2015-06-29 MED ORDER — FUROSEMIDE 40 MG PO TABS: 40.0000 mg | ORAL_TABLET | Freq: Two times a day (BID) | ORAL | Status: DC

## 2015-06-29 NOTE — Patient Instructions (Signed)
Medication Instructions:  DECREASE LASIX 40 MG TWO TIMES DAILY  Labwork: NONE  Testing/Procedures: Your physician has requested that you have a lexiscan myoview. For further information please visit [email or web address]. Please follow instruction sheet, as given.  Your physician has requested that you have an echocardiogram. Echocardiography is a painless test that uses sound waves to create images of your heart. It provides your doctor with information about the size and shape of your heart and how well your heart's chambers and valves are working. This procedure takes approximately one hour. There are no restrictions for this procedure.    Follow-Up: Your physician recommends that you schedule a follow-up appointment in: 3 WEEKS    Any Other Special Instructions Will Be Listed Below (If Applicable).     If you need a refill on your cardiac medications before your next appointment, please call your pharmacy.

## 2015-06-29 NOTE — Progress Notes (Signed)
Cardiology Office Note  Date: 06/29/2015   ID: [name], DOB 20-Oct-1932, MRN [id number]  PCP: Purvis Kilts, MD  Primary Cardiologist: Rozann Lesches, MD   Chief Complaint  Patient presents with  . Coronary Artery Disease  . Atrial Fibrillation    History of Present Illness: [name] is an 82 y.o. female last seen in September 2016. She presents today for a follow-up visit with her daughter. She states that she has not been feeling well, specifically describes exertional fatigue and shortness of breath. This has been getting worse over the last few months. She does not report any specific problems with her medications, no nitroglycerin use. No sense of palpitations except when she gets fatigued while walking and has to rest.  She has a history of remote RCA intervention in 2003 with otherwise residual circumflex/OM disease that was managed medically. She has not had a recent ischemic evaluation. Echocardiogram in 2011 revealed LVEF of 70% with no regional wall motion abnormalities, PASP 42 mmHg.  She continues on Coumadin with follow-up in the anticoagulation clinic. Reports no spontaneous bleeding problems.  I reviewed her ECG today which shows rate-controlled atrial fibrillation with low voltage and R' in V1 and V2.  Past Medical History  Diagnosis Date  . Coronary atherosclerosis of native coronary artery     RCA stent in 2003; residual 60% mid Cx and OM2  . Carotid artery disease ([place])     RIght CEA 05/2004  . Essential hypertension, benign   . Hyperlipidemia   . Renal artery stenosis ([facility])     Bilateral renal artery stents 2004  . Tobacco abuse, in remission     40 pack year stopped in 1989  . Atrial fibrillation ([place])   . Venous insufficiency (chronic) (peripheral)     Pedal edema  . Chronic anticoagulation   . Obesity     Past Surgical History  Procedure Laterality Date  . Colonoscopy  Never    Declines  . Carotid endarterectomy     Right in 2005, left in 2010    Current Outpatient Prescriptions  Medication Sig Dispense Refill  . atorvastatin (LIPITOR) 80 MG tablet Take 1 tablet (80 mg total) by mouth at bedtime. 60 tablet 6  . bisoprolol (ZEBETA) 10 MG tablet take 1 tablet by mouth twice a day 60 tablet 01  . losartan (COZAAR) 100 MG tablet take 1 tablet by mouth once daily 90 tablet 3  . potassium chloride SA (K-DUR,KLOR-CON) 20 MEQ tablet take 1 tablet by mouth once daily 30 tablet 11  . terazosin (HYTRIN) 5 MG capsule Take 1 capsule (5 mg total) by mouth at bedtime. 90 capsule 3  . warfarin (COUMADIN) 5 MG tablet Take 1 tablet (5 mg total) by mouth daily. 40 tablet 3  . furosemide (LASIX) 40 MG tablet Take 1 tablet (40 mg total) by mouth 2 (two) times daily. 180 tablet 3   No current facility-administered medications for this visit.   Allergies:  Diltiazem and Verapamil   Social History: The patient  reports that she quit smoking about 28 years ago. Her smoking use included Cigarettes. She has never used smokeless tobacco. She reports that she does not drink alcohol or use illicit drugs.   Family History: The patient's family history includes Cancer in her mother; Hyperlipidemia in her daughter; Hypertension in her daughter, daughter, and son; Lung cancer in her mother.   ROS:  Please see the history of present illness. Otherwise, complete review of  systems is positive for leg fatigue and arthritis symptoms.  All other systems are reviewed and negative.   Physical Exam: VS:  BP 122/60 mmHg  Pulse 82  Ht 5' (1.524 m)  Wt 181 lb (82.101 kg)  BMI 35.35 kg/m2  SpO2 99%, BMI Body mass index is 35.35 kg/(m^2).  Wt Readings from Last 3 Encounters:  06/29/15 181 lb (82.101 kg)  12/22/14 184 lb 12.8 oz (83.825 kg)  11/17/14 180 lb (81.647 kg)    Pleasant elderly woman, no distress.  HEENT: Conjunctiva and lids normal, oropharynx clear.  Neck: Supple, no elevated JVP or carotid bruits, no thyromegaly.  Lungs:  Clear to auscultation, nonlabored breathing at rest.  Cardiac: Irregularly irregular, no S3 or significant systolic murmur, no pericardial rub.  Abdomen: Soft, nontender,bowel sounds present.  Extremities: No pitting edema, distal pulses 2+.  Skin: Warm and dry.  Musculoskeletal: No kyphosis.  Neuropsychiatric: Alert and oriented x3, affect grossly appropriate.  ECG: I personally reviewed the prior tracing from 07/05/2014 which showed rate-controlled atrial fibrillation.  Recent Labwork: 12/22/2014: ALT 14; AST 23     Component Value Date/Time   CHOL 91* 12/22/2014 1035   TRIG 79 12/22/2014 1035   HDL 34* 12/22/2014 1035   CHOLHDL 2.7 12/22/2014 1035   VLDL 16 12/22/2014 1035   LDLCALC 41 12/22/2014 1035    Other Studies Reviewed Today:  Carotid Dopplers 11/17/2014: Patent bilateral ICA CEA sites  Assessment and Plan:  1. Progressive exertional fatigue and shortness of breath over the last few months. She was concerned about the dose of her Lasix and wanted to try to cut it back, feeling that it might be making her weak, however we also discussed the possibility that her symptoms could be a sign of progressive CAD. We will go ahead and cut back Lasix to 40 mg twice daily, also follow-up with an echocardiogram to reassess cardiac structure and function. A Lexiscan Cardiolite will be arranged to further assess for ischemia.  2. CAD status post RCA intervention in 2003 with moderate residual circumflex/OM disease that was managed medically. She is on a reasonable medical regimen, although certainly possible that she has had progressive disease over the last several years. As noted above, follow-up ischemic testing is being arranged.  3. Chronic atrial fibrillation, heart rate control has been adequate on present regimen and she continues on Coumadin.  4. Carotid artery disease status post bilateral CEAs, patent by Dopplers in August 2016 and followed by [facility].  5. Hyperlipidemia on  statin therapy, LDL aggressively controlled as of last year.  Current medicines were reviewed with the patient today.   Orders Placed This Encounter  Procedures  . NM Myocar Multi W/Spect W/Wall Motion / EF  . EKG 12-Lead  . Echocardiogram    Disposition: FU with me in 3 weeks.   Signed, Satira Sark, MD, [facility] 06/29/2015 1:23 PM    [facility] [address] [place], [place] [address] Phone: [phone]; Fax: [phone]

## 2015-07-06 ENCOUNTER — Ambulatory Visit (HOSPITAL_COMMUNITY)
Admission: RE | Admit: 2015-07-06 | Discharge: 2015-07-06 | Disposition: A | Discharge status: Home / Self Care | Payer: Medicare Other | Source: Ambulatory Visit | Attending: Cardiovascular Disease | Admitting: Cardiovascular Disease

## 2015-07-06 ENCOUNTER — Encounter: Payer: Self-pay | Admitting: Cardiovascular Disease

## 2015-07-06 ENCOUNTER — Ambulatory Visit (INDEPENDENT_AMBULATORY_CARE_PROVIDER_SITE_OTHER): Payer: Medicare Other | Admitting: Cardiovascular Disease

## 2015-07-06 VITALS — BP 138/58 | HR 71 | Ht 62.0 in | Wt 180.0 lb

## 2015-07-06 DIAGNOSIS — I4891 Unspecified atrial fibrillation: Secondary | ICD-10-CM | POA: Diagnosis not present

## 2015-07-06 DIAGNOSIS — R5383 Other fatigue: Secondary | ICD-10-CM | POA: Diagnosis not present

## 2015-07-06 DIAGNOSIS — I6523 Occlusion and stenosis of bilateral carotid arteries: Secondary | ICD-10-CM

## 2015-07-06 DIAGNOSIS — R42 Dizziness and giddiness: Secondary | ICD-10-CM | POA: Diagnosis not present

## 2015-07-06 DIAGNOSIS — Z6834 Body mass index (BMI) 34.0-34.9, adult: Secondary | ICD-10-CM | POA: Diagnosis not present

## 2015-07-06 DIAGNOSIS — Z1389 Encounter for screening for other disorder: Secondary | ICD-10-CM | POA: Diagnosis not present

## 2015-07-06 NOTE — Patient Instructions (Signed)
Your physician recommends that you schedule a follow-up appointment with Dr. [name]  Your physician recommends that you continue on your current medications as directed. Please refer to the Current Medication list given to you today.  Your physician has recommended that you wear a 48 hour holter monitor. Holter monitors are medical devices that record the heart's electrical activity. Doctors most often use these monitors to diagnose arrhythmias. Arrhythmias are problems with the speed or rhythm of the heartbeat. The monitor is a small, portable device. You can wear one while you do your normal daily activities. This is usually used to diagnose what is causing palpitations/syncope (passing out).   If you need a refill on your cardiac medications before your next appointment, please call your pharmacy.  Thank you for choosing [facility]!

## 2015-07-06 NOTE — Progress Notes (Signed)
Patient ID: [name], female   DOB: 1932-05-21, 82 y.o.   MRN: [id number]    Cardiology Office Note  Date: 07/06/2015   ID: [name], DOB [birth year], MRN [id number]  PCP: Purvis Kilts, MD  Primary Cardiologist: Jenkins Rouge, MD   No chief complaint on file.   History of Present Illness: [name] is an 82 y.o. female last seen in September 2016. She presents today for a follow-up visit with her daughter. She states that she has not been feeling well, specifically describes exertional fatigue and shortness of breath. This has been getting worse over the last few months. She does not report any specific problems with her medications, no nitroglycerin use. No sense of palpitations except when she gets fatigued while walking and has to rest.  She has a history of remote RCA intervention in 2003 with otherwise residual circumflex/OM disease that was managed medically. She has not had a recent ischemic evaluation. Echocardiogram in 2011 revealed LVEF of 70% with no regional wall motion abnormalities, PASP 42 mmHg.  She continues on Coumadin with follow-up in the anticoagulation clinic. Reports no spontaneous bleeding problems.  I reviewed her ECG 06/29/15  which shows rate-controlled atrial fibrillation with low voltage and R' in V1 and V2.  Seen by [name] last week for fatigue Added on today for same with aching all over and dizzyness.  She is not postural in office today.  She appears to have viral illness or flu She does note more rapid HR on standing  Has not had f/u echo or myovue ordered by [name]  No fever No vertigo nausea or vomiting   Past Medical History  Diagnosis Date  . Coronary atherosclerosis of native coronary artery     RCA stent in 2003; residual 60% mid Cx and OM2  . Carotid artery disease ([place])     RIght CEA 05/2004  . Essential hypertension, benign   . Hyperlipidemia   . Renal artery stenosis ([facility])     Bilateral renal artery  stents 2004  . Tobacco abuse, in remission     40 pack year stopped in 1989  . Atrial fibrillation ([place])   . Venous insufficiency (chronic) (peripheral)     Pedal edema  . Chronic anticoagulation   . Obesity     Past Surgical History  Procedure Laterality Date  . Colonoscopy  Never    Declines  . Carotid endarterectomy      Right in 2005, left in 2010    Current Outpatient Prescriptions  Medication Sig Dispense Refill  . atorvastatin (LIPITOR) 80 MG tablet Take 1 tablet (80 mg total) by mouth at bedtime. 60 tablet 6  . bisoprolol (ZEBETA) 10 MG tablet take 1 tablet by mouth twice a day 60 tablet 01  . furosemide (LASIX) 40 MG tablet Take 1 tablet (40 mg total) by mouth 2 (two) times daily. 180 tablet 3  . losartan (COZAAR) 100 MG tablet take 1 tablet by mouth once daily 90 tablet 3  . potassium chloride SA (K-DUR,KLOR-CON) 20 MEQ tablet take 1 tablet by mouth once daily 30 tablet 11  . terazosin (HYTRIN) 5 MG capsule Take 1 capsule (5 mg total) by mouth at bedtime. 90 capsule 3  . warfarin (COUMADIN) 5 MG tablet Take 1 tablet (5 mg total) by mouth daily. 40 tablet 3   No current facility-administered medications for this visit.   Allergies:  Diltiazem and Verapamil   Social History: The patient  reports  that she quit smoking about 28 years ago. Her smoking use included Cigarettes. She has never used smokeless tobacco. She reports that she does not drink alcohol or use illicit drugs.   Family History: The patient's family history includes Cancer in her mother; Hyperlipidemia in her daughter; Hypertension in her daughter, daughter, and son; Lung cancer in her mother.   ROS:  Please see the history of present illness. Otherwise, complete review of systems is positive for leg fatigue and arthritis symptoms.  All other systems are reviewed and negative.   Physical Exam: VS:  BP 138/58 mmHg  Pulse 71  Ht 5\' 2"  (1.575 m)  Wt 81.647 kg (180 lb)  BMI 32.91 kg/m2  SpO2 95%, BMI Body  mass index is 32.91 kg/(m^2).  Wt Readings from Last 3 Encounters:  07/06/15 81.647 kg (180 lb)  06/29/15 82.101 kg (181 lb)  12/22/14 83.825 kg (184 lb 12.8 oz)    Pleasant elderly woman, no distress.  HEENT: Conjunctiva and lids normal, oropharynx clear.  Neck: Supple, no elevated JVP bilateral  bruits, no thyromegaly.  Lungs: Clear to auscultation, nonlabored breathing at rest.  Cardiac: Irregularly irregular, no S3 or significant systolic murmur, no pericardial rub.  Abdomen: Soft, nontender,bowel sounds present.  Extremities: No pitting edema, distal pulses 2+.  Skin: Warm and dry.  Musculoskeletal: No kyphosis.  Neuropsychiatric: Alert and oriented x3, affect grossly appropriate.  ECG: I personally reviewed the prior tracing from 07/05/2014 which showed rate-controlled atrial fibrillation.  Recent Labwork: 12/22/2014: ALT 14; AST 23     Component Value Date/Time   CHOL 91* 12/22/2014 1035   TRIG 79 12/22/2014 1035   HDL 34* 12/22/2014 1035   CHOLHDL 2.7 12/22/2014 1035   VLDL 16 12/22/2014 1035   LDLCALC 41 12/22/2014 1035    Other Studies Reviewed Today:  Carotid Dopplers 11/17/2014: Patent bilateral ICA CEA sites  Assessment and Plan:  1.Fatigue / Dizzyness:  This is not cardiac related lasix decreased last visit with SM  Not postural will get 48 hr holter as afib may not be well rate controlled F/U Dr Armandina Gemma likely flu/ URI  2. CAD status post RCA intervention in 2003 with moderate residual circumflex/OM disease that was managed medically. She is on a reasonable medical regimen, although certainly possible that she has had progressive disease over the last several years. As noted above, follow-up ischemic testing is being arranged.next Wendsday   3. Chronic atrial fibrillation, holter for rate evaluation on coumadin   4. Carotid artery disease status post bilateral CEAs, patent by Dopplers in August 2016 and followed by [facility]. Bilateral bruits on exam   5.  Hyperlipidemia on statin therapy, LDL aggressively controlled as of last year.  Current medicines were reviewed with the patient today.   Orders Placed This Encounter  Procedures  . Holter monitor - 48 hour    Disposition: FU with SM in 2 weeks  F/U with testing echo / myovue next week Holter this week Has appt with Dr Zachary George PA today    Signed, Satira Sark, MD, [facility] 07/06/2015 9:27 AM    [facility] [place]. [address], [place], [place] [address] Phone: [phone]; Fax: [phone]

## 2015-07-07 ENCOUNTER — Ambulatory Visit (HOSPITAL_COMMUNITY)
Admission: RE | Admit: 2015-07-07 | Discharge: 2015-07-07 | Disposition: A | Discharge status: Home / Self Care | Payer: Medicare Other | Source: Ambulatory Visit | Attending: Cardiology | Admitting: Cardiology

## 2015-07-07 DIAGNOSIS — Z1389 Encounter for screening for other disorder: Secondary | ICD-10-CM | POA: Diagnosis not present

## 2015-07-07 DIAGNOSIS — I10 Essential (primary) hypertension: Secondary | ICD-10-CM | POA: Diagnosis not present

## 2015-07-07 DIAGNOSIS — E785 Hyperlipidemia, unspecified: Secondary | ICD-10-CM | POA: Insufficient documentation

## 2015-07-07 DIAGNOSIS — R5383 Other fatigue: Secondary | ICD-10-CM | POA: Diagnosis not present

## 2015-07-07 DIAGNOSIS — R0602 Shortness of breath: Secondary | ICD-10-CM | POA: Diagnosis not present

## 2015-07-07 DIAGNOSIS — Z7901 Long term (current) use of anticoagulants: Secondary | ICD-10-CM | POA: Diagnosis not present

## 2015-07-07 DIAGNOSIS — I081 Rheumatic disorders of both mitral and tricuspid valves: Secondary | ICD-10-CM | POA: Insufficient documentation

## 2015-07-07 DIAGNOSIS — E538 Deficiency of other specified B group vitamins: Secondary | ICD-10-CM | POA: Diagnosis not present

## 2015-07-07 DIAGNOSIS — E559 Vitamin D deficiency, unspecified: Secondary | ICD-10-CM | POA: Diagnosis not present

## 2015-07-07 DIAGNOSIS — I4891 Unspecified atrial fibrillation: Secondary | ICD-10-CM | POA: Diagnosis not present

## 2015-07-08 DIAGNOSIS — R7309 Other abnormal glucose: Secondary | ICD-10-CM | POA: Diagnosis not present

## 2015-07-08 DIAGNOSIS — E782 Mixed hyperlipidemia: Secondary | ICD-10-CM | POA: Diagnosis not present

## 2015-07-08 DIAGNOSIS — I2510 Atherosclerotic heart disease of native coronary artery without angina pectoris: Secondary | ICD-10-CM | POA: Diagnosis not present

## 2015-07-08 DIAGNOSIS — I10 Essential (primary) hypertension: Secondary | ICD-10-CM | POA: Diagnosis not present

## 2015-07-11 ENCOUNTER — Encounter (HOSPITAL_COMMUNITY): Payer: Self-pay | Admitting: Emergency Medicine

## 2015-07-11 ENCOUNTER — Observation Stay (HOSPITAL_COMMUNITY)
Admission: EM | Admit: 2015-07-11 | Discharge: 2015-07-13 | Disposition: A | Discharge status: Home / Self Care | Payer: Medicare Other | Attending: Internal Medicine | Admitting: Internal Medicine

## 2015-07-11 DIAGNOSIS — D649 Anemia, unspecified: Secondary | ICD-10-CM | POA: Diagnosis not present

## 2015-07-11 DIAGNOSIS — E669 Obesity, unspecified: Secondary | ICD-10-CM | POA: Insufficient documentation

## 2015-07-11 DIAGNOSIS — Z7901 Long term (current) use of anticoagulants: Secondary | ICD-10-CM | POA: Insufficient documentation

## 2015-07-11 DIAGNOSIS — Z87891 Personal history of nicotine dependence: Secondary | ICD-10-CM | POA: Insufficient documentation

## 2015-07-11 DIAGNOSIS — I10 Essential (primary) hypertension: Secondary | ICD-10-CM | POA: Diagnosis not present

## 2015-07-11 DIAGNOSIS — D509 Iron deficiency anemia, unspecified: Secondary | ICD-10-CM | POA: Diagnosis present

## 2015-07-11 DIAGNOSIS — R5383 Other fatigue: Secondary | ICD-10-CM | POA: Diagnosis present

## 2015-07-11 DIAGNOSIS — I4891 Unspecified atrial fibrillation: Secondary | ICD-10-CM | POA: Insufficient documentation

## 2015-07-11 DIAGNOSIS — E785 Hyperlipidemia, unspecified: Secondary | ICD-10-CM | POA: Insufficient documentation

## 2015-07-11 DIAGNOSIS — I2510 Atherosclerotic heart disease of native coronary artery without angina pectoris: Secondary | ICD-10-CM | POA: Diagnosis not present

## 2015-07-11 DIAGNOSIS — Z79899 Other long term (current) drug therapy: Secondary | ICD-10-CM | POA: Insufficient documentation

## 2015-07-11 DIAGNOSIS — R7989 Other specified abnormal findings of blood chemistry: Secondary | ICD-10-CM | POA: Diagnosis present

## 2015-07-11 DIAGNOSIS — I482 Chronic atrial fibrillation, unspecified: Secondary | ICD-10-CM | POA: Diagnosis present

## 2015-07-11 LAB — CBC WITH DIFFERENTIAL/PLATELET
BASOS ABS: 0 10*3/uL (ref 0.0–0.1)
Basophils Relative: 0 %
EOS ABS: 0.1 10*3/uL (ref 0.0–0.7)
EOS PCT: 1 %
HCT: 26 % — ABNORMAL LOW (ref 36.0–46.0)
Hemoglobin: 7.5 g/dL — ABNORMAL LOW (ref 12.0–15.0)
Lymphocytes Relative: 7 %
Lymphs Abs: 0.7 10*3/uL (ref 0.7–4.0)
MCH: 20.5 pg — ABNORMAL LOW (ref 26.0–34.0)
MCHC: 28.8 g/dL — ABNORMAL LOW (ref 30.0–36.0)
MCV: 71 fL — ABNORMAL LOW (ref 78.0–100.0)
Monocytes Absolute: 1.1 10*3/uL — ABNORMAL HIGH (ref 0.1–1.0)
Monocytes Relative: 11 %
Neutro Abs: 8 10*3/uL — ABNORMAL HIGH (ref 1.7–7.7)
Neutrophils Relative %: 81 %
PLATELETS: 201 10*3/uL (ref 150–400)
RBC: 3.66 MIL/uL — AB (ref 3.87–5.11)
RDW: 16.4 % — ABNORMAL HIGH (ref 11.5–15.5)
WBC: 9.9 10*3/uL (ref 4.0–10.5)

## 2015-07-11 LAB — COMPREHENSIVE METABOLIC PANEL
ALBUMIN: 3.6 g/dL (ref 3.5–5.0)
ALT: 13 U/L — AB (ref 14–54)
AST: 21 U/L (ref 15–41)
Alkaline Phosphatase: 80 U/L (ref 38–126)
Anion gap: 7 (ref 5–15)
BUN: 23 mg/dL — AB (ref 6–20)
CHLORIDE: 104 mmol/L (ref 101–111)
CO2: 26 mmol/L (ref 22–32)
CREATININE: 1.33 mg/dL — AB (ref 0.44–1.00)
Calcium: 8.7 mg/dL — ABNORMAL LOW (ref 8.9–10.3)
GFR calc non Af Amer: 37 mL/min — ABNORMAL LOW (ref >60)
GFR, EST AFRICAN AMERICAN: 43 mL/min — AB (ref >60)
Glucose, Bld: 133 mg/dL — ABNORMAL HIGH (ref 65–99)
Potassium: 4 mmol/L (ref 3.5–5.1)
SODIUM: 137 mmol/L (ref 135–145)
Total Bilirubin: 0.9 mg/dL (ref 0.3–1.2)
Total Protein: 7.2 g/dL (ref 6.5–8.1)

## 2015-07-11 LAB — PROTIME-INR
INR: 3.62 — ABNORMAL HIGH (ref 0.00–1.49)
Prothrombin Time: 35.3 seconds — ABNORMAL HIGH (ref 11.6–15.2)

## 2015-07-11 LAB — RETICULOCYTES
RBC.: 3.7 MIL/uL — ABNORMAL LOW (ref 3.87–5.11)
RETIC COUNT ABSOLUTE: 66.6 10*3/uL (ref 19.0–186.0)
RETIC CT PCT: 1.8 % (ref 0.4–3.1)

## 2015-07-11 LAB — ABO/RH: ABO/RH(D): O POS

## 2015-07-11 LAB — PREPARE RBC (CROSSMATCH)

## 2015-07-11 LAB — TROPONIN I

## 2015-07-11 MED ORDER — ONDANSETRON HCL 4 MG/2ML IJ SOLN: 4.0000 mg | Freq: Four times a day (QID) | INTRAMUSCULAR | Status: DC | PRN

## 2015-07-11 MED ORDER — ACETAMINOPHEN 650 MG RE SUPP: 650.0000 mg | Freq: Four times a day (QID) | RECTAL | Status: DC | PRN

## 2015-07-11 MED ORDER — ATORVASTATIN CALCIUM 40 MG PO TABS
80.0000 mg | ORAL_TABLET | Freq: Every day | ORAL | Status: DC
  Administered 2015-07-12 (×2): 80 mg via ORAL
  Filled 2015-07-11: qty 1
  Filled 2015-07-11: qty 2

## 2015-07-11 MED ORDER — ACETAMINOPHEN 325 MG PO TABS: 650.0000 mg | ORAL_TABLET | Freq: Four times a day (QID) | ORAL | Status: DC | PRN

## 2015-07-11 MED ORDER — SODIUM CHLORIDE 0.9 % IV SOLN: 250.0000 mL | INTRAVENOUS | Status: DC | PRN

## 2015-07-11 MED ORDER — TERAZOSIN HCL 5 MG PO CAPS
5.0000 mg | ORAL_CAPSULE | Freq: Every day | ORAL | Status: DC
  Administered 2015-07-12 (×2): 5 mg via ORAL
  Filled 2015-07-11: qty 1

## 2015-07-11 MED ORDER — SODIUM CHLORIDE 0.9% FLUSH
3.0000 mL | Freq: Two times a day (BID) | INTRAVENOUS | Status: DC
  Administered 2015-07-12 – 2015-07-13 (×4): 3 mL via INTRAVENOUS

## 2015-07-11 MED ORDER — ZOLPIDEM TARTRATE 5 MG PO TABS
5.0000 mg | ORAL_TABLET | Freq: Every evening | ORAL | Status: DC | PRN
  Administered 2015-07-12: 5 mg via ORAL
  Filled 2015-07-11: qty 1

## 2015-07-11 MED ORDER — BISOPROLOL FUMARATE 5 MG PO TABS
10.0000 mg | ORAL_TABLET | Freq: Two times a day (BID) | ORAL | Status: DC
  Administered 2015-07-12 – 2015-07-13 (×4): 10 mg via ORAL
  Filled 2015-07-11: qty 2
  Filled 2015-07-11: qty 1
  Filled 2015-07-11 (×3): qty 2
  Filled 2015-07-11 (×2): qty 1
  Filled 2015-07-11: qty 2

## 2015-07-11 MED ORDER — SODIUM CHLORIDE 0.9% FLUSH: 3.0000 mL | INTRAVENOUS | Status: DC | PRN

## 2015-07-11 MED ORDER — SODIUM CHLORIDE 0.9 % IV SOLN
Freq: Once | INTRAVENOUS | Status: AC
  Administered 2015-07-11: 21:00:00 via INTRAVENOUS

## 2015-07-11 MED ORDER — ONDANSETRON HCL 4 MG PO TABS: 4.0000 mg | ORAL_TABLET | Freq: Four times a day (QID) | ORAL | Status: DC | PRN

## 2015-07-11 MED ORDER — FUROSEMIDE 10 MG/ML IJ SOLN
60.0000 mg | Freq: Once | INTRAMUSCULAR | Status: AC
  Administered 2015-07-12: 60 mg via INTRAVENOUS
  Filled 2015-07-11: qty 6

## 2015-07-11 MED ORDER — POTASSIUM CHLORIDE CRYS ER 20 MEQ PO TBCR
20.0000 meq | EXTENDED_RELEASE_TABLET | Freq: Every day | ORAL | Status: DC
  Administered 2015-07-12 – 2015-07-13 (×2): 20 meq via ORAL
  Filled 2015-07-11: qty 1
  Filled 2015-07-11: qty 2

## 2015-07-11 MED ORDER — LOSARTAN POTASSIUM 50 MG PO TABS
100.0000 mg | ORAL_TABLET | Freq: Every day | ORAL | Status: DC
  Administered 2015-07-12: 100 mg via ORAL
  Filled 2015-07-11: qty 2

## 2015-07-11 MED ORDER — SODIUM CHLORIDE 0.9 % IV SOLN: INTRAVENOUS | Status: DC

## 2015-07-11 MED ORDER — FUROSEMIDE 40 MG PO TABS: 40.0000 mg | ORAL_TABLET | Freq: Two times a day (BID) | ORAL | Status: DC

## 2015-07-11 NOTE — ED Provider Notes (Signed)
CSN: [id number]     Arrival date & time 07/11/15  1541 History   First MD Initiated Contact with Patient 07/11/15 1925     Chief Complaint  Patient presents with  . Abnormal Lab     (Consider location/radiation/quality/duration/timing/severity/associated sxs/prior Treatment) HPI Comments: Patient from PCPs office with low hemoglobin. He reportedly has had dizziness and lightheadedness for the past 10 days. She underwent echocardiogram which was normal. Called by PCP today for anemia. Denies any black or bloody stools. Denies any nausea or vomiting. Denies any chest pain or shortness of breath. She is on Coumadin for history of atrial fibrillation. She's never had a colonoscopy or a blood transfusion. Denies any abdominal pain, back or chest pain. She endorses general lightheadedness and dizziness with standing.  The history is provided by the patient and a relative.    Past Medical History  Diagnosis Date  . Coronary atherosclerosis of native coronary artery     RCA stent in 2003; residual 60% mid Cx and OM2  . Carotid artery disease ([place])     RIght CEA 05/2004  . Essential hypertension, benign   . Hyperlipidemia   . Renal artery stenosis ([facility])     Bilateral renal artery stents 2004  . Tobacco abuse, in remission     40 pack year stopped in 1989  . Atrial fibrillation ([place])   . Venous insufficiency (chronic) (peripheral)     Pedal edema  . Chronic anticoagulation   . Obesity    Past Surgical History  Procedure Laterality Date  . Colonoscopy  Never    Declines  . Carotid endarterectomy      Right in 2005, left in 2010  . Coronary angioplasty     Family History  Problem Relation Age of Onset  . Lung cancer Mother   . Cancer Mother   . Hypertension Daughter   . Hyperlipidemia Daughter   . Hypertension Son   . Hypertension Daughter    Social History  Substance Use Topics  . Smoking status: Former Smoker    Types: Cigarettes    Quit date: 04/30/1987  . Smokeless  tobacco: Never Used  . Alcohol Use: No   OB History    No data available     Review of Systems  Constitutional: Positive for activity change, appetite change and fatigue. Negative for fever.  HENT: Negative for congestion.   Respiratory: Negative for cough, chest tightness and shortness of breath.   Cardiovascular: Negative for chest pain.  Gastrointestinal: Negative for nausea, vomiting and abdominal pain.  Genitourinary: Negative for dysuria, hematuria, vaginal bleeding and vaginal discharge.  Musculoskeletal: Negative for myalgias and arthralgias.  Skin: Positive for pallor. Negative for rash.  Neurological: Positive for dizziness, weakness and light-headedness. Negative for headaches.  A complete 10 system review of systems was obtained and all systems are negative except as noted in the HPI and PMH.      Allergies  Diltiazem and Verapamil  Home Medications   Prior to Admission medications   Medication Sig Start Date End Date Taking? Authorizing Provider  atorvastatin (LIPITOR) 80 MG tablet Take 1 tablet (80 mg total) by mouth at bedtime. 04/05/15  Yes Satira Sark, MD  bisoprolol (ZEBETA) 10 MG tablet take 1 tablet by mouth twice a day 06/13/15  Yes Satira Sark, MD  furosemide (LASIX) 40 MG tablet Take 1 tablet (40 mg total) by mouth 2 (two) times daily. 06/29/15  Yes Satira Sark, MD  losartan (COZAAR) 100 MG tablet  take 1 tablet by mouth once daily 05/17/15  Yes Satira Sark, MD  potassium chloride SA (K-DUR,KLOR-CON) 20 MEQ tablet take 1 tablet by mouth once daily 03/23/15  Yes Satira Sark, MD  terazosin (HYTRIN) 5 MG capsule Take 1 capsule (5 mg total) by mouth at bedtime. 09/20/14  Yes Satira Sark, MD  Vitamin D, Ergocalciferol, (DRISDOL) 50000 units CAPS capsule Take 50,000 Units by mouth every 7 (seven) days.   Yes Historical Provider, MD  warfarin (COUMADIN) 5 MG tablet Take 1 tablet (5 mg total) by mouth daily. Patient taking differently:  Take 10 mg by mouth daily.  03/04/15  Yes Satira Sark, MD   BP 138/56 mmHg  Pulse 87  Temp(Src) 97.8 F (36.6 C) (Oral)  Resp 18  Ht 5\' 3"  (1.6 m)  Wt 180 lb (81.647 kg)  BMI 31.89 kg/m2  SpO2 94% Physical Exam  Constitutional: She is oriented to person, place, and time. She appears well-developed and well-nourished. No distress.  Pale appearing  HENT:  Head: Normocephalic and atraumatic.  Mouth/Throat: Oropharynx is clear and moist. No oropharyngeal exudate.  Eyes: Conjunctivae and EOM are normal. Pupils are equal, round, and reactive to light.  Neck: Normal range of motion. Neck supple.  No meningismus.  Cardiovascular: Normal rate and intact distal pulses.   Murmur heard. Irregular rhythm  Pulmonary/Chest: Effort normal and breath sounds normal. No respiratory distress.  Abdominal: Soft. There is no tenderness. There is no rebound and no guarding.  Genitourinary: Guaiac positive stool.  Chaperone present, no gross blood, brown stool, Hemoccult negative  Musculoskeletal: Normal range of motion. She exhibits no edema or tenderness.  Neurological: She is alert and oriented to person, place, and time. No cranial nerve deficit. She exhibits normal muscle tone. Coordination normal.  No ataxia on finger to nose bilaterally. No pronator drift. 5/5 strength throughout. CN 2-12 intact.Equal grip strength. Sensation intact.   Skin: Skin is warm.  Psychiatric: She has a normal mood and affect. Her behavior is normal.  Nursing note and vitals reviewed.   ED Course  Procedures (including critical care time) Labs Review Labs Reviewed  CBC WITH DIFFERENTIAL/PLATELET - Abnormal; Notable for the following:    RBC 3.66 (*)    Hemoglobin 7.5 (*)    HCT 26.0 (*)    MCV 71.0 (*)    MCH 20.5 (*)    MCHC 28.8 (*)    RDW 16.4 (*)    Neutro Abs 8.0 (*)    Monocytes Absolute 1.1 (*)    All other components within normal limits  COMPREHENSIVE METABOLIC PANEL - Abnormal; Notable for the  following:    Glucose, Bld 133 (*)    BUN 23 (*)    Creatinine, Ser 1.33 (*)    Calcium 8.7 (*)    ALT 13 (*)    GFR calc non Af Amer 37 (*)    GFR calc Af Amer 43 (*)    All other components within normal limits  PROTIME-INR - Abnormal; Notable for the following:    Prothrombin Time 35.3 (*)    INR 3.62 (*)    All other components within normal limits  RETICULOCYTES - Abnormal; Notable for the following:    RBC. 3.70 (*)    All other components within normal limits  TROPONIN I  VITAMIN B12  FOLATE  IRON AND TIBC  FERRITIN  CBC  PROTIME-INR  POC OCCULT BLOOD, ED  TYPE AND SCREEN  PREPARE RBC (CROSSMATCH)  ABO/RH  Imaging Review No results found. I have personally reviewed and evaluated these images and lab results as part of my medical decision-making.   EKG Interpretation   Date/Time:  Monday July 11 2015 20:15:03 EDT Ventricular Rate:  95 PR Interval:    QRS Duration: 95 QT Interval:  312 QTC Calculation: 392 R Axis:   58 Text Interpretation:  Atrial fibrillation RSR' in V1 or V2, right VCD or  RVH Nonspecific repol abnormality, lateral leads lateral ST depressions  Confirmed by Wyvonnia Dusky  MD, [place] [phone]) on 07/11/2015 8:23:59 PM      MDM   Final diagnoses:  Symptomatic anemia   symptomatic anemia. Hemoccult is negative. INR is 3.6. Hemoglobin is 7.5, was 13 2 years ago.  No chest pain or abdominal pain. EKG with lateral ST depressions, new. Troponin negative. No obvious GI or other source. MCV low. Anemia panel sent.  Will transfuse 2 units. No chest or abdominal pain.  Hold coumadin.  Vitals stable. Admission d/w Dr. Jonnie Finner.  CRITICAL CARE Performed by: Ezequiel Essex Total critical care time: 35 minutes Critical care time was exclusive of separately billable procedures and treating other patients. Critical care was necessary to treat or prevent imminent or life-threatening deterioration. Critical care was time spent personally by me on  the following activities: development of treatment plan with patient and/or surrogate as well as nursing, discussions with consultants, evaluation of patient's response to treatment, examination of patient, obtaining history from patient or surrogate, ordering and performing treatments and interventions, ordering and review of laboratory studies, ordering and review of radiographic studies, pulse oximetry and re-evaluation of patient's condition.    Ezequiel Essex, MD 07/11/15 [phone]

## 2015-07-11 NOTE — H&P (Addendum)
[facility] History and Physical  [name] [id number] DOB: 23-Jun-1932 DOA: 07/11/2015  Referring physician: Dr Wyvonnia Dusky PCP: Purvis Kilts, MD   Chief Complaint: Fatigue  HPI: [name] is a 82 y.o. female with hx of HTN, afib and HL sent to ED from PCP's office because of low Hb and recent fatigue.  In ED Hb 7.5, 2u prbc's ordered and infusing. Asked to see for OBS admission.    Patient reports fatigue/ tired/ dizzy, no syncope, no bloody stool or other bleeding.  No nsaids.  On coumadin and INR tonight is 3.6.  No CP, no sob or orthopnea.  No abd pain,n/v/d, no fevers, chills or sweats.  No recent illness. Saw cardiology last week and had ECHO done.  Supposed to have stress test soon.    In ED the ED physician said the rectal showed guiac negative stool.     Home meds :  Lipitor, bisoprolol, losartan, KCl, terazosin, vit D, warfarin    ROS  denies CP  no joint pain   no HA  no blurry vision  no rash  no diarrhea  no nausea/ vomiting  no dysuria  no difficulty voiding  no change in urine color    Where does patient live home Can patient participate in ADLs? yes  Past Medical History  Past Medical History  Diagnosis Date  . Coronary atherosclerosis of native coronary artery     RCA stent in 2003; residual 60% mid Cx and OM2  . Carotid artery disease ([place])     RIght CEA 05/2004  . Essential hypertension, benign   . Hyperlipidemia   . Renal artery stenosis ([facility])     Bilateral renal artery stents 2004  . Tobacco abuse, in remission     40 pack year stopped in 1989  . Atrial fibrillation ([place])   . Venous insufficiency (chronic) (peripheral)     Pedal edema  . Chronic anticoagulation   . Obesity    Past Surgical History  Past Surgical History  Procedure Laterality Date  . Colonoscopy  Never    Declines  . Carotid endarterectomy      Right in 2005, left in 2010   Family History  Family History  Problem Relation Age of Onset   . Lung cancer Mother   . Cancer Mother   . Hypertension Daughter   . Hyperlipidemia Daughter   . Hypertension Son   . Hypertension Daughter    Social History  reports that she quit smoking about 28 years ago. Her smoking use included Cigarettes. She has never used smokeless tobacco. She reports that she does not drink alcohol or use illicit drugs. Allergies  Allergies  Allergen Reactions  . Diltiazem Other (See Comments)    Edema  . Verapamil Other (See Comments)    Diarrhea   Home medications Prior to Admission medications   Medication Sig Start Date End Date Taking? Authorizing Provider  atorvastatin (LIPITOR) 80 MG tablet Take 1 tablet (80 mg total) by mouth at bedtime. 04/05/15  Yes Satira Sark, MD  bisoprolol (ZEBETA) 10 MG tablet take 1 tablet by mouth twice a day 06/13/15  Yes Satira Sark, MD  furosemide (LASIX) 40 MG tablet Take 1 tablet (40 mg total) by mouth 2 (two) times daily. 06/29/15  Yes Satira Sark, MD  losartan (COZAAR) 100 MG tablet take 1 tablet by mouth once daily 05/17/15  Yes Satira Sark, MD  potassium chloride SA (K-DUR,KLOR-CON) 20 MEQ tablet take  1 tablet by mouth once daily 03/23/15  Yes Satira Sark, MD  terazosin (HYTRIN) 5 MG capsule Take 1 capsule (5 mg total) by mouth at bedtime. 09/20/14  Yes Satira Sark, MD  Vitamin D, Ergocalciferol, (DRISDOL) 50000 units CAPS capsule Take 50,000 Units by mouth every 7 (seven) days.   Yes Historical Provider, MD  warfarin (COUMADIN) 5 MG tablet Take 1 tablet (5 mg total) by mouth daily. Patient taking differently: Take 10 mg by mouth daily.  03/04/15  Yes Satira Sark, MD   Liver Function Tests  Recent Labs Lab 07/11/15 1635  AST 21  ALT 13*  ALKPHOS 80  BILITOT 0.9  PROT 7.2  ALBUMIN 3.6   No results for input(s): LIPASE, AMYLASE in the last 168 hours. CBC  Recent Labs Lab 07/11/15 1635  WBC 9.9  NEUTROABS 8.0*  HGB 7.5*  HCT 26.0*  MCV 71.0*  PLT 762   Basic  Metabolic Panel  Recent Labs Lab 07/11/15 1635  NA 137  K 4.0  CL 104  CO2 26  GLUCOSE 133*  BUN 23*  CREATININE 1.33*  CALCIUM 8.7*     Filed Vitals:   07/11/15 2230 07/11/15 2233 07/11/15 2243 07/11/15 2244  BP: 140/71 134/74 126/84 126/84  Pulse: 101 84 83 94  Temp:  98.8 F (37.1 C) 98 F (36.7 C) 98 F (36.7 C)  TempSrc:  Oral Oral Oral  Resp: '15 18 18 18  '$ Height:      Weight:      SpO2: 94% 96% 96% 94%   Exam: VS: BP 140/71  HR 84 afib  RR 18  96% RA Gen alert, alert and energetic elderly female No rash, cyanosis or gangrene Sclera anicteric, throat clear  No jvd or bruits Chest clear bilat to bases Irreg irreg w soft SEM no RG Abd soft ntnd no mass or ascites +bs GU defer MS no joint effusions or deformity Ext 1+ pretib bilat edema / no wounds or ulcers Neuro is alert, Ox 3 , nf   EKG (independently reviewed) > afib, 95 bpm, no acute changes  Home meds :  Lipitor, bisoprolol, losartan, KCl, terazosin, vit D, warfarin   Na 137  K 4.0  CO2 26  BUN 23  Creat 1.33  CA 8.7   eGFR 37   Alb 3.6  LFT's ok  Trop < 0.03  WBC 9k  Hb 7.5  MCV 71 (low)  plt 201   Assessment: 1. Anemia, microcytic - guiac neg in ED.  Admit, prbc's x 2, Fe studies.   2. Fatigue - due to #1 3. Chronic afib - on coumadin/ BB.  INR up a bit, hold coumadin tonight. Also need to clarify the dose of coumadin -- all the cardiology notes for the last 5 years up to and including last week say " coumadin 5 mg/d" but patient insists she is taking 10 mg / day.  May need to check with her pharmacy.   4. HTN - cont BB/ arb/ alpha blocker 5. CAD hx stent 2003 6. R CEA 2006 7. Prior smoker  Plan - as above   DVT Prophylaxis on coumadin  Code Status: full  Family Communication: daughters at bedside  Disposition Plan: home when better    Sol Blazing [facility] Pager [phone]  Cell [phone]  If 7PM-7AM, please contact night-coverage [email or web address] Password  [facility] 07/11/2015, 10:51 PM

## 2015-07-11 NOTE — ED Notes (Signed)
Patient with low HGB level from Dr Delanna Ahmadi office.

## 2015-07-12 ENCOUNTER — Encounter (HOSPITAL_COMMUNITY): Payer: Self-pay | Admitting: *Deleted

## 2015-07-12 DIAGNOSIS — R5382 Chronic fatigue, unspecified: Secondary | ICD-10-CM | POA: Diagnosis not present

## 2015-07-12 DIAGNOSIS — Z7901 Long term (current) use of anticoagulants: Secondary | ICD-10-CM | POA: Diagnosis not present

## 2015-07-12 DIAGNOSIS — D649 Anemia, unspecified: Secondary | ICD-10-CM | POA: Diagnosis not present

## 2015-07-12 DIAGNOSIS — I482 Chronic atrial fibrillation: Secondary | ICD-10-CM | POA: Diagnosis not present

## 2015-07-12 LAB — IRON AND TIBC
IRON: 13 ug/dL — AB (ref 28–170)
Saturation Ratios: 3 % — ABNORMAL LOW (ref 10.4–31.8)
TIBC: 487 ug/dL — AB (ref 250–450)
UIBC: 474 ug/dL

## 2015-07-12 LAB — CBC
HCT: 29.6 % — ABNORMAL LOW (ref 36.0–46.0)
HEMOGLOBIN: 8.7 g/dL — AB (ref 12.0–15.0)
MCH: 21.2 pg — ABNORMAL LOW (ref 26.0–34.0)
MCHC: 29.4 g/dL — ABNORMAL LOW (ref 30.0–36.0)
MCV: 72.2 fL — ABNORMAL LOW (ref 78.0–100.0)
PLATELETS: 170 10*3/uL (ref 150–400)
RBC: 4.1 MIL/uL (ref 3.87–5.11)
RDW: 16.9 % — ABNORMAL HIGH (ref 11.5–15.5)
WBC: 8.7 10*3/uL (ref 4.0–10.5)

## 2015-07-12 LAB — PREPARE RBC (CROSSMATCH)

## 2015-07-12 LAB — CK: CK TOTAL: 34 U/L — AB (ref 38–234)

## 2015-07-12 LAB — VITAMIN B12: Vitamin B-12: 397 pg/mL (ref 180–914)

## 2015-07-12 LAB — FERRITIN: FERRITIN: 7 ng/mL — AB (ref 11–307)

## 2015-07-12 LAB — FOLATE: Folate: 15 ng/mL (ref >5.9)

## 2015-07-12 LAB — PROTIME-INR
INR: 3.27 — AB (ref 0.00–1.49)
PROTHROMBIN TIME: 32.6 s — AB (ref 11.6–15.2)

## 2015-07-12 MED ORDER — WARFARIN - PHYSICIAN DOSING INPATIENT: Freq: Every day | Status: DC

## 2015-07-12 MED ORDER — FUROSEMIDE 10 MG/ML IJ SOLN
40.0000 mg | Freq: Two times a day (BID) | INTRAMUSCULAR | Status: AC
  Administered 2015-07-12 (×2): 40 mg via INTRAVENOUS
  Filled 2015-07-12 (×2): qty 4

## 2015-07-12 MED ORDER — SODIUM CHLORIDE 0.9 % IV SOLN
Freq: Once | INTRAVENOUS | Status: AC
  Administered 2015-07-12: 11:00:00 via INTRAVENOUS

## 2015-07-12 NOTE — Progress Notes (Signed)
[facility] PROGRESS NOTE  [name] [id number] DOB: [birth year] DOA: 07/11/2015 PCP: Purvis Kilts, MD  Assessment/Plan: 1. Anemia- status post 2 units PRBC, today hemoglobin is 8.7, ? cause, anemia panel results are pending at this time. Stool guaiac ordered. Patient's last hemoglobin was checked in 2015 at that time it was 13.2. Will transfuse 1 unit of PRBC. Follow stool for occult blood and iron studies. Will give 2 doses of Lasix 40 mg IV every 12 hours 2. Follow BMP in a.m. 2. Fatigue- secondary to anemia. Should improve with blood transfusion 3. Chronic atrial fibrillation- patient on Coumadin for anticoagulation. Continue bisoprolol 10 milligrams twice a day. 4. CAD status post RCA intervention in 2003- stable, continue bisoprolol, atorvastatin.   Code Status: Full code Family Communication: *Discussed with patient's daughter at bedside Disposition Plan: Pending workup for anemia   Consultants:  None  Procedures:  None  Antibiotics:  None  HPI/Subjective: 82 y.o. female with hx of HTN, afib and HL sent to ED from PCP's office because of low Hb and recent fatigue. In ED Hb 7.5, 2u prbc's ordered and infusing.  This morning patient feels better, hemoglobin 8.7 today. She denies any history of black colored stool or vomiting blood. Stool for occult blood is pending at this time.  Objective: Filed Vitals:   07/12/15 0100 07/12/15 0436  BP: 125/61 115/53  Pulse: 88 74  Temp: 98 F (36.7 C) 98 F (36.7 C)  Resp: 18 20    Intake/Output Summary (Last 24 hours) at 07/12/15 0920 Last data filed at 07/12/15 0049  Gross per 24 hour  Intake   1316 ml  Output      0 ml  Net   1316 ml   Filed Weights   07/11/15 1617 07/12/15 0436  Weight: 81.647 kg (180 lb) 80.468 kg (177 lb 6.4 oz)    Exam:   General:  Appears in no acute distress  Cardiovascular: S1-S2 normal, irregular rhythm  Respiratory: Clear to auscultation bilaterally, no  crackles or wheezing  Abdomen: Soft, nontender, no organomegaly  Musculoskeletal: No cyanosis/clubbing/edema of the lower extremities   Data Reviewed: Basic Metabolic Panel:  Recent Labs Lab 07/11/15 1635  NA 137  K 4.0  CL 104  CO2 26  GLUCOSE 133*  BUN 23*  CREATININE 1.33*  CALCIUM 8.7*   Liver Function Tests:  Recent Labs Lab 07/11/15 1635  AST 21  ALT 13*  ALKPHOS 80  BILITOT 0.9  PROT 7.2  ALBUMIN 3.6   CBC:  Recent Labs Lab 07/11/15 1635 07/12/15 0255  WBC 9.9 8.7  NEUTROABS 8.0*  --   HGB 7.5* 8.7*  HCT 26.0* 29.6*  MCV 71.0* 72.2*  PLT 201 170   Cardiac Enzymes:  Recent Labs Lab 07/11/15 1635  TROPONINI <0.03      Studies: No results found.  Scheduled Meds: . sodium chloride   Intravenous Once  . atorvastatin  80 mg Oral QHS  . bisoprolol  10 mg Oral BID  . furosemide  40 mg Intravenous Q12H  . losartan  100 mg Oral Daily  . potassium chloride SA  20 mEq Oral Daily  . sodium chloride flush  3 mL Intravenous Q12H  . terazosin  5 mg Oral QHS   Continuous Infusions:   Active Problems:   Chronic atrial fibrillation (HCC)   Chronic anticoagulation   Hypertension   Symptomatic anemia   Microcytic anemia   Fatigue    Time spent: 25 min  [facility] Pager [phone]. If 7PM-7AM, please contact night-coverage at [email or web address], password [facility] 07/12/2015, 9:20 AM

## 2015-07-12 NOTE — Care Management Note (Signed)
Case Management Note  Patient Details  Name: [name] MRN: [id number] Date of Birth: [birth year]  Subjective/Objective:                  Pt admitted with symptomatic anemia. Pt is from home, lives alone and has strong family support, daughter at bedside. Pt is ind with ADL's. Pt has PCP, transportation and no difficulty obtaining meds. Pt plans to return home with self care at DC.   Action/Plan: No CM needs anticipated.   Expected Discharge Date:  07/12/15               Expected Discharge Plan:  Home/Self Care  In-House Referral:  NA  Discharge planning Services  CM Consult  Post Acute Care Choice:  NA Choice offered to:  NA  DME Arranged:    DME Agency:     HH Arranged:    HH Agency:     Status of Service:  Completed, signed off  Medicare Important Message Given:    Date Medicare IM Given:    Medicare IM give by:    Date Additional Medicare IM Given:    Additional Medicare Important Message give by:     If discussed at [place] of Stay Meetings, dates discussed:    Additional Comments:  Sherald Barge, RN 07/12/2015, 3:38 PM

## 2015-07-12 NOTE — Progress Notes (Signed)
Report given to [facility]. Pt A&O and stable at this time.

## 2015-07-12 NOTE — Care Management Obs Status (Signed)
[place] NOTIFICATION   Patient Details  Name: [name] MRN: [id number] Date of Birth: 1932-11-17   Medicare Observation Status Notification Given:  Yes    Sherald Barge, RN 07/12/2015, 3:37 PM

## 2015-07-13 ENCOUNTER — Encounter (HOSPITAL_COMMUNITY): Payer: Medicare Other

## 2015-07-13 ENCOUNTER — Telehealth: Payer: Self-pay | Admitting: *Deleted

## 2015-07-13 ENCOUNTER — Other Ambulatory Visit (HOSPITAL_COMMUNITY): Payer: Self-pay

## 2015-07-13 ENCOUNTER — Inpatient Hospital Stay (HOSPITAL_COMMUNITY): Admission: RE | Admit: 2015-07-13 | Payer: Self-pay | Source: Ambulatory Visit

## 2015-07-13 DIAGNOSIS — I482 Chronic atrial fibrillation: Secondary | ICD-10-CM

## 2015-07-13 DIAGNOSIS — D509 Iron deficiency anemia, unspecified: Secondary | ICD-10-CM

## 2015-07-13 DIAGNOSIS — I10 Essential (primary) hypertension: Secondary | ICD-10-CM | POA: Diagnosis not present

## 2015-07-13 DIAGNOSIS — D649 Anemia, unspecified: Secondary | ICD-10-CM

## 2015-07-13 DIAGNOSIS — Z7901 Long term (current) use of anticoagulants: Secondary | ICD-10-CM | POA: Diagnosis not present

## 2015-07-13 DIAGNOSIS — R5383 Other fatigue: Secondary | ICD-10-CM

## 2015-07-13 LAB — TYPE AND SCREEN
ABO/RH(D): O POS
Antibody Screen: NEGATIVE
Unit division: 0
Unit division: 0
Unit division: 0
Unit division: 0

## 2015-07-13 LAB — BASIC METABOLIC PANEL
Anion gap: 10 (ref 5–15)
BUN: 16 mg/dL (ref 6–20)
CALCIUM: 8.9 mg/dL (ref 8.9–10.3)
CHLORIDE: 103 mmol/L (ref 101–111)
CO2: 27 mmol/L (ref 22–32)
CREATININE: 1.01 mg/dL — AB (ref 0.44–1.00)
GFR calc non Af Amer: 51 mL/min — ABNORMAL LOW (ref >60)
GFR, EST AFRICAN AMERICAN: 59 mL/min — AB (ref >60)
Glucose, Bld: 103 mg/dL — ABNORMAL HIGH (ref 65–99)
Potassium: 3.7 mmol/L (ref 3.5–5.1)
SODIUM: 140 mmol/L (ref 135–145)

## 2015-07-13 LAB — CBC
HCT: 33.1 % — ABNORMAL LOW (ref 36.0–46.0)
Hemoglobin: 9.8 g/dL — ABNORMAL LOW (ref 12.0–15.0)
MCH: 21.7 pg — AB (ref 26.0–34.0)
MCHC: 29.6 g/dL — AB (ref 30.0–36.0)
MCV: 73.2 fL — ABNORMAL LOW (ref 78.0–100.0)
PLATELETS: 182 10*3/uL (ref 150–400)
RBC: 4.52 MIL/uL (ref 3.87–5.11)
RDW: 17.4 % — AB (ref 11.5–15.5)
WBC: 8.2 10*3/uL (ref 4.0–10.5)

## 2015-07-13 LAB — PROTIME-INR
INR: 2.55 — AB (ref 0.00–1.49)
PROTHROMBIN TIME: 27.1 s — AB (ref 11.6–15.2)

## 2015-07-13 MED ORDER — SENNA 8.6 MG PO TABS: 1.0000 | ORAL_TABLET | Freq: Every day | ORAL | Status: DC

## 2015-07-13 MED ORDER — FERROUS SULFATE 325 (65 FE) MG PO TABS: 325.0000 mg | ORAL_TABLET | Freq: Every day | ORAL | Status: DC

## 2015-07-13 NOTE — Telephone Encounter (Signed)
Patient was taken off of Coumadin in hospital and is asking if she needs to start it back. / tg

## 2015-07-13 NOTE — Plan of Care (Signed)
Report rec'd; assume care of patient  [name] [facility]

## 2015-07-13 NOTE — Discharge Summary (Signed)
Physician Discharge Summary  [name] [id number] DOB: 1942-07-08 DOA: 07/11/2015  PCP: Purvis Kilts, MD  Admit date: 07/11/2015 Discharge date: 07/13/2015  Time spent: 20 minutes  Recommendations for Outpatient Follow-up:  1. Follow up with PCP in 2-3 weeks 2. Recommend follow up CBC within one week 3. Follow up with GI as outpatient. I have discussed with Dr. Laural Golden prior to discharge   Discharge Diagnoses:  Active Problems:   Chronic atrial fibrillation (HCC)   Chronic anticoagulation   Hypertension   Symptomatic anemia   Microcytic anemia   Fatigue   Discharge Condition: Stable  Diet recommendation: Heart healthy  Filed Weights   07/11/15 1617 07/12/15 0436 07/13/15 0500  Weight: 81.647 kg (180 lb) 80.468 kg (177 lb 6.4 oz) 80.287 kg (177 lb)    History of present illness:  Please review dictated H and P from 3/27 for details. Briefly, 82yo with hx of HTN, afib on coumadin presenting with fatigue. Pt found to have hgb of 7.5. In ED, stools were noted to be heme neg at that time. Patient was admitted for further work up.  Hospital Course:  1. Iron Deficient Anemia- status post 3 units PRBC's this admission, responding appropriately. Hgb on discharge noted to be 9.8. Patient clinically much improved and is wanting to go home. Patient is colonoscopy naive. I have discussed case with GI who will contact patient to arrange outpatient follow up. Would ask PCP to repeat CBC within one week. 2. Fatigue- secondary to anemia. Much improved with transfusion 3. Chronic atrial fibrillation- patient on Coumadin for anticoagulation. Continued bisoprolol 10 milligrams twice a day. 4. CAD status post RCA intervention in 2003- stable, continued bisoprolol, atorvastatin.  Consultations:  Discussed with GI  Discharge Exam: Filed Vitals:   07/12/15 1321 07/12/15 2032 07/13/15 0500 07/13/15 1049  BP: 120/89 131/61 125/62 112/52  Pulse: 97 86 72 66  Temp: 98.2 F (36.8  C) 98.2 F (36.8 C) 98.3 F (36.8 C) 98.1 F (36.7 C)  TempSrc: Oral Oral Oral Axillary  Resp: 20 20 20 20   Height:      Weight:   80.287 kg (177 lb)   SpO2: 95% 96% 95% 95%    General: Awake, in nad Cardiovascular: regular, s1, s2 Respiratory: normal resp effort, no wheezing  Discharge Instructions     Medication List    TAKE these medications        atorvastatin 80 MG tablet  Commonly known as:  LIPITOR  Take 1 tablet (80 mg total) by mouth at bedtime.     bisoprolol 10 MG tablet  Commonly known as:  ZEBETA  take 1 tablet by mouth twice a day     ferrous sulfate 325 (65 FE) MG tablet  Take 1 tablet (325 mg total) by mouth daily with breakfast.     furosemide 40 MG tablet  Commonly known as:  LASIX  Take 1 tablet (40 mg total) by mouth 2 (two) times daily.     losartan 100 MG tablet  Commonly known as:  COZAAR  take 1 tablet by mouth once daily     potassium chloride SA 20 MEQ tablet  Commonly known as:  K-DUR,KLOR-CON  take 1 tablet by mouth once daily     senna 8.6 MG Tabs tablet  Commonly known as:  SENOKOT  Take 1 tablet (8.6 mg total) by mouth daily.     terazosin 5 MG capsule  Commonly known as:  HYTRIN  Take 1 capsule (5 mg  total) by mouth at bedtime.     Vitamin D (Ergocalciferol) 50000 units Caps capsule  Commonly known as:  DRISDOL  Take 50,000 Units by mouth every 7 (seven) days.     warfarin 5 MG tablet  Commonly known as:  COUMADIN  Take 1 tablet (5 mg total) by mouth daily.       Allergies  Allergen Reactions  . Diltiazem Other (See Comments)    Edema  . Verapamil Other (See Comments)    Diarrhea   Follow-up Information    Follow up with Purvis Kilts, MD. Schedule an appointment as soon as possible for a visit in 2 weeks.   Specialty:  Family Medicine   Why:  Hospital follow up   Contact information:   [address] [place] [id number] [phone]       Follow up with You will be contacted by  Gastroenterology to schedule an appointment.       The results of significant diagnostics from this hospitalization (including imaging, microbiology, ancillary and laboratory) are listed below for reference.    Significant Diagnostic Studies: No results found.  Microbiology: No results found for this or any previous visit (from the past 240 hour(s)).   Labs: Basic Metabolic Panel:  Recent Labs Lab 07/11/15 1635 07/13/15 0641  NA 137 140  K 4.0 3.7  CL 104 103  CO2 26 27  GLUCOSE 133* 103*  BUN 23* 16  CREATININE 1.33* 1.01*  CALCIUM 8.7* 8.9   Liver Function Tests:  Recent Labs Lab 07/11/15 1635  AST 21  ALT 13*  ALKPHOS 80  BILITOT 0.9  PROT 7.2  ALBUMIN 3.6   No results for input(s): LIPASE, AMYLASE in the last 168 hours. No results for input(s): AMMONIA in the last 168 hours. CBC:  Recent Labs Lab 07/11/15 1635 07/12/15 0255 07/13/15 0641  WBC 9.9 8.7 8.2  NEUTROABS 8.0*  --   --   HGB 7.5* 8.7* 9.8*  HCT 26.0* 29.6* 33.1*  MCV 71.0* 72.2* 73.2*  PLT 201 170 182   Cardiac Enzymes:  Recent Labs Lab 07/11/15 1635 07/12/15 0931  CKTOTAL  --  34*  TROPONINI <0.03  --    BNP: BNP (last 3 results) No results for input(s): BNP in the last 8760 hours.  ProBNP (last 3 results) No results for input(s): PROBNP in the last 8760 hours.  CBG: No results for input(s): GLUCAP in the last 168 hours.   Signed:  Leanza Shepperson K  [facility] 07/13/2015, 1:25 PM

## 2015-07-13 NOTE — Telephone Encounter (Signed)
error 

## 2015-07-13 NOTE — Telephone Encounter (Signed)
I was called by Dr. Wyline Copas to see patient on outpatient basis. Apparently patient wanted to leave and did not want to be seen on inpatient basis. Her stool was guaiac-negative. Therefore it was felt she could continue warfarin until office visit.

## 2015-07-13 NOTE — Telephone Encounter (Signed)
I was not involved in the case during hospital management. Discharge summary is not entirely clear, but does mention Coumadin as a discharge medication, presumably it was to be restarted. It sounds like Dr. Laural Golden was involved in the case and is to see her as an outpatient. I would recommend checking with Dr. Olevia Perches office about whether she needs to stay on Coumadin or not as it relates to how quickly she may be undergoing a colonoscopy.

## 2015-07-13 NOTE — Telephone Encounter (Signed)
Please review D/C Summary from 07/13/15.   Can pt resume warfarin or should she wait till after GI Appt?

## 2015-07-13 NOTE — Progress Notes (Signed)
Pt's IV catheter removed and intact. Pt's IV site clean dry and intact. Discharge instructions, including follow up appointments and medications were reviewed and discussed with patient and her daughters. Pt and family verbalized understanding of medications and follow up appointments. All questions were answered and no further questions at this time. Pt in stable condition and in no acute distress at time of discharge. Pt escorted by nurse tech.

## 2015-07-14 NOTE — Telephone Encounter (Signed)
Spoke with pt.  Told her to restart coumadin tonight at her regular dose of 5mg  daily.  INR appt made for 08/01/15.  She has appt with Dr Laural Golden 07/18/15.

## 2015-07-18 ENCOUNTER — Other Ambulatory Visit (INDEPENDENT_AMBULATORY_CARE_PROVIDER_SITE_OTHER): Payer: Self-pay | Admitting: Internal Medicine

## 2015-07-18 ENCOUNTER — Ambulatory Visit (INDEPENDENT_AMBULATORY_CARE_PROVIDER_SITE_OTHER): Payer: Medicare Other | Admitting: Internal Medicine

## 2015-07-18 ENCOUNTER — Encounter (INDEPENDENT_AMBULATORY_CARE_PROVIDER_SITE_OTHER): Payer: Self-pay | Admitting: Internal Medicine

## 2015-07-18 VITALS — BP 94/48 | HR 64 | Temp 97.6°F | Ht 62.0 in | Wt 176.8 lb

## 2015-07-18 DIAGNOSIS — N912 Amenorrhea, unspecified: Secondary | ICD-10-CM

## 2015-07-18 DIAGNOSIS — D509 Iron deficiency anemia, unspecified: Secondary | ICD-10-CM | POA: Diagnosis not present

## 2015-07-18 DIAGNOSIS — I6523 Occlusion and stenosis of bilateral carotid arteries: Secondary | ICD-10-CM | POA: Diagnosis not present

## 2015-07-18 DIAGNOSIS — R195 Other fecal abnormalities: Secondary | ICD-10-CM | POA: Diagnosis not present

## 2015-07-18 NOTE — Progress Notes (Signed)
Subjective:    Patient ID: [name], female    DOB: 1933/03/27, 82 y.o.   MRN: [id number]  HPI Referred by Hospital ist services  for anemia. On admission her hemoglobin was 7.5 and received 3 units of blood. Stool was negative in the ED. Patient states she has not seen any blood. Her appetite is good. She has lost a very small amt of weight. She is a light eater.  She has a BM daily.  Stool are black since starting the iron.  She has never undergone a colonoscopy.  Hx significant for cardiac stent atrial fib and maintained on Warfarin  07/11/2015 Ferritin 7, Iron 13, TIBC 487 Vitamin B12 397 Folate 15. CBC Latest Ref Rng 07/13/2015 07/12/2015 07/11/2015  WBC 4.0 - 10.5 K/uL 8.2 8.7 9.9  Hemoglobin 12.0 - 15.0 g/dL 9.8(L) 8.7(L) 7.5(L)  Hematocrit 36.0 - 46.0 % 33.1(L) 29.6(L) 26.0(L)  Platelets 150 - 400 K/uL 182 170 201      CBC    Component Value Date/Time   WBC 8.2 07/13/2015 0641   RBC 4.52 07/13/2015 0641   RBC 3.70* 07/11/2015 1635   HGB 9.8* 07/13/2015 0641   HCT 33.1* 07/13/2015 0641   PLT 182 07/13/2015 0641   MCV 73.2* 07/13/2015 0641   MCH 21.7* 07/13/2015 0641   MCHC 29.6* 07/13/2015 0641   RDW 17.4* 07/13/2015 0641   LYMPHSABS 0.7 07/11/2015 1635   MONOABS 1.1* 07/11/2015 1635   EOSABS 0.1 07/11/2015 1635   BASOSABS 0.0 07/11/2015 1635       Review of Systems Past Medical History  Diagnosis Date  . Coronary atherosclerosis of native coronary artery     RCA stent in 2003; residual 60% mid Cx and OM2  . Carotid artery disease ([place])     RIght CEA 05/2004  . Essential hypertension, benign   . Hyperlipidemia   . Renal artery stenosis (HCC)     Bilateral renal artery stents 2004  . Tobacco abuse, in remission     40 pack year stopped in 1989  . Atrial fibrillation ([place])   . Venous insufficiency (chronic) (peripheral)     Pedal edema  . Chronic anticoagulation   . Obesity     Past Surgical History  Procedure Laterality Date  .  Colonoscopy  Never    Declines  . Carotid endarterectomy      Right in 2005, left in 2010  . Coronary angioplasty      Allergies  Allergen Reactions  . Diltiazem Other (See Comments)    Edema  . Verapamil Other (See Comments)    Diarrhea    Current Outpatient Prescriptions on File Prior to Visit  Medication Sig Dispense Refill  . atorvastatin (LIPITOR) 80 MG tablet Take 1 tablet (80 mg total) by mouth at bedtime. 60 tablet 6  . bisoprolol (ZEBETA) 10 MG tablet take 1 tablet by mouth twice a day 60 tablet 01  . ferrous sulfate 325 (65 FE) MG tablet Take 1 tablet (325 mg total) by mouth daily with breakfast. 30 tablet 0  . furosemide (LASIX) 40 MG tablet Take 1 tablet (40 mg total) by mouth 2 (two) times daily. 180 tablet 3  . losartan (COZAAR) 100 MG tablet take 1 tablet by mouth once daily 90 tablet 3  . potassium chloride SA (K-DUR,KLOR-CON) 20 MEQ tablet take 1 tablet by mouth once daily 30 tablet 11  . terazosin (HYTRIN) 5 MG capsule Take 1 capsule (5 mg total) by mouth at bedtime. [place]  capsule 3  . Vitamin D, Ergocalciferol, (DRISDOL) 50000 units CAPS capsule Take 50,000 Units by mouth every 7 (seven) days.    Marland Kitchen warfarin (COUMADIN) 5 MG tablet Take 1 tablet (5 mg total) by mouth daily. (Patient taking differently: Take 10 mg by mouth daily. ) 40 tablet 3  . senna (SENOKOT) 8.6 MG TABS tablet Take 1 tablet (8.6 mg total) by mouth daily. 120 each 0   No current facility-administered medications on file prior to visit.        Objective:   Physical Exam Blood pressure 94/48, pulse 64, temperature 97.6 F (36.4 C), height 5\' 2"  (1.575 m), weight 176 lb 12.8 oz (80.196 kg).  Alert and oriented. Skin warm and dry. Oral mucosa is moist.   . Sclera anicteric, conjunctivae is pink. Thyroid not enlarged. No cervical lymphadenopathy. Lungs clear. Heart regular rate and rhythm.  Abdomen is soft. Bowel sounds are positive. No hepatomegaly. No abdominal masses felt. No tenderness.  No edema  to lower extremities.  Stool dark brown and guaiac positive. Possible rectal mass.   Lot [id number] Ex 9/17    Assessment & Plan:  Anemia?Guaiac positive stool : Colonic neoplasm needs to be ruled out. PUD , gastric cancer needs to be ruled out.  EGD/Colonoscopy. The risks and benefits such as perforation, bleeding, and infection were reviewed with the patient and is agreeable.

## 2015-07-18 NOTE — Patient Instructions (Signed)
Colonoscopy/EGD. The risks and benefits such as perforation, bleeding, and infection were reviewed with the patient and is agreeable.

## 2015-07-19 ENCOUNTER — Encounter (INDEPENDENT_AMBULATORY_CARE_PROVIDER_SITE_OTHER): Payer: Self-pay | Admitting: *Deleted

## 2015-07-19 ENCOUNTER — Other Ambulatory Visit (INDEPENDENT_AMBULATORY_CARE_PROVIDER_SITE_OTHER): Payer: Self-pay | Admitting: *Deleted

## 2015-07-19 ENCOUNTER — Telehealth (INDEPENDENT_AMBULATORY_CARE_PROVIDER_SITE_OTHER): Payer: Self-pay | Admitting: *Deleted

## 2015-07-19 DIAGNOSIS — I4891 Unspecified atrial fibrillation: Secondary | ICD-10-CM | POA: Diagnosis not present

## 2015-07-19 DIAGNOSIS — D509 Iron deficiency anemia, unspecified: Secondary | ICD-10-CM | POA: Diagnosis not present

## 2015-07-19 DIAGNOSIS — E6609 Other obesity due to excess calories: Secondary | ICD-10-CM | POA: Diagnosis not present

## 2015-07-19 DIAGNOSIS — Z6833 Body mass index (BMI) 33.0-33.9, adult: Secondary | ICD-10-CM | POA: Diagnosis not present

## 2015-07-19 DIAGNOSIS — E538 Deficiency of other specified B group vitamins: Secondary | ICD-10-CM | POA: Diagnosis not present

## 2015-07-19 DIAGNOSIS — Z1389 Encounter for screening for other disorder: Secondary | ICD-10-CM | POA: Diagnosis not present

## 2015-07-19 NOTE — Telephone Encounter (Signed)
Patient aware.

## 2015-07-19 NOTE — Telephone Encounter (Signed)
Ok to hold coumadin 5 days prior to procedure.  Resume coumadin night of procedure if OK with Dr Laural Golden.  Follow up appt with me for INR check 7-10 days after restarting coumadin

## 2015-07-19 NOTE — Telephone Encounter (Signed)
Patient is sch'd for colonoscopy & endoscopy 08/19/15 -- she needs to stop Warfarin 5 days before, please advise if this is ok, thanks

## 2015-07-19 NOTE — Telephone Encounter (Signed)
trilyte

## 2015-07-20 MED ORDER — PEG 3350-KCL-NA BICARB-NACL 420 G PO SOLR: 4000.0000 mL | Freq: Once | ORAL | Status: DC

## 2015-07-21 ENCOUNTER — Ambulatory Visit (INDEPENDENT_AMBULATORY_CARE_PROVIDER_SITE_OTHER): Payer: Medicare Other | Admitting: Cardiology

## 2015-07-21 ENCOUNTER — Encounter: Payer: Self-pay | Admitting: Cardiology

## 2015-07-21 VITALS — BP 90/60 | HR 76 | Ht 62.0 in | Wt 174.0 lb

## 2015-07-21 DIAGNOSIS — I6523 Occlusion and stenosis of bilateral carotid arteries: Secondary | ICD-10-CM | POA: Diagnosis not present

## 2015-07-21 DIAGNOSIS — I2510 Atherosclerotic heart disease of native coronary artery without angina pectoris: Secondary | ICD-10-CM

## 2015-07-21 DIAGNOSIS — D500 Iron deficiency anemia secondary to blood loss (chronic): Secondary | ICD-10-CM | POA: Diagnosis not present

## 2015-07-21 DIAGNOSIS — I482 Chronic atrial fibrillation, unspecified: Secondary | ICD-10-CM

## 2015-07-21 DIAGNOSIS — I10 Essential (primary) hypertension: Secondary | ICD-10-CM

## 2015-07-21 NOTE — Progress Notes (Signed)
Cardiology Office Note  Date: 07/21/2015   ID: [name], DOB [birth year], MRN [id number]  PCP: Purvis Kilts, MD  Primary Cardiologist: Rozann Lesches, MD   Chief Complaint  Perkins presents with  . Follow-up testing    History of Present Illness: [name] is an 81 y.o. female last seen in March. She was referred at that time for follow-up cardiac structural and ischemic testing. In Peggy interim she saw Dr. Johnsie Cancel as an add-on for fatigue and dizziness that was felt to be possibly related to URI rather than cardiac etiology. She was evaluated by her PCP, ultimately found to be significantly anemic with hemoglobin of 7.5. She was admitted to Peggy hospital requiring 3 units of packed red cells. She has had subsequent GI evaluation with planned colonoscopy per Dr. Laural Golden. She continues on Coumadin at this time.  She wore a 48-hour Holter monitor per Dr. Kyla Balzarine recommendation, findings demonstrating atrial fibrillation with average heart rate in Peggy 80s, intermittent mild RVR. No pauses. Follow-up echocardiogram is reviewed below, LVEF normal range with valvular abnormalities are relatively stable and not necessarily symptom provoking. She did not complete Peggy ordered stress test due to Peggy  hospitalization with anemia.  Serial CBCs showed hemoglobin of 7.5 up to 9.8 after transfusion.  She is here today with her daughter. States she feels much better. She does not report any palpitations and indicates that her breathing has improved. Blood pressure low today but she was asymptomatic. States that her systolic is usually between 100 115 when she checks it at home. She does not report any melena or hematochezia, no dizziness or syncope.  Her weight is down compared to last visit.  Past Medical History  Diagnosis Date  . Coronary atherosclerosis of native coronary artery     RCA stent in 2003; residual 60% mid Cx and OM2  . Carotid artery disease ([place])     RIght CEA  05/2004  . Essential hypertension, benign   . Hyperlipidemia   . Renal artery stenosis (HCC)     Bilateral renal artery stents 2004  . Tobacco abuse, in remission     40 pack year stopped in 1989  . Atrial fibrillation ([place])   . Venous insufficiency (chronic) (peripheral)     Pedal edema  . Chronic anticoagulation   . Obesity     Past Surgical History  Procedure Laterality Date  . Colonoscopy  Never    Declines  . Carotid endarterectomy      Right in 2005, left in 2010  . Coronary angioplasty      Current Outpatient Prescriptions  Medication Sig Dispense Refill  . atorvastatin (LIPITOR) 80 MG tablet Take 1 tablet (80 mg total) by mouth at bedtime. 60 tablet 6  . bisoprolol (ZEBETA) 10 MG tablet take 1 tablet by mouth twice a day 60 tablet 01  . ferrous sulfate 325 (65 FE) MG tablet Take 1 tablet (325 mg total) by mouth daily with breakfast. 30 tablet 0  . furosemide (LASIX) 40 MG tablet Take 1 tablet (40 mg total) by mouth 2 (two) times daily. 180 tablet 3  . losartan (COZAAR) 100 MG tablet take 1 tablet by mouth once daily 90 tablet 3  . polyethylene glycol-electrolytes (NULYTELY/GOLYTELY) 420 g solution Take 4,000 mLs by mouth once. 4000 mL 0  . potassium chloride SA (K-DUR,KLOR-CON) 20 MEQ tablet take 1 tablet by mouth once daily 30 tablet 11  . senna (SENOKOT) 8.6 MG TABS tablet Take 1  tablet (8.6 mg total) by mouth daily. 120 each 0  . terazosin (HYTRIN) 5 MG capsule Take 1 capsule (5 mg total) by mouth at bedtime. 90 capsule 3  . Vitamin D, Ergocalciferol, (DRISDOL) 50000 units CAPS capsule Take 50,000 Units by mouth every 7 (seven) days.    Marland Kitchen warfarin (COUMADIN) 5 MG tablet Take 1 tablet (5 mg total) by mouth daily. (Perkins taking differently: Take 10 mg by mouth daily. ) 40 tablet 3   No current facility-administered medications for this visit.   Allergies:  Diltiazem and Verapamil   Social History: [name]  reports that she quit smoking about 28 years ago. Her  smoking use included Cigarettes. She has never used smokeless tobacco. She reports that she does not drink alcohol or use illicit drugs.   Family History: [name]'s family history includes Cancer in her mother; Heart disease in her maternal grandmother; Hyperlipidemia in her daughter; Hypertension in her daughter, daughter, and son; Lung cancer in her mother.   ROS:  Please see Peggy history of present illness. Otherwise, complete review of systems is positive for NYHA class II dyspnea.  All other systems are reviewed and negative.   Physical Exam: VS:  BP 90/60 mmHg  Pulse 76  Ht 5\' 2"  (1.575 m)  Wt 174 lb (78.926 kg)  BMI 31.82 kg/m2  SpO2 96%, BMI Body mass index is 31.82 kg/(m^2).  Wt Readings from Last 3 Encounters:  07/21/15 174 lb (78.926 kg)  07/18/15 176 lb 12.8 oz (80.196 kg)  07/13/15 177 lb (80.287 kg)    Pleasant elderly woman, no distress.  HEENT: Conjunctiva and lids normal, oropharynx clear.  Neck: Supple, no elevated JVP or carotid bruits, no thyromegaly.  Lungs: Clear to auscultation, nonlabored breathing at rest.  Cardiac: Irregularly irregular, no S3 or significant systolic murmur, no pericardial rub.  Abdomen: Soft, nontender,bowel sounds present.  Extremities: No pitting edema, distal pulses 2+.  Skin: Warm and dry.  Musculoskeletal: No kyphosis.  Neuropsychiatric: Alert and oriented x3, affect grossly appropriate.  ECG: I personally reviewed Peggy prior tracing from 07/11/2015 which showed atrial fibrillation at 95 bpm with diffuse repolarization abnormalities.  Recent Labwork: 07/11/2015: ALT 13*; AST 21 07/13/2015: BUN 16; Creatinine, Ser 1.01*; Hemoglobin 9.8*; Platelets 182; Potassium 3.7; Sodium 140     Component Value Date/Time   CHOL 91* 12/22/2014 1035   TRIG 79 12/22/2014 1035   HDL 34* 12/22/2014 1035   CHOLHDL 2.7 12/22/2014 1035   VLDL 16 12/22/2014 1035   LDLCALC 41 12/22/2014 1035    Other Studies Reviewed  Today:  Echocardiogram 07/07/2015: Study Conclusions  - Left ventricle: Peggy cavity size was normal. Wall thickness was  increased in a pattern of mild LVH. Systolic function was normal.  Peggy estimated ejection fraction was in Peggy range of 60% to 65%. - Mitral valve: There was mild regurgitation. - Left atrium: Peggy atrium was mildly dilated. - Right ventricle: Peggy cavity size was moderately dilated. - Right atrium: Peggy atrium was moderately dilated. - Atrial septum: No defect or patent foramen ovale was identified. - Tricuspid valve: There was moderate regurgitation. - Pulmonary arteries: PA peak pressure: 42 mm Hg (S).  Assessment and Plan:  1. CAD status post previous stent placement in 2003 with moderate residual circumflex and OM 2 disease. Our plan is to continue with medical therapy and observation for now, agree with canceling Cardiolite since she feels so much better after packed red cell transfusion for anemia. LVEF remains normal range.  2. Chronic atrial fibrillation, heart rate control looks to be adequate on current regimen. She will continue on Coumadin, although we will need to hold this prior to colonoscopy in May. She is followed in our anticoagulation clinic. Doubt that she will need a Lovenox bridge based on review of her history.  3. Essential hypertension, blood pressure is low today but she is asymptomatic. She follows this at home, recommended that she let us know if systolic is more consistently under 100. Cozaar could be cut to 50 mg daily.  4. Recent anemia with heme positive stools as outlined above. GI blood loss suspected. She is to undergo colonoscopy per Dr. Laural Golden in May.  Current medicines were reviewed with [name] today.  Disposition: FU with me in 3 months.   Signed, Satira Sark, MD, [facility] 07/21/2015 1:25 PM    [facility] at [facility] [address], [place], [place] [address] Phone: [phone]; Fax: [phone]

## 2015-07-21 NOTE — Patient Instructions (Signed)
Your physician recommends that you schedule a follow-up appointment in:  3 months    Your physician recommends that you continue on your current medications as directed. Please refer to the Current Medication list given to you today.     Thank you for choosing [facility] !   

## 2015-07-27 DIAGNOSIS — Z1389 Encounter for screening for other disorder: Secondary | ICD-10-CM | POA: Diagnosis not present

## 2015-07-27 DIAGNOSIS — E6609 Other obesity due to excess calories: Secondary | ICD-10-CM | POA: Diagnosis not present

## 2015-07-27 DIAGNOSIS — Z6832 Body mass index (BMI) 32.0-32.9, adult: Secondary | ICD-10-CM | POA: Diagnosis not present

## 2015-07-27 DIAGNOSIS — Z0000 Encounter for general adult medical examination without abnormal findings: Secondary | ICD-10-CM | POA: Diagnosis not present

## 2015-07-27 DIAGNOSIS — I10 Essential (primary) hypertension: Secondary | ICD-10-CM | POA: Diagnosis not present

## 2015-08-01 ENCOUNTER — Ambulatory Visit (INDEPENDENT_AMBULATORY_CARE_PROVIDER_SITE_OTHER): Payer: Medicare Other | Admitting: *Deleted

## 2015-08-01 DIAGNOSIS — Z5181 Encounter for therapeutic drug level monitoring: Secondary | ICD-10-CM | POA: Diagnosis not present

## 2015-08-01 DIAGNOSIS — I482 Chronic atrial fibrillation, unspecified: Secondary | ICD-10-CM

## 2015-08-01 DIAGNOSIS — I4891 Unspecified atrial fibrillation: Secondary | ICD-10-CM

## 2015-08-01 LAB — POCT INR: INR: 1.9

## 2015-08-04 ENCOUNTER — Other Ambulatory Visit: Payer: Self-pay | Admitting: Cardiology

## 2015-08-19 ENCOUNTER — Ambulatory Visit (HOSPITAL_COMMUNITY)
Admission: RE | Admit: 2015-08-19 | Discharge: 2015-08-19 | Disposition: A | Discharge status: Home / Self Care | Payer: Medicare Other | Source: Ambulatory Visit | Attending: Internal Medicine | Admitting: Internal Medicine

## 2015-08-19 ENCOUNTER — Encounter (INDEPENDENT_AMBULATORY_CARE_PROVIDER_SITE_OTHER): Payer: Self-pay | Admitting: Internal Medicine

## 2015-08-19 ENCOUNTER — Encounter (HOSPITAL_COMMUNITY)
Admission: RE | Disposition: A | Discharge status: Home / Self Care | Payer: Self-pay | Source: Ambulatory Visit | Attending: Internal Medicine

## 2015-08-19 ENCOUNTER — Encounter (HOSPITAL_COMMUNITY): Payer: Self-pay | Admitting: *Deleted

## 2015-08-19 DIAGNOSIS — B9681 Helicobacter pylori [H. pylori] as the cause of diseases classified elsewhere: Secondary | ICD-10-CM | POA: Diagnosis not present

## 2015-08-19 DIAGNOSIS — Z79899 Other long term (current) drug therapy: Secondary | ICD-10-CM | POA: Insufficient documentation

## 2015-08-19 DIAGNOSIS — E785 Hyperlipidemia, unspecified: Secondary | ICD-10-CM | POA: Diagnosis not present

## 2015-08-19 DIAGNOSIS — Z6832 Body mass index (BMI) 32.0-32.9, adult: Secondary | ICD-10-CM | POA: Diagnosis not present

## 2015-08-19 DIAGNOSIS — Z955 Presence of coronary angioplasty implant and graft: Secondary | ICD-10-CM | POA: Diagnosis not present

## 2015-08-19 DIAGNOSIS — K2970 Gastritis, unspecified, without bleeding: Secondary | ICD-10-CM | POA: Insufficient documentation

## 2015-08-19 DIAGNOSIS — Z800 Family history of malignant neoplasm of digestive organs: Secondary | ICD-10-CM | POA: Diagnosis not present

## 2015-08-19 DIAGNOSIS — D509 Iron deficiency anemia, unspecified: Secondary | ICD-10-CM | POA: Diagnosis not present

## 2015-08-19 DIAGNOSIS — K2950 Unspecified chronic gastritis without bleeding: Secondary | ICD-10-CM | POA: Diagnosis not present

## 2015-08-19 DIAGNOSIS — K259 Gastric ulcer, unspecified as acute or chronic, without hemorrhage or perforation: Secondary | ICD-10-CM | POA: Diagnosis not present

## 2015-08-19 DIAGNOSIS — Z801 Family history of malignant neoplasm of trachea, bronchus and lung: Secondary | ICD-10-CM | POA: Insufficient documentation

## 2015-08-19 DIAGNOSIS — E669 Obesity, unspecified: Secondary | ICD-10-CM | POA: Insufficient documentation

## 2015-08-19 DIAGNOSIS — K5520 Angiodysplasia of colon without hemorrhage: Secondary | ICD-10-CM | POA: Diagnosis not present

## 2015-08-19 DIAGNOSIS — Z7901 Long term (current) use of anticoagulants: Secondary | ICD-10-CM | POA: Insufficient documentation

## 2015-08-19 DIAGNOSIS — R195 Other fecal abnormalities: Secondary | ICD-10-CM | POA: Diagnosis not present

## 2015-08-19 DIAGNOSIS — Z87891 Personal history of nicotine dependence: Secondary | ICD-10-CM | POA: Insufficient documentation

## 2015-08-19 DIAGNOSIS — I2510 Atherosclerotic heart disease of native coronary artery without angina pectoris: Secondary | ICD-10-CM | POA: Insufficient documentation

## 2015-08-19 DIAGNOSIS — N912 Amenorrhea, unspecified: Secondary | ICD-10-CM

## 2015-08-19 DIAGNOSIS — K5730 Diverticulosis of large intestine without perforation or abscess without bleeding: Secondary | ICD-10-CM | POA: Insufficient documentation

## 2015-08-19 DIAGNOSIS — D122 Benign neoplasm of ascending colon: Secondary | ICD-10-CM | POA: Diagnosis not present

## 2015-08-19 DIAGNOSIS — I10 Essential (primary) hypertension: Secondary | ICD-10-CM | POA: Insufficient documentation

## 2015-08-19 DIAGNOSIS — I4891 Unspecified atrial fibrillation: Secondary | ICD-10-CM | POA: Diagnosis not present

## 2015-08-19 DIAGNOSIS — D120 Benign neoplasm of cecum: Secondary | ICD-10-CM | POA: Diagnosis not present

## 2015-08-19 DIAGNOSIS — K228 Other specified diseases of esophagus: Secondary | ICD-10-CM | POA: Diagnosis not present

## 2015-08-19 DIAGNOSIS — K6289 Other specified diseases of anus and rectum: Secondary | ICD-10-CM | POA: Insufficient documentation

## 2015-08-19 HISTORY — PX: COLONOSCOPY: SHX5424

## 2015-08-19 HISTORY — PX: ESOPHAGOGASTRODUODENOSCOPY: SHX5428

## 2015-08-19 LAB — HEMOGLOBIN AND HEMATOCRIT, BLOOD
HCT: 36 % (ref 36.0–46.0)
HEMOGLOBIN: 11 g/dL — AB (ref 12.0–15.0)

## 2015-08-19 SURGERY — COLONOSCOPY
Anesthesia: Moderate Sedation

## 2015-08-19 MED ORDER — MEPERIDINE HCL 50 MG/ML IJ SOLN
INTRAMUSCULAR | Status: DC | PRN
  Administered 2015-08-19 (×2): 25 mg via INTRAVENOUS

## 2015-08-19 MED ORDER — MIDAZOLAM HCL 5 MG/5ML IJ SOLN
INTRAMUSCULAR | Status: DC | PRN
  Administered 2015-08-19 (×6): 1 mg via INTRAVENOUS

## 2015-08-19 MED ORDER — SODIUM CHLORIDE 0.9 % IV SOLN
INTRAVENOUS | Status: DC
  Administered 2015-08-19: 1000 mL via INTRAVENOUS

## 2015-08-19 MED ORDER — MIDAZOLAM HCL 5 MG/5ML IJ SOLN
INTRAMUSCULAR | Status: AC
  Filled 2015-08-19: qty 10

## 2015-08-19 MED ORDER — MEPERIDINE HCL 50 MG/ML IJ SOLN
INTRAMUSCULAR | Status: AC
  Filled 2015-08-19: qty 1

## 2015-08-19 MED ORDER — PANTOPRAZOLE SODIUM 40 MG PO TBEC: 40.0000 mg | DELAYED_RELEASE_TABLET | Freq: Two times a day (BID) | ORAL | Status: DC

## 2015-08-19 MED ORDER — BUTAMBEN-TETRACAINE-BENZOCAINE 2-2-14 % EX AERO
INHALATION_SPRAY | CUTANEOUS | Status: DC | PRN
  Administered 2015-08-19: 2 via TOPICAL

## 2015-08-19 MED ORDER — STERILE WATER FOR IRRIGATION IR SOLN
Status: DC | PRN
  Administered 2015-08-19: 09:00:00

## 2015-08-19 NOTE — H&P (Addendum)
[name] is an 82 y.o. female.   Chief Complaint: Patient is here for EGD and colonoscopy. HPI: Patient is 82 year old Caucasian female with multiple medical problems and is chronically anticoagulated presents with with iron deficiency anemia. She was recently hospitalized briefly received 3 units of PRBCs. She was seen in the office weeks and noted to have heme positive stool. Is also question of rectal mass. She denies chronic heartburn dysphagia or abdominal pain melena or rectal bleeding. She lost her husband in January last year and has not been eating well. She has lost 25 pounds. Her daughters and she has lost more than that. Patient has never undergone screening for CRC she has declined in the past. Family history is significant colon cancer in mother who is around 69. Warfarin on hold for 5 days.  Past Medical History  Diagnosis Date  . Coronary atherosclerosis of native coronary artery     RCA stent in 2003; residual 60% mid Cx and OM2  . Carotid artery disease ([place])     RIght CEA 05/2004  . Essential hypertension, benign   . Hyperlipidemia   . Renal artery stenosis ([facility])     Bilateral renal artery stents 2004  . Tobacco abuse, in remission     40 pack year stopped in 1989  . Atrial fibrillation ([place])   . Venous insufficiency (chronic) (peripheral)     Pedal edema  . Chronic anticoagulation   . Obesity     Past Surgical History  Procedure Laterality Date  . Colonoscopy  Never    Declines  . Carotid endarterectomy      Right in 2005, left in 2010  . Coronary angioplasty    . Appendectomy      Family History  Problem Relation Age of Onset  . Lung cancer Mother   . Cancer Mother   . Hypertension Daughter   . Hyperlipidemia Daughter   . Hypertension Son   . Hypertension Daughter   . Heart disease Maternal Grandmother    Social History:  reports that she quit smoking about 28 years ago. Her smoking use included Cigarettes. She has never used smokeless  tobacco. She reports that she does not drink alcohol or use illicit drugs.  Allergies:  Allergies  Allergen Reactions  . Diltiazem Other (See Comments)    Edema  . Verapamil Other (See Comments)    Diarrhea    Medications Prior to Admission  Medication Sig Dispense Refill  . atorvastatin (LIPITOR) 80 MG tablet Take 1 tablet (80 mg total) by mouth at bedtime. 60 tablet 6  . bisoprolol (ZEBETA) 10 MG tablet take 1 tablet by mouth twice a day 60 tablet 01  . ferrous sulfate 325 (65 FE) MG tablet Take 1 tablet (325 mg total) by mouth daily with breakfast. 30 tablet 0  . furosemide (LASIX) 40 MG tablet Take 1 tablet (40 mg total) by mouth 2 (two) times daily. 180 tablet 3  . losartan (COZAAR) 100 MG tablet take 1 tablet by mouth once daily 90 tablet 3  . polyethylene glycol-electrolytes (NULYTELY/GOLYTELY) 420 g solution Take 4,000 mLs by mouth once. 4000 mL 0  . potassium chloride SA (K-DUR,KLOR-CON) 20 MEQ tablet take 1 tablet by mouth once daily 30 tablet 11  . terazosin (HYTRIN) 5 MG capsule Take 1 capsule (5 mg total) by mouth at bedtime. 90 capsule 3  . Vitamin D, Ergocalciferol, (DRISDOL) 50000 units CAPS capsule Take 50,000 Units by mouth every 7 (seven) days.    Marland Kitchen  warfarin (COUMADIN) 5 MG tablet take 1 tablet by mouth once daily 40 tablet 3  . senna (SENOKOT) 8.6 MG TABS tablet Take 1 tablet (8.6 mg total) by mouth daily. (Patient not taking: Reported on 08/08/2015) 120 each 0    No results found for this or any previous visit (from the past 48 hour(s)). No results found.  ROS  Blood pressure 152/102, pulse 98, temperature 97.5 F (36.4 C), temperature source Oral, resp. rate 14, height 5\' 1"  (1.549 m), weight 170 lb (77.111 kg), SpO2 97 %. Physical Exam  Constitutional: She appears well-developed and well-nourished.  HENT:  Mouth/Throat: Oropharynx is clear and moist.  Eyes: Conjunctivae are normal. No scleral icterus.  Neck: No thyromegaly present.  Cardiovascular:   Irregular rhythm normal S1 and S2. No murmur or gallop noted.  Respiratory: Effort normal and breath sounds normal.  GI: Soft. She exhibits no distension and no mass. There is no tenderness.  Musculoskeletal: She exhibits no edema.  Lymphadenopathy:    She has no cervical adenopathy.  Neurological: She is alert.  Skin: Skin is warm and dry.     Assessment/Plan Iron deficiency anemia. Heme positive stool. Abnormal rectal exam. Diagnostic EGD and colonoscopy.  Rogene Houston, MD 08/19/2015, 8:59 AM

## 2015-08-19 NOTE — Op Note (Signed)
[facility] Patient Name: [name] Procedure Date: 08/19/2015 9:23 AM MRN: [id number] Date of Birth: 03/13/1933 Attending MD: Hildred Laser , MD CSN: [id number] Age: 82 Admit Type: Outpatient Procedure:                Colonoscopy Indications:              Heme positive stool, Iron deficiency anemia Providers:                Hildred Laser, MD, Gwenlyn Fudge, RN, Zoila Shutter,                            Technologist Referring MD:             Halford Chessman, MD Medicines:                Midazolam 2 mg IV Complications:            No immediate complications. Estimated Blood Loss:     Estimated blood loss was minimal. Procedure:                Pre-Anesthesia Assessment:                           - Prior to the procedure, a History and Physical                            was performed, and patient medications and                            allergies were reviewed. The patient's tolerance of                            previous anesthesia was also reviewed. The risks                            and benefits of the procedure and the sedation                            options and risks were discussed with the patient.                            All questions were answered, and informed consent                            was obtained. Prior Anticoagulants: The patient                            last took Coumadin (warfarin) 5 days prior to the                            procedure. ASA Grade Assessment: III - A patient                            with severe systemic disease. After reviewing the  risks and benefits, the patient was deemed in                            satisfactory condition to undergo the procedure.                           After obtaining informed consent, the colonoscope                            was passed under direct vision. Throughout the                            procedure, the patient's blood pressure, pulse, and          oxygen saturations were monitored continuously. The                            [id number]) scope was introduced through                            the anus and advanced to the the cecum, identified                            by appendiceal orifice and ileocecal valve. The                            colonoscopy was performed without difficulty. The                            patient tolerated the procedure well. The quality                            of the bowel preparation was adequate. The                            ileocecal valve, appendiceal orifice, and rectum                            were photographed. Scope In: Scope Out: 9:54:03 AM Scope Withdrawal Time: 0 hours 16 minutes 0 seconds  Findings:      Two sessile polyps were found in the ascending colon. The polyps were 4       to 6 mm in size. These polyps were removed with a cold snare. Resection       and retrieval were complete. To stop active bleeding, one hemostatic       clip was successfully placed (MR conditional). There was no bleeding at       the end of the procedure.      Three small angioectasias without bleeding were found in the ascending       colon.      A few medium-mouthed diverticula were found in the sigmoid colon.      Anal papilla(e) were hypertrophied. Impression:               - Two 4 to 6 mm polyps in the ascending colon,  removed with a cold snare. Resected and retrieved.                            Clip (MR conditional) was placed.                           - Three non-bleeding colonic angioectasias.                           - Diverticulosis in the sigmoid colon.                           - Anal papilla(e) were hypertrophied. Moderate Sedation:      Moderate (conscious) sedation was administered by the endoscopy nurse       and supervised by the endoscopist. The following parameters were       monitored: oxygen saturation, heart rate, blood pressure, CO2        capnography and response to care. Total physician intraservice time was       28 minutes. Recommendation:           - Patient has a contact number available for                            emergencies. The signs and symptoms of potential                            delayed complications were discussed with the                            patient. Return to normal activities tomorrow.                            Written discharge instructions were provided to the                            patient.                           - H&H today.                           - High fiber diet today.                           - Continue present medications.                           - Resume Coumadin (warfarin) at prior dose today.                            Refer to [facility] for further adjustment of                            therapy.                           - Await  pathology results.                           - Repeat colonoscopy is recommended for                            surveillance. The colonoscopy date will be                            determined after pathology results from today's                            exam become available for review. Procedure Code(s):        --- Professional ---                           [phone], Colonoscopy, flexible; with removal of                            tumor(s), polyp(s), or other lesion(s) by snare                            technique                           99152, Moderate sedation services provided by the                            same physician or other qualified health care                            professional performing the diagnostic or                            therapeutic service that the sedation supports,                            requiring the presence of an independent trained                            observer to assist in the monitoring of the                            patient's level of consciousness and physiological                             status; initial 15 minutes of intraservice time,                            patient age 45 years or older                           [phone], Moderate sedation services; each additional                            15  minutes intraservice time Diagnosis Code(s):        --- Professional ---                           D12.2, Benign neoplasm of ascending colon                           K55.20, Angiodysplasia of colon without hemorrhage                           K62.89, Other specified diseases of anus and rectum                           R19.5, Other fecal abnormalities                           D50.9, Iron deficiency anemia, unspecified                           K57.30, Diverticulosis of large intestine without                            perforation or abscess without bleeding CPT copyright 2016 American Medical Association. All rights reserved. The codes documented in this report are preliminary and upon coder review may  be revised to meet current compliance requirements. Hildred Laser, MD Hildred Laser, MD 08/19/2015 10:17:28 AM This report has been signed electronically. Number of Addenda: 0

## 2015-08-19 NOTE — Discharge Instructions (Signed)
Resume usual medications including warfarin starting this evening. Pantoprazole 40 mg by mouth 30 minutes before breakfast and evening meal daily. Remember not to take aspirin or other OTC NSAIDs but can take Tylenol up to 2 g per day on as-needed basis. No driving for 24 hours. Please get INR checked in 7-10 days. Physician will call with biopsy results. Office visit in 3 months.      Colonoscopy, Care After These instructions give you information on caring for yourself after your procedure. Your doctor may also give you more specific instructions. Call your doctor if you have any problems or questions after your procedure. HOME CARE  Do not drive for 24 hours.  Do not sign important papers or use machinery for 24 hours.  You may shower.  You may go back to your usual activities, but go slower for the first 24 hours.  Take rest breaks often during the first 24 hours.  Walk around or use warm packs on your belly (abdomen) if you have belly cramping or gas.  Drink enough fluids to keep your pee (urine) clear or pale yellow.  Resume your normal diet. Avoid heavy or fried foods.  Avoid drinking alcohol for 24 hours or as told by your doctor.  Only take medicines as told by your doctor. If a tissue sample (biopsy) was taken during the procedure:   Do not take aspirin or blood thinners for 7 days, or as told by your doctor.  Do not drink alcohol for 7 days, or as told by your doctor.  Eat soft foods for the first 24 hours. GET HELP IF: You still have a small amount of blood in your poop (stool) 2-3 days after the procedure. GET HELP RIGHT AWAY IF:  You have more than a small amount of blood in your poop.  You see clumps of tissue (blood clots) in your poop.  Your belly is puffy (swollen).  You feel sick to your stomach (nauseous) or throw up (vomit).  You have a fever.  You have belly pain that gets worse and medicine does not help. MAKE SURE YOU:  Understand  these instructions.  Will watch your condition.  Will get help right away if you are not doing well or get worse.   This information is not intended to replace advice given to you by your health care provider. Make sure you discuss any questions you have with your health care provider.   Document Released: 05/05/2010 Document Revised: 04/07/2013 Document Reviewed: 12/08/2012 Elsevier Interactive Patient Education 2016 [employer].   Esophagogastroduodenoscopy, Care After Refer to this sheet in the next few weeks. These instructions provide you with information about caring for yourself after your procedure. Your health care provider may also give you more specific instructions. Your treatment has been planned according to current medical practices, but problems sometimes occur. Call your health care provider if you have any problems or questions after your procedure. WHAT TO EXPECT AFTER THE PROCEDURE After your procedure, it is typical to feel:  Soreness in your throat.  Pain with swallowing.  Sick to your stomach (nauseous).  Bloated.  Dizzy.  Fatigued. HOME CARE INSTRUCTIONS  Do not eat or drink anything until the numbing medicine (local anesthetic) has worn off and your gag reflex has returned. You will know that the local anesthetic has worn off when you can swallow comfortably.  Do not drive or operate machinery until directed by your health care provider.  Take medicines only as directed by your  health care provider. SEEK MEDICAL CARE IF:   You cannot stop coughing.  You are not urinating at all or less than usual. SEEK IMMEDIATE MEDICAL CARE IF:  You have difficulty swallowing.  You cannot eat or drink.  You have worsening throat or chest pain.  You have dizziness or lightheadedness or you faint.  You have nausea or vomiting.  You have chills.  You have a fever.  You have severe abdominal pain.  You have black, tarry, or bloody stools.   This  information is not intended to replace advice given to you by your health care provider. Make sure you discuss any questions you have with your health care provider.   Document Released: 03/19/2012 Document Revised: 04/23/2014 Document Reviewed: 03/19/2012 Elsevier Interactive Patient Education 2016 Elsevier Inc.   Colon Polyps Polyps are lumps of extra tissue growing inside the body. Polyps can grow in the large intestine (colon). Most colon polyps are noncancerous (benign). However, some colon polyps can become cancerous over time. Polyps that are larger than a pea may be harmful. To be safe, caregivers remove and test all polyps. CAUSES  Polyps form when mutations in the genes cause your cells to grow and divide even though no more tissue is needed. RISK FACTORS There are a number of risk factors that can increase your chances of getting colon polyps. They include:  Being older than 50 years.  Family history of colon polyps or colon cancer.  Long-term colon diseases, such as colitis or Crohn disease.  Being overweight.  Smoking.  Being inactive.  Drinking too much alcohol. SYMPTOMS  Most small polyps do not cause symptoms. If symptoms are present, they may include:  Blood in the stool. The stool may look dark red or black.  Constipation or diarrhea that lasts longer than 1 week. DIAGNOSIS People often do not know they have polyps until their caregiver finds them during a regular checkup. Your caregiver can use 4 tests to check for polyps:  Digital rectal exam. The caregiver wears gloves and feels inside the rectum. This test would find polyps only in the rectum.  Barium enema. The caregiver puts a liquid called barium into your rectum before taking X-rays of your colon. Barium makes your colon look white. Polyps are dark, so they are easy to see in the X-ray pictures.  Sigmoidoscopy. A thin, flexible tube (sigmoidoscope) is placed into your rectum. The sigmoidoscope has a  light and tiny camera in it. The caregiver uses the sigmoidoscope to look at the last third of your colon.  Colonoscopy. This test is like sigmoidoscopy, but the caregiver looks at the entire colon. This is the most common method for finding and removing polyps. TREATMENT  Any polyps will be removed during a sigmoidoscopy or colonoscopy. The polyps are then tested for cancer. PREVENTION  To help lower your risk of getting more colon polyps:  Eat plenty of fruits and vegetables. Avoid eating fatty foods.  Do not smoke.  Avoid drinking alcohol.  Exercise every day.  Lose weight if recommended by your caregiver.  Eat plenty of calcium and folate. Foods that are rich in calcium include milk, cheese, and broccoli. Foods that are rich in folate include chickpeas, kidney beans, and spinach. HOME CARE INSTRUCTIONS Keep all follow-up appointments as directed by your caregiver. You may need periodic exams to check for polyps. SEEK MEDICAL CARE IF: You notice bleeding during a bowel movement.   This information is not intended to replace advice given to you  by your health care provider. Make sure you discuss any questions you have with your health care provider.   Document Released: 12/28/2003 Document Revised: 04/23/2014 Document Reviewed: 06/12/2011 Elsevier Interactive Patient Education 2016 Elsevier Inc.  Peptic Ulcer A peptic ulcer is a painful sore in the lining of your esophagus, stomach, or in the first part of your small intestine. The main causes of an ulcer can be:  An infection.  Using certain pain medicines too often or too much.  Smoking. HOME CARE  Avoid smoking, alcohol, and caffeine.  Avoid foods that bother you.  Only take medicine as told by your doctor. Do not take any medicines your doctor has not approved.  Keep all doctor visits as told. GET HELP IF:  You do not get better in 7 days after starting treatment.  You keep having an upset stomach  (indigestion) or heartburn. GET HELP RIGHT AWAY IF:  You have sudden, sharp, or lasting belly (abdominal) pain.  You have bloody, black, or tarry poop (stool).  You throw up (vomit) blood or your throw up looks like coffee grounds.  You get light-headed, weak, or feel like you will pass out (faint).  You get sweaty or feel sticky and cold to the touch (clammy). MAKE SURE YOU:   Understand these instructions.  Will watch your condition.  Will get help right away if you are not doing well or get worse.   This information is not intended to replace advice given to you by your health care provider. Make sure you discuss any questions you have with your health care provider.   Document Released: 06/27/2009 Document Revised: 04/23/2014 Document Reviewed: 10/31/2011 Elsevier Interactive Patient Education [employer].   Diverticulosis Diverticulosis is the condition that develops when small pouches (diverticula) form in the wall of your colon. Your colon, or large intestine, is where water is absorbed and stool is formed. The pouches form when the inside layer of your colon pushes through weak spots in the outer layers of your colon. CAUSES  No one knows exactly what causes diverticulosis. RISK FACTORS  Being older than 61. Your risk for this condition increases with age. Diverticulosis is rare in people younger than 40 years. By age 58, almost everyone has it.  Eating a low-fiber diet.  Being frequently constipated.  Being overweight.  Not getting enough exercise.  Smoking.  Taking over-the-counter pain medicines, like aspirin and ibuprofen. SYMPTOMS  Most people with diverticulosis do not have symptoms. DIAGNOSIS  Because diverticulosis often has no symptoms, health care providers often discover the condition during an exam for other colon problems. In many cases, a health care provider will diagnose diverticulosis while using a flexible scope to examine the colon  (colonoscopy). TREATMENT  If you have never developed an infection related to diverticulosis, you may not need treatment. If you have had an infection before, treatment may include:  Eating more fruits, vegetables, and grains.  Taking a fiber supplement.  Taking a live bacteria supplement (probiotic).  Taking medicine to relax your colon. HOME CARE INSTRUCTIONS   Drink at least 6-8 glasses of water each day to prevent constipation.  Try not to strain when you have a bowel movement.  Keep all follow-up appointments. If you have had an infection before:  Increase the fiber in your diet as directed by your health care provider or dietitian.  Take a dietary fiber supplement if your health care provider approves.  Only take medicines as directed by your health care provider.  SEEK MEDICAL CARE IF:   You have abdominal pain.  You have bloating.  You have cramps.  You have not gone to the bathroom in 3 days. SEEK IMMEDIATE MEDICAL CARE IF:   Your pain gets worse.  Yourbloating becomes very bad.  You have a fever or chills, and your symptoms suddenly get worse.  You begin vomiting.  You have bowel movements that are bloody or black. MAKE SURE YOU:  Understand these instructions.  Will watch your condition.  Will get help right away if you are not doing well or get worse.   This information is not intended to replace advice given to you by your health care provider. Make sure you discuss any questions you have with your health care provider.   Document Released: 12/29/2003 Document Revised: 04/07/2013 Document Reviewed: 02/25/2013 Elsevier Interactive Patient Education [employer].

## 2015-08-19 NOTE — Op Note (Signed)
[facility] Patient Name: [name] Procedure Date: 08/19/2015 8:26 AM MRN: [id number] Date of Birth: 1932-08-07 Attending MD: Hildred Laser , MD CSN: [id number] Age: 82 Admit Type: Outpatient Procedure:                Upper GI endoscopy Indications:              Iron deficiency anemia, Heme positive stool Providers:                Hildred Laser, MD, Gwenlyn Fudge, RN, Zoila Shutter,                            Technologist Referring MD:             Purvis Kilts, MD Medicines:                Cetacaine spray, Meperidine 50 mg IV, Midazolam 4                            mg IV Complications:            No immediate complications. Estimated Blood Loss:     Estimated blood loss was minimal. Procedure:                Pre-Anesthesia Assessment:                           - Prior to the procedure, a History and Physical                            was performed, and patient medications and                            allergies were reviewed. The patient's tolerance of                            previous anesthesia was also reviewed. The risks                            and benefits of the procedure and the sedation                            options and risks were discussed with the patient.                            All questions were answered, and informed consent                            was obtained. Prior Anticoagulants: The patient                            last took Coumadin (warfarin) 5 days prior to the                            procedure. ASA Grade Assessment: III - A patient  with severe systemic disease. After reviewing the                            risks and benefits, the patient was deemed in                            satisfactory condition to undergo the procedure.                           After obtaining informed consent, the endoscope was                            passed under direct vision. Throughout the    procedure, the patient's blood pressure, pulse, and                            oxygen saturations were monitored continuously. The                            [id number] scope was introduced through the                            mouth, and advanced to the second part of duodenum.                            The upper GI endoscopy was accomplished without                            difficulty. The patient tolerated the procedure                            well. Scope In: Scope Out: Findings:      The examined esophagus was normal.      The Z-line was irregular and was found 38 cm from the incisors.      One non-bleeding superficial gastric ulcer was found at the incisura.       The lesion was 5 mm in largest dimension. Biopsies were taken with a       cold forceps for histology.      Scattered moderate inflammation characterized by erosions was found in       the gastric antrum.      The cardia and gastric fundus were normal on retroflexion.      The duodenal bulb and second portion of the duodenum were normal. Impression:               - Normal esophagus.                           - Z-line irregular, 38 cm from the incisors.                           - Non-bleeding gastric ulcer. Biopsied.                           - Chronic gastritis.                           -  Normal duodenal bulb and second portion of the                            duodenum. Moderate Sedation:      Moderate (conscious) sedation was administered by the endoscopy nurse       and supervised by the endoscopist. The following parameters were       monitored: oxygen saturation, heart rate, blood pressure, CO2       capnography and response to care. Total physician intraservice time was       17 minutes. Recommendation:           - Patient has a contact number available for                            emergencies. The signs and symptoms of potential                            delayed complications were discussed  with the                            patient. Return to normal activities tomorrow.                            Written discharge instructions were provided to the                            patient.                           - High fiber diet today.                           - Continue present medications.                           - Resume Coumadin (warfarin) at prior dose today.                            Refer to Coumadin Clinic for further adjustment of                            therapy.                           - Await pathology results.                           - Repeat upper endoscopy in 3 months to check                            healing. Procedure Code(s):        --- Professional ---                           [phone], Esophagogastroduodenoscopy, flexible,  transoral; with biopsy, single or multiple                           99152, Moderate sedation services provided by the                            same physician or other qualified health care                            professional performing the diagnostic or                            therapeutic service that the sedation supports,                            requiring the presence of an independent trained                            observer to assist in the monitoring of the                            patient's level of consciousness and physiological                            status; initial 15 minutes of intraservice time,                            patient age 49 years or older Diagnosis Code(s):        --- Professional ---                           K22.8, Other specified diseases of esophagus                           K25.9, Gastric ulcer, unspecified as acute or                            chronic, without hemorrhage or perforation                           K29.50, Unspecified chronic gastritis without                            bleeding                           D50.9, Iron deficiency  anemia, unspecified                           R19.5, Other fecal abnormalities CPT copyright 2016 American Medical Association. All rights reserved. The codes documented in this report are preliminary and upon coder review may  be revised to meet current compliance requirements. Hildred Laser, MD Hildred Laser, MD 08/19/2015 10:11:29 AM This report has been signed electronically. Number of Addenda: 0

## 2015-08-23 ENCOUNTER — Encounter (HOSPITAL_COMMUNITY): Payer: Self-pay | Admitting: Internal Medicine

## 2015-08-24 ENCOUNTER — Ambulatory Visit (INDEPENDENT_AMBULATORY_CARE_PROVIDER_SITE_OTHER): Payer: Medicare Other | Admitting: *Deleted

## 2015-08-24 DIAGNOSIS — I4891 Unspecified atrial fibrillation: Secondary | ICD-10-CM | POA: Diagnosis not present

## 2015-08-24 DIAGNOSIS — I482 Chronic atrial fibrillation, unspecified: Secondary | ICD-10-CM

## 2015-08-24 DIAGNOSIS — Z5181 Encounter for therapeutic drug level monitoring: Secondary | ICD-10-CM

## 2015-08-24 LAB — POCT INR: INR: 2.3

## 2015-08-28 ENCOUNTER — Other Ambulatory Visit: Payer: Self-pay | Admitting: Cardiology

## 2015-09-08 ENCOUNTER — Other Ambulatory Visit: Payer: Self-pay | Admitting: Cardiology

## 2015-09-21 ENCOUNTER — Ambulatory Visit (INDEPENDENT_AMBULATORY_CARE_PROVIDER_SITE_OTHER): Payer: Medicare Other | Admitting: *Deleted

## 2015-09-21 DIAGNOSIS — I4891 Unspecified atrial fibrillation: Secondary | ICD-10-CM

## 2015-09-21 DIAGNOSIS — Z5181 Encounter for therapeutic drug level monitoring: Secondary | ICD-10-CM

## 2015-09-21 DIAGNOSIS — I482 Chronic atrial fibrillation, unspecified: Secondary | ICD-10-CM

## 2015-09-21 LAB — POCT INR: INR: 3.1

## 2015-09-26 DIAGNOSIS — H25813 Combined forms of age-related cataract, bilateral: Secondary | ICD-10-CM | POA: Diagnosis not present

## 2015-11-02 ENCOUNTER — Ambulatory Visit (INDEPENDENT_AMBULATORY_CARE_PROVIDER_SITE_OTHER): Payer: Medicare Other | Admitting: *Deleted

## 2015-11-02 ENCOUNTER — Encounter: Payer: Self-pay | Admitting: Cardiology

## 2015-11-02 ENCOUNTER — Ambulatory Visit (INDEPENDENT_AMBULATORY_CARE_PROVIDER_SITE_OTHER): Payer: Medicare Other | Admitting: Cardiology

## 2015-11-02 VITALS — BP 118/58 | HR 59 | Ht 62.0 in | Wt 176.8 lb

## 2015-11-02 DIAGNOSIS — R600 Localized edema: Secondary | ICD-10-CM | POA: Diagnosis not present

## 2015-11-02 DIAGNOSIS — I482 Chronic atrial fibrillation, unspecified: Secondary | ICD-10-CM

## 2015-11-02 DIAGNOSIS — Z79899 Other long term (current) drug therapy: Secondary | ICD-10-CM | POA: Diagnosis not present

## 2015-11-02 DIAGNOSIS — I4891 Unspecified atrial fibrillation: Secondary | ICD-10-CM

## 2015-11-02 DIAGNOSIS — I10 Essential (primary) hypertension: Secondary | ICD-10-CM | POA: Diagnosis not present

## 2015-11-02 DIAGNOSIS — I6523 Occlusion and stenosis of bilateral carotid arteries: Secondary | ICD-10-CM

## 2015-11-02 DIAGNOSIS — I2510 Atherosclerotic heart disease of native coronary artery without angina pectoris: Secondary | ICD-10-CM | POA: Diagnosis not present

## 2015-11-02 DIAGNOSIS — Z5181 Encounter for therapeutic drug level monitoring: Secondary | ICD-10-CM

## 2015-11-02 LAB — POCT INR: INR: 2.6

## 2015-11-02 MED ORDER — FUROSEMIDE 40 MG PO TABS: ORAL_TABLET | ORAL | Status: DC

## 2015-11-02 NOTE — Progress Notes (Signed)
Cardiology Office Note  Date: 11/02/2015   ID: [name], DOB Mar 27, 1933, MRN [id number]  PCP: Purvis Kilts, MD  Primary Cardiologist: Rozann Lesches, MD   Chief Complaint  Patient presents with  . Coronary Artery Disease    History of Present Illness: [name] is an 82 y.o. female last seen in April. She presents for a routine follow-up visit. Overall reports no major change in stamina, back to doing her typical ADLs, reports NYHA class II dyspnea and no chest tightness. She has had more leg edema and venous stasis in the last month or so. States that she has been eating better, perhaps increased salt intake. She has tried compression stockings and reports that this has not helped very much. Generally her leg swelling is better in the mornings after her feet have been up all night.  I reviewed her medications. We discussed her diuretics will try and increase Lasix a little bit more.   She continues on Coumadin with follow-up in the anticoagulation clinic. Does not report any obvious bleeding. Recent INR is 2.6.  Past Medical History  Diagnosis Date  . Coronary atherosclerosis of native coronary artery     RCA stent in 2003; residual 60% mid Cx and OM2  . Carotid artery disease ([place])     RIght CEA 05/2004  . Essential hypertension, benign   . Hyperlipidemia   . Renal artery stenosis ([facility])     Bilateral renal artery stents 2004  . Tobacco abuse, in remission     40 pack year stopped in 1989  . Atrial fibrillation ([place])   . Venous insufficiency (chronic) (peripheral)     Pedal edema  . Chronic anticoagulation   . Obesity     Past Surgical History  Procedure Laterality Date  . Colonoscopy  Never    Declines  . Carotid endarterectomy      Right in 2005, left in 2010  . Coronary angioplasty    . Appendectomy    . Colonoscopy N/A 08/19/2015    Procedure: COLONOSCOPY;  Surgeon: Rogene Houston, MD;  Location: [facility];  Service: Endoscopy;   Laterality: N/A;  7:30  . Esophagogastroduodenoscopy N/A 08/19/2015    Procedure: ESOPHAGOGASTRODUODENOSCOPY (EGD);  Surgeon: Rogene Houston, MD;  Location: [facility];  Service: Endoscopy;  Laterality: N/A;    Current Outpatient Prescriptions  Medication Sig Dispense Refill  . atorvastatin (LIPITOR) 80 MG tablet Take 1 tablet (80 mg total) by mouth at bedtime. 60 tablet 6  . bisoprolol (ZEBETA) 10 MG tablet take 1 tablet twice a day 60 tablet 6  . losartan (COZAAR) 100 MG tablet take 1 tablet by mouth once daily 90 tablet 3  . potassium chloride SA (K-DUR,KLOR-CON) 20 MEQ tablet take 1 tablet by mouth once daily 30 tablet 11  . terazosin (HYTRIN) 5 MG capsule take 1 capsule by mouth at bedtime 90 capsule 3  . warfarin (COUMADIN) 5 MG tablet take 1 tablet by mouth once daily 40 tablet 3  . furosemide (LASIX) 40 MG tablet TAKE 60 MG (1 1/2 PILLS) IN THE AM,  TAKE 40 MG - 1 PILL IN PM 135 tablet 3   No current facility-administered medications for this visit.   Allergies:  Diltiazem and Verapamil   Social History: The patient  reports that she quit smoking about 28 years ago. Her smoking use included Cigarettes. She has never used smokeless tobacco. She reports that she does not drink alcohol or use illicit  drugs.   ROS:  Please see the history of present illness. Otherwise, complete review of systems is positive for mild fatigue in the afternoons.  All other systems are reviewed and negative.   Physical Exam: VS:  BP 118/58 mmHg  Pulse 59  Ht 5\' 2"  (1.575 m)  Wt 176 lb 12.8 oz (80.196 kg)  BMI 32.33 kg/m2  SpO2 97%, BMI Body mass index is 32.33 kg/(m^2).  Wt Readings from Last 3 Encounters:  11/02/15 176 lb 12.8 oz (80.196 kg)  08/19/15 170 lb (77.111 kg)  07/21/15 174 lb (78.926 kg)    Pleasant elderly woman, no distress.  HEENT: Conjunctiva and lids normal, oropharynx clear.  Neck: Supple, no elevated JVP or carotid bruits, no thyromegaly.  Lungs: Clear to auscultation,  nonlabored breathing at rest.  Cardiac: Irregularly irregular, no S3 or significant systolic murmur, no pericardial rub.  Abdomen: Soft, nontender,bowel sounds present.  Extremities: 2+ bilateral leg swelling and venous stasis, distal pulses 2+.  Skin: Warm and dry.  Musculoskeletal: No kyphosis.  Neuropsychiatric: Alert and oriented x3, affect grossly appropriate.  ECG: I personally reviewed the tracing from 07/11/2015 which showed atrial fibrillation at 95 bpm with diffuse repolarization abnormalities.  Recent Labwork: 07/11/2015: ALT 13*; AST 21 07/13/2015: BUN 16; Creatinine, Ser 1.01*; Platelets 182; Potassium 3.7; Sodium 140 08/19/2015: Hemoglobin 11.0*     Component Value Date/Time   CHOL 91* 12/22/2014 1035   TRIG 79 12/22/2014 1035   HDL 34* 12/22/2014 1035   CHOLHDL 2.7 12/22/2014 1035   VLDL 16 12/22/2014 1035   LDLCALC 41 12/22/2014 1035    Other Studies Reviewed Today:  Echocardiogram 07/07/2015: Study Conclusions  - Left ventricle: The cavity size was normal. Wall thickness was  increased in a pattern of mild LVH. Systolic function was normal.  The estimated ejection fraction was in the range of 60% to 65%. - Mitral valve: There was mild regurgitation. - Left atrium: The atrium was mildly dilated. - Right ventricle: The cavity size was moderately dilated. - Right atrium: The atrium was moderately dilated. - Atrial septum: No defect or patent foramen ovale was identified. - Tricuspid valve: There was moderate regurgitation. - Pulmonary arteries: PA peak pressure: 42 mm Hg (S).  Assessment and Plan:  1. Progressive bilateral leg edema and venous stasis. We will increase Lasix to 60 mg in the morning and 40 mg in the evening. Follow-up BMET in 2 weeks. Discussed limited salt intake, also elevating her legs. She states that compression stockings were not helpful.  2. Symptomatically stable CAD status post prior RCA stenting in 2003 with moderate residual  circumflex and OM 2 disease. She is not reporting any active angina at this time.  3. Essential hypertension, blood pressure is well controlled today.  4. Chronic atrial fibrillation, continue strategy of heart rate control and anticoagulation. She continues on Coumadin with follow-up in the anticoagulation clinic. No obvious recurrent bleeding problems.  Current medicines were reviewed with the patient today.   Orders Placed This Encounter  Procedures  . Basic Metabolic Panel (BMET)    Disposition: Follow-up with me in 3 months.  Signed, Satira Sark, MD, [facility] 11/02/2015 1:53 PM    [facility] [address], [place], [place] [address] Phone: [phone]; Fax: [phone]

## 2015-11-02 NOTE — Patient Instructions (Signed)
Your physician recommends that you schedule a follow-up appointment in: 3 months     INCREASE Lasix to 60 mg  1 1/2 pills am, and 40 mg in the pm    Get lab work in 2 weeks: BMET      Thank you for choosing [facility] !

## 2015-11-16 DIAGNOSIS — Z79899 Other long term (current) drug therapy: Secondary | ICD-10-CM | POA: Diagnosis not present

## 2015-11-17 LAB — BASIC METABOLIC PANEL
BUN: 20 mg/dL (ref 7–25)
CALCIUM: 9.5 mg/dL (ref 8.6–10.4)
CO2: 28 mmol/L (ref 20–31)
CREATININE: 0.99 mg/dL — AB (ref 0.60–0.88)
Chloride: 104 mmol/L (ref 98–110)
Glucose, Bld: 95 mg/dL (ref 65–99)
Potassium: 4.8 mmol/L (ref 3.5–5.3)
SODIUM: 140 mmol/L (ref 135–146)

## 2015-11-22 ENCOUNTER — Encounter (INDEPENDENT_AMBULATORY_CARE_PROVIDER_SITE_OTHER): Payer: Self-pay | Admitting: Internal Medicine

## 2015-11-22 ENCOUNTER — Ambulatory Visit (INDEPENDENT_AMBULATORY_CARE_PROVIDER_SITE_OTHER): Payer: Medicare Other | Admitting: Internal Medicine

## 2015-11-22 VITALS — BP 100/68 | HR 64 | Temp 98.0°F | Resp 18 | Ht 62.0 in | Wt 176.1 lb

## 2015-11-22 DIAGNOSIS — D509 Iron deficiency anemia, unspecified: Secondary | ICD-10-CM | POA: Diagnosis not present

## 2015-11-22 DIAGNOSIS — B9681 Helicobacter pylori [H. pylori] as the cause of diseases classified elsewhere: Secondary | ICD-10-CM

## 2015-11-22 DIAGNOSIS — I6523 Occlusion and stenosis of bilateral carotid arteries: Secondary | ICD-10-CM

## 2015-11-22 DIAGNOSIS — K279 Peptic ulcer, site unspecified, unspecified as acute or chronic, without hemorrhage or perforation: Secondary | ICD-10-CM

## 2015-11-22 DIAGNOSIS — K2970 Gastritis, unspecified, without bleeding: Secondary | ICD-10-CM

## 2015-11-22 MED ORDER — PANTOPRAZOLE SODIUM 40 MG PO TBEC: 40.0000 mg | DELAYED_RELEASE_TABLET | Freq: Two times a day (BID) | ORAL | 1 refills | Status: DC

## 2015-11-22 MED ORDER — BIS SUBCIT-METRONID-TETRACYC 140-125-125 MG PO CAPS: 3.0000 | ORAL_CAPSULE | Freq: Three times a day (TID) | ORAL | 0 refills | Status: DC

## 2015-11-22 NOTE — Progress Notes (Signed)
Presenting complaint;  Follow-up for peptic ulcer disease and iron deficiency anemia.  Database and Subjective:  Patient is 82 year old Caucasian female who was evaluated for iron deficiency anemia and heme positive stool. She was recently hospitalized and received 3 units of PRBCs. She is chronically anticoagulated because of history of atrial fibrillation. She had been using Advil on as-needed basis but not recently. She underwent EGD and colonoscopy in 08/19/2015. She was noted to have small gastric ulcer. Biopsy revealed benign etiology and H. pylori gastritis. She had 2 small polyps removed from her colon and one was tubular adenoma. Patient was discharged on pantoprazole. It was decided to delay H. pylori therapy until office visit.  Patient returned for scheduled visit accompanied by her daughter. She has no complaints. She has very good appetite. She denies nausea vomiting heartburn dysphagia or abdominal pain. She has as informants stools. She denies melena or rectal bleeding. She is not sure for how long she took pantoprazole. Her pharmacy was called and she only filled the prescription once. She has not had CBC recently.   Current Medications: Outpatient Encounter Prescriptions as of 11/22/2015  Medication Sig  . atorvastatin (LIPITOR) 80 MG tablet Take 1 tablet (80 mg total) by mouth at bedtime.  . bisoprolol (ZEBETA) 10 MG tablet take 1 tablet twice a day  . furosemide (LASIX) 40 MG tablet TAKE 60 MG (1 1/2 PILLS) IN THE AM,  TAKE 40 MG - 1 PILL IN PM  . losartan (COZAAR) 100 MG tablet take 1 tablet by mouth once daily  . potassium chloride SA (K-DUR,KLOR-CON) 20 MEQ tablet take 1 tablet by mouth once daily  . terazosin (HYTRIN) 5 MG capsule take 1 capsule by mouth at bedtime  . warfarin (COUMADIN) 5 MG tablet take 1 tablet by mouth once daily   No facility-administered encounter medications on file as of 11/22/2015.      Objective: Blood pressure 100/68, pulse 64, temperature 98  F (36.7 C), temperature source Oral, resp. rate 18, height 5\' 2"  (1.575 m), weight 176 lb 1.6 oz (79.9 kg). Patient is alert and in no acute distress. Conjunctiva is pink. Sclera is nonicteric Oropharyngeal mucosa is normal. No neck masses or thyromegaly noted. Cardiac exam with irregular rhythm normal S1 and S2. No murmur or gallop noted. Lungs are clear to auscultation. Abdomen is full but soft and nontender without organomegaly or masses. Patient has pitting and nonpitting pretibial edema.  Labs/studies Results: Gastric biopsy from 08/19/2015 revealed H. pylori gastritis and no evidence of dysplasia.   H&H was 11 and 36.0 on 08/19/2015.    Assessment:  #1. Peptic ulcer disease. She was found to have small gastric ulcer on EGD 3 months ago. She is not completed PPI therapy as recommended. Endoscopically ulcer appeared to be benign. Will hold off follow-up EGD since she has not completed PPI therapy. #2. H. pylori gastritis. She has not been treated yet. Patient's anticoagulant dose will have to be adjusted because of interaction with antibiotics. #3. History of iron deficiency anemia. She did receive 3 units of PRBCs in March this year. Hemoglobin was 11 g 3 months ago. Will check H&H and decide if she needs to be on by mouth iron.  Plan:  Hemoccult 1. Patient advised to go back on pantoprazole at 40 mg by mouth twice a day. Will treat her with Pylera 3 capsules by mouth 4 times a day for 10 days. She is instructed to drop warfarin dose by 50% when Pylera started. Will get  assistance from Coumadin clinic to manage INR while she is on antibiotics. Patient will go to the lab for H&H. Once again patient advised not to take OTC NSAIDs. Office visit in 3 months.

## 2015-11-22 NOTE — Patient Instructions (Signed)
Hemoccult 1. Reduce warfarin dose by half while on Pylera. Will arrange for INR to be checked ahead of schedule while on Pylera.

## 2015-11-23 ENCOUNTER — Encounter (INDEPENDENT_AMBULATORY_CARE_PROVIDER_SITE_OTHER): Payer: Self-pay | Admitting: Internal Medicine

## 2015-11-23 ENCOUNTER — Telehealth (INDEPENDENT_AMBULATORY_CARE_PROVIDER_SITE_OTHER): Payer: Self-pay | Admitting: *Deleted

## 2015-11-23 ENCOUNTER — Other Ambulatory Visit (INDEPENDENT_AMBULATORY_CARE_PROVIDER_SITE_OTHER): Payer: Self-pay | Admitting: *Deleted

## 2015-11-23 DIAGNOSIS — B9681 Helicobacter pylori [H. pylori] as the cause of diseases classified elsewhere: Secondary | ICD-10-CM

## 2015-11-23 DIAGNOSIS — K2970 Gastritis, unspecified, without bleeding: Secondary | ICD-10-CM

## 2015-11-23 DIAGNOSIS — D509 Iron deficiency anemia, unspecified: Secondary | ICD-10-CM

## 2015-11-23 DIAGNOSIS — K279 Peptic ulcer, site unspecified, unspecified as acute or chronic, without hemorrhage or perforation: Secondary | ICD-10-CM

## 2015-11-23 LAB — HEMOGLOBIN AND HEMATOCRIT, BLOOD
HCT: 30.3 % — ABNORMAL LOW (ref 35.0–45.0)
HEMOGLOBIN: 9.1 g/dL — AB (ref 11.7–15.5)

## 2015-11-23 NOTE — Telephone Encounter (Signed)
   Diagnosis:    Result(s)   Card 1:: Negative:         Completed by:    HEMOCCULT SENSA DEVELOPER: LOT#:[id number]   EXPIRATION DATE: 9-17   HEMOCCULT SENSA CARD:  LOT#:[id number]   EXPIRATION DATE: 07/18   CARD CONTROL RESULTS:  POSITIVE:Positive  NEGATIVE: Negative    ADDITIONAL COMMENTS: Patient was called and given this result. Awaiting lab results.

## 2015-11-24 ENCOUNTER — Encounter: Payer: Self-pay | Admitting: Family

## 2015-11-24 ENCOUNTER — Encounter (INDEPENDENT_AMBULATORY_CARE_PROVIDER_SITE_OTHER): Payer: Self-pay | Admitting: *Deleted

## 2015-11-24 ENCOUNTER — Other Ambulatory Visit (INDEPENDENT_AMBULATORY_CARE_PROVIDER_SITE_OTHER): Payer: Self-pay | Admitting: *Deleted

## 2015-11-24 ENCOUNTER — Telehealth: Payer: Self-pay | Admitting: *Deleted

## 2015-11-24 DIAGNOSIS — D649 Anemia, unspecified: Secondary | ICD-10-CM

## 2015-11-24 NOTE — Telephone Encounter (Signed)
Stool is quite negative.  

## 2015-11-24 NOTE — Telephone Encounter (Signed)
Spoke with pt.  She has been started on Pylera for H Pylori by Dr Laural Golden.  Agree with Dr Olevia Perches instructions for pt to decrease coumadin dosage by 50%.  Will recheck INR 11/30/15 and adjust coumadin as needed.

## 2015-11-24 NOTE — Telephone Encounter (Signed)
Patient was started on new medication by Dr.Rehman and he suggested she cut her Coumadin in 1/2. Please call with instructions. / tg

## 2015-11-25 ENCOUNTER — Telehealth (INDEPENDENT_AMBULATORY_CARE_PROVIDER_SITE_OTHER): Payer: Self-pay | Admitting: *Deleted

## 2015-11-25 NOTE — Telephone Encounter (Signed)
[name] called the office this morning. She says that she started the Pylera this morning. She took 1/2 Coumadin this morning and says that Lattie Haw had called her yesterday afternoon.  Patient has also started the [employer] with Iron.

## 2015-11-29 ENCOUNTER — Encounter: Payer: Self-pay | Admitting: Family

## 2015-11-29 ENCOUNTER — Ambulatory Visit (INDEPENDENT_AMBULATORY_CARE_PROVIDER_SITE_OTHER): Payer: Medicare Other | Admitting: Family

## 2015-11-29 ENCOUNTER — Ambulatory Visit (HOSPITAL_COMMUNITY)
Admission: RE | Admit: 2015-11-29 | Discharge: 2015-11-29 | Disposition: A | Discharge status: Home / Self Care | Payer: Medicare Other | Source: Ambulatory Visit | Attending: Family | Admitting: Family

## 2015-11-29 VITALS — BP 130/68 | HR 79 | Temp 97.1°F | Resp 16 | Ht 60.0 in | Wt 172.0 lb

## 2015-11-29 DIAGNOSIS — I2510 Atherosclerotic heart disease of native coronary artery without angina pectoris: Secondary | ICD-10-CM | POA: Diagnosis not present

## 2015-11-29 DIAGNOSIS — Z48812 Encounter for surgical aftercare following surgery on the circulatory system: Secondary | ICD-10-CM | POA: Diagnosis not present

## 2015-11-29 DIAGNOSIS — I10 Essential (primary) hypertension: Secondary | ICD-10-CM | POA: Diagnosis not present

## 2015-11-29 DIAGNOSIS — I6523 Occlusion and stenosis of bilateral carotid arteries: Secondary | ICD-10-CM | POA: Diagnosis not present

## 2015-11-29 DIAGNOSIS — Z9889 Other specified postprocedural states: Secondary | ICD-10-CM | POA: Diagnosis not present

## 2015-11-29 DIAGNOSIS — Z87891 Personal history of nicotine dependence: Secondary | ICD-10-CM | POA: Diagnosis not present

## 2015-11-29 DIAGNOSIS — E785 Hyperlipidemia, unspecified: Secondary | ICD-10-CM | POA: Diagnosis not present

## 2015-11-29 LAB — VAS US CAROTID
LCCADDIAS: -13 cm/s
LCCADSYS: -93 cm/s
LICADSYS: -83 cm/s
Left CCA prox dias: 13 cm/s
Left CCA prox sys: 76 cm/s
Left ICA dist dias: -14 cm/s
Left ICA prox dias: -26 cm/s
Left ICA prox sys: -120 cm/s
RCCADSYS: -97 cm/s
RIGHT CCA MID DIAS: 15 cm/s
Right CCA prox dias: 11 cm/s
Right CCA prox sys: 103 cm/s

## 2015-11-29 NOTE — Progress Notes (Signed)
Chief Complaint: Follow up Extracranial Carotid Artery Stenosis   History of Present Illness  [name] is a 82 y.o. female patient of Dr. Scot Dock who is status post left CEA in June of 2010 as well as right CEA in 2005. She returns today for follow up. She met with Dr. Donnetta Hutching in October 2015, had a venous Duplex, discussed laser ablation of GSV and stab phlebectomy of varicosities; pt decided not to proceed. She has the thigh high 20-30 mm Hg compression stockings but cannot get them on, she has the donning device which does not help. She denies claudication symptoms with walking, denies non healing wounds.  Patient denies any history of TIA or stroke symptoms. Specifically th patient denies a history of amaurosis fugax or monocular blindness, unilateral facial drooping, hemiplegia, or receptive or expressive aphasia.   Pt denies New Medical or Surgical History.  Pt Diabetic: No Pt smoker: former smoker, quit in 1989  Pt meds include: Statin : Yes ASA: No Other anticoagulants/antiplatelets: coumadin for atrial fib   Past Medical History:  Diagnosis Date  . Atrial fibrillation ([place])   . Carotid artery disease ([place])    RIght CEA 05/2004  . Chronic anticoagulation   . Coronary atherosclerosis of native coronary artery    RCA stent in 2003; residual 60% mid Cx and OM2  . Essential hypertension, benign   . Hyperlipidemia   . Obesity   . Renal artery stenosis ([facility])    Bilateral renal artery stents 2004  . Tobacco abuse, in remission    40 pack year stopped in 1989  . Venous insufficiency (chronic) (peripheral)    Pedal edema    Social History Social History  Substance Use Topics  . Smoking status: Former Smoker    Types: Cigarettes    Quit date: 04/30/1987  . Smokeless tobacco: Never Used  . Alcohol use No    Family History Family History  Problem Relation Age of Onset  . Lung cancer Mother   . Cancer Mother   . Hypertension Daughter   .  Hyperlipidemia Daughter   . Hypertension Son   . Hypertension Daughter   . Heart disease Maternal Grandmother     Surgical History Past Surgical History:  Procedure Laterality Date  . APPENDECTOMY    . CAROTID ENDARTERECTOMY     Right in 2005, left in 2010  . COLONOSCOPY  Never   Declines  . COLONOSCOPY N/A 08/19/2015   Procedure: COLONOSCOPY;  Surgeon: Rogene Houston, MD;  [facility];  Service: Endoscopy;  Laterality: N/A;  7:30  . CORONARY ANGIOPLASTY    . ESOPHAGOGASTRODUODENOSCOPY N/A 08/19/2015   Procedure: ESOPHAGOGASTRODUODENOSCOPY (EGD);  Surgeon: Rogene Houston, MD;  [facility];  Service: Endoscopy;  Laterality: N/A;    Allergies  Allergen Reactions  . Diltiazem Other (See Comments)    Edema  . Verapamil Other (See Comments)    Diarrhea    Current Outpatient Prescriptions  Medication Sig Dispense Refill  . atorvastatin (LIPITOR) 80 MG tablet Take 1 tablet (80 mg total) by mouth at bedtime. 60 tablet 6  . bismuth-metronidazole-tetracycline (PYLERA) 140-125-125 MG capsule Take 3 capsules by mouth 4 (four) times daily -  before meals and at bedtime. 120 capsule 0  . bisoprolol (ZEBETA) 10 MG tablet take 1 tablet twice a day 60 tablet 6  . furosemide (LASIX) 40 MG tablet TAKE 60 MG (1 1/2 PILLS) IN THE AM,  TAKE 40 MG - 1 PILL IN PM  135 tablet 3  . losartan (COZAAR) 100 MG tablet take 1 tablet by mouth once daily 90 tablet 3  . pantoprazole (PROTONIX) 40 MG tablet Take 1 tablet (40 mg total) by mouth 2 (two) times daily before a meal. 60 tablet 1  . potassium chloride SA (K-DUR,KLOR-CON) 20 MEQ tablet take 1 tablet by mouth once daily 30 tablet 11  . terazosin (HYTRIN) 5 MG capsule take 1 capsule by mouth at bedtime 90 capsule 3  . warfarin (COUMADIN) 5 MG tablet take 1 tablet by mouth once daily 40 tablet 3   No current facility-administered medications for this visit.     Review of Systems : See HPI for pertinent positives and  negatives.  Physical Examination  Vitals:   11/29/15 1035 11/29/15 1040  BP: 135/67 130/68  Pulse: 79 79  Resp: 16   Temp: 97.1 F (36.2 C) 97.1 F (36.2 C)  SpO2: 96%   Weight: 172 lb (78 kg)   Height: 5' (1.524 m)    Body mass index is 33.59 kg/m.  General: WDWN obese female in NAD GAIT: normal Eyes: PERRLA Pulmonary: Respirations are non-labored, CTAB, good air movement in all fields.   Cardiac: Irregular rhythm  VASCULAR EXAM Carotid Bruits Right Left   Negative Negative   Aorta is not palpable. Radial pulses are 2+ palpable and equal.     Gastrointestinal: soft, nontender, BS WNL, no r/g, no palpable masses.  Musculoskeletal: No muscle atrophy/wasting. M/S 5/5 throughout, Extremities without ischemic changes. Multiple varicosities in lower legs. 1-2+ pitting and non pitting edema in ankles and feet.  Neurologic: A&O X 3; Appropriate Affect,  Speech is normal CN 2-12 intact except is slightly hard of hearing, Pain and light touch intact in extremities, Motor exam as listed above.   Assessment: [name] is a 82 y.o. female who is status post left CEA in June of 2010 as well as right CEA in 2005. She has no history of stroke or TIA. Today's carotid Duplex suggests patent bilateral carotid endarterectomy sites with <40% ICA stenoses.  No significant change noted when compared to the previous exam on 11/11/13 and 11/17/14.   Plan: Follow-up in 1 year with Carotid Duplex scan.   I discussed in depth with the patient the nature of atherosclerosis, and emphasized the importance of maximal medical management including strict control of blood pressure, blood glucose, and lipid levels, obtaining regular exercise, and continued cessation of smoking.  The patient is aware that without maximal medical management the underlying  atherosclerotic disease process will progress, limiting the benefit of any interventions. The patient was given information about stroke prevention and what symptoms should prompt the patient to seek immediate medical care. Thank you for allowing [place] to participate in this patient's care.  Clemon Chambers, RN, MSN, FNP-C [facility] Office: [phone]  Clinic Physician: Early  11/29/15 10:44 AM

## 2015-11-29 NOTE — Patient Instructions (Signed)
Stroke Prevention Some medical conditions and behaviors are associated with an increased chance of having a stroke. You may prevent a stroke by making healthy choices and managing medical conditions. HOW CAN I REDUCE MY RISK OF HAVING A STROKE?   Stay physically active. Get at least 30 minutes of activity on most or all days.  Do not smoke. It may also be helpful to avoid exposure to secondhand smoke.  Limit alcohol use. Moderate alcohol use is considered to be:  No more than 2 drinks per day for men.  No more than 1 drink per day for nonpregnant women.  Eat healthy foods. This involves:  Eating 5 or more servings of fruits and vegetables a day.  Making dietary changes that address high blood pressure (hypertension), high cholesterol, diabetes, or obesity.  Manage your cholesterol levels.  Making food choices that are high in fiber and low in saturated fat, trans fat, and cholesterol may control cholesterol levels.  Take any prescribed medicines to control cholesterol as directed by your health care provider.  Manage your diabetes.  Controlling your carbohydrate and sugar intake is recommended to manage diabetes.  Take any prescribed medicines to control diabetes as directed by your health care provider.  Control your hypertension.  Making food choices that are low in salt (sodium), saturated fat, trans fat, and cholesterol is recommended to manage hypertension.  Ask your health care provider if you need treatment to lower your blood pressure. Take any prescribed medicines to control hypertension as directed by your health care provider.  If you are 18-39 years of age, have your blood pressure checked every 3-5 years. If you are 40 years of age or older, have your blood pressure checked every year.  Maintain a healthy weight.  Reducing calorie intake and making food choices that are low in sodium, saturated fat, trans fat, and cholesterol are recommended to manage  weight.  Stop drug abuse.  Avoid taking birth control pills.  Talk to your health care provider about the risks of taking birth control pills if you are over 35 years old, smoke, get migraines, or have ever had a blood clot.  Get evaluated for sleep disorders (sleep apnea).  Talk to your health care provider about getting a sleep evaluation if you snore a lot or have excessive sleepiness.  Take medicines only as directed by your health care provider.  For some people, aspirin or blood thinners (anticoagulants) are helpful in reducing the risk of forming abnormal blood clots that can lead to stroke. If you have the irregular heart rhythm of atrial fibrillation, you should be on a blood thinner unless there is a good reason you cannot take them.  Understand all your medicine instructions.  Make sure that other conditions (such as anemia or atherosclerosis) are addressed. SEEK IMMEDIATE MEDICAL CARE IF:   You have sudden weakness or numbness of the face, arm, or leg, especially on one side of the body.  Your face or eyelid droops to one side.  You have sudden confusion.  You have trouble speaking (aphasia) or understanding.  You have sudden trouble seeing in one or both eyes.  You have sudden trouble walking.  You have dizziness.  You have a loss of balance or coordination.  You have a sudden, severe headache with no known cause.  You have new chest pain or an irregular heartbeat. Any of these symptoms may represent a serious problem that is an emergency. Do not wait to see if the symptoms will   go away. Get medical help at once. Call your local emergency services (911 in [place] Do not drive yourself to the hospital.   This information is not intended to replace advice given to you by your health care provider. Make sure you discuss any questions you have with your health care provider.   Document Released: 05/10/2004 Document Revised: 04/23/2014 Document Reviewed:  10/03/2012 Elsevier Interactive Patient Education 2016 Elsevier Inc.  

## 2015-11-30 ENCOUNTER — Ambulatory Visit (INDEPENDENT_AMBULATORY_CARE_PROVIDER_SITE_OTHER): Payer: Medicare Other | Admitting: *Deleted

## 2015-11-30 DIAGNOSIS — I4891 Unspecified atrial fibrillation: Secondary | ICD-10-CM

## 2015-11-30 DIAGNOSIS — Z5181 Encounter for therapeutic drug level monitoring: Secondary | ICD-10-CM

## 2015-11-30 DIAGNOSIS — I482 Chronic atrial fibrillation, unspecified: Secondary | ICD-10-CM

## 2015-11-30 LAB — POCT INR: INR: 1.7

## 2015-12-13 ENCOUNTER — Other Ambulatory Visit: Payer: Self-pay | Admitting: *Deleted

## 2015-12-13 DIAGNOSIS — I6523 Occlusion and stenosis of bilateral carotid arteries: Secondary | ICD-10-CM

## 2015-12-14 ENCOUNTER — Ambulatory Visit (INDEPENDENT_AMBULATORY_CARE_PROVIDER_SITE_OTHER): Payer: Medicare Other | Admitting: *Deleted

## 2015-12-14 DIAGNOSIS — I482 Chronic atrial fibrillation, unspecified: Secondary | ICD-10-CM

## 2015-12-14 DIAGNOSIS — I4891 Unspecified atrial fibrillation: Secondary | ICD-10-CM | POA: Diagnosis not present

## 2015-12-14 DIAGNOSIS — Z5181 Encounter for therapeutic drug level monitoring: Secondary | ICD-10-CM | POA: Diagnosis not present

## 2015-12-14 LAB — POCT INR: INR: 3.8

## 2016-01-04 ENCOUNTER — Ambulatory Visit (INDEPENDENT_AMBULATORY_CARE_PROVIDER_SITE_OTHER): Payer: Medicare Other | Admitting: *Deleted

## 2016-01-04 DIAGNOSIS — I482 Chronic atrial fibrillation, unspecified: Secondary | ICD-10-CM

## 2016-01-04 DIAGNOSIS — I4891 Unspecified atrial fibrillation: Secondary | ICD-10-CM | POA: Diagnosis not present

## 2016-01-04 DIAGNOSIS — Z5181 Encounter for therapeutic drug level monitoring: Secondary | ICD-10-CM

## 2016-01-04 LAB — POCT INR: INR: 3

## 2016-01-16 DIAGNOSIS — I739 Peripheral vascular disease, unspecified: Secondary | ICD-10-CM | POA: Diagnosis not present

## 2016-01-16 DIAGNOSIS — R600 Localized edema: Secondary | ICD-10-CM | POA: Diagnosis not present

## 2016-01-16 DIAGNOSIS — R7309 Other abnormal glucose: Secondary | ICD-10-CM | POA: Diagnosis not present

## 2016-01-16 DIAGNOSIS — Z23 Encounter for immunization: Secondary | ICD-10-CM | POA: Diagnosis not present

## 2016-01-16 DIAGNOSIS — Z1389 Encounter for screening for other disorder: Secondary | ICD-10-CM | POA: Diagnosis not present

## 2016-01-16 DIAGNOSIS — I4891 Unspecified atrial fibrillation: Secondary | ICD-10-CM | POA: Diagnosis not present

## 2016-01-16 DIAGNOSIS — Z6833 Body mass index (BMI) 33.0-33.9, adult: Secondary | ICD-10-CM | POA: Diagnosis not present

## 2016-01-16 DIAGNOSIS — I2510 Atherosclerotic heart disease of native coronary artery without angina pectoris: Secondary | ICD-10-CM | POA: Diagnosis not present

## 2016-01-27 ENCOUNTER — Other Ambulatory Visit: Payer: Self-pay | Admitting: Cardiology

## 2016-01-30 NOTE — Progress Notes (Signed)
Cardiology Office Note  Date: 01/31/2016   ID: TYKERRIA [name], DOB 17-Jan-1933, MRN [id number]  PCP: Peggy Kilts, MD  Primary Cardiologist: Peggy Lesches, MD   Chief Complaint  Patient presents with  . Coronary Artery Disease  . Atrial Fibrillation    History of Present Illness: [name] is an 82 y.o. female last seen in July. She is here today with her daughter for a follow-up visit. She has had no chest pain or palpitations, but in the interim did have trouble with progressive leg edema resulting in a temporary doubling of her Lasix dose by Dr. Hilma Favors. She had improvement in symptoms, but her legs are starting to swell again. We had already increased her baseline Lasix dose last visit.  She continues on Coumadin, followed in the anticoagulation clinic. Follow-up lab work is outlined below. No bleeding problems reported.  Past Medical History:  Diagnosis Date  . Atrial fibrillation ([place])   . Carotid artery disease ([place])    RIght CEA 05/2004  . Chronic anticoagulation   . Coronary atherosclerosis of native coronary artery    RCA stent in 2003; residual 60% mid Cx and OM2  . Essential hypertension, benign   . Hyperlipidemia   . Obesity   . Renal artery stenosis ([facility])    Bilateral renal artery stents 2004  . Tobacco abuse, in remission    40 pack year stopped in 1989  . Venous insufficiency (chronic) (peripheral)    Pedal edema    Current Outpatient Prescriptions  Medication Sig Dispense Refill  . atorvastatin (LIPITOR) 80 MG tablet Take 1 tablet (80 mg total) by mouth at bedtime. 60 tablet 6  . bisoprolol (ZEBETA) 10 MG tablet take 1 tablet twice a day 60 tablet 6  . losartan (COZAAR) 100 MG tablet take 1 tablet by mouth once daily 90 tablet 3  . potassium chloride SA (K-DUR,KLOR-CON) 20 MEQ tablet take 1 tablet by mouth once daily 30 tablet 11  . terazosin (HYTRIN) 5 MG capsule take 1 capsule by mouth at bedtime 90 capsule 3  . warfarin  (COUMADIN) 5 MG tablet Take as directed by Coumadin Clinic 40 tablet 3   No current facility-administered medications for this visit.    Allergies:  Diltiazem and Verapamil   Social History: The patient  reports that she quit smoking about 28 years ago. Her smoking use included Cigarettes. She has never used smokeless tobacco. She reports that she does not drink alcohol or use drugs.   ROS:  Please see the history of present illness. Otherwise, complete review of systems is positive for some trouble with memory.  All other systems are reviewed and negative.   Physical Exam: VS:  BP (!) 118/58   Pulse 85   Ht 5\' 1"  (1.549 m)   Wt 174 lb (78.9 kg)   SpO2 97%   BMI 32.88 kg/m , BMI Body mass index is 32.88 kg/m.  Wt Readings from Last 3 Encounters:  01/31/16 174 lb (78.9 kg)  11/29/15 172 lb (78 kg)  11/22/15 176 lb 1.6 oz (79.9 kg)    Pleasant elderly woman, no distress.  HEENT: Conjunctiva and lids normal, oropharynx clear.  Neck: Supple, no elevated JVP or carotid bruits, no thyromegaly.  Lungs: Clear to auscultation, nonlabored breathing at rest.  Cardiac: Irregularly irregular, no S3 or significant systolic murmur, no pericardial rub.  Abdomen: Soft, nontender,bowel sounds present.  Extremities: 2+ bilateral leg swelling and venous stasis, distal pulses 2+.  Skin: Warm  and dry.  Musculoskeletal: No kyphosis.  Neuropsychiatric: Alert and oriented x3, affect grossly appropriate.  ECG: I personally reviewed the tracing from 07/11/2015 which showed atrial fibrillation with nonspecific ST-T changes.  Recent Labwork: 07/11/2015: ALT 13; AST 21 07/13/2015: Platelets 182 11/16/2015: BUN 20; Creat 0.99; Potassium 4.8; Sodium 140 11/23/2015: Hemoglobin 9.1     Component Value Date/Time   CHOL 91 (L) 12/22/2014 1035   TRIG 79 12/22/2014 1035   HDL 34 (L) 12/22/2014 1035   CHOLHDL 2.7 12/22/2014 1035   VLDL 16 12/22/2014 1035   LDLCALC 41 12/22/2014 1035    Other Studies  Reviewed Today:  Echocardiogram 07/07/2015: Study Conclusions  - Left ventricle: The cavity size was normal. Wall thickness was  increased in a pattern of mild LVH. Systolic function was normal.  The estimated ejection fraction was in the range of 60% to 65%. - Mitral valve: There was mild regurgitation. - Left atrium: The atrium was mildly dilated. - Right ventricle: The cavity size was moderately dilated. - Right atrium: The atrium was moderately dilated. - Atrial septum: No defect or patent foramen ovale was identified. - Tricuspid valve: There was moderate regurgitation. - Pulmonary arteries: PA peak pressure: 42 mm Hg (S).  Assessment and Plan:  1. Chronic diastolic heart failure, intermittent worsening of leg edema noted. We will increase baseline Lasix dose to 80 mg in the morning and 40 mg in the afternoon. Continue potassium supplements. Follow-up BMET for her next visit.  2. Chronic atrial fibrillation, continue Coumadin for stroke prophylaxis in bisoprolol for heart rate control.  3. Symptomatically stable CAD without active angina symptoms.  Current medicines were reviewed with the patient today.   Disposition: Follow-up with me in 3 months.  Signed, Satira Sark, MD, [facility] 01/31/2016 10:06 AM    [place] [facility] [place]. [address], [place], [place] [address] Phone: [phone]; Fax: [phone]

## 2016-01-31 ENCOUNTER — Encounter: Payer: Self-pay | Admitting: Cardiology

## 2016-01-31 ENCOUNTER — Ambulatory Visit (INDEPENDENT_AMBULATORY_CARE_PROVIDER_SITE_OTHER): Payer: Medicare Other | Admitting: Cardiology

## 2016-01-31 VITALS — BP 118/58 | HR 85 | Ht 61.0 in | Wt 174.0 lb

## 2016-01-31 DIAGNOSIS — I482 Chronic atrial fibrillation, unspecified: Secondary | ICD-10-CM

## 2016-01-31 DIAGNOSIS — Z79899 Other long term (current) drug therapy: Secondary | ICD-10-CM | POA: Diagnosis not present

## 2016-01-31 DIAGNOSIS — I6523 Occlusion and stenosis of bilateral carotid arteries: Secondary | ICD-10-CM | POA: Diagnosis not present

## 2016-01-31 DIAGNOSIS — R600 Localized edema: Secondary | ICD-10-CM

## 2016-01-31 DIAGNOSIS — I2510 Atherosclerotic heart disease of native coronary artery without angina pectoris: Secondary | ICD-10-CM | POA: Diagnosis not present

## 2016-01-31 MED ORDER — FUROSEMIDE 40 MG PO TABS: ORAL_TABLET | ORAL | 6 refills | Status: DC

## 2016-01-31 NOTE — Patient Instructions (Signed)
Your physician recommends that you schedule a follow-up appointment in: 3 months [name]    Take lasix 80 mg ( 2 pills) in the am and 40 mg (1 pill)  in the afternoon    Get lab work : BMET JUST BEFORE NEXT VISIT      Thank you for choosing [facility] !

## 2016-02-01 ENCOUNTER — Ambulatory Visit (INDEPENDENT_AMBULATORY_CARE_PROVIDER_SITE_OTHER): Payer: Medicare Other | Admitting: *Deleted

## 2016-02-01 DIAGNOSIS — I482 Chronic atrial fibrillation, unspecified: Secondary | ICD-10-CM

## 2016-02-01 DIAGNOSIS — Z5181 Encounter for therapeutic drug level monitoring: Secondary | ICD-10-CM

## 2016-02-01 DIAGNOSIS — I4891 Unspecified atrial fibrillation: Secondary | ICD-10-CM | POA: Diagnosis not present

## 2016-02-01 LAB — POCT INR: INR: 3

## 2016-02-09 ENCOUNTER — Other Ambulatory Visit: Payer: Self-pay | Admitting: Cardiology

## 2016-02-21 ENCOUNTER — Ambulatory Visit (INDEPENDENT_AMBULATORY_CARE_PROVIDER_SITE_OTHER): Payer: Medicare Other | Admitting: Internal Medicine

## 2016-02-21 ENCOUNTER — Encounter (INDEPENDENT_AMBULATORY_CARE_PROVIDER_SITE_OTHER): Payer: Self-pay | Admitting: Internal Medicine

## 2016-02-21 VITALS — BP 128/76 | HR 82 | Temp 98.1°F | Resp 18 | Ht 62.0 in | Wt 175.0 lb

## 2016-02-21 DIAGNOSIS — D500 Iron deficiency anemia secondary to blood loss (chronic): Secondary | ICD-10-CM | POA: Diagnosis not present

## 2016-02-21 DIAGNOSIS — I6523 Occlusion and stenosis of bilateral carotid arteries: Secondary | ICD-10-CM | POA: Diagnosis not present

## 2016-02-21 DIAGNOSIS — Z8711 Personal history of peptic ulcer disease: Secondary | ICD-10-CM | POA: Diagnosis not present

## 2016-02-21 NOTE — Progress Notes (Signed)
Presenting complaint;  Follow-up of peptic ulcer disease and iron deficiency anemia.  Database and Subjective:  Patient is 82 year old Caucasian female moderately anticoagulated because of history of atrial fibrillation. She was evaluated in May 2017 for iron deficiency anemia. EGD revealed small antral/gastric ulcer. H. pylori is positive. 2 small polyps removed at colonoscopy in one was tubular adenoma. She was last seen on 11/22/2015 and she was treated with Pylera for 10 days. She states she was able to finish that medication. She denies melena or rectal bleeding. She states stools are Dr. and she is on iron. She has good appetite. She denies abdominal pain nausea vomiting or heartburn. She denies feeling fatigue or short of breath when she does housework.   Current Medications: Outpatient Encounter Prescriptions as of 02/21/2016  Medication Sig  . atorvastatin (LIPITOR) 80 MG tablet Take 1 tablet (80 mg total) by mouth at bedtime.  . bisoprolol (ZEBETA) 10 MG tablet take 1 tablet by mouth twice a day  . furosemide (LASIX) 40 MG tablet Take 80 mg ( 2 tablets) in the am, and take 40 mg ( 1 tablet) in the afternoon  . losartan (COZAAR) 100 MG tablet take 1 tablet by mouth once daily  . Pediatric Multivitamins-Iron (FLINTSTONES PLUS IRON PO) Take by mouth 2 (two) times daily.  . potassium chloride SA (K-DUR,KLOR-CON) 20 MEQ tablet take 1 tablet by mouth once daily  . terazosin (HYTRIN) 5 MG capsule take 1 capsule by mouth at bedtime  . warfarin (COUMADIN) 5 MG tablet Take as directed by Coumadin Clinic   No facility-administered encounter medications on file as of 02/21/2016.      Objective: Blood pressure 128/76, pulse 82, temperature 98.1 F (36.7 C), temperature source Oral, resp. rate 18, height 5\' 2"  (1.575 m), weight 175 lb (79.4 kg). Patient is alert and in no acute distress. Conjunctiva is pink. Sclera is nonicteric Oropharyngeal mucosa is normal. No neck masses or thyromegaly  noted. Cardiac exam with irregular rhythm normal S1 and S2. No murmur or gallop noted. Lungs are clear to auscultation. Abdomen is soft and nontender without organomegaly or masses.  She has 1-2+ pitting edema around both ankles and distal legs.  Labs/studies Results: H&H was 9.1 and 30.3 on 11/23/2015.    Assessment:  #1. History of peptic ulcer disease felt to be source of patient's GI blood loss and iron deficiency anemia. She has been treated for H. pylori gastritis. She is not having melena anymore. If she remains with anemia and heme positive stool she may need follow-up EGD and further testing. She is presently not taking PPI. #2. History of iron deficiency anemia secondary to chronic GI blood loss. She is on Coumadin and has not had overt bleeding.   Plan:  Hemoccult 1. Patient will have H&H with her next blood draw. Office visit in 6 months or earlier if she has melena or frank rectal bleeding.

## 2016-02-21 NOTE — Patient Instructions (Signed)
Physician will call with results of H&H when completed. Hemoccult 1

## 2016-02-24 ENCOUNTER — Encounter (INDEPENDENT_AMBULATORY_CARE_PROVIDER_SITE_OTHER): Payer: Self-pay

## 2016-02-29 ENCOUNTER — Other Ambulatory Visit (INDEPENDENT_AMBULATORY_CARE_PROVIDER_SITE_OTHER): Payer: Self-pay | Admitting: *Deleted

## 2016-02-29 ENCOUNTER — Telehealth (INDEPENDENT_AMBULATORY_CARE_PROVIDER_SITE_OTHER): Payer: Self-pay | Admitting: *Deleted

## 2016-02-29 ENCOUNTER — Ambulatory Visit (INDEPENDENT_AMBULATORY_CARE_PROVIDER_SITE_OTHER): Payer: Medicare Other | Admitting: *Deleted

## 2016-02-29 DIAGNOSIS — D509 Iron deficiency anemia, unspecified: Secondary | ICD-10-CM

## 2016-02-29 DIAGNOSIS — I6523 Occlusion and stenosis of bilateral carotid arteries: Secondary | ICD-10-CM

## 2016-02-29 DIAGNOSIS — D649 Anemia, unspecified: Secondary | ICD-10-CM

## 2016-02-29 DIAGNOSIS — K921 Melena: Secondary | ICD-10-CM

## 2016-02-29 DIAGNOSIS — Z5181 Encounter for therapeutic drug level monitoring: Secondary | ICD-10-CM | POA: Diagnosis not present

## 2016-02-29 DIAGNOSIS — I4891 Unspecified atrial fibrillation: Secondary | ICD-10-CM

## 2016-02-29 DIAGNOSIS — Z8711 Personal history of peptic ulcer disease: Secondary | ICD-10-CM | POA: Diagnosis not present

## 2016-02-29 DIAGNOSIS — I482 Chronic atrial fibrillation, unspecified: Secondary | ICD-10-CM

## 2016-02-29 LAB — POCT INR: INR: 2.6

## 2016-02-29 LAB — HEMOGLOBIN AND HEMATOCRIT, BLOOD
HEMATOCRIT: 28 % — AB (ref 35.0–45.0)
HEMOGLOBIN: 8.5 g/dL — AB (ref 11.7–15.5)

## 2016-02-29 NOTE — Telephone Encounter (Signed)
Patient was called and made aware of her hemoccult results and H&H.  Per Dr.Rehman - Make sure that the patient is taking her Iron as prescribed and the Pantoprazole 40 mg. Patient states that she is taking her Iron twice daily. She is not taking Pantoprazole. A Rx was called to [facility] for : Pantoprazole 40 mg - take 1 by mouth daily #30 5 refills. Patient will pick up 03/01/2016.  We will do CBC on 03/06/2016 , we will request that they draw an extra tube in case patient needs to be transfused.

## 2016-02-29 NOTE — Telephone Encounter (Signed)
   Diagnosis:    Result(s)   Card 1: Positive       Completed by: Thomas Hoff , LPN   HEMOCCULT SENSA DEVELOPER: [id number] Marguarite Arbour DATE: 2020-05   HEMOCCULT SENSA CARD:  [id number] R   EXPIRATION DATE: 03/20   CARD CONTROL RESULTS:  POSITIVE: Positive  NEGATIVE: Negative    ADDITIONAL COMMENTS: Patient was called and given her hemoccult results, she also had H&H this morning. Result pending.

## 2016-03-01 ENCOUNTER — Other Ambulatory Visit (INDEPENDENT_AMBULATORY_CARE_PROVIDER_SITE_OTHER): Payer: Self-pay | Admitting: *Deleted

## 2016-03-01 DIAGNOSIS — K921 Melena: Secondary | ICD-10-CM

## 2016-03-01 DIAGNOSIS — D649 Anemia, unspecified: Secondary | ICD-10-CM

## 2016-03-02 NOTE — Telephone Encounter (Signed)
Stool heme positive. Patient advised to go back on PPI.

## 2016-03-06 ENCOUNTER — Other Ambulatory Visit (HOSPITAL_COMMUNITY)
Admission: RE | Admit: 2016-03-06 | Discharge: 2016-03-06 | Disposition: A | Discharge status: Home / Self Care | Payer: Medicare Other | Source: Ambulatory Visit | Attending: Internal Medicine | Admitting: Internal Medicine

## 2016-03-06 DIAGNOSIS — D649 Anemia, unspecified: Secondary | ICD-10-CM | POA: Insufficient documentation

## 2016-03-06 DIAGNOSIS — K921 Melena: Secondary | ICD-10-CM | POA: Diagnosis not present

## 2016-03-06 LAB — CBC
HCT: 29.4 % — ABNORMAL LOW (ref 36.0–46.0)
HEMOGLOBIN: 8.7 g/dL — AB (ref 12.0–15.0)
MCH: 27 pg (ref 26.0–34.0)
MCHC: 29.6 g/dL — AB (ref 30.0–36.0)
MCV: 91.3 fL (ref 78.0–100.0)
Platelets: 160 10*3/uL (ref 150–400)
RBC: 3.22 MIL/uL — ABNORMAL LOW (ref 3.87–5.11)
RDW: 17.4 % — ABNORMAL HIGH (ref 11.5–15.5)
WBC: 6.2 10*3/uL (ref 4.0–10.5)

## 2016-03-07 ENCOUNTER — Other Ambulatory Visit (INDEPENDENT_AMBULATORY_CARE_PROVIDER_SITE_OTHER): Payer: Self-pay | Admitting: *Deleted

## 2016-03-07 ENCOUNTER — Encounter (INDEPENDENT_AMBULATORY_CARE_PROVIDER_SITE_OTHER): Payer: Self-pay | Admitting: *Deleted

## 2016-03-07 DIAGNOSIS — D649 Anemia, unspecified: Secondary | ICD-10-CM

## 2016-03-13 DIAGNOSIS — D649 Anemia, unspecified: Secondary | ICD-10-CM | POA: Diagnosis not present

## 2016-03-13 LAB — HEMOGLOBIN AND HEMATOCRIT, BLOOD
HEMATOCRIT: 30 % — AB (ref 35.0–45.0)
HEMOGLOBIN: 8.8 g/dL — AB (ref 11.7–15.5)

## 2016-03-14 ENCOUNTER — Encounter (INDEPENDENT_AMBULATORY_CARE_PROVIDER_SITE_OTHER): Payer: Self-pay | Admitting: *Deleted

## 2016-03-14 ENCOUNTER — Other Ambulatory Visit (INDEPENDENT_AMBULATORY_CARE_PROVIDER_SITE_OTHER): Payer: Self-pay | Admitting: *Deleted

## 2016-03-14 DIAGNOSIS — D649 Anemia, unspecified: Secondary | ICD-10-CM

## 2016-03-28 DIAGNOSIS — D649 Anemia, unspecified: Secondary | ICD-10-CM | POA: Diagnosis not present

## 2016-03-28 LAB — HEMOGLOBIN AND HEMATOCRIT, BLOOD
HEMATOCRIT: 32.4 % — AB (ref 35.0–45.0)
Hemoglobin: 9.7 g/dL — ABNORMAL LOW (ref 11.7–15.5)

## 2016-03-30 ENCOUNTER — Encounter (INDEPENDENT_AMBULATORY_CARE_PROVIDER_SITE_OTHER): Payer: Self-pay | Admitting: *Deleted

## 2016-03-30 ENCOUNTER — Other Ambulatory Visit (INDEPENDENT_AMBULATORY_CARE_PROVIDER_SITE_OTHER): Payer: Self-pay | Admitting: *Deleted

## 2016-03-30 DIAGNOSIS — D508 Other iron deficiency anemias: Secondary | ICD-10-CM

## 2016-04-05 ENCOUNTER — Other Ambulatory Visit: Payer: Self-pay | Admitting: Cardiology

## 2016-04-11 ENCOUNTER — Ambulatory Visit (INDEPENDENT_AMBULATORY_CARE_PROVIDER_SITE_OTHER): Payer: Medicare Other | Admitting: *Deleted

## 2016-04-11 ENCOUNTER — Encounter: Payer: Self-pay | Admitting: *Deleted

## 2016-04-11 ENCOUNTER — Telehealth: Payer: Self-pay | Admitting: *Deleted

## 2016-04-11 DIAGNOSIS — I482 Chronic atrial fibrillation, unspecified: Secondary | ICD-10-CM

## 2016-04-11 DIAGNOSIS — I4891 Unspecified atrial fibrillation: Secondary | ICD-10-CM | POA: Diagnosis not present

## 2016-04-11 DIAGNOSIS — Z5181 Encounter for therapeutic drug level monitoring: Secondary | ICD-10-CM | POA: Diagnosis not present

## 2016-04-11 DIAGNOSIS — D508 Other iron deficiency anemias: Secondary | ICD-10-CM | POA: Diagnosis not present

## 2016-04-11 DIAGNOSIS — I6523 Occlusion and stenosis of bilateral carotid arteries: Secondary | ICD-10-CM

## 2016-04-11 LAB — HEMOGLOBIN AND HEMATOCRIT, BLOOD
HCT: 32.9 % — ABNORMAL LOW (ref 35.0–45.0)
Hemoglobin: 9.8 g/dL — ABNORMAL LOW (ref 11.7–15.5)

## 2016-04-11 LAB — POCT INR: INR: 4.7

## 2016-04-11 MED ORDER — METOLAZONE 5 MG PO TABS: ORAL_TABLET | ORAL | 0 refills | Status: DC

## 2016-04-11 NOTE — Telephone Encounter (Signed)
-----   Message from Satira Sark, MD sent at 04/11/2016  1:55 PM EST ----- Why don't we add Zaroxolyn 5 mg daily for a few days to see if this might help get the edema under better control.  ----- Message ----- From: Malen Gauze, RN Sent: 04/11/2016   1:32 PM To: Satira Sark, MD  Pt came in for INR check today.  Continues to complain of bilateral leg edema.  Pt has 2+ pitting edema 1/2 way up to knees.  No redness, blisters or drainage.  You increased lasix LOV to 80mg  in am and 40mg  in pm.  Pt states it has not helped.  Is keeping legs elevated and watching sodium intake. Has f/u with you 1/22 but I made pt earlier appt with KL on 1/9.  Told pt I would let you know incase you wanted to make any meds changes before then. Thanks, Lattie Haw

## 2016-04-11 NOTE — Telephone Encounter (Signed)
LMOM at home.  Called in Rx for Zaroxolyn 5mg  1 tablet daily x 3 days # 10 tabs to [facility].  Pt to call on Tuesday with update on leg edema.

## 2016-04-12 ENCOUNTER — Encounter (INDEPENDENT_AMBULATORY_CARE_PROVIDER_SITE_OTHER): Payer: Self-pay | Admitting: *Deleted

## 2016-04-12 ENCOUNTER — Other Ambulatory Visit (INDEPENDENT_AMBULATORY_CARE_PROVIDER_SITE_OTHER): Payer: Self-pay | Admitting: *Deleted

## 2016-04-12 DIAGNOSIS — D508 Other iron deficiency anemias: Secondary | ICD-10-CM

## 2016-04-17 ENCOUNTER — Telehealth: Payer: Self-pay | Admitting: *Deleted

## 2016-04-17 NOTE — Telephone Encounter (Signed)
Patient would like return phone call / tg  °

## 2016-04-17 NOTE — Telephone Encounter (Signed)
Spoke with pt.  States swelling has gone down a little in legs but not a lot.  Told pt to take Zaroxolyn 5mg  today and tomorrow along with regular meds.  She has F/U appt with Curt Bears next [facility] 1/9.

## 2016-04-24 ENCOUNTER — Encounter: Payer: Self-pay | Admitting: Adult Health

## 2016-04-24 ENCOUNTER — Ambulatory Visit (INDEPENDENT_AMBULATORY_CARE_PROVIDER_SITE_OTHER): Payer: Medicare Other | Admitting: Adult Health

## 2016-04-24 ENCOUNTER — Other Ambulatory Visit (HOSPITAL_COMMUNITY)
Admission: RE | Admit: 2016-04-24 | Discharge: 2016-04-24 | Disposition: A | Discharge status: Home / Self Care | Payer: Medicare Other | Source: Ambulatory Visit | Attending: Adult Health | Admitting: Adult Health

## 2016-04-24 ENCOUNTER — Ambulatory Visit (INDEPENDENT_AMBULATORY_CARE_PROVIDER_SITE_OTHER): Payer: Medicare Other | Admitting: *Deleted

## 2016-04-24 VITALS — BP 90/50 | HR 78 | Ht 62.0 in | Wt 175.0 lb

## 2016-04-24 DIAGNOSIS — I4891 Unspecified atrial fibrillation: Secondary | ICD-10-CM | POA: Diagnosis not present

## 2016-04-24 DIAGNOSIS — I5032 Chronic diastolic (congestive) heart failure: Secondary | ICD-10-CM | POA: Diagnosis not present

## 2016-04-24 DIAGNOSIS — Z5181 Encounter for therapeutic drug level monitoring: Secondary | ICD-10-CM | POA: Diagnosis not present

## 2016-04-24 DIAGNOSIS — I872 Venous insufficiency (chronic) (peripheral): Secondary | ICD-10-CM | POA: Diagnosis not present

## 2016-04-24 DIAGNOSIS — I482 Chronic atrial fibrillation, unspecified: Secondary | ICD-10-CM

## 2016-04-24 DIAGNOSIS — Z79899 Other long term (current) drug therapy: Secondary | ICD-10-CM | POA: Diagnosis not present

## 2016-04-24 DIAGNOSIS — D508 Other iron deficiency anemias: Secondary | ICD-10-CM | POA: Insufficient documentation

## 2016-04-24 DIAGNOSIS — I4821 Permanent atrial fibrillation: Secondary | ICD-10-CM

## 2016-04-24 LAB — COMPREHENSIVE METABOLIC PANEL
ALBUMIN: 3.5 g/dL (ref 3.5–5.0)
ALK PHOS: 91 U/L (ref 38–126)
ALT: 17 U/L (ref 14–54)
AST: 27 U/L (ref 15–41)
Anion gap: 7 (ref 5–15)
BUN: 52 mg/dL — ABNORMAL HIGH (ref 6–20)
CALCIUM: 9.5 mg/dL (ref 8.9–10.3)
CO2: 30 mmol/L (ref 22–32)
CREATININE: 1.39 mg/dL — AB (ref 0.44–1.00)
Chloride: 102 mmol/L (ref 101–111)
GFR calc non Af Amer: 35 mL/min — ABNORMAL LOW (ref >60)
GFR, EST AFRICAN AMERICAN: 40 mL/min — AB (ref >60)
GLUCOSE: 104 mg/dL — AB (ref 65–99)
Potassium: 4.1 mmol/L (ref 3.5–5.1)
SODIUM: 139 mmol/L (ref 135–145)
Total Bilirubin: 0.7 mg/dL (ref 0.3–1.2)
Total Protein: 7.3 g/dL (ref 6.5–8.1)

## 2016-04-24 LAB — CBC WITH DIFFERENTIAL/PLATELET
Basophils Absolute: 0 10*3/uL (ref 0.0–0.1)
Basophils Relative: 1 %
EOS ABS: 0.1 10*3/uL (ref 0.0–0.7)
Eosinophils Relative: 1 %
HEMATOCRIT: 27.3 % — AB (ref 36.0–46.0)
HEMOGLOBIN: 8.3 g/dL — AB (ref 12.0–15.0)
LYMPHS ABS: 1.2 10*3/uL (ref 0.7–4.0)
Lymphocytes Relative: 18 %
MCH: 26.4 pg (ref 26.0–34.0)
MCHC: 30.4 g/dL (ref 30.0–36.0)
MCV: 86.9 fL (ref 78.0–100.0)
Monocytes Absolute: 0.9 10*3/uL (ref 0.1–1.0)
Monocytes Relative: 13 %
NEUTROS ABS: 4.4 10*3/uL (ref 1.7–7.7)
Neutrophils Relative %: 67 %
Platelets: 166 10*3/uL (ref 150–400)
RBC: 3.14 MIL/uL — ABNORMAL LOW (ref 3.87–5.11)
RDW: 14.7 % (ref 11.5–15.5)
WBC: 6.5 10*3/uL (ref 4.0–10.5)

## 2016-04-24 LAB — PROTIME-INR
INR: 3.07
Prothrombin Time: 32.4 seconds — ABNORMAL HIGH (ref 11.4–15.2)

## 2016-04-24 MED ORDER — FERROUS SULFATE 325 (65 FE) MG PO TABS: 325.0000 mg | ORAL_TABLET | Freq: Every day | ORAL | 11 refills | Status: DC

## 2016-04-24 NOTE — Progress Notes (Signed)
Cardiology Office Note   Date:  04/24/2016   ID:  [name], DOB 02/23/1933, MRN [id number]  PCP:  Purvis Kilts, MD  Cardiologist: [place]/  Jory Sims, NP   Chief Complaint  Patient presents with  . Congestive Heart Failure  . Atrial Fibrillation      History of Present Illness: [name] is a 83 y.o. female who presents for ongoing assessment and management of coronary artery disease, RCA stent in 2003 with residual 60% to the mid circumflex and OM 2, hypertension, atrial fibrillation, history of peripheral arterial disease with renal artery stenosis, and chronic diastolic heart failure. On last office visit in October 2017 the patient's Lasix was increased to 80 mg in morning and 40 mg in the afternoon. She was continued on potassium supplements with follow-up labs. She was continued on Coumadin therapy for stroke prophylaxis and bisoprolol for heart rate control.  On INR check in our Coumadin clinic 04/11/2016, the patient continued to complain of bilateral leg edema, and was prescribed Zaroxolyn 5 mg daily for 3 days to relieve symptoms. BMET is pending.  She comes today feeling much better concerning lower extremity edema. Her weight is essentially unchanged however. She states Zaroxolyn made the legs less edematous, but she does have some mild lower extremity edema now in the dependent position. She has a large amount of varicosities and skin thickening. She denies any bleeding or bruising excessively.  Past Medical History:  Diagnosis Date  . Atrial fibrillation ([place])   . Carotid artery disease ([place])    RIght CEA 05/2004  . Chronic anticoagulation   . Coronary atherosclerosis of native coronary artery    RCA stent in 2003; residual 60% mid Cx and OM2  . Essential hypertension, benign   . Hyperlipidemia   . Obesity   . Renal artery stenosis ([facility])    Bilateral renal artery stents 2004  . Tobacco abuse, in remission    40 pack year stopped in 1989   . Venous insufficiency (chronic) (peripheral)    Pedal edema    Past Surgical History:  Procedure Laterality Date  . APPENDECTOMY    . CAROTID ENDARTERECTOMY     Right in 2005, left in 2010  . COLONOSCOPY  Never   Declines  . COLONOSCOPY N/A 08/19/2015   Procedure: COLONOSCOPY;  Surgeon: Rogene Houston, MD;  [facility];  Service: Endoscopy;  Laterality: N/A;  7:30  . CORONARY ANGIOPLASTY    . ESOPHAGOGASTRODUODENOSCOPY N/A 08/19/2015   Procedure: ESOPHAGOGASTRODUODENOSCOPY (EGD);  Surgeon: Rogene Houston, MD;  [facility];  Service: Endoscopy;  Laterality: N/A;     Current Outpatient Prescriptions  Medication Sig Dispense Refill  . atorvastatin (LIPITOR) 80 MG tablet Take 1 tablet (80 mg total) by mouth at bedtime. 60 tablet 6  . bisoprolol (ZEBETA) 10 MG tablet take 1 tablet by mouth twice a day 60 tablet 6  . furosemide (LASIX) 40 MG tablet Take 80 mg ( 2 tablets) in the am, and take 40 mg ( 1 tablet) in the afternoon 90 tablet 6  . losartan (COZAAR) 100 MG tablet take 1 tablet by mouth once daily 90 tablet 1  . metolazone (ZAROXOLYN) 5 MG tablet Take 1 tablet daily x 3 days (Thursday, Friday and Saturday) then call office with update on swelling in legs. 10 tablet 0  . Pediatric Multivitamins-Iron (FLINTSTONES PLUS IRON PO) Take by mouth 2 (two) times daily.    . potassium chloride SA (K-DUR,KLOR-CON) 20  MEQ tablet take 1 tablet by mouth once daily 30 tablet 11  . terazosin (HYTRIN) 5 MG capsule take 1 capsule by mouth at bedtime 90 capsule 3  . warfarin (COUMADIN) 5 MG tablet Take as directed by Coumadin Clinic 40 tablet 3  . ferrous sulfate 325 (65 FE) MG tablet Take 1 tablet (325 mg total) by mouth daily with breakfast. 30 tablet 11   No current facility-administered medications for this visit.     Allergies:   Diltiazem and Verapamil    Social History:  The patient  reports that she quit smoking about 29 years ago. Her smoking use included  Cigarettes. She has never used smokeless tobacco. She reports that she does not drink alcohol or use drugs.   Family History:  The patient's family history includes Cancer in her mother; Heart disease in her maternal grandmother; Hyperlipidemia in her daughter; Hypertension in her daughter, daughter, and son; Lung cancer in her mother.    ROS: All other systems are reviewed and negative. Unless otherwise mentioned in H&P    PHYSICAL EXAM: VS:  BP (!) 90/50   Pulse 78   Ht 5\' 2"  (1.575 m)   Wt 175 lb (79.4 kg)   SpO2 97%   BMI 32.01 kg/m  , BMI Body mass index is 32.01 kg/m. GEN: Well nourished, well developed, in no acute distress  HEENT: normal  Neck: no JVD, carotid bruits, or masses Cardiac: IRRR; no murmurs, rubs, or gallops,no edema  Respiratory:  clear to auscultation bilaterally, normal work of breathing GI: soft, nontender, nondistended, + BS MS: no deformity or atrophy Large amount of varicosities in the lower extremities bilaterally, venous stasis skin changes along with skin thickening. Some duskiness is noted. Skin: warm and dry, no rash Neuro:  Strength and sensation are intact Psych: euthymic mood, full affect   Recent Labs: 04/24/2016: ALT 17; BUN 52; Creatinine, Ser 1.39; Hemoglobin 8.3; Platelets 166; Potassium 4.1; Sodium 139    Lipid Panel    Component Value Date/Time   CHOL 91 (L) 12/22/2014 1035   TRIG 79 12/22/2014 1035   HDL 34 (L) 12/22/2014 1035   CHOLHDL 2.7 12/22/2014 1035   VLDL 16 12/22/2014 1035   LDLCALC 41 12/22/2014 1035      Wt Readings from Last 3 Encounters:  04/24/16 175 lb (79.4 kg)  02/21/16 175 lb (79.4 kg)  01/31/16 174 lb (78.9 kg)    Echocardiogram 07/07/2015 Left ventricle: The cavity size was normal. Wall thickness was   increased in a pattern of mild LVH. Systolic function was normal.   The estimated ejection fraction was in the range of 60% to 65%. - Mitral valve: There was mild regurgitation. - Left atrium: The atrium  was mildly dilated. - Right ventricle: The cavity size was moderately dilated. - Right atrium: The atrium was moderately dilated. - Atrial septum: No defect or patent foramen ovale was identified. - Tricuspid valve: There was moderate regurgitation. - Pulmonary arteries: PA peak pressure: 42 mm Hg (S).    ASSESSMENT AND PLAN:  1. Chronic diastolic heart failure: Improvement in lower extremity edema with use of Zaroxolyn for 3 days. She is hypotensive today, I will order a  BMET to evaluate kidney function and albumin. I did advise her on salt intake. I will also advised her on support hose. She is wearing knee-high stockings around her ankles which is cutting off circulation and causing some fluid back up into her pretibial area. I have asked her not to  wear those type of hose. She is to wear the knee-high TED hose. She does have a pair with zippers make it easier to place. I've encouraged her to use those. She will not take any more Zaroxolyn until told. She will continue to weigh herself daily.  2. Chronic atrial fibrillation: Heart rate is currently well-controlled. No active bleeding on Coumadin. However her last INR was elevated at 4.1 with adjustments per Edrick Oh RN. I'll check a CBC and INR today with above lab work. INR will be reported to Coumadin clinic.  3. Anemia: CBC is ordered. Lasix hemogram was 9.4. She is on Flintstone multivitamins plus iron. Will give her iron supplement tablets. She will follow up with primary care concerning ongoing management   Current medicines are reviewed at length with the patient today.    Labs/ tests ordered today include:   Orders Placed This Encounter  Procedures  . INR/PT  . CBC with Differential  . Comprehensive Metabolic Panel (CMET)  . Albumin     Disposition:   FU with Dr. [name] on previously scheduled appointment. Signed, Jory Sims, NP  04/24/2016 4:26 PM    [facility] [address],  [place], [place] [address] Phone: [phone]; Fax: [phone]

## 2016-04-24 NOTE — Patient Instructions (Signed)
Your physician recommends that you schedule a follow-up appointment with Dr. [name]  Your physician recommends that you have lab work done today.  Your physician has recommended you make the following change in your medication: Start Iron 325 mg Daily   If you need a refill on your cardiac medications before your next appointment, please call your pharmacy.  Thank you for choosing [facility]!

## 2016-04-25 ENCOUNTER — Telehealth: Payer: Self-pay | Admitting: *Deleted

## 2016-04-25 DIAGNOSIS — Z79899 Other long term (current) drug therapy: Secondary | ICD-10-CM

## 2016-04-25 LAB — POCT INR: INR: 3

## 2016-04-25 NOTE — Telephone Encounter (Signed)
-----   Message from Lendon Colonel, NP sent at 04/24/2016  4:25 PM EST ----- She appears dehydrated on labs. She will need to hold lasix this pm. Begin lasix 40 mg BID starting tomorrow. Encourage fluids tonight. Follow up BMET in one week.

## 2016-04-25 NOTE — Telephone Encounter (Signed)
Labs placed.

## 2016-05-05 ENCOUNTER — Other Ambulatory Visit: Payer: Self-pay | Admitting: Cardiology

## 2016-05-07 ENCOUNTER — Encounter: Payer: Self-pay | Admitting: Cardiology

## 2016-05-07 ENCOUNTER — Ambulatory Visit (INDEPENDENT_AMBULATORY_CARE_PROVIDER_SITE_OTHER): Payer: Medicare Other | Admitting: Cardiology

## 2016-05-07 VITALS — BP 104/64 | HR 114 | Ht 61.0 in | Wt 190.0 lb

## 2016-05-07 DIAGNOSIS — I482 Chronic atrial fibrillation, unspecified: Secondary | ICD-10-CM

## 2016-05-07 DIAGNOSIS — I10 Essential (primary) hypertension: Secondary | ICD-10-CM

## 2016-05-07 DIAGNOSIS — I2510 Atherosclerotic heart disease of native coronary artery without angina pectoris: Secondary | ICD-10-CM

## 2016-05-07 DIAGNOSIS — I5033 Acute on chronic diastolic (congestive) heart failure: Secondary | ICD-10-CM | POA: Diagnosis not present

## 2016-05-07 MED ORDER — METOLAZONE 5 MG PO TABS: ORAL_TABLET | ORAL | 0 refills | Status: DC

## 2016-05-07 MED ORDER — FUROSEMIDE 40 MG PO TABS: ORAL_TABLET | ORAL | 3 refills | Status: DC

## 2016-05-07 MED ORDER — DIGOXIN 125 MCG PO TABS: 0.1250 mg | ORAL_TABLET | Freq: Every day | ORAL | 3 refills | Status: DC

## 2016-05-07 MED ORDER — FUROSEMIDE 40 MG PO TABS: ORAL_TABLET | ORAL | 4 refills | Status: DC

## 2016-05-07 NOTE — Patient Instructions (Signed)
Your physician recommends that you schedule a follow-up appointment in: 3 WEEKS with [name]    Take lasix 80 mg (2 tablets) in the am, and 40 mg in the pm    Take Zaroxolyn 5 mg today ,tomorrow and Wednesday morning and then STOP    START Digoxin 0.125 mg daily    Get blood work Cytogeneticist before fu visit in 3 weeks    Thank you for choosing [facility] !

## 2016-05-07 NOTE — Addendum Note (Signed)
Addended by: Debbora Lacrosse R on: 05/07/2016 03:27 PM   Modules accepted: Orders

## 2016-05-07 NOTE — Progress Notes (Signed)
Cardiology Office Note  Date: 05/07/2016   ID: [name], DOB 05/06/1932, MRN [id number]  PCP: Purvis Kilts, MD  Primary Cardiologist: Rozann Lesches, MD   Chief Complaint  Patient presents with  . Atrial Fibrillation  . Diastolic heart failure    History of Present Illness: [name] is an 83 y.o. female seen recently by Ms. Lawrence NP on January 9. She was doing better at that time in terms of leg edema, however now presents having gained about 15 pounds with worsening leg swelling. Follow-up lab work was obtained at the last visit, outlined below. Hemoglobin was down to 8.3 from 9.8 (had been 8.7 in November 2017), BUN 52 and creatinine 1.4 which had increased with diuresis. Lasix was reduced to 40 mg twice daily. She also has been trying to drink more water.  Today her reviewed her medications and we discussed increasing her Lasix back to 80 mg in the morning and 40 mg in the evening. She also needs better heart rate control of her atrial fibrillation although our choices are limited in light of her current allergies. She reports compliance with bisoprolol.  She continues to follow in the anticoagulation clinic on Coumadin.   Past Medical History:  Diagnosis Date  . Atrial fibrillation ([place])   . Carotid artery disease ([place])    RIght CEA 05/2004  . Chronic anticoagulation   . Coronary atherosclerosis of native coronary artery    RCA stent in 2003; residual 60% mid Cx and OM2  . Essential hypertension, benign   . Hyperlipidemia   . Obesity   . Renal artery stenosis ([facility])    Bilateral renal artery stents 2004  . Tobacco abuse, in remission    40 pack year stopped in 1989  . Venous insufficiency (chronic) (peripheral)    Pedal edema    Past Surgical History:  Procedure Laterality Date  . APPENDECTOMY    . CAROTID ENDARTERECTOMY     Right in 2005, left in 2010  . COLONOSCOPY  Never   Declines  . COLONOSCOPY N/A 08/19/2015   Procedure:  COLONOSCOPY;  Surgeon: Rogene Houston, MD;  [facility];  Service: Endoscopy;  Laterality: N/A;  7:30  . CORONARY ANGIOPLASTY    . ESOPHAGOGASTRODUODENOSCOPY N/A 08/19/2015   Procedure: ESOPHAGOGASTRODUODENOSCOPY (EGD);  Surgeon: Rogene Houston, MD;  [facility];  Service: Endoscopy;  Laterality: N/A;    Current Outpatient Prescriptions  Medication Sig Dispense Refill  . atorvastatin (LIPITOR) 80 MG tablet TAKE 1 TABLET EVERY NIGHT AT BEDTIME 60 tablet 6  . bisoprolol (ZEBETA) 10 MG tablet take 1 tablet by mouth twice a day 60 tablet 6  . ferrous sulfate 325 (65 FE) MG tablet Take 1 tablet (325 mg total) by mouth daily with breakfast. 30 tablet 11  . losartan (COZAAR) 100 MG tablet take 1 tablet by mouth once daily 90 tablet 1  . Pediatric Multivitamins-Iron (FLINTSTONES PLUS IRON PO) Take by mouth 2 (two) times daily.    . potassium chloride SA (K-DUR,KLOR-CON) 20 MEQ tablet take 1 tablet by mouth once daily 30 tablet 11  . terazosin (HYTRIN) 5 MG capsule take 1 capsule by mouth at bedtime 90 capsule 3  . warfarin (COUMADIN) 5 MG tablet Take as directed by Coumadin Clinic 40 tablet 3  . digoxin (LANOXIN) 0.125 MG tablet Take 1 tablet (0.125 mg total) by mouth daily. 90 tablet 3  . furosemide (LASIX) 40 MG tablet Take 80 mg am (2  tablets) am, take 40 mg ( 1 tablet) pm 270 tablet 3  . metolazone (ZAROXOLYN) 5 MG tablet Take 1 tablet daily in the AM FOR THE NEXT 3 DAYS AND THEN stop 3 tablet 0   No current facility-administered medications for this visit.    Allergies:  Diltiazem and Verapamil   Social History: The patient  reports that she quit smoking about 29 years ago. Her smoking use included Cigarettes. She has never used smokeless tobacco. She reports that she does not drink alcohol or use drugs.   ROS:  Please see the history of present illness. Otherwise, complete review of systems is positive for hearing loss.  All other systems are reviewed and negative.    Physical Exam: VS:  BP 104/64   Pulse (!) 114   Ht 5\' 1"  (1.549 m)   Wt 190 lb (86.2 kg)   SpO2 98%   BMI 35.90 kg/m , BMI Body mass index is 35.9 kg/m.  Wt Readings from Last 3 Encounters:  05/07/16 190 lb (86.2 kg)  04/24/16 175 lb (79.4 kg)  02/21/16 175 lb (79.4 kg)    Pleasant elderly woman, no distress.  HEENT: Conjunctiva and lids normal, oropharynx clear.  Neck: Supple, no elevated JVP or carotid bruits, no thyromegaly.  Lungs: Clear to auscultation, nonlabored breathing at rest.  Cardiac: Irregularly irregular, no S3 or significant systolic murmur, no pericardial rub.  Abdomen: Soft, nontender,bowel sounds present.  Extremities: Chronic woody appearing leg edema and venous stasis, distal pulses 1+.  Skin: Warm and dry.  Musculoskeletal: No kyphosis.  Neuropsychiatric: Alert and oriented x3, affect grossly appropriate.  ECG: I personally reviewed the tracing from 07/11/2015 which showed atrial fibrillation with nonspecific ST-T changes.  Recent Labwork: 04/24/2016: ALT 17; AST 27; BUN 52; Creatinine, Ser 1.39; Hemoglobin 8.3; Platelets 166; Potassium 4.1; Sodium 139     Component Value Date/Time   CHOL 91 (L) 12/22/2014 1035   TRIG 79 12/22/2014 1035   HDL 34 (L) 12/22/2014 1035   CHOLHDL 2.7 12/22/2014 1035   VLDL 16 12/22/2014 1035   LDLCALC 41 12/22/2014 1035    Other Studies Reviewed Today:  Echocardiogram 07/07/2015: Study Conclusions  - Left ventricle: The cavity size was normal. Wall thickness was  increased in a pattern of mild LVH. Systolic function was normal.  The estimated ejection fraction was in the range of 60% to 65%. - Mitral valve: There was mild regurgitation. - Left atrium: The atrium was mildly dilated. - Right ventricle: The cavity size was moderately dilated. - Right atrium: The atrium was moderately dilated. - Atrial septum: No defect or patent foramen ovale was identified. - Tricuspid valve: There was moderate  regurgitation. - Pulmonary arteries: PA peak pressure: 42 mm Hg (S).  Assessment and Plan:  1. Worsening leg edema with 15 pound weight gain since last encounter. I reviewed interval chart with medication adjustments and lab work obtained. Plan at this time is to increase Lasix back to 80 mg in the morning and 40 mg in the afternoon. She will take Zaroxolyn 5 g daily for 3 days and then stay on the baseline Lasix regimen. Follow-up arranged within the next few weeks with the med.  2. Chronic atrial fibrillation, heart rate is not well controlled on current dose of bisoprolol. She has allergies to verapamil and diltiazem. We will try low-dose digoxin 0.125 mg daily as additional heart rate control, check digoxin level for next visit.  3. Essential hypertension, blood pressure well controlled today.  4.  CAD status post RCA stent in 2003 with moderate residual circumflex and OM 2 disease that is being managed medically. No active angina.  Current medicines were reviewed with the patient today.   Orders Placed This Encounter  Procedures  . Basic Metabolic Panel (BMET)  . Digoxin level    Disposition: Follow-up in the next few weeks.  Signed, Satira Sark, MD, [facility] 05/07/2016 12:00 PM    [facility]. [address] [place], [place] [address] Phone: [phone]; Fax: [phone]

## 2016-05-08 DIAGNOSIS — I2510 Atherosclerotic heart disease of native coronary artery without angina pectoris: Secondary | ICD-10-CM | POA: Diagnosis not present

## 2016-05-08 DIAGNOSIS — Z6836 Body mass index (BMI) 36.0-36.9, adult: Secondary | ICD-10-CM | POA: Diagnosis not present

## 2016-05-08 DIAGNOSIS — I10 Essential (primary) hypertension: Secondary | ICD-10-CM | POA: Diagnosis not present

## 2016-05-08 DIAGNOSIS — I4891 Unspecified atrial fibrillation: Secondary | ICD-10-CM | POA: Diagnosis not present

## 2016-05-08 DIAGNOSIS — Z1389 Encounter for screening for other disorder: Secondary | ICD-10-CM | POA: Diagnosis not present

## 2016-05-08 DIAGNOSIS — I8390 Asymptomatic varicose veins of unspecified lower extremity: Secondary | ICD-10-CM | POA: Diagnosis not present

## 2016-05-09 ENCOUNTER — Other Ambulatory Visit (INDEPENDENT_AMBULATORY_CARE_PROVIDER_SITE_OTHER): Payer: Self-pay | Admitting: *Deleted

## 2016-05-09 DIAGNOSIS — D508 Other iron deficiency anemias: Secondary | ICD-10-CM | POA: Diagnosis not present

## 2016-05-09 DIAGNOSIS — R609 Edema, unspecified: Secondary | ICD-10-CM

## 2016-05-09 LAB — HEMOGLOBIN AND HEMATOCRIT, BLOOD
HCT: 27.7 % — ABNORMAL LOW (ref 35.0–45.0)
HEMOGLOBIN: 8.3 g/dL — AB (ref 11.7–15.5)

## 2016-05-09 LAB — TSH: TSH: 1.76 mIU/L

## 2016-05-12 ENCOUNTER — Other Ambulatory Visit: Payer: Self-pay | Admitting: Cardiology

## 2016-05-16 ENCOUNTER — Telehealth: Payer: Self-pay | Admitting: *Deleted

## 2016-05-16 NOTE — Telephone Encounter (Signed)
Spoke with daughter Vaughan Basta.  Pt has been up during the night with N/V and diarrhea.  INR appt rescheduled for 05/21/16.

## 2016-05-16 NOTE — Telephone Encounter (Signed)
Please give pt a call, she's sick and won't make her apt today

## 2016-05-21 ENCOUNTER — Encounter (INDEPENDENT_AMBULATORY_CARE_PROVIDER_SITE_OTHER): Payer: Self-pay | Admitting: *Deleted

## 2016-05-21 ENCOUNTER — Telehealth (INDEPENDENT_AMBULATORY_CARE_PROVIDER_SITE_OTHER): Payer: Self-pay | Admitting: *Deleted

## 2016-05-21 ENCOUNTER — Telehealth: Payer: Self-pay | Admitting: *Deleted

## 2016-05-21 ENCOUNTER — Ambulatory Visit (INDEPENDENT_AMBULATORY_CARE_PROVIDER_SITE_OTHER): Payer: Medicare Other | Admitting: *Deleted

## 2016-05-21 ENCOUNTER — Telehealth (INDEPENDENT_AMBULATORY_CARE_PROVIDER_SITE_OTHER): Payer: Self-pay | Admitting: Internal Medicine

## 2016-05-21 DIAGNOSIS — Z5181 Encounter for therapeutic drug level monitoring: Secondary | ICD-10-CM | POA: Diagnosis not present

## 2016-05-21 DIAGNOSIS — D508 Other iron deficiency anemias: Secondary | ICD-10-CM

## 2016-05-21 DIAGNOSIS — I2510 Atherosclerotic heart disease of native coronary artery without angina pectoris: Secondary | ICD-10-CM

## 2016-05-21 DIAGNOSIS — I482 Chronic atrial fibrillation, unspecified: Secondary | ICD-10-CM

## 2016-05-21 DIAGNOSIS — I4891 Unspecified atrial fibrillation: Secondary | ICD-10-CM

## 2016-05-21 LAB — POCT INR: INR: 4.1

## 2016-05-21 MED ORDER — FERROUS SULFATE 325 (65 FE) MG PO TABS: 325.0000 mg | ORAL_TABLET | Freq: Three times a day (TID) | ORAL | 4 refills | Status: DC

## 2016-05-21 NOTE — Telephone Encounter (Signed)
pls call pt's daughter Vaughan Basta @ [phone]

## 2016-05-21 NOTE — Telephone Encounter (Signed)
Rx sent to her pharmacy for Iron

## 2016-05-21 NOTE — Telephone Encounter (Signed)
Dr. Laural Golden wanted her to increase her ferrous sulfate 325 mg to 3 times daily (1 after each meal).    She only has 4 left with no refills.  Please call her in a refill to [facility] this morning  Thanks

## 2016-05-21 NOTE — Telephone Encounter (Signed)
Spoke with Vaughan Basta. Pt is coming for INR check this morning.  Daughter thinks Dr Olevia Perches office wants [place] to get a CBC.  Informed her pt would have to go to lab for CBC and Dr Olevia Perches office would need to put in order.  She verbalized understanding.

## 2016-05-21 NOTE — Progress Notes (Unsigned)
Patient's daughter stopped by this morning to see if we had a plan yet for her Mom.    Dr. Laural Golden called in and we discussed her and he said for her to take 2 Flintstones in am and 2 evening.  She also needs to take ferous sulfate 3 daily 1 each meal.  Daughter knew she was taking a oral iron pill but not sure if this was it.  She will look and let me know.  She repeated this to me and she understand directions.  She said her mom had complained of being dizzy yesterday and some this morning.  She is having PT checked today   She was advised to call me today if any questions and I would make sure Dr. Laural Golden got messages.   Note BP is running 114/48 Pulse 77--they have put phone call in to Dr. [name] office to see about this.

## 2016-05-22 ENCOUNTER — Other Ambulatory Visit (INDEPENDENT_AMBULATORY_CARE_PROVIDER_SITE_OTHER): Payer: Self-pay | Admitting: Internal Medicine

## 2016-05-22 ENCOUNTER — Other Ambulatory Visit (INDEPENDENT_AMBULATORY_CARE_PROVIDER_SITE_OTHER): Payer: Self-pay | Admitting: *Deleted

## 2016-05-22 ENCOUNTER — Inpatient Hospital Stay (HOSPITAL_COMMUNITY)
Admission: EM | Admit: 2016-05-22 | Discharge: 2016-05-25 | DRG: 327 | Disposition: A | Discharge status: Home / Self Care | Payer: Medicare Other | Attending: Family Medicine | Admitting: Family Medicine

## 2016-05-22 ENCOUNTER — Encounter (HOSPITAL_COMMUNITY): Payer: Self-pay | Admitting: Emergency Medicine

## 2016-05-22 DIAGNOSIS — Z7901 Long term (current) use of anticoagulants: Secondary | ICD-10-CM | POA: Diagnosis not present

## 2016-05-22 DIAGNOSIS — I5033 Acute on chronic diastolic (congestive) heart failure: Secondary | ICD-10-CM

## 2016-05-22 DIAGNOSIS — Z79899 Other long term (current) drug therapy: Secondary | ICD-10-CM

## 2016-05-22 DIAGNOSIS — K922 Gastrointestinal hemorrhage, unspecified: Secondary | ICD-10-CM | POA: Diagnosis not present

## 2016-05-22 DIAGNOSIS — R42 Dizziness and giddiness: Secondary | ICD-10-CM | POA: Diagnosis not present

## 2016-05-22 DIAGNOSIS — N183 Chronic kidney disease, stage 3 (moderate): Secondary | ICD-10-CM | POA: Diagnosis present

## 2016-05-22 DIAGNOSIS — I5032 Chronic diastolic (congestive) heart failure: Secondary | ICD-10-CM | POA: Diagnosis present

## 2016-05-22 DIAGNOSIS — I2510 Atherosclerotic heart disease of native coronary artery without angina pectoris: Secondary | ICD-10-CM | POA: Diagnosis present

## 2016-05-22 DIAGNOSIS — I872 Venous insufficiency (chronic) (peripheral): Secondary | ICD-10-CM | POA: Diagnosis present

## 2016-05-22 DIAGNOSIS — D62 Acute posthemorrhagic anemia: Secondary | ICD-10-CM | POA: Diagnosis present

## 2016-05-22 DIAGNOSIS — D649 Anemia, unspecified: Secondary | ICD-10-CM

## 2016-05-22 DIAGNOSIS — N289 Disorder of kidney and ureter, unspecified: Secondary | ICD-10-CM | POA: Diagnosis not present

## 2016-05-22 DIAGNOSIS — I482 Chronic atrial fibrillation, unspecified: Secondary | ICD-10-CM | POA: Diagnosis present

## 2016-05-22 DIAGNOSIS — I130 Hypertensive heart and chronic kidney disease with heart failure and stage 1 through stage 4 chronic kidney disease, or unspecified chronic kidney disease: Secondary | ICD-10-CM | POA: Diagnosis present

## 2016-05-22 DIAGNOSIS — K274 Chronic or unspecified peptic ulcer, site unspecified, with hemorrhage: Secondary | ICD-10-CM

## 2016-05-22 DIAGNOSIS — D508 Other iron deficiency anemias: Secondary | ICD-10-CM

## 2016-05-22 DIAGNOSIS — Z6835 Body mass index (BMI) 35.0-35.9, adult: Secondary | ICD-10-CM

## 2016-05-22 DIAGNOSIS — R262 Difficulty in walking, not elsewhere classified: Secondary | ICD-10-CM

## 2016-05-22 DIAGNOSIS — E785 Hyperlipidemia, unspecified: Secondary | ICD-10-CM | POA: Diagnosis present

## 2016-05-22 DIAGNOSIS — D500 Iron deficiency anemia secondary to blood loss (chronic): Secondary | ICD-10-CM | POA: Diagnosis present

## 2016-05-22 DIAGNOSIS — E669 Obesity, unspecified: Secondary | ICD-10-CM | POA: Diagnosis present

## 2016-05-22 DIAGNOSIS — K31811 Angiodysplasia of stomach and duodenum with bleeding: Secondary | ICD-10-CM | POA: Diagnosis not present

## 2016-05-22 DIAGNOSIS — N179 Acute kidney failure, unspecified: Secondary | ICD-10-CM | POA: Diagnosis present

## 2016-05-22 DIAGNOSIS — R791 Abnormal coagulation profile: Secondary | ICD-10-CM | POA: Diagnosis present

## 2016-05-22 DIAGNOSIS — Z5181 Encounter for therapeutic drug level monitoring: Secondary | ICD-10-CM | POA: Diagnosis not present

## 2016-05-22 DIAGNOSIS — Z9861 Coronary angioplasty status: Secondary | ICD-10-CM

## 2016-05-22 DIAGNOSIS — Z87891 Personal history of nicotine dependence: Secondary | ICD-10-CM

## 2016-05-22 LAB — CBC WITH DIFFERENTIAL/PLATELET
Basophils Absolute: 0 10*3/uL (ref 0.0–0.1)
Basophils Relative: 0 %
Eosinophils Absolute: 0 10*3/uL (ref 0.0–0.7)
Eosinophils Relative: 0 %
HCT: 23.7 % — ABNORMAL LOW (ref 36.0–46.0)
Hemoglobin: 7 g/dL — ABNORMAL LOW (ref 12.0–15.0)
Lymphocytes Relative: 8 %
Lymphs Abs: 0.6 10*3/uL — ABNORMAL LOW (ref 0.7–4.0)
MCH: 27.7 pg (ref 26.0–34.0)
MCHC: 29.5 g/dL — ABNORMAL LOW (ref 30.0–36.0)
MCV: 93.7 fL (ref 78.0–100.0)
Monocytes Absolute: 1 10*3/uL (ref 0.1–1.0)
Monocytes Relative: 14 %
Neutro Abs: 5.5 10*3/uL (ref 1.7–7.7)
Neutrophils Relative %: 77 %
Platelets: 158 10*3/uL (ref 150–400)
RBC: 2.53 MIL/uL — ABNORMAL LOW (ref 3.87–5.11)
RDW: 19.2 % — ABNORMAL HIGH (ref 11.5–15.5)
WBC: 7.1 10*3/uL (ref 4.0–10.5)

## 2016-05-22 LAB — CK: CK TOTAL: 32 U/L — AB (ref 38–234)

## 2016-05-22 LAB — BASIC METABOLIC PANEL
Anion gap: 8 (ref 5–15)
BUN: 49 mg/dL — ABNORMAL HIGH (ref 6–20)
CO2: 25 mmol/L (ref 22–32)
Calcium: 8.9 mg/dL (ref 8.9–10.3)
Chloride: 103 mmol/L (ref 101–111)
Creatinine, Ser: 1.83 mg/dL — ABNORMAL HIGH (ref 0.44–1.00)
GFR calc Af Amer: 29 mL/min — ABNORMAL LOW (ref >60)
GFR calc non Af Amer: 25 mL/min — ABNORMAL LOW (ref >60)
Glucose, Bld: 123 mg/dL — ABNORMAL HIGH (ref 65–99)
Potassium: 4.3 mmol/L (ref 3.5–5.1)
Sodium: 136 mmol/L (ref 135–145)

## 2016-05-22 LAB — POC OCCULT BLOOD, ED: Fecal Occult Bld: POSITIVE — AB

## 2016-05-22 LAB — IRON AND TIBC
Iron: 50 ug/dL (ref 28–170)
Saturation Ratios: 11 % (ref 10.4–31.8)
TIBC: 469 ug/dL — ABNORMAL HIGH (ref 250–450)
UIBC: 419 ug/dL

## 2016-05-22 LAB — FERRITIN: FERRITIN: 24 ng/mL (ref 11–307)

## 2016-05-22 LAB — PROTIME-INR
INR: 4.05
Prothrombin Time: 40.4 seconds — ABNORMAL HIGH (ref 11.4–15.2)

## 2016-05-22 LAB — PREPARE RBC (CROSSMATCH)

## 2016-05-22 LAB — DIGOXIN LEVEL: DIGOXIN LVL: 0.9 ng/mL (ref 0.8–2.0)

## 2016-05-22 MED ORDER — FERROUS SULFATE 325 (65 FE) MG PO TABS
325.0000 mg | ORAL_TABLET | Freq: Three times a day (TID) | ORAL | Status: DC
  Administered 2016-05-22 – 2016-05-23 (×3): 325 mg via ORAL
  Filled 2016-05-22 (×3): qty 1

## 2016-05-22 MED ORDER — WARFARIN - PHARMACIST DOSING INPATIENT: Status: DC

## 2016-05-22 MED ORDER — POTASSIUM CHLORIDE CRYS ER 20 MEQ PO TBCR
20.0000 meq | EXTENDED_RELEASE_TABLET | Freq: Every day | ORAL | Status: DC
  Administered 2016-05-22 – 2016-05-25 (×4): 20 meq via ORAL
  Filled 2016-05-22 (×4): qty 1

## 2016-05-22 MED ORDER — FERROUS SULFATE 325 (65 FE) MG PO TABS
325.0000 mg | ORAL_TABLET | Freq: Every day | ORAL | 11 refills | Status: DC
Start: 2016-05-22 — End: 2016-05-22

## 2016-05-22 MED ORDER — FUROSEMIDE 80 MG PO TABS
80.0000 mg | ORAL_TABLET | Freq: Every day | ORAL | Status: DC
  Administered 2016-05-23: 80 mg via ORAL
  Filled 2016-05-22: qty 1

## 2016-05-22 MED ORDER — SODIUM CHLORIDE 0.9 % IV BOLUS (SEPSIS)
500.0000 mL | Freq: Once | INTRAVENOUS | Status: AC
  Administered 2016-05-22: 500 mL via INTRAVENOUS

## 2016-05-22 MED ORDER — ACETAMINOPHEN 325 MG PO TABS: 650.0000 mg | ORAL_TABLET | Freq: Four times a day (QID) | ORAL | Status: DC | PRN

## 2016-05-22 MED ORDER — FUROSEMIDE 40 MG PO TABS
40.0000 mg | ORAL_TABLET | Freq: Every day | ORAL | Status: DC
Start: 2016-05-22 — End: 2016-05-23
  Administered 2016-05-22 – 2016-05-23 (×2): 40 mg via ORAL
  Filled 2016-05-22 (×2): qty 1

## 2016-05-22 MED ORDER — SODIUM CHLORIDE 0.9 % IV SOLN
Freq: Once | INTRAVENOUS | Status: AC
  Administered 2016-05-22: 14:00:00 via INTRAVENOUS

## 2016-05-22 MED ORDER — PANTOPRAZOLE SODIUM 40 MG IV SOLR
80.0000 mg | Freq: Once | INTRAVENOUS | Status: AC
  Administered 2016-05-22: 80 mg via INTRAVENOUS
  Filled 2016-05-22: qty 80

## 2016-05-22 MED ORDER — DIGOXIN 125 MCG PO TABS
0.1250 mg | ORAL_TABLET | Freq: Every day | ORAL | Status: DC
  Administered 2016-05-22 – 2016-05-25 (×4): 0.125 mg via ORAL
  Filled 2016-05-22 (×4): qty 1

## 2016-05-22 MED ORDER — SODIUM CHLORIDE 0.9 % IV SOLN: Freq: Once | INTRAVENOUS | Status: DC

## 2016-05-22 MED ORDER — FERROUS SULFATE 325 (65 FE) MG PO TABS: 325.0000 mg | ORAL_TABLET | Freq: Three times a day (TID) | ORAL | 5 refills | Status: DC

## 2016-05-22 MED ORDER — ATORVASTATIN CALCIUM 40 MG PO TABS
80.0000 mg | ORAL_TABLET | Freq: Every day | ORAL | Status: DC
  Administered 2016-05-22 – 2016-05-24 (×3): 80 mg via ORAL
  Filled 2016-05-22 (×3): qty 2

## 2016-05-22 MED ORDER — ADULT MULTIVITAMIN W/MINERALS CH
1.0000 | ORAL_TABLET | Freq: Every day | ORAL | Status: DC
  Administered 2016-05-23 – 2016-05-25 (×3): 1 via ORAL
  Filled 2016-05-22 (×3): qty 1

## 2016-05-22 MED ORDER — ONDANSETRON HCL 4 MG/2ML IJ SOLN: 4.0000 mg | Freq: Four times a day (QID) | INTRAMUSCULAR | Status: DC | PRN

## 2016-05-22 MED ORDER — VITAMIN C 500 MG PO TABS
500.0000 mg | ORAL_TABLET | Freq: Two times a day (BID) | ORAL | Status: DC
  Administered 2016-05-22 – 2016-05-25 (×6): 500 mg via ORAL
  Filled 2016-05-22 (×6): qty 1

## 2016-05-22 MED ORDER — ACETAMINOPHEN 650 MG RE SUPP
650.0000 mg | Freq: Four times a day (QID) | RECTAL | Status: DC | PRN
Start: 2016-05-22 — End: 2016-05-25

## 2016-05-22 MED ORDER — BOOST / RESOURCE BREEZE PO LIQD
1.0000 | Freq: Three times a day (TID) | ORAL | Status: DC
  Administered 2016-05-23: 1 via ORAL

## 2016-05-22 MED ORDER — ONDANSETRON HCL 4 MG PO TABS: 4.0000 mg | ORAL_TABLET | Freq: Four times a day (QID) | ORAL | Status: DC | PRN

## 2016-05-22 MED ORDER — FLINTSTONES PLUS IRON PO CHEW: CHEWABLE_TABLET | Freq: Two times a day (BID) | ORAL | Status: DC

## 2016-05-22 NOTE — H&P (Signed)
History and Physical  [name] [id number] DOB: 1932-06-09 DOA: 05/22/2016   PCP: Purvis Kilts, MD   Patient coming from: Home  Chief Complaint: dizziness  HPI:  [name] is a 83 y.o. female with medical history of atrial fibrillation, diastolic CHF, angiodysplasia of the colon, hypertension, hyperlipidemia, coronary artery disease presented with 1 month history of dizziness that has worsened in the past week. The patient was at the lab getting her blood drawn when it was noticed that the patient was pale, and the patient was sent to the emergency department for further evaluation. The patient denies any fevers, chills, chest pain, shortness breath, syncope. The patient has had a history of iron deficiency anemia, and last week her GI physician told her to increase her iron to TID and increase Flinstones with iron to 2 tabs bid. The patient has not started this yet. She denies any nausea, vomiting, diarrhea, abdominal pain. The patient has chronic melanotic stools, but she takes iron. She denies any hematochezia, hematemesis, hemoptysis, hematuria, epistaxis.  In the emergency department, the patient was afebrile and hemodynamically stable with saturation of 96-100 percent on room air. Serum creatinine was 1.89 with a baseline of 0.9-1.3. CBC was unremarkable except for hemoglobin of 7.0. One unit of PRBC was ordered. Protonix was given in the emergency department.  Assessment/Plan: Symptomatically anemia/chronic blood loss anemia -rectal exam in ED by EDP did not have any bright red blood -FOBT positive confounded by pt on iron -Transfuse one additional unit to that ordered by ED (2 units total) -Check iron studies and transfuse IV as needed -Start ferrous sulfate 325 mg twice a day with vitamin C to help improve absorption -08/19/2015 colonoscopy showed 3 angioectasias in the ascending colon -08/19/2015 EGD nonbleeding gastric ulcer -Consult GI -Clear  liquid diet for now  Coagulopathy -INR 4.05 -as there is not overt bleeding and pt is hemodynamically stable, will allow INR to drift down  Chronic atrial fibrillation -Hold Coumadin -check digoxin level -rate controlled  Chronic diastolic CHF -Lasix dose was recently increased on 05/07/2016 by cardiology -In the setting of acute on chronic renal failure, hold Lasix today and resume home dose in the morning 05/23/2016 -Normally on Lasix 80 mg a.m., 40 mg p.m. -Daily weights -07/07/2015 echo EF 60-65%, PASP 42  Acute on chronic renal failure--CKD 3 -Baseline creatinine 0.9-1.3 -Presenting creatinine 1.89 -Discontinue losartan  Hypertension -Restart lower dose bisoprolol secondary to soft blood pressure -Discontinue losartan and Hytrin secondary to soft blood pressure  Coronary artery disease -No anginal symptoms -EKG with nonspecific ST-T wave changes -Continue lower dose bisoprolol  Hyperlipidemia -Continue statin    Past Medical History:  Diagnosis Date  . Atrial fibrillation ([place])   . Carotid artery disease ([place])    RIght CEA 05/2004  . Chronic anticoagulation   . Coronary atherosclerosis of native coronary artery    RCA stent in 2003; residual 60% mid Cx and OM2  . Essential hypertension, benign   . Hyperlipidemia   . Obesity   . Renal artery stenosis ([facility])    Bilateral renal artery stents 2004  . Tobacco abuse, in remission    40 pack year stopped in 1989  . Venous insufficiency (chronic) (peripheral)    Pedal edema   Past Surgical History:  Procedure Laterality Date  . APPENDECTOMY    . CAROTID ENDARTERECTOMY     Right in 2005, left in 2010  . COLONOSCOPY  Never   Declines  .  COLONOSCOPY N/A 08/19/2015   Procedure: COLONOSCOPY;  Surgeon: Rogene Houston, MD;  [facility];  Service: Endoscopy;  Laterality: N/A;  7:30  . CORONARY ANGIOPLASTY    . ESOPHAGOGASTRODUODENOSCOPY N/A 08/19/2015   Procedure: ESOPHAGOGASTRODUODENOSCOPY (EGD);   Surgeon: Rogene Houston, MD;  Location[facility];  Service: Endoscopy;  Laterality: N/A;   Social History:  reports that she quit smoking about 29 years ago. Her smoking use included Cigarettes. She has never used smokeless tobacco. She reports that she does not drink alcohol or use drugs.   Family History  Problem Relation Age of Onset  . Lung cancer Mother   . Cancer Mother   . Hypertension Daughter   . Hyperlipidemia Daughter   . Hypertension Son   . Hypertension Daughter   . Heart disease Maternal Grandmother      Allergies  Allergen Reactions  . Diltiazem Other (See Comments)    Edema  . Verapamil Other (See Comments)    Diarrhea     Prior to Admission medications   Medication Sig Start Date End Date Taking? Authorizing Provider  atorvastatin (LIPITOR) 80 MG tablet TAKE 1 TABLET EVERY NIGHT AT BEDTIME 05/07/16   Satira Sark, MD  bisoprolol (ZEBETA) 10 MG tablet take 1 tablet by mouth twice a day 02/09/16   Satira Sark, MD  digoxin (LANOXIN) 0.125 MG tablet Take 1 tablet (0.125 mg total) by mouth daily. 05/07/16   Satira Sark, MD  ferrous sulfate 325 (65 FE) MG tablet Take 1 tablet (325 mg total) by mouth daily with breakfast. 05/22/16   Rogene Houston, MD  ferrous sulfate 325 (65 FE) MG tablet Take 1 tablet (325 mg total) by mouth 3 (three) times daily with meals. 05/22/16   Rogene Houston, MD  furosemide (LASIX) 40 MG tablet Take 80 mg am (2 tablets) am, take 40 mg ( 1 tablet) pm 05/07/16   Satira Sark, MD  losartan (COZAAR) 100 MG tablet take 1 tablet by mouth once daily 04/05/16   Satira Sark, MD  metolazone (ZAROXOLYN) 5 MG tablet Take 1 tablet daily in the AM FOR THE NEXT 3 DAYS AND THEN stop 05/07/16   Satira Sark, MD  Pediatric Multivitamins-Iron (FLINTSTONES PLUS IRON PO) Take by mouth 2 (two) times daily.    Historical Provider, MD  potassium chloride SA (K-DUR,KLOR-CON) 20 MEQ tablet take 1 tablet by mouth once daily 02/09/16    Satira Sark, MD  terazosin (HYTRIN) 5 MG capsule take 1 capsule by mouth at bedtime 09/08/15   Satira Sark, MD  warfarin (COUMADIN) 5 MG tablet Take as directed by [facility] 01/27/16   Satira Sark, MD    Review of Systems:  Constitutional:  No weight loss, night sweats, Fevers, chills, fatigue.  Head&Eyes: No headache.  No vision loss.  No eye pain or scotoma ENT:  No Difficulty swallowing,Tooth/dental problems,Sore throat,  No ear ache, post nasal drip,  Cardio-vascular:  No chest pain, Orthopnea, PND, dizziness, palpitations  GI:  No  abdominal pain, nausea, vomiting, diarrhea, loss of appetite, hematochezia, melena, heartburn, indigestion, Resp:  No shortness of breath with exertion or at rest. No cough. No coughing up of blood .No wheezing.No chest wall deformity  Skin:  no rash or lesions.  GU:  no dysuria, change in color of urine, no urgency or frequency. No flank pain.  Musculoskeletal:  No joint pain or swelling. No decreased range of motion. No back  pain.  Psych:  No change in mood or affect. No depression or anxiety. Neurologic: No headache, no dysesthesia, no focal weakness, no vision loss. No syncope  Physical Exam: Vitals:   05/22/16 1200 05/22/16 1230 05/22/16 1300 05/22/16 1330  BP: 119/55 (!) 107/38 (!) 100/45 (!) 97/34  Pulse: 72 69 (!) 48 85  Resp: 18 16 20 16   Temp:      SpO2: 97% 93% 97% 95%  Weight:      Height:       General:  A&O x 3, NAD, nontoxic, pleasant/cooperative Head/Eye: No conjunctival hemorrhage, no icterus, [place]/AT, No nystagmus ENT:  No icterus,  No thrush, good dentition, no pharyngeal exudate Neck:  No masses, no lymphadenpathy, no bruits CV:  RRR, no rub, no gallop, no S3 Lung:  CTAB, good air movement, no wheeze, no rhonchi Abdomen: soft/NT, +BS, nondistended, no peritoneal signs Ext: No cyanosis, No rashes, No petechiae, No lymphangitis, 2 + LE edema Neuro: CNII-XII intact, strength 4/5 in bilateral upper  and lower extremities, no dysmetria  Labs on Admission:  Basic Metabolic Panel:  Recent Labs Lab 05/22/16 1045  NA 136  K 4.3  CL 103  CO2 25  GLUCOSE 123*  BUN 49*  CREATININE 1.83*  CALCIUM 8.9   Liver Function Tests: No results for input(s): AST, ALT, ALKPHOS, BILITOT, PROT, ALBUMIN in the last 168 hours. No results for input(s): LIPASE, AMYLASE in the last 168 hours. No results for input(s): AMMONIA in the last 168 hours. CBC:  Recent Labs Lab 05/22/16 1045  WBC 7.1  NEUTROABS 5.5  HGB 7.0*  HCT 23.7*  MCV 93.7  PLT 158   Coagulation Profile:  Recent Labs Lab 05/21/16 1053 05/22/16 1045  INR 4.1 4.05*   Cardiac Enzymes: No results for input(s): CKTOTAL, CKMB, CKMBINDEX, TROPONINI in the last 168 hours. BNP: Invalid input(s): POCBNP CBG: No results for input(s): GLUCAP in the last 168 hours. Urine analysis:    Component Value Date/Time   COLORURINE YELLOW 08/31/2009 1628   APPEARANCEUR CLEAR 08/31/2009 1628   LABSPEC 1.025 08/31/2009 1628   PHURINE 5.0 08/31/2009 1628   GLUCOSEU NEGATIVE 08/31/2009 1628   HGBUR NEGATIVE 08/31/2009 1628   BILIRUBINUR NEGATIVE 08/31/2009 1628   KETONESUR 15 (A) 08/31/2009 1628   PROTEINUR NEGATIVE 08/31/2009 1628   UROBILINOGEN 0.2 08/31/2009 1628   NITRITE NEGATIVE 08/31/2009 1628   LEUKOCYTESUR  08/31/2009 1628    NEGATIVE MICROSCOPIC NOT DONE ON URINES WITH NEGATIVE PROTEIN, BLOOD, LEUKOCYTES, NITRITE, OR GLUCOSE <1000 mg/dL.   Sepsis Labs: @LABRCNTIP (procalcitonin:4,lacticidven:4) )No results found for this or any previous visit (from the past 240 hour(s)).   Radiological Exams on Admission: No results found.  EKG: Independently reviewed. Atrial fibrillation, nonspecific ST-T wave changes.    Time spent:60 minutes Code Status:   FULL Family Communication:  No Family at bedside Disposition Plan: expect 1-2 day hospitalization Consults called: GI DVT Prophylaxis: SCDs  Greysyn Vanderberg, DO  [facility] Pager [phone]  If 7PM-7AM, please contact night-coverage [email or web address] Password [email or web address] 05/22/2016, 2:03 PM

## 2016-05-22 NOTE — ED Notes (Signed)
Consent for blood transfusion signed, witness, and at bedside.

## 2016-05-22 NOTE — ED Notes (Signed)
CRITICAL VALUE ALERT  Critical value received:  INR - 4.05  Date of notification:  05/23/2015  Time of notification:  1133  Critical value read back: yes  Nurse who received alert:  LJS  MD notified (1st page):  Dr Wilson Singer  Time of first page:  1133  MD notified (2nd page):  Time of second page:  Responding MD:  Dr Wilson Singer  Time MD responded:  1134

## 2016-05-22 NOTE — ED Triage Notes (Signed)
Patient sent here by Dr. Olevia Perches office. Patient presented this morning for lab draw and was told to come to ED. Patient takes coumadin. Dr. Laural Golden states patient my need blood transfusion.

## 2016-05-22 NOTE — Telephone Encounter (Signed)
This has been addressed.

## 2016-05-22 NOTE — Progress Notes (Signed)
Patient presented to [facility] Lab personnel called our office and advised that the patient did not look good , felt they should hold off drawing blood as she may need to go to the ED. Dr.Rehman was made aware, and he advised that the patient go to the ED and that h e would call the ED Physician. Patient was transported to the ED by her daughter.

## 2016-05-22 NOTE — ED Provider Notes (Signed)
[facility] DEPT Provider Note   CSN: [id number] Arrival date & time: 05/22/16  [id number]  By signing my name below, I, Sonum Patel, attest that this documentation has been prepared under the direction and in the presence of Virgel Manifold, MD. Electronically Signed: Sonum Patel, Education administrator. 05/22/16. 11:44 AM.  History   Chief Complaint Chief Complaint  Patient presents with  . Abnormal Lab    The history is provided by the patient and a relative. No language interpreter was used.     HPI Comments: [name] is a 83 y.o. female who presents to the Emergency Department complaining of persistent fatigue that has gradually worsened over the last several days. She states this is worse with exertion at which point she has mild SOB. She was seen in Rehman's office this morning and was sent here due to anemia and possible need to blood transfusion. She reports increased lower leg swelling and states her prescribed furosemide was recently increased. She reports recent watery stools and describes it is black due to iron supplements. She has a history of Afib for which she takes Coumadin.   Past Medical History:  Diagnosis Date  . Atrial fibrillation ([place])   . Carotid artery disease ([place])    RIght CEA 05/2004  . Chronic anticoagulation   . Coronary atherosclerosis of native coronary artery    RCA stent in 2003; residual 60% mid Cx and OM2  . Essential hypertension, benign   . Hyperlipidemia   . Obesity   . Renal artery stenosis ([facility])    Bilateral renal artery stents 2004  . Tobacco abuse, in remission    40 pack year stopped in 1989  . Venous insufficiency (chronic) (peripheral)    Pedal edema    Patient Active Problem List   Diagnosis Date Noted  . Symptomatic anemia 07/11/2015  . Microcytic anemia 07/11/2015  . Fatigue 07/11/2015  . Occlusion and stenosis of carotid artery without mention of cerebral infarction-Bilateral 11/11/2013  . Aftercare following surgery of the  circulatory system, [place] 11/11/2013  . Varicose veins of lower extremities with other complications [id number]  . Encounter for therapeutic drug monitoring 05/18/2013  . Fasting hyperglycemia 07/03/2012  . Chronic anticoagulation   . Tobacco abuse, in remission   . Carotid artery occlusion   . Hypertension   . Obesity   . Hyperlipidemia   . Chronic venous insufficiency 07/26/2010  . Chronic atrial fibrillation ([place]) 04/05/2010  . Coronary atherosclerosis of native coronary artery 04/05/2010  . PERIPHERAL VASCULAR DISEASE 04/05/2010    Past Surgical History:  Procedure Laterality Date  . APPENDECTOMY    . CAROTID ENDARTERECTOMY     Right in 2005, left in 2010  . COLONOSCOPY  Never   Declines  . COLONOSCOPY N/A 08/19/2015   Procedure: COLONOSCOPY;  Surgeon: Rogene Houston, MD;  [facility];  Service: Endoscopy;  Laterality: N/A;  7:30  . CORONARY ANGIOPLASTY    . ESOPHAGOGASTRODUODENOSCOPY N/A 08/19/2015   Procedure: ESOPHAGOGASTRODUODENOSCOPY (EGD);  Surgeon: Rogene Houston, MD;  [facility];  Service: Endoscopy;  Laterality: N/A;    OB History    No data available       Home Medications    Prior to Admission medications   Medication Sig Start Date End Date Taking? Authorizing Provider  atorvastatin (LIPITOR) 80 MG tablet TAKE 1 TABLET EVERY NIGHT AT BEDTIME 05/07/16   Satira Sark, MD  bisoprolol (ZEBETA) 10 MG tablet take 1 tablet by mouth twice a day  02/09/16   Satira Sark, MD  digoxin (LANOXIN) 0.125 MG tablet Take 1 tablet (0.125 mg total) by mouth daily. 05/07/16   Satira Sark, MD  ferrous sulfate 325 (65 FE) MG tablet Take 1 tablet (325 mg total) by mouth daily with breakfast. 05/22/16   Rogene Houston, MD  ferrous sulfate 325 (65 FE) MG tablet Take 1 tablet (325 mg total) by mouth 3 (three) times daily with meals. 05/22/16   Rogene Houston, MD  furosemide (LASIX) 40 MG tablet Take 80 mg am (2 tablets) am, take 40 mg ( 1 tablet) pm  05/07/16   Satira Sark, MD  losartan (COZAAR) 100 MG tablet take 1 tablet by mouth once daily 04/05/16   Satira Sark, MD  metolazone (ZAROXOLYN) 5 MG tablet Take 1 tablet daily in the AM FOR THE NEXT 3 DAYS AND THEN stop 05/07/16   Satira Sark, MD  Pediatric Multivitamins-Iron (FLINTSTONES PLUS IRON PO) Take by mouth 2 (two) times daily.    Historical Provider, MD  potassium chloride SA (K-DUR,KLOR-CON) 20 MEQ tablet take 1 tablet by mouth once daily 02/09/16   Satira Sark, MD  terazosin (HYTRIN) 5 MG capsule take 1 capsule by mouth at bedtime 09/08/15   Satira Sark, MD  warfarin (COUMADIN) 5 MG tablet Take as directed by [facility] 01/27/16   Satira Sark, MD    Family History Family History  Problem Relation Age of Onset  . Lung cancer Mother   . Cancer Mother   . Hypertension Daughter   . Hyperlipidemia Daughter   . Hypertension Son   . Hypertension Daughter   . Heart disease Maternal Grandmother     Social History Social History  Substance Use Topics  . Smoking status: Former Smoker    Types: Cigarettes    Quit date: 04/30/1987  . Smokeless tobacco: Never Used  . Alcohol use No     Allergies   Diltiazem and Verapamil   Review of Systems Review of Systems  A complete 10 system review of systems was obtained and all systems are negative except as noted in the HPI and PMH.    Physical Exam Updated Vital Signs BP (!) 100/40 (BP Location: Left Arm)   Pulse 87   Temp 97.5 F (36.4 C)   Resp 16   Ht 5\' 1"  (1.549 m)   Wt 190 lb (86.2 kg)   SpO2 100%   BMI 35.90 kg/m   Physical Exam  Constitutional: She is oriented to person, place, and time. She appears well-developed and well-nourished. No distress.  HENT:  Head: Normocephalic and atraumatic.  Eyes: EOM are normal.  Neck: Normal range of motion.  Cardiovascular: Normal rate and normal heart sounds.  An irregularly irregular rhythm present.  Pulmonary/Chest: Effort normal  and breath sounds normal.  Abdominal: Soft. She exhibits no distension. There is no tenderness.  Genitourinary:  Genitourinary Comments: Chaperone present. Rusty colored stool. Strongly heme positive.   Musculoskeletal: Normal range of motion.  Neurological: She is alert and oriented to person, place, and time.  Skin: Skin is warm and dry.  Psychiatric: She has a normal mood and affect. Judgment normal.  Nursing note and vitals reviewed.    ED Treatments / Results  DIAGNOSTIC STUDIES: Oxygen Saturation is 100% on RA, normal by my interpretation.    COORDINATION OF CARE: 11:42 AM Discussed treatment plan with pt at bedside and pt agreed to plan.   Labs (all labs ordered  are listed, but only abnormal results are displayed) Labs Reviewed  CBC WITH DIFFERENTIAL/PLATELET - Abnormal; Notable for the following:       Result Value   RBC 2.53 (*)    Hemoglobin 7.0 (*)    HCT 23.7 (*)    MCHC 29.5 (*)    RDW 19.2 (*)    Lymphs Abs 0.6 (*)    All other components within normal limits  BASIC METABOLIC PANEL - Abnormal; Notable for the following:    Glucose, Bld 123 (*)    BUN 49 (*)    Creatinine, Ser 1.83 (*)    GFR calc non Af Amer 25 (*)    GFR calc Af Amer 29 (*)    All other components within normal limits  PROTIME-INR - Abnormal; Notable for the following:    Prothrombin Time 40.4 (*)    INR 4.05 (*)    All other components within normal limits  POC OCCULT BLOOD, ED - Abnormal; Notable for the following:    Fecal Occult Bld POSITIVE (*)    All other components within normal limits  OCCULT BLOOD X 1 CARD TO LAB, STOOL  TYPE AND SCREEN    EKG  EKG Interpretation  Date/Time:  Tuesday May 22 2016 11:17:53 EST Ventricular Rate:  74 PR Interval:    QRS Duration: 103 QT Interval:  335 QTC Calculation: 372 R Axis:   63 Text Interpretation:  Atrial fibrillation Low voltage, precordial leads RSR' in V1 or V2, right VCD or RVH Abnormal T, consider ischemia, lateral  leads Confirmed by Wilson Singer  MD, Laurette Villescas [id number]) on 05/22/2016 12:25:49 PM       Radiology No results found.  Procedures Procedures (including critical care time)  CRITICAL CARE Performed by: Virgel Manifold Total critical care time:35 minutes Critical care time was exclusive of separately billable procedures and treating other patients. Critical care was necessary to treat or prevent imminent or life-threatening deterioration. Critical care was time spent personally by me on the following activities: development of treatment plan with patient and/or surrogate as well as nursing, discussions with consultants, evaluation of patient's response to treatment, examination of patient, obtaining history from patient or surrogate, ordering and performing treatments and interventions, ordering and review of laboratory studies, ordering and review of radiographic studies, pulse oximetry and re-evaluation of patient's condition.   Medications Ordered in ED Medications - No data to display   Initial Impression / Assessment and Plan / ED Course  I have reviewed the triage vital signs and the nursing notes.  Pertinent labs & imaging results that were available during my care of the patient were reviewed by me and considered in my medical decision making (see chart for details).     83yF with symptomatic anemia. On coumadin. Heme positive stool. Will transfuse. BP a little soft, but no complaints while at rest. AKI. IVF. Decision for Vit K/FFP decision deferred to admitting team.   Final Clinical Impressions(s) / ED Diagnoses   Final diagnoses:  Symptomatic anemia  Gastrointestinal hemorrhage associated with peptic ulcer  Anticoagulated on Coumadin  Renal impairment    New Prescriptions New Prescriptions   No medications on file   I personally preformed the services scribed in my presence. The recorded information has been reviewed is accurate. Virgel Manifold, MD.    Virgel Manifold,  MD 05/22/16 [id number]

## 2016-05-22 NOTE — Progress Notes (Signed)
ANTICOAGULATION CONSULT NOTE - Initial Consult  Pharmacy Consult for Warfarin Indication: atrial fibrillation  Allergies  Allergen Reactions  . Diltiazem Other (See Comments)    Edema  . Verapamil Other (See Comments)    Diarrhea    Patient Measurements: Height: 5\' 1"  (154.9 cm) Weight: 190 lb (86.2 kg) IBW/kg (Calculated) : 47.8 Heparin Dosing Weight:   Vital Signs: Temp: 97.6 F (36.4 C) (02/06 1511) Temp Source: Oral (02/06 1511) BP: 98/37 (02/06 1511) Pulse Rate: 63 (02/06 1511)  Labs:  Recent Labs  05/21/16 1053 05/22/16 1045  HGB  --  7.0*  HCT  --  23.7*  PLT  --  158  LABPROT  --  40.4*  INR 4.1 4.05*  CREATININE  --  1.83*    Estimated Creatinine Clearance: 24.1 mL/min (by C-G formula based on SCr of 1.83 mg/dL (H)).   Medical History: Past Medical History:  Diagnosis Date  . Atrial fibrillation ([place])   . Carotid artery disease ([place])    RIght CEA 05/2004  . Chronic anticoagulation   . Coronary atherosclerosis of native coronary artery    RCA stent in 2003; residual 60% mid Cx and OM2  . Essential hypertension, benign   . Hyperlipidemia   . Obesity   . Renal artery stenosis ([facility])    Bilateral renal artery stents 2004  . Tobacco abuse, in remission    40 pack year stopped in 1989  . Venous insufficiency (chronic) (peripheral)    Pedal edema    Medications:   (Not in a hospital admission)  Assessment: 83 y.o. female with medical history of atrial fibrillation. Symptomatically anemia/chronic blood loss anemia. Hemoglobin low on admission, one unit PRBC ordered. INR elevated on admission.  MD note hold coumadin.  Goal of Therapy:  INR 2-3 Monitor platelets by anticoagulation protocol: Yes   Plan:  No Coumadin tonight due to elevated INR INR/PT daily Monitor CBC, platelets, signs of bleeding  [name], [name] 05/22/2016,3:18 PM

## 2016-05-22 NOTE — ED Notes (Addendum)
Gave patient meal tray as requested and approved by Dr Wilson Singer.

## 2016-05-22 NOTE — Telephone Encounter (Signed)
Done

## 2016-05-23 DIAGNOSIS — N289 Disorder of kidney and ureter, unspecified: Secondary | ICD-10-CM

## 2016-05-23 DIAGNOSIS — K921 Melena: Secondary | ICD-10-CM | POA: Diagnosis not present

## 2016-05-23 DIAGNOSIS — E785 Hyperlipidemia, unspecified: Secondary | ICD-10-CM | POA: Diagnosis present

## 2016-05-23 DIAGNOSIS — I872 Venous insufficiency (chronic) (peripheral): Secondary | ICD-10-CM | POA: Diagnosis not present

## 2016-05-23 DIAGNOSIS — R42 Dizziness and giddiness: Secondary | ICD-10-CM | POA: Diagnosis not present

## 2016-05-23 DIAGNOSIS — I482 Chronic atrial fibrillation: Secondary | ICD-10-CM

## 2016-05-23 DIAGNOSIS — Z9861 Coronary angioplasty status: Secondary | ICD-10-CM | POA: Diagnosis not present

## 2016-05-23 DIAGNOSIS — E669 Obesity, unspecified: Secondary | ICD-10-CM | POA: Diagnosis present

## 2016-05-23 DIAGNOSIS — D62 Acute posthemorrhagic anemia: Secondary | ICD-10-CM | POA: Diagnosis not present

## 2016-05-23 DIAGNOSIS — Z6835 Body mass index (BMI) 35.0-35.9, adult: Secondary | ICD-10-CM | POA: Diagnosis not present

## 2016-05-23 DIAGNOSIS — K31811 Angiodysplasia of stomach and duodenum with bleeding: Principal | ICD-10-CM

## 2016-05-23 DIAGNOSIS — R262 Difficulty in walking, not elsewhere classified: Secondary | ICD-10-CM | POA: Diagnosis not present

## 2016-05-23 DIAGNOSIS — R195 Other fecal abnormalities: Secondary | ICD-10-CM

## 2016-05-23 DIAGNOSIS — D500 Iron deficiency anemia secondary to blood loss (chronic): Secondary | ICD-10-CM | POA: Diagnosis not present

## 2016-05-23 DIAGNOSIS — I5032 Chronic diastolic (congestive) heart failure: Secondary | ICD-10-CM

## 2016-05-23 DIAGNOSIS — I130 Hypertensive heart and chronic kidney disease with heart failure and stage 1 through stage 4 chronic kidney disease, or unspecified chronic kidney disease: Secondary | ICD-10-CM | POA: Diagnosis present

## 2016-05-23 DIAGNOSIS — R791 Abnormal coagulation profile: Secondary | ICD-10-CM | POA: Diagnosis present

## 2016-05-23 DIAGNOSIS — K274 Chronic or unspecified peptic ulcer, site unspecified, with hemorrhage: Secondary | ICD-10-CM

## 2016-05-23 DIAGNOSIS — K922 Gastrointestinal hemorrhage, unspecified: Secondary | ICD-10-CM

## 2016-05-23 DIAGNOSIS — Z5181 Encounter for therapeutic drug level monitoring: Secondary | ICD-10-CM | POA: Diagnosis not present

## 2016-05-23 DIAGNOSIS — N183 Chronic kidney disease, stage 3 (moderate): Secondary | ICD-10-CM | POA: Diagnosis not present

## 2016-05-23 DIAGNOSIS — D649 Anemia, unspecified: Secondary | ICD-10-CM

## 2016-05-23 DIAGNOSIS — Z87891 Personal history of nicotine dependence: Secondary | ICD-10-CM | POA: Diagnosis not present

## 2016-05-23 DIAGNOSIS — I2510 Atherosclerotic heart disease of native coronary artery without angina pectoris: Secondary | ICD-10-CM | POA: Diagnosis not present

## 2016-05-23 DIAGNOSIS — Z7901 Long term (current) use of anticoagulants: Secondary | ICD-10-CM

## 2016-05-23 DIAGNOSIS — Z79899 Other long term (current) drug therapy: Secondary | ICD-10-CM | POA: Diagnosis not present

## 2016-05-23 DIAGNOSIS — N179 Acute kidney failure, unspecified: Secondary | ICD-10-CM | POA: Diagnosis not present

## 2016-05-23 DIAGNOSIS — Z8711 Personal history of peptic ulcer disease: Secondary | ICD-10-CM | POA: Diagnosis not present

## 2016-05-23 LAB — BASIC METABOLIC PANEL
ANION GAP: 8 (ref 5–15)
BUN: 40 mg/dL — ABNORMAL HIGH (ref 6–20)
CALCIUM: 8.6 mg/dL — AB (ref 8.9–10.3)
CO2: 25 mmol/L (ref 22–32)
Chloride: 104 mmol/L (ref 101–111)
Creatinine, Ser: 1.49 mg/dL — ABNORMAL HIGH (ref 0.44–1.00)
GFR, EST AFRICAN AMERICAN: 37 mL/min — AB (ref >60)
GFR, EST NON AFRICAN AMERICAN: 32 mL/min — AB (ref >60)
GLUCOSE: 114 mg/dL — AB (ref 65–99)
Potassium: 4 mmol/L (ref 3.5–5.1)
Sodium: 137 mmol/L (ref 135–145)

## 2016-05-23 LAB — CBC
HCT: 25.8 % — ABNORMAL LOW (ref 36.0–46.0)
Hemoglobin: 7.9 g/dL — ABNORMAL LOW (ref 12.0–15.0)
MCH: 27.6 pg (ref 26.0–34.0)
MCHC: 30.6 g/dL (ref 30.0–36.0)
MCV: 90.2 fL (ref 78.0–100.0)
PLATELETS: 142 10*3/uL — AB (ref 150–400)
RBC: 2.86 MIL/uL — ABNORMAL LOW (ref 3.87–5.11)
RDW: 19 % — AB (ref 11.5–15.5)
WBC: 6.5 10*3/uL (ref 4.0–10.5)

## 2016-05-23 LAB — PROTIME-INR
INR: 3.32
Prothrombin Time: 34.5 seconds — ABNORMAL HIGH (ref 11.4–15.2)

## 2016-05-23 LAB — HEMOGLOBIN AND HEMATOCRIT, BLOOD
HCT: 28.5 % — ABNORMAL LOW (ref 36.0–46.0)
HEMOGLOBIN: 8.6 g/dL — AB (ref 12.0–15.0)

## 2016-05-23 MED ORDER — DEXTROSE-NACL 5-0.9 % IV SOLN
INTRAVENOUS | Status: DC
  Administered 2016-05-23 (×2): via INTRAVENOUS

## 2016-05-23 NOTE — Progress Notes (Signed)
Initial Nutrition Assessment  DOCUMENTATION CODES:  Obesity Class 1 (at baseline wt)    INTERVENTION:  D/c Boost Breeze po TID. Patient doesn't want to drink them.   Heart Healthy diet    Vitamin therapy  Nutrition Services to obtain food preferences for each meal   NUTRITION DIAGNOSIS:   Altered nutrition lab value related to altered GI function as evidenced by   Hemoglobin 7.9 and GI assessment (suspects upper GI bleed).   GOAL:   Patient will meet greater than or equal to 90% of their needs   MONITOR:   PO intake, Labs, Weight trends  REASON FOR ASSESSMENT:   Malnutrition Screening Tool    ASSESSMENT: Patient is an 83 yo with hx of anemia, CAD, HTN, HLD and venous insufficiency. She has bilateral lower extremity edema (3+) and her weight is 9% above usual range of 78-80 kg).   Her appetite is good today and she is complaining of feeling hungry this morning. According to pt she is not a big breakfast eater. She usually has either grits or oatmeal with milk and orange juice. Lunch is sometimes a sandwich and a hot meal in the evening.  Her husband died three years ago and she doesn't cook like she used to. Sometimes she still cooks but mostly uses convienence foods such as pot pies and TV dinners.  She is not a fan of oral supplements and prefers to obtain her nutrition through foods. Nutrition services will be contacting her for meal and/ or snack preferences. Will d/c the Boost Breeze.    Recent Labs Lab 05/22/16 1045 05/23/16 0607  NA 136 137  K 4.3 4.0  CL 103 104  CO2 25 25  BUN 49* 40*  CREATININE 1.83* 1.49*  CALCIUM 8.9 8.6*  GLUCOSE 123* 114*   Labs: BUN and Cr- increased Meds: lasix (80 mg), potassium, MVI, vitamin C, iron   Diet Order:  Diet Heart Room service appropriate? Yes; Fluid consistency: Thin  Skin:  Reviewed, no issues  Last BM:  2/6 heme positive stool  Height:   Ht Readings from Last 1 Encounters:  05/22/16 5\' 1"  (1.549 m)     Weight:   Wt Readings from Last 1 Encounters:  05/22/16 190 lb (86.2 kg)    Ideal Body Weight:  48 kg  BMI:  Body mass index is 35.9 kg/m. (Skewed due to current lower extremity edema)  Estimated Nutritional Needs:   Kcal:  1200-1320  Protein:  88-95 gr  Fluid:  >1.2 liters daily  EDUCATION NEEDS:   No education needs identified at this time  Colman Cater MS,RD,CSG,LDN Office: [phone] Pager: [phone]

## 2016-05-23 NOTE — Progress Notes (Signed)
Initial Nutrition Assessment  DOCUMENTATION CODES:  Obesity Class 1 (at baseline wt)    INTERVENTION:  D/c Boost Breeze po TID. Patient doesn't want to drink them.   Heart Healthy diet   Nutrition Services to obtain food preferences for each meal   NUTRITION DIAGNOSIS:   Altered nutrition lab value related to altered GI function as evidenced by   Hemoglobin 7.9 and GI assessment (suspects upper GI bleed).   GOAL:   Patient will meet greater than or equal to 90% of their needs   MONITOR:   PO intake, Labs, Weight trends  REASON FOR ASSESSMENT:   Malnutrition Screening Tool    ASSESSMENT: Patient is an 83 yo with hx of anemia, CAD, HTN, HLD and venous insufficiency. She has bilateral lower extremity edema (3+) and her weight is 9% above usual range of 78-80 kg).   Her appetite is good today and she is complaining of feeling hungry this morning. According to pt she is not a big breakfast eater. She usually has either grits or oatmeal with milk and orange juice. Lunch is sometimes a sandwich and a hot meal in the evening.  Her husband died three years ago and she doesn't cook like she used to. Sometimes she still cooks but mostly uses convienence foods such as pot pies and TV dinners.  She is not a fan of oral supplements and prefers to obtain her nutrition through foods. Nutrition services will be contacting her for meal and/ or snack preferences. Will d/c the Boost Breeze.    Recent Labs Lab 05/22/16 1045 05/23/16 0607  NA 136 137  K 4.3 4.0  CL 103 104  CO2 25 25  BUN 49* 40*  CREATININE 1.83* 1.49*  CALCIUM 8.9 8.6*  GLUCOSE 123* 114*   Labs: BUN and Cr- increased  Diet Order:  Diet Heart Room service appropriate? Yes; Fluid consistency: Thin  Skin:  Reviewed, no issues  Last BM:  2/6 heme positive stool  Height:   Ht Readings from Last 1 Encounters:  05/22/16 5\' 1"  (1.549 m)    Weight:   Wt Readings from Last 1 Encounters:  05/22/16 190 lb (86.2  kg)    Ideal Body Weight:  48 kg  BMI:  Body mass index is 35.9 kg/m. (Skewed due to current lower extremity edema)  Estimated Nutritional Needs:   Kcal:  1200-1320  Protein:  88-95 gr  Fluid:  >1.2 liters daily  EDUCATION NEEDS:   No education needs identified at this time  Colman Cater MS,RD,CSG,LDN Office: [phone] Pager: [phone]

## 2016-05-23 NOTE — Evaluation (Signed)
Physical Therapy Evaluation Patient Details Name: [name] MRN: [id number] DOB: 30-Apr-1932 Today's Date: 05/23/2016   History of Present Illness  [name] is an 83yo white person identifying as female who comes to [facility] on 2/6 after 1 month of dizziness, worse with orthostasis. PMH: afib, CHF, HTN, HLD, CAD. Pt has a history of chronic melenic stool.  Prior to PT eval, most recent labs revealing HCT: 25.8, Hb: 7.9, and INR: 3.22. Pt has had 1 unit PRBC since admission. At baseline she is fully independent, drives, accesses the community without assistive device, manages her own medications and bills, and reports no falls history.   Clinical Impression  Pt admitted with above diagnosis. Pt currently with functional limitations due to the deficits listed below (see "PT Problem List"). Upon entry, the patient is received semirecumbent in bed, daughter/granddaughter present. The pt is awake and agreeable to participate. No acute distress noted at this time. Orthostatic vitals are established, the patient generally hypotensive supine/seated (104/70mmhg), but with good response and mild hypertension in stance and after activity (137/15mmHg). The patient denies any dizziness, lightheadedness, or visual disturbance throughout the visit. Pt performs all mobility independently during session, performing near baseline. No additional PT services needed at this time. PT signing off.      Follow Up Recommendations No PT follow up    Equipment Recommendations  None recommended by PT    Recommendations for Other Services       Precautions / Restrictions Precautions Precautions: None      Mobility  Bed Mobility Overal bed mobility: Independent                Transfers Overall transfer level: Needs assistance Equipment used: None                Ambulation/Gait Ambulation/Gait assistance: Supervision Ambulation Distance (Feet): 375 Feet Assistive device: None Gait  Pattern/deviations: WFL(Within Functional Limits) Gait velocity: 1.50m/s Gait velocity interpretation: >2.62 ft/sec, indicative of independent Tourist information centre manager [place] (Stroke Patients Only)       Balance Overall balance assessment: Independent                                           Pertinent Vitals/Pain Pain Assessment: No/denies pain    Home Living Family/patient expects to be discharged to:: Private residence Living Arrangements: Alone Available Help at Discharge: Family Type of Home: House Home Access: Level entry     Home Layout: One level Home Equipment: None      Prior Function Level of Independence: Independent         Comments: Fully independent      Hand Dominance        Extremity/Trunk Assessment   Upper Extremity Assessment Upper Extremity Assessment: Overall WFL for tasks assessed    Lower Extremity Assessment Lower Extremity Assessment: Overall WFL for tasks assessed       Communication   Communication: No difficulties  Cognition Arousal/Alertness: Awake/alert Behavior During Therapy: WFL for tasks assessed/performed Overall Cognitive Status: Within Functional Limits for tasks assessed                      General Comments      Exercises     Assessment/Plan    PT Assessment  Patent does not need any further PT services  PT Problem List            PT Treatment Interventions      PT Goals (Current goals can be found in the Care Plan section)  Acute Rehab PT Goals PT Goal Formulation: All assessment and education complete, DC therapy    Frequency     Barriers to discharge        Co-evaluation               End of Session Equipment Utilized During Treatment: Gait belt Activity Tolerance: Patient tolerated treatment well;No increased pain Patient left: in bed;with call bell/phone within reach;with family/visitor  present Nurse Communication: Mobility status;Other (comment) (BP cuff position)         Time:  -      Charges:         PT G Codes:        5:06 PM, June 03, 2016 Etta Grandchild, PT, DPT Physical Therapist at [facility] [phone] (office)

## 2016-05-23 NOTE — Progress Notes (Signed)
PROGRESS NOTE    [name]  [id number] DOB: 10-May-1932 DOA: 05/22/2016 PCP: Purvis Kilts, MD    Brief Narrative:  [name] is a 83 y.o. female with medical history of atrial fibrillation, diastolic CHF, angiodysplasia of the colon, hypertension, hyperlipidemia, coronary artery disease presented with 1 month history of dizziness that has worsened in the past week. The patient was at the lab getting her blood drawn when it was noticed that the patient was pale, and the patient was sent to the emergency department for further evaluation. The patient denies any fevers, chills, chest pain, shortness breath, syncope. The patient has had a history of iron deficiency anemia, and last week her GI physician told her to increase her iron to TID and increase Flinstones with iron to 2 tabs bid. The patient has not started this yet. She denies any nausea, vomiting, diarrhea, abdominal pain. The patient has chronic melanotic stools, but she takes iron. She denies any hematochezia, hematemesis, hemoptysis, hematuria, epistaxis.  In the emergency department, the patient was afebrile and hemodynamically stable with saturation of 96-100 percent on room air. Serum creatinine was 1.89 with a baseline of 0.9-1.3. CBC was unremarkable except for hemoglobin of 7.0. One unit of PRBC was ordered. Protonix was given in the emergency department.    Assessment & Plan:   Active Problems:   Chronic atrial fibrillation (HCC)   Coronary atherosclerosis of native coronary artery   Chronic venous insufficiency   Chronic anticoagulation   Hyperlipidemia   Acute blood loss anemia   Chronic blood loss anemia   Chronic diastolic CHF (congestive heart failure) (HCC)   Acute renal failure superimposed on stage 3 chronic kidney disease (HCC)   Symptomatically anemia/chronic blood loss anemia -rectal exam in ED by EDP did not have any bright red blood - GI consulted -FOBT positive confounded by pt  on iron -Transfused one additional unit to that ordered by ED (2 units total) - Iron studies showing adequate iron -08/19/2015 colonoscopy showed 3 angioectasias in the ascending colon -08/19/2015 EGD nonbleeding gastric ulcer - EGD tomorrow per GI  Coagulopathy -INR 4.05 -today is 3.32  Chronic atrial fibrillation -Hold Coumadin -check digoxin level -rate controlled  Chronic diastolic CHF -Lasix dose was recently increased on 05/07/2016 by cardiology -In the setting of acute on chronic renal failure, hold Lasix today and resume home morning dose in the morning 05/23/2016 -Normally on Lasix 80 mg a.m., 40 mg p.m. -Daily weights -07/07/2015 echo EF 60-65%, PASP 42 - repeat BMP in am to follow Cr  Acute on chronic renal failure--CKD 3 -Baseline creatinine 0.9-1.3 -Presenting creatinine 1.89 -Discontinue losartan - creatinine slightly improved this am to 1.49 - will repeat BMP in am as have restarted lasix  Hypertension -Hold dose bisoprolol secondary to soft blood pressure -Discontinue losartan and Hytrin secondary to soft blood pressure  Coronary artery disease -No anginal symptoms -EKG with nonspecific ST-T wave changes - holding BB 2/2 hypotensino  Hyperlipidemia -Continue statin   DVT prophylaxis: SCDs, previously on Coumadin prior to admission (INR of 3.32) Code Status: Full Code Family Communication: Daughter is bedside Disposition Plan: pending further GI workup   Consultants:   Gastroenterology  Procedures:   None  Antimicrobials:   None    Subjective: Patient seen and examined.  Voices she feels fine and is hopeful to talk to Dr. Laural Golden about what plan is for her GI workup.  Denies any new symptoms.  Objective: Vitals:   05/23/16 0025 05/23/16 0400 05/23/16  [id number] 05/23/16 1511  BP: (!) 90/32 (!) 105/33 (!) 110/50 (!) 106/39  Pulse: 67  64 73  Resp: 16  16 18   Temp: 98.3 F (36.8 C)  98 F (36.7 C) 98 F (36.7 C)  TempSrc: Oral   Oral Oral  SpO2: 95%  94% 97%  Weight:      Height:        Intake/Output Summary (Last 24 hours) at 05/23/16 1747 Last data filed at 05/23/16 1500  Gross per 24 hour  Intake             1136 ml  Output              600 ml  Net              536 ml   Filed Weights   05/22/16 1006  Weight: 86.2 kg (190 lb)    Examination:  General exam: Appears calm and comfortable  Respiratory system: Clear to auscultation. Respiratory effort normal. Cardiovascular system: S1 & S2 heard, RRR. No JVD, murmurs, rubs, gallops or clicks. No pedal edema. Gastrointestinal system: Abdomen is nondistended, soft and nontender. No organomegaly or masses felt. Normal bowel sounds heard. Central nervous system: Alert. No focal neurological deficits. Extremities: Symmetric 4 x 5 power. Skin: No rashes, lesions or ulcers Psychiatry: insight appears normal. affect appropriate.     Data Reviewed: I have personally reviewed following labs and imaging studies  CBC:  Recent Labs Lab 05/22/16 1045 05/23/16 0607 05/23/16 1558  WBC 7.1 6.5  --   NEUTROABS 5.5  --   --   HGB 7.0* 7.9* 8.6*  HCT 23.7* 25.8* 28.5*  MCV 93.7 90.2  --   PLT 158 142*  --    Basic Metabolic Panel:  Recent Labs Lab 05/22/16 1045 05/23/16 0607  NA 136 137  K 4.3 4.0  CL 103 104  CO2 25 25  GLUCOSE 123* 114*  BUN 49* 40*  CREATININE 1.83* 1.49*  CALCIUM 8.9 8.6*   GFR: Estimated Creatinine Clearance: 29.5 mL/min (by C-G formula based on SCr of 1.49 mg/dL (H)). Liver Function Tests: No results for input(s): AST, ALT, ALKPHOS, BILITOT, PROT, ALBUMIN in the last 168 hours. No results for input(s): LIPASE, AMYLASE in the last 168 hours. No results for input(s): AMMONIA in the last 168 hours. Coagulation Profile:  Recent Labs Lab 05/21/16 1053 05/22/16 1045 05/23/16 0607  INR 4.1 4.05* 3.32   Cardiac Enzymes:  Recent Labs Lab 05/22/16 1729  CKTOTAL 32*   BNP (last 3 results) No results for input(s):  PROBNP in the last 8760 hours. HbA1C: No results for input(s): HGBA1C in the last 72 hours. CBG: No results for input(s): GLUCAP in the last 168 hours. Lipid Profile: No results for input(s): CHOL, HDL, LDLCALC, TRIG, CHOLHDL, LDLDIRECT in the last 72 hours. Thyroid Function Tests: No results for input(s): TSH, T4TOTAL, FREET4, T3FREE, THYROIDAB in the last 72 hours. Anemia Panel:  Recent Labs  05/22/16 1738  FERRITIN 24  TIBC 469*  IRON 50   Sepsis Labs: No results for input(s): PROCALCITON, LATICACIDVEN in the last 168 hours.  No results found for this or any previous visit (from the past 240 hour(s)).       Radiology Studies: No results found.      Scheduled Meds: . sodium chloride   Intravenous Once  . sodium chloride   Intravenous Once  . atorvastatin  80 mg Oral QHS  . digoxin  0.125 mg Oral Daily  .  multivitamin with minerals  1 tablet Oral Daily  . potassium chloride SA  20 mEq Oral Daily  . vitamin C  500 mg Oral BID   Continuous Infusions: . dextrose 5 % and 0.9% NaCl 50 mL/hr at 05/23/16 0100     LOS: 0 days    Time spent: 30 minutes    Loretha Stapler, MD [facility] Pager [phone]  If 7PM-7AM, please contact night-coverage [email or web address] Password [facility] 05/23/2016, 5:47 PM

## 2016-05-23 NOTE — Consult Note (Signed)
Reason for Consult:GI bleed Referring Physician:   [name] is an 83 y.o. female.  HPI: Admitted thru the ED yesterday. Hx of anemia. States in the ED she had a positive stool card. She was last seen in November by Dr. Laural Golden. Hx of atrial fib and maintained on Coumadin. She held her Coumadin yesterday. She was evaluated by Dr. Laural Golden in May of 2017 for IDA. EGD revealed small antral/gastric ulcer.  H. Pylori was positive.  She was treated with Pylera. She states her stools are black. She takes iron.  She takes iron twice a day and was recently increased to tid.  In the ED her stool was rusty colored and strongly heme positive. She has received 2 units of PRBCs.  Hemoglobin this am 7.8    CBC    Component Value Date/Time   WBC 6.5 05/23/2016 0607   RBC 2.86 (L) 05/23/2016 0607   HGB 7.9 (L) 05/23/2016 0607   HCT 25.8 (L) 05/23/2016 0607   PLT 142 (L) 05/23/2016 0607   MCV 90.2 05/23/2016 0607   MCH 27.6 05/23/2016 0607   MCHC 30.6 05/23/2016 0607   RDW 19.0 (H) 05/23/2016 0607   LYMPHSABS 0.6 (L) 05/22/2016 1045   MONOABS 1.0 05/22/2016 1045   EOSABS 0.0 05/22/2016 1045   BASOSABS 0.0 05/22/2016 1045     Past Medical History:  Diagnosis Date  . Atrial fibrillation ([place])   . Carotid artery disease ([place])    RIght CEA 05/2004  . Chronic anticoagulation   . Coronary atherosclerosis of native coronary artery    RCA stent in 2003; residual 60% mid Cx and OM2  . Essential hypertension, benign   . Hyperlipidemia   . Obesity   . Renal artery stenosis (HCC)    Bilateral renal artery stents 2004  . Tobacco abuse, in remission    40 pack year stopped in 1989  . Venous insufficiency (chronic) (peripheral)    Pedal edema    Past Surgical History:  Procedure Laterality Date  . APPENDECTOMY    . CAROTID ENDARTERECTOMY     Right in 2005, left in 2010  . COLONOSCOPY  Never   Declines  . COLONOSCOPY N/A 08/19/2015   Procedure: COLONOSCOPY;  Surgeon: Rogene Houston, MD;   Location: [facility];  Service: Endoscopy;  Laterality: N/A;  7:30  . CORONARY ANGIOPLASTY    . ESOPHAGOGASTRODUODENOSCOPY N/A 08/19/2015   Procedure: ESOPHAGOGASTRODUODENOSCOPY (EGD);  Surgeon: Rogene Houston, MD;  Location: [facility];  Service: Endoscopy;  Laterality: N/A;    Family History  Problem Relation Age of Onset  . Lung cancer Mother   . Cancer Mother   . Hypertension Daughter   . Hyperlipidemia Daughter   . Hypertension Son   . Hypertension Daughter   . Heart disease Maternal Grandmother     Social History:  reports that she quit smoking about 29 years ago. Her smoking use included Cigarettes. She has never used smokeless tobacco. She reports that she does not drink alcohol or use drugs.  Allergies:  Allergies  Allergen Reactions  . Diltiazem Other (See Comments)    Edema  . Verapamil Other (See Comments)    Diarrhea    Medications: I have reviewed the patient's current medications.  Results for orders placed or performed during the hospital encounter of 05/22/16 (from the past 48 hour(s))  CBC with Differential     Status: Abnormal   Collection Time: 05/22/16 10:45 AM  Result Value Ref Range  WBC 7.1 4.0 - 10.5 K/uL   RBC 2.53 (L) 3.87 - 5.11 MIL/uL   Hemoglobin 7.0 (L) 12.0 - 15.0 g/dL   HCT 23.7 (L) 36.0 - 46.0 %   MCV 93.7 78.0 - 100.0 fL   MCH 27.7 26.0 - 34.0 pg   MCHC 29.5 (L) 30.0 - 36.0 g/dL   RDW 19.2 (H) 11.5 - 15.5 %   Platelets 158 150 - 400 K/uL   Neutrophils Relative % 77 %   Neutro Abs 5.5 1.7 - 7.7 K/uL   Lymphocytes Relative 8 %   Lymphs Abs 0.6 (L) 0.7 - 4.0 K/uL   Monocytes Relative 14 %   Monocytes Absolute 1.0 0.1 - 1.0 K/uL   Eosinophils Relative 0 %   Eosinophils Absolute 0.0 0.0 - 0.7 K/uL   Basophils Relative 0 %   Basophils Absolute 0.0 0.0 - 0.1 K/uL  Basic metabolic panel     Status: Abnormal   Collection Time: 05/22/16 10:45 AM  Result Value Ref Range   Sodium 136 135 - 145 mmol/L   Potassium 4.3 3.5 - 5.1  mmol/L   Chloride 103 101 - 111 mmol/L   CO2 25 22 - 32 mmol/L   Glucose, Bld 123 (H) 65 - 99 mg/dL   BUN 49 (H) 6 - 20 mg/dL   Creatinine, Ser 1.83 (H) 0.44 - 1.00 mg/dL   Calcium 8.9 8.9 - 10.3 mg/dL   GFR calc non Af Amer 25 (L) >60 mL/min   GFR calc Af Amer 29 (L) >60 mL/min    Comment: (NOTE) The eGFR has been calculated using the CKD EPI equation. This calculation has not been validated in all clinical situations. eGFR's persistently <60 mL/min signify possible Chronic Kidney Disease.    Anion gap 8 5 - 15  Protime-INR     Status: Abnormal   Collection Time: 05/22/16 10:45 AM  Result Value Ref Range   Prothrombin Time 40.4 (H) 11.4 - 15.2 seconds   INR 4.05 (HH)     Comment: CRITICAL RESULT CALLED TO, READ BACK BY AND VERIFIED WITH: [facility] RN AT 1130 BY HFLYNT 05/22/16 REPEATED TO VERIFY   Type and screen [facility]     Status: None   Collection Time: 05/22/16 10:47 AM  Result Value Ref Range   ISSUE DATE / TIME [id number]    Blood Product Unit Number [id number]    PRODUCT CODE [id number]    Unit Type and Rh 5100    Blood Product Expiration Date [id number]    ISSUE DATE / TIME [id number]    Blood Product Unit Number [id number]    Unit Type and Rh 5100    Blood Product Expiration Date [id number]   POC occult blood, ED     Status: Abnormal   Collection Time: 05/22/16 12:06 PM  Result Value Ref Range   Fecal Occult Bld POSITIVE (A) NEGATIVE  Prepare RBC     Status: None   Collection Time: 05/22/16  1:41 PM  Result Value Ref Range   Order Confirmation ORDER PROCESSED BY BLOOD BANK   Digoxin level     Status: None   Collection Time: 05/22/16  5:29 PM  Result Value Ref Range   Digoxin Level 0.9 0.8 - 2.0 ng/mL  CK     Status: Abnormal   Collection Time: 05/22/16  5:29 PM  Result Value Ref Range   Total CK 32 (L) 38 - 234 U/L  Iron and TIBC     Status: Abnormal  Collection Time: 05/22/16  5:38 PM  Result Value Ref Range   Iron 50 28 - 170  ug/dL   TIBC 469 (H) 250 - 450 ug/dL   Saturation Ratios 11 10.4 - 31.8 %   UIBC 419 ug/dL    Comment: Performed at [facility], [place] [address], [place], [place] [address]  Ferritin     Status: None   Collection Time: 05/22/16  5:38 PM  Result Value Ref Range   Ferritin 24 11 - 307 ng/mL    Comment: Performed at [facility], [place] [address], [place], [place] [address]  Prepare RBC     Status: None   Collection Time: 05/22/16  5:40 PM  Result Value Ref Range   Order Confirmation ORDER PROCESSED BY BLOOD BANK   Protime-INR     Status: Abnormal   Collection Time: 05/23/16  6:07 AM  Result Value Ref Range   Prothrombin Time 34.5 (H) 11.4 - 15.2 seconds   INR 3.32   CBC     Status: Abnormal   Collection Time: 05/23/16  6:07 AM  Result Value Ref Range   WBC 6.5 4.0 - 10.5 K/uL   RBC 2.86 (L) 3.87 - 5.11 MIL/uL   Hemoglobin 7.9 (L) 12.0 - 15.0 g/dL   HCT 25.8 (L) 36.0 - 46.0 %   MCV 90.2 78.0 - 100.0 fL   MCH 27.6 26.0 - 34.0 pg   MCHC 30.6 30.0 - 36.0 g/dL   RDW 19.0 (H) 11.5 - 15.5 %   Platelets 142 (L) 150 - 400 K/uL  Basic metabolic panel     Status: Abnormal   Collection Time: 05/23/16  6:07 AM  Result Value Ref Range   Sodium 137 135 - 145 mmol/L   Potassium 4.0 3.5 - 5.1 mmol/L   Chloride 104 101 - 111 mmol/L   CO2 25 22 - 32 mmol/L   Glucose, Bld 114 (H) 65 - 99 mg/dL   BUN 40 (H) 6 - 20 mg/dL   Creatinine, Ser 1.49 (H) 0.44 - 1.00 mg/dL   Calcium 8.6 (L) 8.9 - 10.3 mg/dL   GFR calc non Af Amer 32 (L) >60 mL/min   GFR calc Af Amer 37 (L) >60 mL/min    Comment: (NOTE) The eGFR has been calculated using the CKD EPI equation. This calculation has not been validated in all clinical situations. eGFR's persistently <60 mL/min signify possible Chronic Kidney Disease.    Anion gap 8 5 - 15    No results found.  ROS Blood pressure (!) 110/50, pulse 64, temperature 98 F (36.7 C), temperature source Oral, resp. rate 16, height '5\' 1"'$  (1.549 m), weight 190  lb (86.2 kg), SpO2 94 %. Physical Exam  Alert and oriented. Skin warm and dry. Oral mucosa is moist.   . Sclera anicteric, conjunctivae is pink. Thyroid not enlarged. No cervical lymphadenopathy. Lungs clear. Heart irregular. .  Abdomen is soft. Bowel sounds are positive. No hepatomegaly. No abdominal masses felt. No tenderness.  4+ edema to lower extremities.   Assessment/Plan: Melena.   Probably UGI bleed.Last EGD in May of 2018. Will discuss with Dr. Laural Golden.   Zymier Rodgers W 05/23/2016, 7:53 AM

## 2016-05-23 NOTE — Progress Notes (Signed)
BP 90/32, Dr Truman Hayward paged and made aware D5NS @50ml /hr

## 2016-05-24 ENCOUNTER — Encounter (HOSPITAL_COMMUNITY): Payer: Self-pay | Admitting: *Deleted

## 2016-05-24 ENCOUNTER — Encounter (HOSPITAL_COMMUNITY)
Admission: EM | Disposition: A | Discharge status: Home / Self Care | Payer: Self-pay | Source: Home / Self Care | Attending: Family Medicine

## 2016-05-24 ENCOUNTER — Telehealth: Payer: Self-pay | Admitting: *Deleted

## 2016-05-24 DIAGNOSIS — N179 Acute kidney failure, unspecified: Secondary | ICD-10-CM

## 2016-05-24 DIAGNOSIS — Z8711 Personal history of peptic ulcer disease: Secondary | ICD-10-CM

## 2016-05-24 DIAGNOSIS — D500 Iron deficiency anemia secondary to blood loss (chronic): Secondary | ICD-10-CM

## 2016-05-24 DIAGNOSIS — D62 Acute posthemorrhagic anemia: Secondary | ICD-10-CM

## 2016-05-24 DIAGNOSIS — N183 Chronic kidney disease, stage 3 (moderate): Secondary | ICD-10-CM

## 2016-05-24 DIAGNOSIS — K922 Gastrointestinal hemorrhage, unspecified: Secondary | ICD-10-CM

## 2016-05-24 DIAGNOSIS — K31811 Angiodysplasia of stomach and duodenum with bleeding: Secondary | ICD-10-CM

## 2016-05-24 DIAGNOSIS — I2510 Atherosclerotic heart disease of native coronary artery without angina pectoris: Secondary | ICD-10-CM

## 2016-05-24 HISTORY — PX: ESOPHAGOGASTRODUODENOSCOPY: SHX5428

## 2016-05-24 LAB — CBC
HCT: 26.5 % — ABNORMAL LOW (ref 36.0–46.0)
Hemoglobin: 8.1 g/dL — ABNORMAL LOW (ref 12.0–15.0)
MCH: 28.1 pg (ref 26.0–34.0)
MCHC: 30.6 g/dL (ref 30.0–36.0)
MCV: 92 fL (ref 78.0–100.0)
PLATELETS: 143 10*3/uL — AB (ref 150–400)
RBC: 2.88 MIL/uL — ABNORMAL LOW (ref 3.87–5.11)
RDW: 19.1 % — AB (ref 11.5–15.5)
WBC: 6.7 10*3/uL (ref 4.0–10.5)

## 2016-05-24 LAB — PROTIME-INR
INR: 2.63
INR: 2.22
PROTHROMBIN TIME: 28.6 s — AB (ref 11.4–15.2)
Prothrombin Time: 25 seconds — ABNORMAL HIGH (ref 11.4–15.2)

## 2016-05-24 LAB — HEMOGLOBIN AND HEMATOCRIT, BLOOD
HCT: 31.2 % — ABNORMAL LOW (ref 36.0–46.0)
HEMOGLOBIN: 9.4 g/dL — AB (ref 12.0–15.0)

## 2016-05-24 LAB — PREPARE RBC (CROSSMATCH)

## 2016-05-24 SURGERY — EGD (ESOPHAGOGASTRODUODENOSCOPY)
Anesthesia: Moderate Sedation

## 2016-05-24 MED ORDER — SODIUM CHLORIDE 0.9 % IV SOLN
Freq: Once | INTRAVENOUS | Status: AC
  Administered 2016-05-24: 10:00:00 via INTRAVENOUS

## 2016-05-24 MED ORDER — PANTOPRAZOLE SODIUM 40 MG PO TBEC
40.0000 mg | DELAYED_RELEASE_TABLET | Freq: Two times a day (BID) | ORAL | Status: DC
  Administered 2016-05-25: 40 mg via ORAL
  Filled 2016-05-24: qty 1

## 2016-05-24 MED ORDER — DIPHENHYDRAMINE HCL 25 MG PO CAPS
25.0000 mg | ORAL_CAPSULE | Freq: Once | ORAL | Status: AC
  Administered 2016-05-24: 25 mg via ORAL
  Filled 2016-05-24: qty 1

## 2016-05-24 MED ORDER — VITAMIN K1 10 MG/ML IJ SOLN
2.0000 mg | Freq: Once | INTRAMUSCULAR | Status: AC
  Administered 2016-05-24: 2 mg via SUBCUTANEOUS
  Filled 2016-05-24: qty 1

## 2016-05-24 MED ORDER — PANTOPRAZOLE SODIUM 40 MG IV SOLR
40.0000 mg | Freq: Two times a day (BID) | INTRAVENOUS | Status: AC
  Administered 2016-05-24: 40 mg via INTRAVENOUS
  Filled 2016-05-24: qty 40

## 2016-05-24 MED ORDER — ACETAMINOPHEN 325 MG PO TABS
650.0000 mg | ORAL_TABLET | Freq: Once | ORAL | Status: AC
  Administered 2016-05-24: 650 mg via ORAL
  Filled 2016-05-24: qty 2

## 2016-05-24 MED ORDER — VITAMIN K1 10 MG/ML IJ SOLN
2.0000 mg | Freq: Once | INTRAMUSCULAR | Status: AC
  Administered 2016-05-24: 2 mg via SUBCUTANEOUS
  Filled 2016-05-24: qty 0.2

## 2016-05-24 MED ORDER — MEPERIDINE HCL 50 MG/ML IJ SOLN
INTRAMUSCULAR | Status: DC | PRN
  Administered 2016-05-24: 25 mg

## 2016-05-24 MED ORDER — MIDAZOLAM HCL 5 MG/5ML IJ SOLN
INTRAMUSCULAR | Status: AC
  Filled 2016-05-24: qty 10

## 2016-05-24 MED ORDER — FUROSEMIDE 20 MG PO TABS
20.0000 mg | ORAL_TABLET | Freq: Once | ORAL | Status: AC
  Administered 2016-05-24: 20 mg via ORAL
  Filled 2016-05-24: qty 1

## 2016-05-24 MED ORDER — MEPERIDINE HCL 50 MG/ML IJ SOLN
INTRAMUSCULAR | Status: AC
  Filled 2016-05-24: qty 1

## 2016-05-24 MED ORDER — SODIUM CHLORIDE 0.9 % IV SOLN
INTRAVENOUS | Status: DC
  Administered 2016-05-24: 20:00:00 via INTRAVENOUS

## 2016-05-24 MED ORDER — BUTAMBEN-TETRACAINE-BENZOCAINE 2-2-14 % EX AERO
INHALATION_SPRAY | CUTANEOUS | Status: AC
  Filled 2016-05-24: qty 20

## 2016-05-24 MED ORDER — BUTAMBEN-TETRACAINE-BENZOCAINE 2-2-14 % EX AERO
INHALATION_SPRAY | CUTANEOUS | Status: DC | PRN
  Administered 2016-05-24: 2 via TOPICAL

## 2016-05-24 MED ORDER — MIDAZOLAM HCL 5 MG/5ML IJ SOLN
INTRAMUSCULAR | Status: DC | PRN
  Administered 2016-05-24 (×3): 1 mg via INTRAVENOUS

## 2016-05-24 NOTE — Op Note (Signed)
[facility] Patient Name: [name] Procedure Date: 05/24/2016 3:30 PM MRN: [id number] Date of Birth: [birth year] Attending MD: Hildred Laser , MD CSN: [id number] Age: 83 Admit Type: Inpatient Procedure:                Upper GI endoscopy Indications:              Heme positive stool, Melena Providers:                Hildred Laser, MD, Janeece Riggers, RN, Purcell Nails. [place],                            [employer]chant [employer] officer Referring MD:              Medicines:                Cetacaine spray, Meperidine 50 mg IV, Midazolam 3                            mg IV Complications:            No immediate complications. Estimated Blood Loss:     Estimated blood loss: none. Procedure:                Pre-Anesthesia Assessment:                           - Prior to the procedure, a History and Physical                            was performed, and patient medications and                            allergies were reviewed. The patient's tolerance of                            previous anesthesia was also reviewed. The risks                            and benefits of the procedure and the sedation                            options and risks were discussed with the patient.                            All questions were answered, and informed consent                            was obtained. Prior Anticoagulants: The patient                            last took Coumadin (warfarin) 2 days prior to the                            procedure. ASA Grade Assessment: III - A patient  with severe systemic disease. After reviewing the                            risks and benefits, the patient was deemed in                            satisfactory condition to undergo the procedure.                           After obtaining informed consent, the endoscope was                            passed under direct vision. Throughout the                            procedure, the patient's blood pressure,  pulse, and                            oxygen saturations were monitored continuously. The                            [id number]) scope was introduced through the                            mouth, and advanced to the second part of duodenum.                            The upper GI endoscopy was accomplished without                            difficulty. The patient tolerated the procedure                            well. Scope In: 3:37:34 PM Scope Out: 3:57:56 PM Total Procedure Duration: 0 hours 20 minutes 22 seconds  Findings:      The examined esophagus was normal.      The Z-line was irregular and was found 39 cm from the incisors.      Red blood was found in the gastric fundus and in the gastric body.      A single small bleeding angiodysplastic lesion was found in the gastric       fundus. Coagulation for hemostasis using argon plasma was unsuccessful.       To stop active bleeding, one hemostatic clip was successfully placed (MR       conditional). There was no bleeding at the end of the procedure. Oozing       noted from area at body from suction mark with scope. It stopped       spontaneously.      A healed ulcer was found in the prepyloric region of the stomach.      The exam of the stomach was otherwise normal.      The duodenal bulb and second portion of the duodenum were normal. Impression:               - Normal esophagus.                           -  Z-line irregular, 39 cm from the incisors.                           - Coffee ground red blood in the gastric fundus and                            in the gastric body.                           - A single bleeding angiodysplastic lesion in the                            stomach. Treated with argon plasma coagulation                            (APC). Clip (MR conditional) was placed.                           - Scar in the prepyloric region of the stomach.                           - Normal duodenal bulb and second  portion of the                            duodenum.                           - No specimens collected. Moderate Sedation:      Moderate (conscious) sedation was administered by the endoscopy nurse       and supervised by the endoscopist. The following parameters were       monitored: oxygen saturation, heart rate, blood pressure, CO2       capnography and response to care. Total physician intraservice time was       22 minutes. Recommendation:           - Return patient to hospital ward for ongoing care.                           - Low sodium diet today.                           - Continue present medications. Procedure Code(s):        --- Professional ---                           [phone], Esophagogastroduodenoscopy, flexible,                            transoral; with control of bleeding, any method                           99152, Moderate sedation services provided by the                            same physician or other qualified health care  professional performing the diagnostic or                            therapeutic service that the sedation supports,                            requiring the presence of an independent trained                            observer to assist in the monitoring of the                            patient's level of consciousness and physiological                            status; initial 15 minutes of intraservice time,                            patient age 12 years or older Diagnosis Code(s):        --- Professional ---                           K22.8, Other specified diseases of esophagus                           K92.2, Gastrointestinal hemorrhage, unspecified                           K31.811, Angiodysplasia of stomach and duodenum                            with bleeding                           K31.89, Other diseases of stomach and duodenum                           R19.5, Other fecal abnormalities                            K92.1, Melena (includes Hematochezia) CPT copyright 2016 [employer] Association. All rights reserved. The codes documented in this report are preliminary and upon coder review may  be revised to meet current compliance requirements. Hildred Laser, MD Hildred Laser, MD 05/24/2016 4:16:11 PM This report has been signed electronically. Number of Addenda: 0

## 2016-05-24 NOTE — Progress Notes (Signed)
Dr. Laural Golden may speak with daughter, Walden Field, concerning patient after procedure per patient.

## 2016-05-24 NOTE — Progress Notes (Signed)
Pt's mother was concerned about some redness around pt's eyes. She was thinking it was an allergic reaction to some meds that may have been given. Pt has no c/o of itching over body or tightness around the throat. Allergy med given as a prophylactic. Will continue to monitor.

## 2016-05-24 NOTE — Progress Notes (Signed)
Patient is sleeping. Hemoglobin is 8.1 INR 2.63.  Patient will be transfuse another unit of PRBCs. Vitamin K 2 mg subcutaneous 1. EGD later today.

## 2016-05-24 NOTE — Telephone Encounter (Signed)
Pt just had surgery, she won't be able to make it to apt on Monday, she's scheduled to see SM on Friday 2/16, can see get INR checked on Friday? Please call pt's daughter

## 2016-05-24 NOTE — Telephone Encounter (Signed)
OK to check INR on 2/16 at [name] appt.  Spoke with son in [employer].  He will inform his wife.

## 2016-05-24 NOTE — Care Management Note (Signed)
Case Management Note  Patient Details  Name: [name] MRN: [id number] Date of Birth: 1933/03/14  Subjective/Objective:   Patient adm from home with anemia. She is from home alone, ind with ADL's, has family support if needed. Patient has PCP, transportation, and reports no difficulties obtaining medications.              Action/Plan: No PT follow up needed. Anticipate DC home with self care.    Expected Discharge Date:        05/24/2016         Expected Discharge Plan:  Home/Self Care  In-House Referral:  NA  Discharge planning Services  CM Consult  Post Acute Care Choice:  NA Choice offered to:  NA  DME Arranged:    DME Agency:     HH Arranged:    HH Agency:     Status of Service:  In process, will continue to follow  If discussed at Long Length of Stay Meetings, dates discussed:    Additional Comments:  Susette Seminara, Chauncey Reading, RN 05/24/2016, 1:51 PM

## 2016-05-24 NOTE — Progress Notes (Signed)
PROGRESS NOTE    [name]  [id number] DOB: Aug 21, 1932 DOA: 05/22/2016 PCP: Purvis Kilts, MD    Brief Narrative:  [name] is a 83 y.o. female with medical history of atrial fibrillation, diastolic CHF, angiodysplasia of the colon, hypertension, hyperlipidemia, coronary artery disease presented with 1 month history of dizziness that has worsened in the past week. The patient was at the lab getting her blood drawn when it was noticed that the patient was pale, and the patient was sent to the emergency department for further evaluation. The patient denies any fevers, chills, chest pain, shortness breath, syncope. The patient has had a history of iron deficiency anemia, and last week her GI physician told her to increase her iron to TID and increase Flinstones with iron to 2 tabs bid. The patient has not started this yet. She denies any nausea, vomiting, diarrhea, abdominal pain. The patient has chronic melanotic stools, but she takes iron. She denies any hematochezia, hematemesis, hemoptysis, hematuria, epistaxis.  In the emergency department, the patient was afebrile and hemodynamically stable with saturation of 96-100 percent on room air. Serum creatinine was 1.89 with a baseline of 0.9-1.3. CBC was unremarkable except for hemoglobin of 7.0. One unit of PRBC was ordered. Protonix was given in the emergency department.  GI consulted and patient underwent EGD on 05/24/16.      Assessment & Plan:   Active Problems:   Chronic atrial fibrillation (HCC)   Coronary atherosclerosis of native coronary artery   Chronic venous insufficiency   Chronic anticoagulation   Hyperlipidemia   Acute blood loss anemia   Chronic blood loss anemia   Chronic diastolic CHF (congestive heart failure) (HCC)   Acute renal failure superimposed on stage 3 chronic kidney disease (HCC)   Difficulty in walking, not elsewhere classified   Dizziness and giddiness   Gastrointestinal hemorrhage  associated with peptic ulcer   Renal impairment   Symptomatically anemia/chronic blood loss anemia -rectal exam in ED by EDP did not have any bright red blood - GI consulted -FOBT positive confounded by pt on iron -Transfused one additional unit today (3 units total) - Iron studies showing adequate iron -08/19/2015 colonoscopy showed 3 angioectasias in the ascending colon -08/19/2015 EGD nonbleeding gastric ulcer - EGD showing bleeding angiodysplastic lesion  Coagulopathy -INR 4.05 -today is 2.22 - Vitamin K given  Chronic atrial fibrillation -Hold Coumadin -check digoxin level -rate controlled  Chronic diastolic CHF -Lasix dose was recently increased on 05/07/2016 by cardiology -In the setting of acute on chronic renal failure, hold Lasix today and resume home morning dose in the morning 05/23/2016 -Normally on Lasix 80 mg a.m., 40 mg p.m. -Daily weights - holding lasix 2/2 hypotension -07/07/2015 echo EF 60-65%, PASP 42 - repeat BMP in am to follow Cr  Acute on chronic renal failure--CKD 3 -Baseline creatinine 0.9-1.3 -Presenting creatinine 1.89 -Discontinue losartan - creatinine slightly improved this am to 1.49 - will repeat BMP in am as have restarted lasix  Hypertension -Hold dose bisoprolol secondary to soft blood pressure -Discontinue losartan and Hytrin secondary to soft blood pressure  Coronary artery disease -No anginal symptoms -EKG with nonspecific ST-T wave changes - holding BB 2/2 hypotension  Hyperlipidemia -Continue statin   DVT prophylaxis: SCDs, previously on Coumadin prior to admission (INR of 2.22) Code Status: Full Code Family Communication: Daughter is bedside Disposition Plan: pending further GI workup; can consider d/c in the next 24-48 hours   Consultants:   Gastroenterology  Procedures:  None  Antimicrobials:   None    Subjective: Patient seen and examined.  Receiving 1 unit of PRBC prior to EGD today.  States  that she feels well.  Does voice her legs are slightly more swollen today.  Says legs are swollen like this at home as well.  Objective: Vitals:   05/24/16 1550 05/24/16 1555 05/24/16 1600 05/24/16 1605  BP: (!) 107/50 (!) 101/38 (!) 115/101 (!) 91/35  Pulse: 81 (!) 103 81 83  Resp: 19 20 20 19   Temp:      TempSrc:      SpO2: 96% 96% 97% 98%  Weight:      Height:        Intake/Output Summary (Last 24 hours) at 05/24/16 1706 Last data filed at 05/24/16 1600  Gross per 24 hour  Intake             3233 ml  Output                0 ml  Net             3233 ml   Filed Weights   05/22/16 1006 05/24/16 0426  Weight: 86.2 kg (190 lb) 83.3 kg (183 lb 10.3 oz)    Examination:  General exam: Appears calm and comfortable  Respiratory system: Clear to auscultation. Respiratory effort normal. Cardiovascular system: S1 & S2 heard, RRR. No JVD, murmurs, rubs, gallops or clicks. 2+ pedal edema. Gastrointestinal system: Abdomen is nondistended, soft and nontender. No organomegaly or masses felt. Normal bowel sounds heard. Central nervous system: Alert. No focal neurological deficits. Extremities: Symmetric 5x 5 power. Skin: No rashes, lesions or ulcers Psychiatry: insight appears normal. affect appropriate.     Data Reviewed: I have personally reviewed following labs and imaging studies  CBC:  Recent Labs Lab 05/22/16 1045 05/23/16 0607 05/23/16 1558 05/24/16 0528 05/24/16 1314  WBC 7.1 6.5  --  6.7  --   NEUTROABS 5.5  --   --   --   --   HGB 7.0* 7.9* 8.6* 8.1* 9.4*  HCT 23.7* 25.8* 28.5* 26.5* 31.2*  MCV 93.7 90.2  --  92.0  --   PLT 158 142*  --  143*  --    Basic Metabolic Panel:  Recent Labs Lab 05/22/16 1045 05/23/16 0607  NA 136 137  K 4.3 4.0  CL 103 104  CO2 25 25  GLUCOSE 123* 114*  BUN 49* 40*  CREATININE 1.83* 1.49*  CALCIUM 8.9 8.6*   GFR: Estimated Creatinine Clearance: 29 mL/min (by C-G formula based on SCr of 1.49 mg/dL (H)). Liver Function  Tests: No results for input(s): AST, ALT, ALKPHOS, BILITOT, PROT, ALBUMIN in the last 168 hours. No results for input(s): LIPASE, AMYLASE in the last 168 hours. No results for input(s): AMMONIA in the last 168 hours. Coagulation Profile:  Recent Labs Lab 05/21/16 1053 05/22/16 1045 05/23/16 0607 05/24/16 0528 05/24/16 1314  INR 4.1 4.05* 3.32 2.63 2.22   Cardiac Enzymes:  Recent Labs Lab 05/22/16 1729  CKTOTAL 32*   BNP (last 3 results) No results for input(s): PROBNP in the last 8760 hours. HbA1C: No results for input(s): HGBA1C in the last 72 hours. CBG: No results for input(s): GLUCAP in the last 168 hours. Lipid Profile: No results for input(s): CHOL, HDL, LDLCALC, TRIG, CHOLHDL, LDLDIRECT in the last 72 hours. Thyroid Function Tests: No results for input(s): TSH, T4TOTAL, FREET4, T3FREE, THYROIDAB in the last 72 hours. Anemia Panel:  Recent  Labs  05/22/16 1738  FERRITIN 24  TIBC 469*  IRON 50   Sepsis Labs: No results for input(s): PROCALCITON, LATICACIDVEN in the last 168 hours.  No results found for this or any previous visit (from the past 240 hour(s)).       Radiology Studies: No results found.      Scheduled Meds: . sodium chloride   Intravenous Once  . sodium chloride   Intravenous Once  . atorvastatin  80 mg Oral QHS  . butamben-tetracaine-benzocaine      . digoxin  0.125 mg Oral Daily  . meperidine      . midazolam      . multivitamin with minerals  1 tablet Oral Daily  . [START ON 05/25/2016] pantoprazole  40 mg Oral BID AC  . pantoprazole (PROTONIX) IV  40 mg Intravenous Q12H  . potassium chloride SA  20 mEq Oral Daily  . vitamin C  500 mg Oral BID   Continuous Infusions: . dextrose 5 % and 0.9% NaCl 50 mL/hr at 05/23/16 1934     LOS: 1 day    Time spent: 30 minutes    Loretha Stapler, MD [facility] Pager [phone]  If 7PM-7AM, please contact night-coverage [email or web address] Password [email or web address] 05/24/2016, 5:06 PM

## 2016-05-24 NOTE — Progress Notes (Signed)
Brief EGD note.  Previously identified gastric ulcer completely healed. Fresh blood and coffee-ground material noted in the stomach traced to and they'll AV malformation with active bleeding. Pain control with APC and application of single clip. Oozing noted from another area at gastric body which stopped spontaneously. Was felt to be due to suction with endoscope.

## 2016-05-25 LAB — CBC
HCT: 30.3 % — ABNORMAL LOW (ref 36.0–46.0)
HEMOGLOBIN: 9.3 g/dL — AB (ref 12.0–15.0)
MCH: 28.5 pg (ref 26.0–34.0)
MCHC: 30.7 g/dL (ref 30.0–36.0)
MCV: 92.9 fL (ref 78.0–100.0)
PLATELETS: 154 10*3/uL (ref 150–400)
RBC: 3.26 MIL/uL — ABNORMAL LOW (ref 3.87–5.11)
RDW: 18.7 % — AB (ref 11.5–15.5)
WBC: 7.5 10*3/uL (ref 4.0–10.5)

## 2016-05-25 LAB — TYPE AND SCREEN
ABO/RH(D): O POS
Antibody Screen: NEGATIVE
Unit division: 0
Unit division: 0
Unit division: 0

## 2016-05-25 LAB — BASIC METABOLIC PANEL
Anion gap: 6 (ref 5–15)
BUN: 24 mg/dL — AB (ref 6–20)
CALCIUM: 8.7 mg/dL — AB (ref 8.9–10.3)
CO2: 26 mmol/L (ref 22–32)
CREATININE: 1.29 mg/dL — AB (ref 0.44–1.00)
Chloride: 107 mmol/L (ref 101–111)
GFR, EST AFRICAN AMERICAN: 44 mL/min — AB (ref >60)
GFR, EST NON AFRICAN AMERICAN: 38 mL/min — AB (ref >60)
Glucose, Bld: 122 mg/dL — ABNORMAL HIGH (ref 65–99)
Potassium: 4.5 mmol/L (ref 3.5–5.1)
SODIUM: 139 mmol/L (ref 135–145)

## 2016-05-25 LAB — PROTIME-INR
INR: 2.07
PROTHROMBIN TIME: 23.6 s — AB (ref 11.4–15.2)

## 2016-05-25 MED ORDER — WARFARIN SODIUM 5 MG PO TABS: ORAL_TABLET | ORAL | 3 refills | Status: DC

## 2016-05-25 MED ORDER — PANTOPRAZOLE SODIUM 40 MG PO TBEC: 40.0000 mg | DELAYED_RELEASE_TABLET | Freq: Two times a day (BID) | ORAL | 0 refills | Status: DC

## 2016-05-25 MED ORDER — LOSARTAN POTASSIUM 100 MG PO TABS: 50.0000 mg | ORAL_TABLET | Freq: Every day | ORAL | 1 refills | Status: DC

## 2016-05-25 NOTE — Progress Notes (Signed)
Patient discharged home.  IV removed - WNL.  Reviewed DC instructions and medications.  Instructed to follow up with GI and cardio.  Patient verbalizes understanding.  No questions at this time.  Patient in NAD and stable for DC.

## 2016-05-25 NOTE — Discharge Summary (Signed)
Physician Discharge Summary  [name] [id number] DOB: August 05, 1932 DOA: 05/22/2016  PCP: Peggy Kilts, MD  Admit date: 05/22/2016 Discharge date: 05/25/2016  Admitted From: Home  Disposition:  Home   Recommendations for Outpatient Follow-up:  1. Follow up with PCP in 1-2 weeks 2. Restart coumadin on Tuesday 2/13 3. Dr. Olevia Perches office will call you to get blood work next week 4. Please obtain BMP/CBC in one week 5. Stop taking your iron supplement   Home Health:No  Equipment/Devices: None  Discharge Condition: Stable CODE STATUS: Full Code Diet recommendation: Heart Healthy  Brief/Interim Summary: Peggy S McKinneyis a 83 y.o.femalewith medical history of atrial fibrillation, diastolic CHF, angiodysplasia of the colon, hypertension, hyperlipidemia, coronary artery disease presented with 1 month history of dizziness that has worsened in the past week. The patient was at the lab getting her blood drawn when it was noticed that the patient was pale, and the patient was sent to the emergency department for further evaluation. The patient denies any fevers, chills, chest pain, shortness breath, syncope. The patient has had a history of iron deficiency anemia, and last week her GI physician told her to increase her iron to TID and increase Flinstones with iron to 2 tabs bid.The patient has not started this yet. She denies any nausea, vomiting, diarrhea, abdominal pain. The patient has chronic melanotic stools, but she takes iron. She denies any hematochezia, hematemesis, hemoptysis, hematuria, epistaxis.  In the emergency department, the patient was afebrile and hemodynamically stable with saturation of 96-100 percent on room air. Serum creatinine was 1.89 with a baseline of 0.9-1.3. CBC was unremarkable except for hemoglobin of 7.0. One unit of PRBC was ordered. Protonix was givenin the emergency department.  GI consulted and patient underwent EGD on 05/24/16.  EGD showed  source of bleeding and patient was given additional 2mg  of Vit K to reverse coumadin.  She was stable on 2/9 and was instructed to hold her coumadin until Tuesday 05/29/16 and to get blood work done at Dr. Olevia Perches office next week.  Discharge Diagnoses:  Active Problems:   Chronic atrial fibrillation (HCC)   Coronary atherosclerosis of native coronary artery   Chronic venous insufficiency   Chronic anticoagulation   Hyperlipidemia   Acute blood loss anemia   Chronic blood loss anemia   Chronic diastolic CHF (congestive heart failure) (HCC)   Acute renal failure superimposed on stage 3 chronic kidney disease (HCC)   Difficulty in walking, not elsewhere classified   Dizziness and giddiness   Gastrointestinal hemorrhage associated with peptic ulcer   Renal impairment    Discharge Instructions  Discharge Instructions    Call MD for:  difficulty breathing, headache or visual disturbances    Complete by:  As directed    Call MD for:  extreme fatigue    Complete by:  As directed    Call MD for:  hives    Complete by:  As directed    Call MD for:  persistant dizziness or light-headedness    Complete by:  As directed    Call MD for:  persistant nausea and vomiting    Complete by:  As directed    Call MD for:  severe uncontrolled pain    Complete by:  As directed    Call MD for:  temperature >100.4    Complete by:  As directed    Diet - low sodium heart healthy    Complete by:  As directed    Discharge instructions  Complete by:  As directed    Can restart coumadin on 05/29/16 Bloodwork at GI office in 1 week Follow up with GI   Increase activity slowly    Complete by:  As directed      Allergies as of 05/25/2016      Reactions   Diltiazem Other (See Comments)   Edema   Verapamil Other (See Comments)   Diarrhea      Medication List    STOP taking these medications   ferrous sulfate 325 (65 FE) MG tablet     TAKE these medications   atorvastatin 80 MG  tablet Commonly known as:  LIPITOR TAKE 1 TABLET EVERY NIGHT AT BEDTIME   bisoprolol 10 MG tablet Commonly known as:  ZEBETA take 1 tablet by mouth twice a day   digoxin 0.125 MG tablet Commonly known as:  LANOXIN Take 1 tablet (0.125 mg total) by mouth daily.   FLINTSTONES PLUS IRON PO Take by mouth 2 (two) times daily.   furosemide 40 MG tablet Commonly known as:  LASIX Take 80 mg am (2 tablets) am, take 40 mg ( 1 tablet) pm   losartan 100 MG tablet Commonly known as:  COZAAR Take 0.5 tablets (50 mg total) by mouth daily. What changed:  See the new instructions.   pantoprazole 40 MG tablet Commonly known as:  PROTONIX Take 1 tablet (40 mg total) by mouth 2 (two) times daily before a meal. What changed:  when to take this   potassium chloride SA 20 MEQ tablet Commonly known as:  K-DUR,KLOR-CON take 1 tablet by mouth once daily   terazosin 5 MG capsule Commonly known as:  HYTRIN take 1 capsule by mouth at bedtime   warfarin 5 MG tablet Commonly known as:  COUMADIN Can restart on 05/29/16. Then set up appointment to get INR checked and take as directed by [facility]. What changed:  additional instructions       Allergies  Allergen Reactions  . Diltiazem Other (See Comments)    Edema  . Verapamil Other (See Comments)    Diarrhea    Consultations:  Gastroenterology  CM  PT  Procedures/Studies: No results found.    Subjective: Patient is hopeful to discharge today.  Says she feels well and stronger.  Understands her discharge instructions.  Son is bedside and verbalizes understanding about patient taking her medication.  Discharge Exam: Vitals:   05/25/16 1020 05/25/16 1238  BP: (!) 128/50 (!) 117/46  Pulse:  74  Resp:  20  Temp:  98.5 F (36.9 C)   Vitals:   05/24/16 2055 05/25/16 0621 05/25/16 1020 05/25/16 1238  BP: (!) 90/33 (!) 110/37 (!) 128/50 (!) 117/46  Pulse: 61 66  74  Resp: 19 19  20   Temp: 97.8 F (36.6 C) 98.2 F (36.8  C)  98.5 F (36.9 C)  TempSrc: Oral Oral  Oral  SpO2: 94% 96%  96%  Weight:  83.6 kg (184 lb 6.4 oz)    Height:        General: Pt is alert, awake, not in acute distress Cardiovascular: RRR, S1/S2 +, no rubs, no gallops Respiratory: CTA bilaterally, no wheezing, no rhonchi Abdominal: Soft, NT, ND, bowel sounds + Extremities: 2+ edema of the lower extremities (chronic)    The results of significant diagnostics from this hospitalization (including imaging, microbiology, ancillary and laboratory) are listed below for reference.     Microbiology: No results found for this or any previous visit (from the past  240 hour(s)).   Labs: BNP (last 3 results) No results for input(s): BNP in the last 8760 hours. Basic Metabolic Panel:  Recent Labs Lab 05/22/16 1045 05/23/16 0607 05/25/16 0501  NA 136 137 139  K 4.3 4.0 4.5  CL 103 104 107  CO2 25 25 26   GLUCOSE 123* 114* 122*  BUN 49* 40* 24*  CREATININE 1.83* 1.49* 1.29*  CALCIUM 8.9 8.6* 8.7*   Liver Function Tests: No results for input(s): AST, ALT, ALKPHOS, BILITOT, PROT, ALBUMIN in the last 168 hours. No results for input(s): LIPASE, AMYLASE in the last 168 hours. No results for input(s): AMMONIA in the last 168 hours. CBC:  Recent Labs Lab 05/22/16 1045 05/23/16 0607 05/23/16 1558 05/24/16 0528 05/24/16 1314 05/25/16 0501  WBC 7.1 6.5  --  6.7  --  7.5  NEUTROABS 5.5  --   --   --   --   --   HGB 7.0* 7.9* 8.6* 8.1* 9.4* 9.3*  HCT 23.7* 25.8* 28.5* 26.5* 31.2* 30.3*  MCV 93.7 90.2  --  92.0  --  92.9  PLT 158 142*  --  143*  --  154   Cardiac Enzymes:  Recent Labs Lab 05/22/16 1729  CKTOTAL 32*   BNP: Invalid input(s): POCBNP CBG: No results for input(s): GLUCAP in the last 168 hours. D-Dimer No results for input(s): DDIMER in the last 72 hours. Hgb A1c No results for input(s): HGBA1C in the last 72 hours. Lipid Profile No results for input(s): CHOL, HDL, LDLCALC, TRIG, CHOLHDL, LDLDIRECT in the  last 72 hours. Thyroid function studies No results for input(s): TSH, T4TOTAL, T3FREE, THYROIDAB in the last 72 hours.  Invalid input(s): FREET3 Anemia work up  Recent Labs  05/22/16 1738  FERRITIN 24  TIBC 469*  IRON 50   Urinalysis    Component Value Date/Time   COLORURINE YELLOW 08/31/2009 1628   APPEARANCEUR CLEAR 08/31/2009 1628   LABSPEC 1.025 08/31/2009 1628   PHURINE 5.0 08/31/2009 1628   GLUCOSEU NEGATIVE 08/31/2009 1628   HGBUR NEGATIVE 08/31/2009 1628   BILIRUBINUR NEGATIVE 08/31/2009 1628   KETONESUR 15 (A) 08/31/2009 1628   PROTEINUR NEGATIVE 08/31/2009 1628   UROBILINOGEN 0.2 08/31/2009 1628   NITRITE NEGATIVE 08/31/2009 1628   LEUKOCYTESUR  08/31/2009 1628    NEGATIVE MICROSCOPIC NOT DONE ON URINES WITH NEGATIVE PROTEIN, BLOOD, LEUKOCYTES, NITRITE, OR GLUCOSE <1000 mg/dL.   Sepsis Labs Invalid input(s): PROCALCITONIN,  WBC,  LACTICIDVEN Microbiology No results found for this or any previous visit (from the past 240 hour(s)).   Time coordinating discharge: Over 30 minutes  SIGNED:   Loretha Stapler, MD  [facility] 05/25/2016, 1:46 PM Pager [phone] If 7PM-7AM, please contact night-coverage [email or web address] Password [email or web address]

## 2016-05-25 NOTE — Care Management Important Message (Signed)
Important Message  Patient Details  Name: [name] MRN: [id number] Date of Birth: 1933/03/04   Medicare Important Message Given:  Yes    Sherald Barge, RN 05/25/2016, 2:04 PM

## 2016-05-25 NOTE — Progress Notes (Signed)
  Subjective:  Patient has no complaints. She had one stool this morning was black. She has good appetite. She ate most of her breakfast. She denies abdominal pain or nausea.  Objective: Blood pressure (!) 110/37, pulse 66, temperature 98.2 F (36.8 C), temperature source Oral, resp. rate 19, height 5\' 1"  (1.549 m), weight 184 lb 6.4 oz (83.6 kg), SpO2 96 %. Patient is alert and in no acute distress. Abdomen is full but soft and nontender without organomegaly or masses. 2+ LE edema both legs.  Labs/studies Results:   Recent Labs  05/23/16 0607  05/24/16 0528 05/24/16 1314 05/25/16 0501  WBC 6.5  --  6.7  --  7.5  HGB 7.9*  < > 8.1* 9.4* 9.3*  HCT 25.8*  < > 26.5* 31.2* 30.3*  PLT 142*  --  143*  --  154  < > = values in this interval not displayed.  BMET   Recent Labs  05/22/16 1045 05/23/16 0607 05/25/16 0501  NA 136 137 139  K 4.3 4.0 4.5  CL 103 104 107  CO2 25 25 26   GLUCOSE 123* 114* 122*  BUN 49* 40* 24*  CREATININE 1.83* 1.49* 1.29*  CALCIUM 8.9 8.6* 8.7*      Recent Labs  05/24/16 1314 05/25/16 0501  LABPROT 25.0* 23.6*  INR 2.22 2.07     Assessment:  #1. GI bleed secondary to gastric AV malformation. Status post endoscopic therapy yesterday afternoonFor actively bleeding AVM. No change in hemoglobin the last 24 hours. She appears to be doing well. #2. Anemia secondary to acute and chronic GI bleed. She has received 3 units of PRBCs during this admission. #3. Chronic A. Fib and coronary artery disease. INR still above 2 despite 4 mg of vitamin K yesterday.    Recommendations:  Stable for discharge from GI standpoint. Hold by mouth iron for now. Resume warfarin on 05/29/2016. Continue PPI at twice a day schedule for 4 weeks. Thereafter once a day. Will check H&H in one week. My office will contact patient.

## 2016-05-28 ENCOUNTER — Ambulatory Visit: Payer: Self-pay | Admitting: Cardiology

## 2016-05-28 ENCOUNTER — Encounter (HOSPITAL_COMMUNITY): Payer: Self-pay | Admitting: Internal Medicine

## 2016-05-30 NOTE — Progress Notes (Signed)
Cardiology Office Note  Date: 06/01/2016   ID: [name], DOB [birth year], MRN [id number]  PCP: Purvis Kilts, MD  Primary Cardiologist: Rozann Lesches, MD   Chief Complaint  Patient presents with  . Leg Swelling    History of Present Illness: [name] is an 83 y.o. female last seen in January. I reviewed interval records, she was hospitalized with symptomatic anemia, hemoglobin down to 7.0. She was found to have gastric AVMs by EGD, did require PRBC transfusions. Coumadin was reversed. Dr. Olevia Perches note indicates planned resumption of Coumadin for February 13 - she did start back and will be getting an INR checked today.  At the last visit we increased her Lasix to 80 mg in the morning and 40 mg in the afternoon and used a temporary course of Zaroxolyn. We also added Lanoxin to her bisoprolol for additional heart rate control of her atrial fibrillation. She is here with her daughter for follow-up, reports no significant improvement in her leg swelling. She has lymphedema and fairly firm swelling noted. She has not been able to tolerate compression stockings previously. We discussed changing from Lasix to [facility] and also making a referral to the wound center in [place] for mechanical wraps.  Past Medical History:  Diagnosis Date  . Atrial fibrillation ([place])   . Carotid artery disease ([place])    RIght CEA 05/2004  . Chronic anticoagulation   . Coronary atherosclerosis of native coronary artery    RCA stent in 2003; residual 60% mid Cx and OM2  . Essential hypertension, benign   . Hyperlipidemia   . Obesity   . Renal artery stenosis (HCC)    Bilateral renal artery stents 2004  . Tobacco abuse, in remission    40 pack year stopped in 1989  . Venous insufficiency (chronic) (peripheral)    Pedal edema    Past Surgical History:  Procedure Laterality Date  . APPENDECTOMY    . CAROTID ENDARTERECTOMY     Right in 2005, left in 2010  . COLONOSCOPY  Never   Declines  . COLONOSCOPY N/A 08/19/2015   Procedure: COLONOSCOPY;  Surgeon: Rogene Houston, MD;  [facility];  Service: Endoscopy;  Laterality: N/A;  7:30  . CORONARY ANGIOPLASTY    . ESOPHAGOGASTRODUODENOSCOPY N/A 08/19/2015   Procedure: ESOPHAGOGASTRODUODENOSCOPY (EGD);  Surgeon: Rogene Houston, MD;  Location: [facility];  Service: Endoscopy;  Laterality: N/A;  . ESOPHAGOGASTRODUODENOSCOPY N/A 05/24/2016   Procedure: ESOPHAGOGASTRODUODENOSCOPY (EGD);  Surgeon: Rogene Houston, MD;  Location: [facility];  Service: Endoscopy;  Laterality: N/A;    Current Outpatient Prescriptions  Medication Sig Dispense Refill  . atorvastatin (LIPITOR) 80 MG tablet TAKE 1 TABLET EVERY NIGHT AT BEDTIME 60 tablet 6  . bisoprolol (ZEBETA) 10 MG tablet take 1 tablet by mouth twice a day 60 tablet 6  . digoxin (LANOXIN) 0.125 MG tablet Take 1 tablet (0.125 mg total) by mouth daily. 90 tablet 3  . losartan (COZAAR) 100 MG tablet Take 0.5 tablets (50 mg total) by mouth daily. 90 tablet 1  . pantoprazole (PROTONIX) 40 MG tablet Take 1 tablet (40 mg total) by mouth 2 (two) times daily before a meal. 60 tablet 0  . Pediatric Multivitamins-Iron (FLINTSTONES PLUS IRON PO) Take by mouth 2 (two) times daily.    . potassium chloride SA (K-DUR,KLOR-CON) 20 MEQ tablet take 1 tablet by mouth once daily 30 tablet 11  . terazosin (HYTRIN) 5 MG capsule take 1 capsule by  mouth at bedtime 90 capsule 3  . warfarin (COUMADIN) 5 MG tablet Can restart on 05/29/16. Then set up appointment to get INR checked and take as directed by [facility]. 40 tablet 3  . torsemide (DEMADEX) 20 MG tablet Take 4 tablets (80 mg total) in the morning.  Take 2 tablets (40 mg total) in the evening. 180 tablet 3   No current facility-administered medications for this visit.    Allergies:  Diltiazem and Verapamil   Social History: The patient  reports that she quit smoking about 29 years ago. Her smoking use included Cigarettes. She has  never used smokeless tobacco. She reports that she does not drink alcohol or use drugs.   ROS:  Please see the history of present illness. Otherwise, complete review of systems is positive for leg swelling.  All other systems are reviewed and negative.   Physical Exam: VS:  BP 104/60   Pulse 72   Ht 5\' 1"  (1.549 m)   Wt 181 lb (82.1 kg)   SpO2 95%   BMI 34.20 kg/m , BMI Body mass index is 34.2 kg/m.  Wt Readings from Last 3 Encounters:  06/01/16 181 lb (82.1 kg)  05/25/16 184 lb 6.4 oz (83.6 kg)  05/07/16 190 lb (86.2 kg)    Pleasant elderly woman, no distress.  HEENT: Conjunctiva and lids normal, oropharynx clear.  Neck: Supple, no elevated JVP or carotid bruits, no thyromegaly.  Lungs: Clear to auscultation, nonlabored breathing at rest.  Cardiac: Irregularly irregular, no S3 or significant systolic murmur, no pericardial rub.  Abdomen: Soft, nontender,bowel sounds present.  Extremities: Chronic woody appearing leg edema and lymphedema, distal pulses 1+.  Skin: Warm and dry.  Musculoskeletal: No kyphosis.  Neuropsychiatric: Alert and oriented x3, affect grossly appropriate.  ECG: I personally reviewed the tracing from 05/22/2016 which showed rate-controlled atrial fibrillation with low voltage, repolarization abnormalities.  Recent Labwork: 04/24/2016: ALT 17; AST 27 05/09/2016: TSH 1.76 05/25/2016: BUN 24; Creatinine, Ser 1.29; Hemoglobin 9.3; Platelets 154; Potassium 4.5; Sodium 139     Component Value Date/Time   CHOL 91 (L) 12/22/2014 1035   TRIG 79 12/22/2014 1035   HDL 34 (L) 12/22/2014 1035   CHOLHDL 2.7 12/22/2014 1035   VLDL 16 12/22/2014 1035   LDLCALC 41 12/22/2014 1035    Other Studies Reviewed Today:  Echocardiogram 07/07/2015: Study Conclusions  - Left ventricle: The cavity size was normal. Wall thickness was   increased in a pattern of mild LVH. Systolic function was normal.   The estimated ejection fraction was in the range of 60% to 65%. -  Mitral valve: There was mild regurgitation. - Left atrium: The atrium was mildly dilated. - Right ventricle: The cavity size was moderately dilated. - Right atrium: The atrium was moderately dilated. - Atrial septum: No defect or patent foramen ovale was identified. - Tricuspid valve: There was moderate regurgitation. - Pulmonary arteries: PA peak pressure: 42 mm Hg (S).  Assessment and Plan:  1. Leg edema and lymphedema, not significantly improved after increase in Lasix dosing. We will change to Demadex 80 mg the morning and 40 mg in the evening, follow-up with BMET in 2 weeks. Also making referral to wound center in [facility] for mechanical compression wraps.  2. Chronic atrial fibrillation. Continues on bisoprolol and Lanoxin for heart rate control, also Coumadin.  3. Recent blood loss anemia with gastric AVMs by EGD. She is being followed by Dr. Laural Golden. Coumadin resumed, will need to be followed for recurring anemia  that might necessitate discontinuation of chronic anticoagulation.  4. CAD status post RCA stenting in 2003 with moderate residual circumflex and OM disease is being managed medically. No active angina symptoms.  Current medicines were reviewed with the patient today.  Disposition: Follow-up in a few weeks.  Signed, Satira Sark, MD, [facility] 06/01/2016 12:28 PM    [place] [facility] [address] [place], [place] [address] Phone: [phone]; Fax: [phone]

## 2016-06-01 ENCOUNTER — Ambulatory Visit (INDEPENDENT_AMBULATORY_CARE_PROVIDER_SITE_OTHER): Payer: Medicare Other | Admitting: Cardiology

## 2016-06-01 ENCOUNTER — Encounter: Payer: Self-pay | Admitting: Cardiology

## 2016-06-01 VITALS — BP 104/60 | HR 72 | Ht 61.0 in | Wt 181.0 lb

## 2016-06-01 DIAGNOSIS — Z5181 Encounter for therapeutic drug level monitoring: Secondary | ICD-10-CM | POA: Diagnosis not present

## 2016-06-01 DIAGNOSIS — I890 Lymphedema, not elsewhere classified: Secondary | ICD-10-CM | POA: Diagnosis not present

## 2016-06-01 DIAGNOSIS — I2510 Atherosclerotic heart disease of native coronary artery without angina pectoris: Secondary | ICD-10-CM

## 2016-06-01 DIAGNOSIS — I4891 Unspecified atrial fibrillation: Secondary | ICD-10-CM | POA: Diagnosis not present

## 2016-06-01 DIAGNOSIS — I482 Chronic atrial fibrillation, unspecified: Secondary | ICD-10-CM

## 2016-06-01 DIAGNOSIS — D500 Iron deficiency anemia secondary to blood loss (chronic): Secondary | ICD-10-CM

## 2016-06-01 LAB — POCT INR: INR: 1.5

## 2016-06-01 MED ORDER — TORSEMIDE 20 MG PO TABS: ORAL_TABLET | ORAL | 3 refills | Status: DC

## 2016-06-01 NOTE — Patient Instructions (Addendum)
Medication Instructions:    Your physician has recommended you make the following change in your medication:  1) STOP Lasix 2) START Demadex (torsemide) -- Take 80 mg in the morning.  Take 40 mg in the evening.  --- If you need a refill on your cardiac medications before your next appointment, please call your pharmacy. ---  Labwork:  None ordered  Testing/Procedures:  None ordered  Follow-Up:  Your physician recommends that you schedule a follow-up appointment in: 2-3 weeks with Dr. [name]  (you will have lab work at this appointment)   Any Other Special Instructions Will Be Listed Below (If Applicable). Someone will contact you about referral to the wound clinic in [place] for your lymphedema  Thank you for choosing [facility]!!

## 2016-06-01 NOTE — Addendum Note (Signed)
Addended by: Debbora Lacrosse R on: 06/01/2016 02:26 PM   Modules accepted: Orders

## 2016-06-06 ENCOUNTER — Ambulatory Visit (INDEPENDENT_AMBULATORY_CARE_PROVIDER_SITE_OTHER): Payer: Medicare Other | Admitting: *Deleted

## 2016-06-06 DIAGNOSIS — I482 Chronic atrial fibrillation, unspecified: Secondary | ICD-10-CM

## 2016-06-06 DIAGNOSIS — I4891 Unspecified atrial fibrillation: Secondary | ICD-10-CM | POA: Diagnosis not present

## 2016-06-06 DIAGNOSIS — I2510 Atherosclerotic heart disease of native coronary artery without angina pectoris: Secondary | ICD-10-CM

## 2016-06-06 DIAGNOSIS — Z5181 Encounter for therapeutic drug level monitoring: Secondary | ICD-10-CM

## 2016-06-06 LAB — POCT INR: INR: 3

## 2016-06-15 ENCOUNTER — Ambulatory Visit: Payer: Self-pay | Admitting: Cardiology

## 2016-06-17 ENCOUNTER — Emergency Department (HOSPITAL_COMMUNITY): Payer: Medicare Other

## 2016-06-17 ENCOUNTER — Inpatient Hospital Stay (HOSPITAL_COMMUNITY)
Admission: EM | Admit: 2016-06-17 | Discharge: 2016-06-21 | DRG: 378 | Disposition: A | Discharge status: Home / Self Care | Payer: Medicare Other | Attending: Internal Medicine | Admitting: Internal Medicine

## 2016-06-17 ENCOUNTER — Encounter (HOSPITAL_COMMUNITY): Payer: Self-pay | Admitting: Emergency Medicine

## 2016-06-17 DIAGNOSIS — I872 Venous insufficiency (chronic) (peripheral): Secondary | ICD-10-CM | POA: Diagnosis present

## 2016-06-17 DIAGNOSIS — I5032 Chronic diastolic (congestive) heart failure: Secondary | ICD-10-CM | POA: Diagnosis present

## 2016-06-17 DIAGNOSIS — I2510 Atherosclerotic heart disease of native coronary artery without angina pectoris: Secondary | ICD-10-CM | POA: Diagnosis present

## 2016-06-17 DIAGNOSIS — K31811 Angiodysplasia of stomach and duodenum with bleeding: Principal | ICD-10-CM | POA: Diagnosis present

## 2016-06-17 DIAGNOSIS — I110 Hypertensive heart disease with heart failure: Secondary | ICD-10-CM | POA: Diagnosis not present

## 2016-06-17 DIAGNOSIS — I959 Hypotension, unspecified: Secondary | ICD-10-CM | POA: Diagnosis not present

## 2016-06-17 DIAGNOSIS — D500 Iron deficiency anemia secondary to blood loss (chronic): Secondary | ICD-10-CM | POA: Diagnosis not present

## 2016-06-17 DIAGNOSIS — I482 Chronic atrial fibrillation, unspecified: Secondary | ICD-10-CM | POA: Diagnosis present

## 2016-06-17 DIAGNOSIS — K449 Diaphragmatic hernia without obstruction or gangrene: Secondary | ICD-10-CM | POA: Diagnosis present

## 2016-06-17 DIAGNOSIS — R569 Unspecified convulsions: Secondary | ICD-10-CM

## 2016-06-17 DIAGNOSIS — N179 Acute kidney failure, unspecified: Secondary | ICD-10-CM | POA: Diagnosis not present

## 2016-06-17 DIAGNOSIS — Z955 Presence of coronary angioplasty implant and graft: Secondary | ICD-10-CM

## 2016-06-17 DIAGNOSIS — K922 Gastrointestinal hemorrhage, unspecified: Secondary | ICD-10-CM | POA: Diagnosis not present

## 2016-06-17 DIAGNOSIS — R531 Weakness: Secondary | ICD-10-CM | POA: Diagnosis not present

## 2016-06-17 DIAGNOSIS — D649 Anemia, unspecified: Secondary | ICD-10-CM | POA: Diagnosis not present

## 2016-06-17 DIAGNOSIS — Z7901 Long term (current) use of anticoagulants: Secondary | ICD-10-CM

## 2016-06-17 DIAGNOSIS — Z87891 Personal history of nicotine dependence: Secondary | ICD-10-CM

## 2016-06-17 DIAGNOSIS — Z79899 Other long term (current) drug therapy: Secondary | ICD-10-CM

## 2016-06-17 DIAGNOSIS — I5033 Acute on chronic diastolic (congestive) heart failure: Secondary | ICD-10-CM | POA: Diagnosis present

## 2016-06-17 DIAGNOSIS — Z66 Do not resuscitate: Secondary | ICD-10-CM | POA: Diagnosis present

## 2016-06-17 DIAGNOSIS — I248 Other forms of acute ischemic heart disease: Secondary | ICD-10-CM | POA: Diagnosis present

## 2016-06-17 DIAGNOSIS — R55 Syncope and collapse: Secondary | ICD-10-CM | POA: Diagnosis present

## 2016-06-17 DIAGNOSIS — I890 Lymphedema, not elsewhere classified: Secondary | ICD-10-CM | POA: Diagnosis present

## 2016-06-17 DIAGNOSIS — R42 Dizziness and giddiness: Secondary | ICD-10-CM | POA: Diagnosis not present

## 2016-06-17 DIAGNOSIS — E785 Hyperlipidemia, unspecified: Secondary | ICD-10-CM | POA: Diagnosis present

## 2016-06-17 LAB — COMPREHENSIVE METABOLIC PANEL
ALBUMIN: 3 g/dL — AB (ref 3.5–5.0)
ALT: 16 U/L (ref 14–54)
ANION GAP: 11 (ref 5–15)
AST: 34 U/L (ref 15–41)
Alkaline Phosphatase: 77 U/L (ref 38–126)
BUN: 32 mg/dL — ABNORMAL HIGH (ref 6–20)
CO2: 23 mmol/L (ref 22–32)
Calcium: 8.7 mg/dL — ABNORMAL LOW (ref 8.9–10.3)
Chloride: 103 mmol/L (ref 101–111)
Creatinine, Ser: 1.72 mg/dL — ABNORMAL HIGH (ref 0.44–1.00)
GFR calc Af Amer: 31 mL/min — ABNORMAL LOW (ref >60)
GFR calc non Af Amer: 27 mL/min — ABNORMAL LOW (ref >60)
Glucose, Bld: 134 mg/dL — ABNORMAL HIGH (ref 65–99)
POTASSIUM: 3.9 mmol/L (ref 3.5–5.1)
SODIUM: 137 mmol/L (ref 135–145)
Total Bilirubin: 0.8 mg/dL (ref 0.3–1.2)
Total Protein: 6.2 g/dL — ABNORMAL LOW (ref 6.5–8.1)

## 2016-06-17 LAB — I-STAT CG4 LACTIC ACID, ED
Lactic Acid, Venous: 4.73 mmol/L (ref 0.5–1.9)
Lactic Acid, Venous: 4.39 mmol/L (ref 0.5–1.9)

## 2016-06-17 LAB — I-STAT CHEM 8, ED
BUN: 31 mg/dL — AB (ref 6–20)
CALCIUM ION: 1.12 mmol/L — AB (ref 1.15–1.40)
CHLORIDE: 103 mmol/L (ref 101–111)
CREATININE: 1.9 mg/dL — AB (ref 0.44–1.00)
Glucose, Bld: 130 mg/dL — ABNORMAL HIGH (ref 65–99)
HEMATOCRIT: 22 % — AB (ref 36.0–46.0)
Hemoglobin: 7.5 g/dL — ABNORMAL LOW (ref 12.0–15.0)
Potassium: 3.8 mmol/L (ref 3.5–5.1)
Sodium: 142 mmol/L (ref 135–145)
TCO2: 24 mmol/L (ref 0–100)

## 2016-06-17 LAB — CBC WITH DIFFERENTIAL/PLATELET
BASOS ABS: 0 10*3/uL (ref 0.0–0.1)
Basophils Relative: 1 %
EOS PCT: 1 %
Eosinophils Absolute: 0.1 10*3/uL (ref 0.0–0.7)
HCT: 23.3 % — ABNORMAL LOW (ref 36.0–46.0)
Hemoglobin: 7.1 g/dL — ABNORMAL LOW (ref 12.0–15.0)
LYMPHS ABS: 0.7 10*3/uL (ref 0.7–4.0)
Lymphocytes Relative: 11 %
MCH: 28.5 pg (ref 26.0–34.0)
MCHC: 30.5 g/dL (ref 30.0–36.0)
MCV: 93.6 fL (ref 78.0–100.0)
Monocytes Absolute: 0.8 10*3/uL (ref 0.1–1.0)
Monocytes Relative: 12 %
NEUTROS ABS: 5.2 10*3/uL (ref 1.7–7.7)
Neutrophils Relative %: 75 %
Platelets: 166 10*3/uL (ref 150–400)
RBC: 2.49 MIL/uL — ABNORMAL LOW (ref 3.87–5.11)
RDW: 16.8 % — AB (ref 11.5–15.5)
WBC: 6.8 10*3/uL (ref 4.0–10.5)

## 2016-06-17 LAB — DIGOXIN LEVEL: DIGOXIN LVL: 0.7 ng/mL — AB (ref 0.8–2.0)

## 2016-06-17 LAB — POC OCCULT BLOOD, ED: Fecal Occult Bld: POSITIVE — AB

## 2016-06-17 LAB — MRSA PCR SCREENING: MRSA BY PCR: NEGATIVE

## 2016-06-17 LAB — PROTIME-INR
INR: 3.37
PROTHROMBIN TIME: 34.9 s — AB (ref 11.4–15.2)

## 2016-06-17 LAB — TROPONIN I
Troponin I: 0.17 ng/mL (ref <0.03)
Troponin I: 0.15 ng/mL (ref <0.03)

## 2016-06-17 LAB — PREPARE RBC (CROSSMATCH)

## 2016-06-17 MED ORDER — PANTOPRAZOLE SODIUM 40 MG IV SOLR
40.0000 mg | Freq: Once | INTRAVENOUS | Status: AC
  Administered 2016-06-17: 40 mg via INTRAVENOUS
  Filled 2016-06-17: qty 40

## 2016-06-17 MED ORDER — DIGOXIN 125 MCG PO TABS
0.1250 mg | ORAL_TABLET | Freq: Every day | ORAL | Status: DC
  Administered 2016-06-17 – 2016-06-21 (×5): 0.125 mg via ORAL
  Filled 2016-06-17 (×5): qty 1

## 2016-06-17 MED ORDER — VITAMIN K1 10 MG/ML IJ SOLN
10.0000 mg | Freq: Once | INTRAMUSCULAR | Status: AC
  Administered 2016-06-17: 10 mg via SUBCUTANEOUS
  Filled 2016-06-17: qty 1

## 2016-06-17 MED ORDER — SODIUM CHLORIDE 0.9 % IV SOLN
Freq: Once | INTRAVENOUS | Status: AC
  Administered 2016-06-17: 15:00:00 via INTRAVENOUS

## 2016-06-17 MED ORDER — ONDANSETRON HCL 4 MG/2ML IJ SOLN
4.0000 mg | Freq: Four times a day (QID) | INTRAMUSCULAR | Status: DC | PRN
  Filled 2016-06-17: qty 2

## 2016-06-17 MED ORDER — ACETAMINOPHEN 325 MG PO TABS
650.0000 mg | ORAL_TABLET | Freq: Four times a day (QID) | ORAL | Status: DC | PRN
  Administered 2016-06-19: 650 mg via ORAL
  Filled 2016-06-17: qty 2

## 2016-06-17 MED ORDER — SODIUM CHLORIDE 0.9 % IV SOLN
10.0000 mL/h | Freq: Once | INTRAVENOUS | Status: AC
  Administered 2016-06-17: 10 mL/h via INTRAVENOUS

## 2016-06-17 MED ORDER — ATORVASTATIN CALCIUM 40 MG PO TABS
80.0000 mg | ORAL_TABLET | Freq: Every day | ORAL | Status: DC
  Administered 2016-06-17 – 2016-06-20 (×4): 80 mg via ORAL
  Filled 2016-06-17 (×4): qty 2

## 2016-06-17 MED ORDER — ONDANSETRON HCL 4 MG PO TABS
4.0000 mg | ORAL_TABLET | Freq: Four times a day (QID) | ORAL | Status: DC | PRN
  Filled 2016-06-17: qty 1

## 2016-06-17 MED ORDER — PANTOPRAZOLE SODIUM 40 MG IV SOLR
40.0000 mg | Freq: Two times a day (BID) | INTRAVENOUS | Status: DC
  Administered 2016-06-17 – 2016-06-21 (×8): 40 mg via INTRAVENOUS
  Filled 2016-06-17 (×8): qty 40

## 2016-06-17 MED ORDER — ACETAMINOPHEN 650 MG RE SUPP: 650.0000 mg | Freq: Four times a day (QID) | RECTAL | Status: DC | PRN

## 2016-06-17 MED ORDER — SODIUM CHLORIDE 0.9 % IV BOLUS (SEPSIS)
2000.0000 mL | Freq: Once | INTRAVENOUS | Status: AC
  Administered 2016-06-17: 2000 mL via INTRAVENOUS

## 2016-06-17 MED ORDER — DEXTROSE 5 % IV SOLN
1.0000 g | Freq: Once | INTRAVENOUS | Status: AC
  Administered 2016-06-17: 1 g via INTRAVENOUS
  Filled 2016-06-17: qty 10

## 2016-06-17 NOTE — H&P (Addendum)
TRH H&P    Patient Demographics:    [name], is a 83 y.o. female  MRN: [id number]  DOB - [birth year]  Admit Date - 06/17/2016  Referring MD/NP/PA: Dr. Roderic Palau  Outpatient Primary MD for the patient is Purvis Kilts, MD  Patient coming from: Home  Chief Complaint  Patient presents with  . Dizziness      HPI:    [name]  is a 83 y.o. female, With history of GI bleed secondary to gastric AV malformation, status post endoscopic therapy for active bleeding AVM in February 2018, atrial fibrillation, on chronic anticoagulation with Coumadin, coronary atherosclerosis, hypertension who came to hospital with chief complaint of dizziness, patient also had episode of vomiting and diarrhea on Thursday, and on Friday and Saturday she felt generally weak. She denies passing out.  In the ED, patient had a witnessed seizure-like activity, as per RN. This lasted for about 5 minutes, patient surprisingly was not postictal. No tongue biting. She had hypotension in the ED with systolic blood pressure in 60s.  She denies chest pain, no shortness of breath. Complains of abdominal pain, denies fever or dysuria. She has noticed black colored stool but she takes iron, no frank blood in the stool or vomitus. In the ED, patient was found to be anemic with hemoglobin 7.5, 1 unit PRBC ordered Also had mild elevation of troponin 0.17.   Review of systems:    In addition to the HPI above,  No Fever-chills, No Headache, No changes with Vision or hearing, No problems swallowing food or Liquids, No Chest pain, Cough or Shortness of Breath, No dysuria, No new skin rashes or bruises, No new joints pains-aches,  No new weakness, tingling, numbness in any extremity, No recent weight gain or loss, No polyuria, polydypsia or polyphagia, No significant Mental Stressors.  A full 10 point Review of Systems was done,  except as stated above, all other Review of Systems were negative.   With Past History of the following :    Past Medical History:  Diagnosis Date  . Atrial fibrillation ([place])   . Carotid artery disease ([place])    RIght CEA 05/2004  . Chronic anticoagulation   . Coronary atherosclerosis of native coronary artery    RCA stent in 2003; residual 60% mid Cx and OM2  . Essential hypertension, benign   . Hyperlipidemia   . Obesity   . Renal artery stenosis (HCC)    Bilateral renal artery stents 2004  . Tobacco abuse, in remission    40 pack year stopped in 1989  . Venous insufficiency (chronic) (peripheral)    Pedal edema      Past Surgical History:  Procedure Laterality Date  . APPENDECTOMY    . CAROTID ENDARTERECTOMY     Right in 2005, left in 2010  . COLONOSCOPY  Never   Declines  . COLONOSCOPY N/A 08/19/2015   Procedure: COLONOSCOPY;  Surgeon: Rogene Houston, MD;  [facility];  Service: Endoscopy;  Laterality: N/A;  7:30  . CORONARY ANGIOPLASTY    .  ESOPHAGOGASTRODUODENOSCOPY N/A 08/19/2015   Procedure: ESOPHAGOGASTRODUODENOSCOPY (EGD);  Surgeon: Rogene Houston, MD;  [facility];  Service: Endoscopy;  Laterality: N/A;  . ESOPHAGOGASTRODUODENOSCOPY N/A 05/24/2016   Procedure: ESOPHAGOGASTRODUODENOSCOPY (EGD);  Surgeon: Rogene Houston, MD;  [facility];  Service: Endoscopy;  Laterality: N/A;      Social History:      Social History  Substance Use Topics  . Smoking status: Former Smoker    Types: Cigarettes    Quit date: 04/30/1987  . Smokeless tobacco: Never Used  . Alcohol use No       Family History :     Family History  Problem Relation Age of Onset  . Lung cancer Mother   . Cancer Mother   . Hypertension Daughter   . Hyperlipidemia Daughter   . Hypertension Son   . Hypertension Daughter   . Heart disease Maternal Grandmother       Home Medications:   Prior to Admission medications   Medication Sig Start Date End  Date Taking? Authorizing Provider  atorvastatin (LIPITOR) 80 MG tablet TAKE 1 TABLET EVERY NIGHT AT BEDTIME 05/07/16   Satira Sark, MD  bisoprolol (ZEBETA) 10 MG tablet take 1 tablet by mouth twice a day 02/09/16   Satira Sark, MD  digoxin (LANOXIN) 0.125 MG tablet Take 1 tablet (0.125 mg total) by mouth daily. 05/07/16   Satira Sark, MD  losartan (COZAAR) 100 MG tablet Take 0.5 tablets (50 mg total) by mouth daily. 05/25/16   Eber Jones, MD  pantoprazole (PROTONIX) 40 MG tablet Take 1 tablet (40 mg total) by mouth 2 (two) times daily before a meal. 05/25/16   Eber Jones, MD  Pediatric Multivitamins-Iron (FLINTSTONES PLUS IRON PO) Take by mouth 2 (two) times daily.    Historical Provider, MD  potassium chloride SA (K-DUR,KLOR-CON) 20 MEQ tablet take 1 tablet by mouth once daily 02/09/16   Satira Sark, MD  terazosin (HYTRIN) 5 MG capsule take 1 capsule by mouth at bedtime 09/08/15   Satira Sark, MD  torsemide (DEMADEX) 20 MG tablet Take 4 tablets (80 mg total) in the morning.  Take 2 tablets (40 mg total) in the evening. Patient taking differently: Take 40-80 mg by mouth 2 (two) times daily. Take 4 tablets (80 mg total) in the morning.  Take 2 tablets (40 mg total) in the evening. 06/01/16   Satira Sark, MD  warfarin (COUMADIN) 5 MG tablet Can restart on 05/29/16. Then set up appointment to get INR checked and take as directed by [facility]. 05/25/16   Eber Jones, MD     Allergies:     Allergies  Allergen Reactions  . Diltiazem Other (See Comments)    Edema  . Verapamil Other (See Comments)    Diarrhea     Physical Exam:   Vitals  Blood pressure (!) 91/43, pulse 65, temperature 97.5 F (36.4 C), temperature source Oral, resp. rate 18, height 5\' 1"  (1.549 m), weight 82.1 kg (181 lb), SpO2 98 %.  1.  General: Appears in no acute distress  2. Psychiatric:  Intact judgement and  insight, awake alert, oriented x 3.  3.  Neurologic: No focal neurological deficits, all cranial nerves intact.Strength 5/5 all 4 extremities, sensation intact all 4 extremities, plantars down going.  4. Eyes :  anicteric sclerae, moist conjunctivae with no lid lag. PERRLA.  5. ENMT:  Oropharynx clear with moist mucous membranes and good dentition  6. Neck:  supple, no cervical lymphadenopathy appriciated, No thyromegaly  7. Respiratory : Normal respiratory effort, good air movement bilaterally,clear to  auscultation bilaterally  8. Cardiovascular : RRR, no gallops, rubs or murmurs, no leg edema  9. Gastrointestinal:  Positive bowel sounds, abdomen soft, positive tenderness to palpation in right lower quadrant,no hepatosplenomegaly, no rigidity or guarding       10. Skin:  No cyanosis, normal texture and turgor, no rash, lesions or ulcers  11.Musculoskeletal:  Good muscle tone,  joints appear normal , no effusions,  normal range of motion    Data Review:    CBC  Recent Labs Lab 06/17/16 1424 06/17/16 1452  WBC 6.8  --   HGB 7.1* 7.5*  HCT 23.3* 22.0*  PLT 166  --   MCV 93.6  --   MCH 28.5  --   MCHC 30.5  --   RDW 16.8*  --   LYMPHSABS 0.7  --   MONOABS 0.8  --   EOSABS 0.1  --   BASOSABS 0.0  --    ------------------------------------------------------------------------------------------------------------------  Chemistries   Recent Labs Lab 06/17/16 1424 06/17/16 1452  NA 137 142  K 3.9 3.8  CL 103 103  CO2 23  --   GLUCOSE 134* 130*  BUN 32* 31*  CREATININE 1.72* 1.90*  CALCIUM 8.7*  --   AST 34  --   ALT 16  --   ALKPHOS 77  --   BILITOT 0.8  --    ------------------------------------------------------------------------------------------------------------------  ------------------------------------------------------------------------------------------------------------------ GFR: Estimated Creatinine Clearance: 22.5 mL/min (by C-G formula based on SCr of 1.9 mg/dL (H)). Liver  Function Tests:  Recent Labs Lab 06/17/16 1424  AST 34  ALT 16  ALKPHOS 77  BILITOT 0.8  PROT 6.2*  ALBUMIN 3.0*   No results for input(s): LIPASE, AMYLASE in the last 168 hours. No results for input(s): AMMONIA in the last 168 hours. Coagulation Profile:  Recent Labs Lab 06/17/16 1424  INR 3.37   Cardiac Enzymes:  Recent Labs Lab 06/17/16 1424  TROPONINI 0.17*    --------------------------------------------------------------------------------------------------------------- Urine analysis:    Component Value Date/Time   COLORURINE YELLOW 08/31/2009 1628   APPEARANCEUR CLEAR 08/31/2009 1628   LABSPEC 1.025 08/31/2009 1628   PHURINE 5.0 08/31/2009 1628   GLUCOSEU NEGATIVE 08/31/2009 1628   HGBUR NEGATIVE 08/31/2009 1628   BILIRUBINUR NEGATIVE 08/31/2009 1628   KETONESUR 15 (A) 08/31/2009 1628   PROTEINUR NEGATIVE 08/31/2009 1628   UROBILINOGEN 0.2 08/31/2009 1628   NITRITE NEGATIVE 08/31/2009 1628   LEUKOCYTESUR  08/31/2009 1628    NEGATIVE MICROSCOPIC NOT DONE ON URINES WITH NEGATIVE PROTEIN, BLOOD, LEUKOCYTES, NITRITE, OR GLUCOSE <1000 mg/dL.      Imaging Results:    Dg Chest Portable 1 View  Result Date: 06/17/2016 CLINICAL DATA:  Weakness. EXAM: PORTABLE CHEST 1 VIEW COMPARISON:  Radiographs of Sep 02, 2009. FINDINGS: Stable cardiomediastinal silhouette. Atherosclerosis of thoracic aorta is noted. No pneumothorax or pleural effusion is noted. Both lungs are clear. The visualized skeletal structures are unremarkable. IMPRESSION: No acute cardiopulmonary abnormality seen.  Aortic atherosclerosis. Electronically Signed   By: Marijo Conception, M.D.   On: 06/17/2016 15:08    My personal review of EKG: Rhythm-- atrial fibrillation   Assessment & Plan:    Active Problems:   Chronic atrial fibrillation (HCC)   Chronic anticoagulation   Symptomatic anemia   Chronic blood loss anemia   Chronic diastolic CHF (congestive heart failure) (HCC)   GI  bleed  1. Symptomatic anemia-secondary to chronic GI bleeding,  patient's hemoglobin is 7.5,  Last hemoglobin on 05/25/16 was 9.3 , and 1 unit PRBC ordered. Will check H&H every 8 hours, and transfuse PRBC as needed for hemoglobin less than 7 2. Chronic GI bleeding- patient underwent EGD last month and was found to have gastric AVM, which was coagulated. We'll start Protonix 40 mg IV every 12 hours. GI has been consulted by the ED physician. 3. Atrial fibrillation- heart rate is controlled, continue digoxin. Will hold Coumadin at this time. 4. Chronic anticoagulation-patient is on chronic anti-correlation with Coumadin, today INR is 3.37. Vitamin K given in the ED. Will check INR in a.m. As patient is having chronic GI blood loss, cardiology has been consulted for further recommendations regarding continuing Coumadin versus stopping it. 5. Chronic diastolic CHF- patient takes high-dose torsemide at home, she was hypotensive in the ED. But hold diuretics at this time. Will avoid giving excessive IV fluids as patient is at risk for fluid overload from underlying diastolic CHF  6. Syncope versus seizure- patient had an episode of unresponsive mass with eyes rolled backwards with tightening of her arms in the ED which lasted for what 5 minutes as per patient's daughter. She does not have previous history of seizures, will check CT head and also obtain EEG. Patient blood pressure in the ED has been running low, systolic blood pressure was in 60s, so she will also have had syncope. Will monitor on telemetry. 7. Elevated troponin- patient has a left troponin 0.17 in the ED likely from demand ischemia from hypotension. EKG shows atrial fibrillation, cartilage has been consulted for further recommendations. Will cycle troponin every 6 hours 3.    DVT Prophylaxis-   SCDs   AM Labs Ordered, also please review Full Orders  Family Communication: Admission, patients condition and plan of care including tests  being ordered have been discussed with the patient and Her daughters at bedside* who indicate understanding and agree with the plan and Code Status.  Code Status:  DO NOT RESUSCITATE  Admission status: Observation  Time spent in minutes : 60 minutes   Benzion Mesta S M.D on 06/17/2016 at 4:13 PM  Between 7am to 7pm - Pager - [phone]. After 7pm go to [email or web address] - password [facility]  [facility] - Office  [phone]

## 2016-06-17 NOTE — ED Triage Notes (Signed)
Pt reports dizziness without h/a x4 days with fluctuating blood pressure.  Pt had blood transfusion 3 weeks ago for an ulcer and had ulcer "clipped." Pt appears pale.

## 2016-06-17 NOTE — ED Provider Notes (Addendum)
[place] DEPT Provider Note   CSN: [id number] Arrival date & time: 06/17/16  1404     History   Chief Complaint Chief Complaint  Patient presents with  . Dizziness    HPI [name] is a 83 y.o. female.  Patient complains of feeling weak and dizzy for 4 days. One month ago she had bleeding from AVM in her stomach and had to be transfused.   The history is provided by the patient.  Dizziness  Quality:  Lightheadedness Severity:  Moderate Onset quality:  Sudden Timing:  Constant Progression:  Unable to specify Chronicity:  New Context: not when bending over   Associated symptoms: no chest pain, no diarrhea and no headaches     Past Medical History:  Diagnosis Date  . Atrial fibrillation ([place])   . Carotid artery disease ([place])    RIght CEA 05/2004  . Chronic anticoagulation   . Coronary atherosclerosis of native coronary artery    RCA stent in 2003; residual 60% mid Cx and OM2  . Essential hypertension, benign   . Hyperlipidemia   . Obesity   . Renal artery stenosis (HCC)    Bilateral renal artery stents 2004  . Tobacco abuse, in remission    40 pack year stopped in 1989  . Venous insufficiency (chronic) (peripheral)    Pedal edema    Patient Active Problem List   Diagnosis Date Noted  . Difficulty in walking, not elsewhere classified   . Dizziness and giddiness   . Gastrointestinal hemorrhage associated with peptic ulcer   . Renal impairment   . Acute blood loss anemia 05/22/2016  . Chronic blood loss anemia 05/22/2016  . Chronic diastolic CHF (congestive heart failure) ([place]) 05/22/2016  . Acute renal failure superimposed on stage 3 chronic kidney disease ([place]) 05/22/2016  . Anticoagulated on Coumadin   . Symptomatic anemia 07/11/2015  . Microcytic anemia 07/11/2015  . Fatigue 07/11/2015  . Occlusion and stenosis of carotid artery without mention of cerebral infarction-Bilateral 11/11/2013  . Aftercare following surgery of the circulatory  system, [place] 11/11/2013  . Varicose veins of lower extremities with other complications [id number]  . Encounter for therapeutic drug monitoring 05/18/2013  . Fasting hyperglycemia 07/03/2012  . Chronic anticoagulation   . Tobacco abuse, in remission   . Carotid artery occlusion   . Hypertension   . Obesity   . Hyperlipidemia   . Chronic venous insufficiency 07/26/2010  . Chronic atrial fibrillation ([place]) 04/05/2010  . Coronary atherosclerosis of native coronary artery 04/05/2010  . PERIPHERAL VASCULAR DISEASE 04/05/2010    Past Surgical History:  Procedure Laterality Date  . APPENDECTOMY    . CAROTID ENDARTERECTOMY     Right in 2005, left in 2010  . COLONOSCOPY  Never   Declines  . COLONOSCOPY N/A 08/19/2015   Procedure: COLONOSCOPY;  Surgeon: Rogene Houston, MD;  [facility];  Service: Endoscopy;  Laterality: N/A;  7:30  . CORONARY ANGIOPLASTY    . ESOPHAGOGASTRODUODENOSCOPY N/A 08/19/2015   Procedure: ESOPHAGOGASTRODUODENOSCOPY (EGD);  Surgeon: Rogene Houston, MD;  [facility];  Service: Endoscopy;  Laterality: N/A;  . ESOPHAGOGASTRODUODENOSCOPY N/A 05/24/2016   Procedure: ESOPHAGOGASTRODUODENOSCOPY (EGD);  Surgeon: Rogene Houston, MD;  [facility];  Service: Endoscopy;  Laterality: N/A;    OB History    No data available       Home Medications    Prior to Admission medications   Medication Sig Start Date End Date Taking? Authorizing Provider  atorvastatin (LIPITOR) 80 MG tablet TAKE 1 TABLET EVERY NIGHT AT BEDTIME 05/07/16   Satira Sark, MD  bisoprolol (ZEBETA) 10 MG tablet take 1 tablet by mouth twice a day 02/09/16   Satira Sark, MD  digoxin (LANOXIN) 0.125 MG tablet Take 1 tablet (0.125 mg total) by mouth daily. 05/07/16   Satira Sark, MD  losartan (COZAAR) 100 MG tablet Take 0.5 tablets (50 mg total) by mouth daily. 05/25/16   Eber Jones, MD  pantoprazole (PROTONIX) 40 MG tablet Take 1 tablet (40 mg total) by  mouth 2 (two) times daily before a meal. 05/25/16   Eber Jones, MD  Pediatric Multivitamins-Iron (FLINTSTONES PLUS IRON PO) Take by mouth 2 (two) times daily.    Historical Provider, MD  potassium chloride SA (K-DUR,KLOR-CON) 20 MEQ tablet take 1 tablet by mouth once daily 02/09/16   Satira Sark, MD  terazosin (HYTRIN) 5 MG capsule take 1 capsule by mouth at bedtime 09/08/15   Satira Sark, MD  torsemide (DEMADEX) 20 MG tablet Take 4 tablets (80 mg total) in the morning.  Take 2 tablets (40 mg total) in the evening. Patient taking differently: Take 40-80 mg by mouth 2 (two) times daily. Take 4 tablets (80 mg total) in the morning.  Take 2 tablets (40 mg total) in the evening. 06/01/16   Satira Sark, MD  warfarin (COUMADIN) 5 MG tablet Can restart on 05/29/16. Then set up appointment to get INR checked and take as directed by [facility]. 05/25/16   Eber Jones, MD    Family History Family History  Problem Relation Age of Onset  . Lung cancer Mother   . Cancer Mother   . Hypertension Daughter   . Hyperlipidemia Daughter   . Hypertension Son   . Hypertension Daughter   . Heart disease Maternal Grandmother     Social History Social History  Substance Use Topics  . Smoking status: Former Smoker    Types: Cigarettes    Quit date: 04/30/1987  . Smokeless tobacco: Never Used  . Alcohol use No     Allergies   Diltiazem and Verapamil   Review of Systems Review of Systems  Constitutional: Negative for appetite change and fatigue.  HENT: Negative for congestion, ear discharge and sinus pressure.   Eyes: Negative for discharge.  Respiratory: Negative for cough.   Cardiovascular: Negative for chest pain.  Gastrointestinal: Negative for abdominal pain and diarrhea.  Genitourinary: Negative for frequency and hematuria.  Musculoskeletal: Negative for back pain.  Skin: Negative for rash.  Neurological: Positive for dizziness. Negative for seizures and  headaches.  Psychiatric/Behavioral: Negative for hallucinations.     Physical Exam Updated Vital Signs BP (!) 91/36 (BP Location: Left Arm)   Pulse 62   Temp 97.7 F (36.5 C) (Oral)   Resp 14   Ht 5\' 1"  (1.549 m)   Wt 181 lb (82.1 kg)   SpO2 99%   BMI 34.20 kg/m   Physical Exam  Constitutional: She is oriented to person, place, and time. She appears well-developed.  HENT:  Head: Normocephalic.  Eyes: Conjunctivae and EOM are normal. No scleral icterus.  Neck: Neck supple. No thyromegaly present.  Cardiovascular: Exam reveals no gallop and no friction rub.   No murmur heard. Irregular bradycardia  Pulmonary/Chest: No stridor. She has no wheezes. She has no rales. She exhibits no tenderness.  Abdominal: She exhibits no distension. There is no tenderness. There is no rebound.  Genitourinary:  Genitourinary Comments: Dark brown stool heme-positive  Musculoskeletal: Normal range of motion. She exhibits no edema.  Lymphadenopathy:    She has no cervical adenopathy.  Neurological: She is oriented to person, place, and time. She exhibits normal muscle tone. Coordination normal.  Skin: No rash noted. No erythema.  Psychiatric: She has a normal mood and affect. Her behavior is normal.     ED Treatments / Results  Labs (all labs ordered are listed, but only abnormal results are displayed) Labs Reviewed  CBC WITH DIFFERENTIAL/PLATELET - Abnormal; Notable for the following:       Result Value   RBC 2.49 (*)    Hemoglobin 7.1 (*)    HCT 23.3 (*)    RDW 16.8 (*)    All other components within normal limits  COMPREHENSIVE METABOLIC PANEL - Abnormal; Notable for the following:    Glucose, Bld 134 (*)    BUN 32 (*)    Creatinine, Ser 1.72 (*)    Calcium 8.7 (*)    Total Protein 6.2 (*)    Albumin 3.0 (*)    GFR calc non Af Amer 27 (*)    GFR calc Af Amer 31 (*)    All other components within normal limits  TROPONIN I - Abnormal; Notable for the following:    Troponin I  0.17 (*)    All other components within normal limits  DIGOXIN LEVEL - Abnormal; Notable for the following:    Digoxin Level 0.7 (*)    All other components within normal limits  PROTIME-INR - Abnormal; Notable for the following:    Prothrombin Time 34.9 (*)    All other components within normal limits  I-STAT CHEM 8, ED - Abnormal; Notable for the following:    BUN 31 (*)    Creatinine, Ser 1.90 (*)    Glucose, Bld 130 (*)    Calcium, Ion 1.12 (*)    Hemoglobin 7.5 (*)    HCT 22.0 (*)    All other components within normal limits  I-STAT CG4 LACTIC ACID, ED - Abnormal; Notable for the following:    Lactic Acid, Venous 4.73 (*)    All other components within normal limits  POC OCCULT BLOOD, ED - Abnormal; Notable for the following:    Fecal Occult Bld POSITIVE (*)    All other components within normal limits  CULTURE, BLOOD (ROUTINE X 2)  CULTURE, BLOOD (ROUTINE X 2)  URINALYSIS, ROUTINE W REFLEX MICROSCOPIC  TYPE AND SCREEN  PREPARE RBC (CROSSMATCH)    EKG  EKG Interpretation None       Radiology Dg Chest Portable 1 View  Result Date: 06/17/2016 CLINICAL DATA:  Weakness. EXAM: PORTABLE CHEST 1 VIEW COMPARISON:  Radiographs of Sep 02, 2009. FINDINGS: Stable cardiomediastinal silhouette. Atherosclerosis of thoracic aorta is noted. No pneumothorax or pleural effusion is noted. Both lungs are clear. The visualized skeletal structures are unremarkable. IMPRESSION: No acute cardiopulmonary abnormality seen.  Aortic atherosclerosis. Electronically Signed   By: Marijo Conception, M.D.   On: 06/17/2016 15:08    Procedures Procedures (including critical care time)  Medications Ordered in ED Medications  cefTRIAXone (ROCEPHIN) 1 g in dextrose 5 % 50 mL IVPB (not administered)  0.9 %  sodium chloride infusion (not administered)  0.9 %  sodium chloride infusion ( Intravenous Stopped 06/17/16 1457)  sodium chloride 0.9 % bolus 2,000 mL (2,000 mLs Intravenous New Bag/Given 06/17/16 1444)    pantoprazole (PROTONIX) injection 40 mg (40 mg Intravenous Given 06/17/16 1506)  Initial Impression / Assessment and Plan / ED Course  I have reviewed the triage vital signs and the nursing notes.  Pertinent labs & imaging results that were available during my care of the patient were reviewed by me and considered in my medical decision making (see chart for details).    CRITICAL CARE Performed by: Jalee Saine L Total critical care time: 35  minutes Critical care time was exclusive of separately billable procedures and treating other patients. Critical care was necessary to treat or prevent imminent or life-threatening deterioration. Critical care was time spent personally by me on the following activities: development of treatment plan with patient and/or surrogate as well as nursing, discussions with consultants, evaluation of patient's response to treatment, examination of patient, obtaining history from patient or surrogate, ordering and performing treatments and interventions, ordering and review of laboratory studies, ordering and review of radiographic studies, pulse oximetry and re-evaluation of patient's condition.   Patient with a upper GI bleed and anemia. She will be transfused and GI will see patient. Patient also has a non-STEMI and cardiology is being consult.  I spoke to cardiology and it was recommended to reverse her INR. I spoke to her pharmacy and it was decided to give her 10 mg of vitamin K IV Final Clinical Impressions(s) / ED Diagnoses   Final diagnoses:  Upper GI bleed    New Prescriptions New Prescriptions   No medications on file     Milton Ferguson, MD 06/17/16 [place]    Milton Ferguson, MD 06/17/16 1600

## 2016-06-18 ENCOUNTER — Inpatient Hospital Stay (HOSPITAL_COMMUNITY)
Admit: 2016-06-18 | Discharge: 2016-06-18 | Disposition: A | Discharge status: Home / Self Care | Payer: Medicare Other | Attending: Family Medicine | Admitting: Family Medicine

## 2016-06-18 DIAGNOSIS — D500 Iron deficiency anemia secondary to blood loss (chronic): Secondary | ICD-10-CM

## 2016-06-18 DIAGNOSIS — R748 Abnormal levels of other serum enzymes: Secondary | ICD-10-CM | POA: Diagnosis not present

## 2016-06-18 DIAGNOSIS — I482 Chronic atrial fibrillation: Secondary | ICD-10-CM | POA: Diagnosis not present

## 2016-06-18 DIAGNOSIS — I248 Other forms of acute ischemic heart disease: Secondary | ICD-10-CM | POA: Diagnosis present

## 2016-06-18 DIAGNOSIS — I872 Venous insufficiency (chronic) (peripheral): Secondary | ICD-10-CM | POA: Diagnosis present

## 2016-06-18 DIAGNOSIS — R55 Syncope and collapse: Secondary | ICD-10-CM | POA: Diagnosis present

## 2016-06-18 DIAGNOSIS — I5032 Chronic diastolic (congestive) heart failure: Secondary | ICD-10-CM | POA: Diagnosis not present

## 2016-06-18 DIAGNOSIS — K2921 Alcoholic gastritis with bleeding: Secondary | ICD-10-CM | POA: Diagnosis not present

## 2016-06-18 DIAGNOSIS — Z79899 Other long term (current) drug therapy: Secondary | ICD-10-CM | POA: Diagnosis not present

## 2016-06-18 DIAGNOSIS — K449 Diaphragmatic hernia without obstruction or gangrene: Secondary | ICD-10-CM | POA: Diagnosis not present

## 2016-06-18 DIAGNOSIS — D649 Anemia, unspecified: Secondary | ICD-10-CM | POA: Diagnosis not present

## 2016-06-18 DIAGNOSIS — Z87891 Personal history of nicotine dependence: Secondary | ICD-10-CM | POA: Diagnosis not present

## 2016-06-18 DIAGNOSIS — K31811 Angiodysplasia of stomach and duodenum with bleeding: Principal | ICD-10-CM

## 2016-06-18 DIAGNOSIS — R569 Unspecified convulsions: Secondary | ICD-10-CM | POA: Diagnosis present

## 2016-06-18 DIAGNOSIS — I890 Lymphedema, not elsewhere classified: Secondary | ICD-10-CM | POA: Diagnosis present

## 2016-06-18 DIAGNOSIS — N179 Acute kidney failure, unspecified: Secondary | ICD-10-CM | POA: Diagnosis present

## 2016-06-18 DIAGNOSIS — K922 Gastrointestinal hemorrhage, unspecified: Secondary | ICD-10-CM | POA: Diagnosis not present

## 2016-06-18 DIAGNOSIS — Z66 Do not resuscitate: Secondary | ICD-10-CM | POA: Diagnosis present

## 2016-06-18 DIAGNOSIS — I110 Hypertensive heart disease with heart failure: Secondary | ICD-10-CM | POA: Diagnosis present

## 2016-06-18 DIAGNOSIS — Z7901 Long term (current) use of anticoagulants: Secondary | ICD-10-CM | POA: Diagnosis not present

## 2016-06-18 DIAGNOSIS — K2901 Acute gastritis with bleeding: Secondary | ICD-10-CM | POA: Diagnosis not present

## 2016-06-18 DIAGNOSIS — I959 Hypotension, unspecified: Secondary | ICD-10-CM | POA: Diagnosis present

## 2016-06-18 DIAGNOSIS — R42 Dizziness and giddiness: Secondary | ICD-10-CM | POA: Diagnosis not present

## 2016-06-18 DIAGNOSIS — Z955 Presence of coronary angioplasty implant and graft: Secondary | ICD-10-CM | POA: Diagnosis not present

## 2016-06-18 DIAGNOSIS — K31819 Angiodysplasia of stomach and duodenum without bleeding: Secondary | ICD-10-CM | POA: Diagnosis not present

## 2016-06-18 DIAGNOSIS — I2510 Atherosclerotic heart disease of native coronary artery without angina pectoris: Secondary | ICD-10-CM | POA: Diagnosis present

## 2016-06-18 DIAGNOSIS — E785 Hyperlipidemia, unspecified: Secondary | ICD-10-CM | POA: Diagnosis present

## 2016-06-18 LAB — BPAM RBC
BLOOD PRODUCT EXPIRATION DATE: 201803312359
Blood Product Expiration Date: 201803312359
ISSUE DATE / TIME: 201803041552
ISSUE DATE / TIME: 201803041730
Unit Type and Rh: 5100
Unit Type and Rh: 5100

## 2016-06-18 LAB — URINALYSIS, ROUTINE W REFLEX MICROSCOPIC
BILIRUBIN URINE: NEGATIVE
Glucose, UA: NEGATIVE mg/dL
Hgb urine dipstick: NEGATIVE
Ketones, ur: NEGATIVE mg/dL
NITRITE: NEGATIVE
PH: 6 (ref 5.0–8.0)
Protein, ur: NEGATIVE mg/dL
SPECIFIC GRAVITY, URINE: 1.009 (ref 1.005–1.030)

## 2016-06-18 LAB — CBC
HCT: 28.7 % — ABNORMAL LOW (ref 36.0–46.0)
Hemoglobin: 8.9 g/dL — ABNORMAL LOW (ref 12.0–15.0)
MCH: 28 pg (ref 26.0–34.0)
MCHC: 31 g/dL (ref 30.0–36.0)
MCV: 90.3 fL (ref 78.0–100.0)
Platelets: 159 10*3/uL (ref 150–400)
RBC: 3.18 MIL/uL — ABNORMAL LOW (ref 3.87–5.11)
RDW: 17 % — ABNORMAL HIGH (ref 11.5–15.5)
WBC: 7.1 10*3/uL (ref 4.0–10.5)

## 2016-06-18 LAB — COMPREHENSIVE METABOLIC PANEL
ALK PHOS: 77 U/L (ref 38–126)
ALT: 16 U/L (ref 14–54)
ANION GAP: 8 (ref 5–15)
AST: 27 U/L (ref 15–41)
Albumin: 3 g/dL — ABNORMAL LOW (ref 3.5–5.0)
BILIRUBIN TOTAL: 1.4 mg/dL — AB (ref 0.3–1.2)
BUN: 29 mg/dL — ABNORMAL HIGH (ref 6–20)
CALCIUM: 8.7 mg/dL — AB (ref 8.9–10.3)
CO2: 24 mmol/L (ref 22–32)
CREATININE: 1.38 mg/dL — AB (ref 0.44–1.00)
Chloride: 108 mmol/L (ref 101–111)
GFR calc non Af Amer: 35 mL/min — ABNORMAL LOW (ref >60)
GFR, EST AFRICAN AMERICAN: 40 mL/min — AB (ref >60)
Glucose, Bld: 116 mg/dL — ABNORMAL HIGH (ref 65–99)
Potassium: 3.9 mmol/L (ref 3.5–5.1)
SODIUM: 140 mmol/L (ref 135–145)
TOTAL PROTEIN: 6 g/dL — AB (ref 6.5–8.1)

## 2016-06-18 LAB — TROPONIN I
Troponin I: 0.3 ng/mL
Troponin I: 0.36 ng/mL (ref <0.03)

## 2016-06-18 LAB — TYPE AND SCREEN
ABO/RH(D): O POS
Antibody Screen: NEGATIVE
UNIT DIVISION: 0
Unit division: 0

## 2016-06-18 LAB — PROTIME-INR
INR: 1.57
Prothrombin Time: 18.9 seconds — ABNORMAL HIGH (ref 11.4–15.2)

## 2016-06-18 LAB — HEMOGLOBIN AND HEMATOCRIT, BLOOD
HCT: 29 % — ABNORMAL LOW (ref 36.0–46.0)
HEMATOCRIT: 27.5 % — AB (ref 36.0–46.0)
HEMOGLOBIN: 9 g/dL — AB (ref 12.0–15.0)
Hemoglobin: 8.7 g/dL — ABNORMAL LOW (ref 12.0–15.0)

## 2016-06-18 MED ORDER — MUSCLE RUB 10-15 % EX CREA
TOPICAL_CREAM | CUTANEOUS | Status: DC | PRN
  Administered 2016-06-18: 19:00:00 via TOPICAL
  Filled 2016-06-18: qty 85

## 2016-06-18 MED ORDER — LEVETIRACETAM 500 MG PO TABS
500.0000 mg | ORAL_TABLET | Freq: Two times a day (BID) | ORAL | Status: DC
  Administered 2016-06-18 – 2016-06-20 (×5): 500 mg via ORAL
  Filled 2016-06-18 (×5): qty 1

## 2016-06-18 MED ORDER — MUSCLE RUB 10-15 % EX CREA
TOPICAL_CREAM | CUTANEOUS | Status: AC
  Filled 2016-06-18: qty 85

## 2016-06-18 MED ORDER — SODIUM CHLORIDE 0.9 % IV SOLN
Freq: Once | INTRAVENOUS | Status: AC
  Administered 2016-06-18: 18:00:00 via INTRAVENOUS

## 2016-06-18 NOTE — Consult Note (Signed)
Referring Provider: Phillips Climes M.D  Primary Care Physician:  Purvis Kilts, MD Primary Gastroenterologist:  Dr. Laural Golden  Reason for Consultation:    GI bleed and anemia.  HPI:   Patient is 83 year old Caucasian female who has chronic atrial fibrillation requiring anticoagulation history of GI bleed who began to experience postural lightheadedness on 06/14/2016. This symptom gradually got worse and she decided to come to emergency room yesterday afternoon. She was noted to have hemoglobin of 7.1 g. Hemoglobin 4 weeks earlier was 9.3 g. She was noted to have heme-positive stool. Patient states her stools have been black but she has been on iron. She had 2 bowel movements yesterday. She noted some discomfort in right low quadrant but she has no pain today. In emergency room patient developed seizure-like activity while she was resting comfortably in bed but her blood pressure was low according to her daughter. He had unenhanced head CT and was negative for acute abnormalities. She was begun on levetiracetam. Neurologic consultation and EEG are pending. Patient received 10 mg of vitamin K subcutaneous in emergency room. She received 2 units of PRBCs. Her hemoglobin is up to 8.9 g. This morning she feels fine. She denies nausea vomiting heartburn or abdominal pain. She has not had a bowel movement in 24 hours. Her daughter states she does not look pale anymore. Patient does not take aspirin or other OTC NSAIDs.  Her GI history is as follows.  She was hospitalized in March 2017 for symptomatic anemia. She received 3 units of PRBCs. She was confirmed to have iron deficiency anemia. She underwent EGD and colonoscopy on 08/19/2015. EGD revealed gastric ulcer at angularis without stigmata of bleeding. Colonoscopy revealed colonic diverticulosis and 3 small nonbleeding AV malformations at ascending colon. It was felt that she may have been losing blood intermittently from gastric ulcer. H.  pylori serology was positive and she has been treated.  She was readmitted with lightheadedness and anemia on 05/22/2016. Once again she received 3 units of PRBCs. EGD revealed healed gastric ulcer and fundal AV malformation with axial bleeding. This was initially lobulated but had to be clipped for hemostasis. Patient resumed her warfarin on 05/29/2016.    Past Medical History:  Diagnosis Date  . Atrial fibrillation ([place])   . Carotid artery disease ([place])    RIght CEA 05/2004  . Chronic anticoagulation   . Coronary atherosclerosis of native coronary artery    RCA stent in 2003; residual 60% mid Cx and OM2  . Essential hypertension, benign   . Hyperlipidemia   . Obesity   . Renal artery stenosis ([facility])    Bilateral renal artery stents 2004  . Tobacco abuse, in remission    40 pack year stopped in 1989  . Venous insufficiency (chronic) (peripheral)    Pedal edema    Past Surgical History:  Procedure Laterality Date  . APPENDECTOMY    . CAROTID ENDARTERECTOMY     Right in 2005, left in 2010  . COLONOSCOPY  Never   Declines  . COLONOSCOPY N/A 08/19/2015   Procedure: COLONOSCOPY;  Surgeon: Rogene Houston, MD;  [facility];  Service: Endoscopy;  Laterality: N/A;  7:30  . CORONARY ANGIOPLASTY    . ESOPHAGOGASTRODUODENOSCOPY N/A 08/19/2015   Procedure: ESOPHAGOGASTRODUODENOSCOPY (EGD);  Surgeon: Rogene Houston, MD;  [facility];  Service: Endoscopy;  Laterality: N/A;  . ESOPHAGOGASTRODUODENOSCOPY N/A 05/24/2016   Procedure: ESOPHAGOGASTRODUODENOSCOPY (EGD);  Surgeon: Rogene Houston, MD;  [facility];  Service: Endoscopy;  Laterality: N/A;    Prior to Admission medications   Medication Sig Start Date End Date Taking? Authorizing Provider  atorvastatin (LIPITOR) 80 MG tablet TAKE 1 TABLET EVERY NIGHT AT BEDTIME 05/07/16  Yes Satira Sark, MD  bisoprolol (ZEBETA) 10 MG tablet take 1 tablet by mouth twice a day 02/09/16  Yes Satira Sark, MD   digoxin (LANOXIN) 0.125 MG tablet Take 1 tablet (0.125 mg total) by mouth daily. 05/07/16  Yes Satira Sark, MD  losartan (COZAAR) 100 MG tablet Take 0.5 tablets (50 mg total) by mouth daily. 05/25/16  Yes Eber Jones, MD  pantoprazole (PROTONIX) 40 MG tablet Take 1 tablet (40 mg total) by mouth 2 (two) times daily before a meal. 05/25/16  Yes Eber Jones, MD  Pediatric Multivitamins-Iron (FLINTSTONES PLUS IRON PO) Take by mouth 2 (two) times daily.   Yes Historical Provider, MD  potassium chloride SA (K-DUR,KLOR-CON) 20 MEQ tablet take 1 tablet by mouth once daily 02/09/16  Yes Satira Sark, MD  terazosin (HYTRIN) 5 MG capsule take 1 capsule by mouth at bedtime 09/08/15  Yes Satira Sark, MD  torsemide (DEMADEX) 20 MG tablet Take 4 tablets (80 mg total) in the morning.  Take 2 tablets (40 mg total) in the evening. Patient taking differently: Take 40-80 mg by mouth 2 (two) times daily. Take 4 tablets (80 mg total) in the morning.  Take 2 tablets (40 mg total) in the evening. 06/01/16  Yes Satira Sark, MD  warfarin (COUMADIN) 5 MG tablet Can restart on 05/29/16. Then set up appointment to get INR checked and take as directed by [facility]. Patient taking differently: Take 5 mg by mouth daily. Can restart on 05/29/16. Then set up appointment to get INR checked and take as directed by [facility]. 05/25/16  Yes Eber Jones, MD    Current Facility-Administered Medications  Medication Dose Route Frequency Provider Last Rate Last Dose  . acetaminophen (TYLENOL) tablet 650 mg  650 mg Oral Q6H PRN Oswald Hillock, MD       Or  . acetaminophen (TYLENOL) suppository 650 mg  650 mg Rectal Q6H PRN Oswald Hillock, MD      . atorvastatin (LIPITOR) tablet 80 mg  80 mg Oral QHS Oswald Hillock, MD   80 mg at 06/17/16 2309  . digoxin (LANOXIN) tablet 0.125 mg  0.125 mg Oral Daily Oswald Hillock, MD   0.125 mg at 06/18/16 0949  . levETIRAcetam (KEPPRA) tablet 500 mg  500 mg  Oral BID Albertine Patricia, MD   500 mg at 06/18/16 1106  . ondansetron (ZOFRAN) tablet 4 mg  4 mg Oral Q6H PRN Oswald Hillock, MD       Or  . ondansetron (ZOFRAN) injection 4 mg  4 mg Intravenous Q6H PRN Oswald Hillock, MD      . pantoprazole (PROTONIX) injection 40 mg  40 mg Intravenous Q12H Oswald Hillock, MD   40 mg at 06/18/16 0949    Allergies as of 06/17/2016 - Review Complete 06/17/2016  Allergen Reaction Noted  . Diltiazem Other (See Comments) 01/03/2012  . Verapamil Other (See Comments) 01/03/2012    Family History  Problem Relation Age of Onset  . Lung cancer Mother   . Cancer Mother   . Hypertension Daughter   . Hyperlipidemia Daughter   . Hypertension Son   . Hypertension Daughter   . Heart disease Maternal Grandmother  Social History   Social History  . Marital status: Widowed    Spouse name: N/A  . Number of children: 4  . Years of education: N/A   Occupational History  . housewife Retired   Social History Main Topics  . Smoking status: Former Smoker    Types: Cigarettes    Quit date: 04/30/1987  . Smokeless tobacco: Never Used  . Alcohol use No  . Drug use: No  . Sexual activity: Not on file   Other Topics Concern  . Not on file   Social History Narrative  . No narrative on file    Review of Systems: See HPI, otherwise normal ROS  Physical Exam: Temp:  [97.4 F (36.3 C)-97.8 F (36.6 C)] 97.8 F (36.6 C) (03/05 0800) Pulse Rate:  [43-92] 75 (03/05 0949) Resp:  [14-21] 18 (03/05 0900) BP: (82-131)/(29-64) 131/47 (03/05 0900) SpO2:  [91 %-99 %] 92 % (03/05 0900) Weight:  [181 lb (82.1 kg)-182 lb 8.7 oz (82.8 kg)] 182 lb 8.7 oz (82.8 kg) (03/05 0500) Last BM Date: 06/17/16  Patient is alert and in no acute distress. She does not appear pale. Conjunctiva was pink and sclerae nonicteric. Oropharyngeal mucosa is normal. She has upper and lower dentures in place. No neck masses or thyromegaly noted. Cardiac exam with regular rhythm normal  S1 and S2. No murmur or gallop noted. Lungs are clear to auscultation. Abdomen is full. Bowel sounds are normal. On palpation abdomen is soft and nontender without organomegaly or masses. She has 1+ pitting edema involving both legs. There is wrinkling skins suggesting decrease in edema.   Lab Results:  Recent Labs  06/17/16 1424 06/17/16 1452 06/18/16 0022 06/18/16 0611  WBC 6.8  --   --  7.1  HGB 7.1* 7.5* 8.7* 8.9*  HCT 23.3* 22.0* 27.5* 28.7*  PLT 166  --   --  159   BMET  Recent Labs  06/17/16 1424 06/17/16 1452 06/18/16 0611  NA 137 142 140  K 3.9 3.8 3.9  CL 103 103 108  CO2 23  --  24  GLUCOSE 134* 130* 116*  BUN 32* 31* 29*  CREATININE 1.72* 1.90* 1.38*  CALCIUM 8.7*  --  8.7*   LFT  Recent Labs  06/18/16 0611  PROT 6.0*  ALBUMIN 3.0*  AST 27  ALT 16  ALKPHOS 77  BILITOT 1.4*   PT/INR  Recent Labs  06/17/16 1424 06/18/16 1025  LABPROT 34.9* 18.9*  INR 3.37 1.57      Assessment;  Patient is 83 year old Caucasian female who presents with recurrent anemia and GI bleed. She has history of gastric ulcer (May 2017) documented to have healed on EGD 4 weeks ago which revealed active bleeding from gastric fundal AV malformation treated with combination therapy with hemostasis. Patient is anticoagulated because of chronic atrial fibrillation. Asian has received 3 units of PRBCs during this admission and now is hemodynamically stable. She could be retrieved for bleeding from gastric or small bowel AV malformations. Last colonoscopy was in May 2017 revealing colonic diverticulosis and 3 small right-sided AV malformations without stigmata of bleeding. Patient will be further evaluated with EGD and given capsule study if indicated once she is deemed to be stable from neurologic standpoint. Anticoagulant is on hold.  Mildly elevated troponin levels felt to be secondary to anemia and hypotension.  Seizure like activity documented in emergency room.  Unenhanced head CT negative.-year-old consultation is pending.   Recommendations;  Hold off EGD until new evaluation completed.  Patient is not actively bleeding and there is no urgency in proceeding with EGD. Into each hold warfarin for now. Esophagogastroduodenoscopy within the next 24-48 hours. Patient begun on heart healthy diet.    LOS: 0 days   Phelan Goers  06/18/2016, 11:28 AM

## 2016-06-18 NOTE — Progress Notes (Signed)
EEG Completed; Results Pending  

## 2016-06-18 NOTE — Consult Note (Signed)
Primary cardiologist: Dr Rozann Lesches Consulting cardiologist: Dr Carlyle Dolly Reuqesting physician: Dr Marvel Plan Indication: elevated troponin  Clinical Summary Ms. Naeve is a 83 y.o.female with history of anemia, gastric AVMs, afib on coumadin, lymphedema, bilateral renal artery stenosis with prior stents,CAD with prior stent to RCA in 2003 and moderate residual LCX and OM disease managed medically admitted with dizzienss and fatigue. In ER reported seizure like activity. Cardiology is consulted for elevated troponin. She denies any chest pain, no SOB or DOE.     ER vitals p 43 bp 91/36 99% RA K 3.9 Cr 1.72 (baseline 1.3) BUN 32 Diigoxin 0.7 INR 3.37 Hgb 7.1 Plt 166 lactic acid 4.73  Trop 0.17-->0.15-->0.30-->0.36 CXR no acute process CT head no acute process 06/2015 echo LVEF 60-65%, mild MR, mod TR EKG afib, inferior and lateral precordial ST depressions chronic Cath 2003: IMPRESSIONS: 1. Normal left ventricular systolic function. 2. Bilateral renal artery stenosis, greater on the left than the right. 3. Two-vessel coronary artery disease as described. The culprit lesion    appears to be 99% stenosed in the ostium of the right coronary artery.    There is moderate disease in the left circumflex, which appears to be    of borderline severity as described.   Allergies  Allergen Reactions  . Diltiazem Other (See Comments)    Edema  . Verapamil Other (See Comments)    Diarrhea    Medications Scheduled Medications: . atorvastatin  80 mg Oral QHS  . digoxin  0.125 mg Oral Daily  . pantoprazole (PROTONIX) IV  40 mg Intravenous Q12H     Infusions:   PRN Medications:  acetaminophen **OR** acetaminophen, ondansetron **OR** ondansetron (ZOFRAN) IV   Past Medical History:  Diagnosis Date  . Atrial fibrillation ([place])   . Carotid artery disease ([place])    RIght CEA 05/2004  . Chronic anticoagulation   . Coronary atherosclerosis of native coronary artery    RCA  stent in 2003; residual 60% mid Cx and OM2  . Essential hypertension, benign   . Hyperlipidemia   . Obesity   . Renal artery stenosis ([facility])    Bilateral renal artery stents 2004  . Tobacco abuse, in remission    40 pack year stopped in 1989  . Venous insufficiency (chronic) (peripheral)    Pedal edema    Past Surgical History:  Procedure Laterality Date  . APPENDECTOMY    . CAROTID ENDARTERECTOMY     Right in 2005, left in 2010  . COLONOSCOPY  Never   Declines  . COLONOSCOPY N/A 08/19/2015   Procedure: COLONOSCOPY;  Surgeon: Rogene Houston, MD;  [facility];  Service: Endoscopy;  Laterality: N/A;  7:30  . CORONARY ANGIOPLASTY    . ESOPHAGOGASTRODUODENOSCOPY N/A 08/19/2015   Procedure: ESOPHAGOGASTRODUODENOSCOPY (EGD);  Surgeon: Rogene Houston, MD;  [facility];  Service: Endoscopy;  Laterality: N/A;  . ESOPHAGOGASTRODUODENOSCOPY N/A 05/24/2016   Procedure: ESOPHAGOGASTRODUODENOSCOPY (EGD);  Surgeon: Rogene Houston, MD;  [facility];  Service: Endoscopy;  Laterality: N/A;    Family History  Problem Relation Age of Onset  . Lung cancer Mother   . Cancer Mother   . Hypertension Daughter   . Hyperlipidemia Daughter   . Hypertension Son   . Hypertension Daughter   . Heart disease Maternal Grandmother     Social History Ms. Gorka reports that she quit smoking about 29 years ago. Her smoking use included Cigarettes. She has never used smokeless tobacco.  Ms. Fornash reports that she does not drink alcohol.  Review of Systems CONSTITUTIONAL: + fatigue HEENT: Eyes: No visual loss, blurred vision, double vision or yellow sclerae. No hearing loss, sneezing, congestion, runny nose or sore throat.  SKIN: No rash or itching.  CARDIOVASCULAR: per HPI  RESPIRATORY: No shortness of breath, cough or sputum.  GASTROINTESTINAL: No anorexia, nausea, vomiting or diarrhea. No abdominal pain or blood.  GENITOURINARY: no polyuria, no dysuria NEUROLOGICAL:  No headache, dizziness, syncope, paralysis, ataxia, numbness or tingling in the extremities. No change in bowel or bladder control.  MUSCULOSKELETAL: No muscle, back pain, joint pain or stiffness.  HEMATOLOGIC: No anemia, bleeding or bruising.  LYMPHATICS: No enlarged nodes. No history of splenectomy.  PSYCHIATRIC: No history of depression or anxiety.      Physical Examination Blood pressure (!) 104/43, pulse 69, temperature 97.4 F (36.3 C), temperature source Oral, resp. rate 19, height 5\' 1"  (1.549 m), weight 182 lb 8.7 oz (82.8 kg), SpO2 99 %.  Intake/Output Summary (Last 24 hours) at 06/18/16 0828 Last data filed at 06/17/16 1830  Gross per 24 hour  Intake           771.25 ml  Output              400 ml  Net           371.25 ml    HEENT sclera clear, throat clear  Cardiovascular: irreg, no m/r/g  Respiratory: CTAB  GI: abdomen soft, NT, ND  MSK: 1-2+ bilateral LE edema  Neuro: no focal deficits  Psych: appropriate affect   Lab Results  Basic Metabolic Panel:  Recent Labs Lab 06/17/16 1424 06/17/16 1452 06/18/16 0611  NA 137 142 140  K 3.9 3.8 3.9  CL 103 103 108  CO2 23  --  24  GLUCOSE 134* 130* 116*  BUN 32* 31* 29*  CREATININE 1.72* 1.90* 1.38*  CALCIUM 8.7*  --  8.7*    Liver Function Tests:  Recent Labs Lab 06/17/16 1424 06/18/16 0611  AST 34 27  ALT 16 16  ALKPHOS 77 77  BILITOT 0.8 1.4*  PROT 6.2* 6.0*  ALBUMIN 3.0* 3.0*    CBC:  Recent Labs Lab 06/17/16 1424 06/17/16 1452 06/18/16 0022 06/18/16 0611  WBC 6.8  --   --  7.1  NEUTROABS 5.2  --   --   --   HGB 7.1* 7.5* 8.7* 8.9*  HCT 23.3* 22.0* 27.5* 28.7*  MCV 93.6  --   --  90.3  PLT 166  --   --  159    Cardiac Enzymes:  Recent Labs Lab 06/17/16 1424 06/17/16 1951 06/18/16 0022 06/18/16 0611  TROPONINI 0.17* 0.15* 0.30* 0.36*    BNP: Invalid input(s): POCBNP   ECG   Imaging   Impression/Recommendations 1. Symptomatic anemia - management per  primary team. Patient has received transfusion. Coumadin is on hold, INR pending today. Received vitamin K yesterday  2. Elevated troponin - elevated troponin in setting of severe anemia and hypotension.  - EKG without acute ischemic changes, has chronic ST/T changes. She has had no cardiopulmonary symptoms.  - at this time do not suspect ACS, likely related to severe anemia and hypotension in the setting of chronic CAD as opposed to acute obstructive CAD.  - with her recurrent GI bleeds would be a poor cath candidate - no plans for ischemic testing at this time.   3. Afib - beta blocker on hold, remains on digoxin oral -  coumadin on hold given recurrent GI bleed - bp's improving, likely can restart beta blocker later today or tomorrow  4. AKI - improving with transfusion and fluids  5. Chronic diastolic HF - clinic picture is clouded by her lymphedema. Admission labs and hypotension would support she is dry - hold diuretics. In general her LE edema appears to be a poor indicator of her total fluid status.   Carlyle Dolly, M.D.

## 2016-06-18 NOTE — Progress Notes (Signed)
PROGRESS NOTE                                                                                                                                                                                                             Patient Demographics:    [name], is a 83 y.o. female, DOB - 02/27/1933, [id number]  Admit date - 06/17/2016   Admitting Physician Oswald Hillock, MD  Outpatient Primary MD for the patient is Purvis Kilts, MD  LOS - 0  Outpatient Specialists: Cardiology Dr. [name], GI Dr Laural Golden  Chief Complaint  Patient presents with  . Dizziness       Brief Narrative   83 y.o. female, With history of GI bleed secondary to gastric AV malformation, status post endoscopic therapy for active bleeding AVM in February 2018, atrial fibrillation, on chronic anticoagulation with Coumadin, coronary atherosclerosis, hypertension who came to hospital with chief complaint of dizziness And generalized weakness, workup significant for anemia with hemoglobin of 7.1, hypotension and elevated troponins, as well as one episode of seizures noted in ED   Subjective:    [name] today has, No headache, No chest pain, No abdominal pain - No Nausea, Report she is feeling better today   Assessment  & Plan :    Active Problems:   Chronic atrial fibrillation (HCC)   Chronic anticoagulation   Symptomatic anemia   Chronic blood loss anemia   Chronic diastolic CHF (congestive heart failure) (HCC)   GI bleed   Symptomatic anemia - Secondary to chronic upper GI bleed from known AVM, glycohemoglobin 7.1 on admission, she received 2 units PRBC, hemoglobin is 8.9 this a.m. continue to monitor H&H closely and transfuse as needed.  Upper GI bleed - Hemoccult positive stool, but no evidence of overt bleeding,  patient underwent EGD last month and was found to have gastric AVM, which was coagulated. Continue with Protonix 40 mg IV every 12 hours. GI has  been consulted .   Seizures - Patient  with witnessed episodes of seizures in ED, unclear of breath. Hypertension, will follow on EEG, will start on Keppra 500 mg twice a day  Atrial fibrillation - Heart rate is currently controlled, continue with digoxin, If her blood pressure remained stable will resume back on beta blockers  LATER  today or tomorrow per cardiology recommendation -  Continue  to hold warfarin  Chronic diastolic CHF - Patient  chronic lower extremity lymphedema, hypotensive on presentation, continue to hold diuretics and monitor closely for volume overload  Elevated troponins - He denies any chest pain or shortness of breath, most likely demand ischemia in the setting of anemia and hypotension.  AKI - Improving  with IV fluids and blood products  Code Status : DNR  Family Communication  : Daughter at bedside  Disposition Plan  : Pending further workup  Consults  :  GI, cardiology  Procedures  : None  DVT Prophylaxis  :   SCDs   Lab Results  Component Value Date   PLT 159 06/18/2016    Antibiotics  :    Anti-infectives    Start     Dose/Rate Route Frequency Ordered Stop   06/17/16 1515  cefTRIAXone (ROCEPHIN) 1 g in dextrose 5 % 50 mL IVPB     1 g Intravenous  Once 06/17/16 1508 06/17/16 1530        Objective:   Vitals:   06/18/16 0500 06/18/16 0800 06/18/16 0900 06/18/16 0949  BP:  (!) 125/54 (!) 131/47   Pulse:  74 69 75  Resp:  18 18   Temp:  97.8 F (36.6 C)    TempSrc:  Oral    SpO2:  91% 92%   Weight: 82.8 kg (182 lb 8.7 oz)     Height:        Wt Readings from Last 3 Encounters:  06/18/16 82.8 kg (182 lb 8.7 oz)  06/01/16 82.1 kg (181 lb)  05/25/16 83.6 kg (184 lb 6.4 oz)     Intake/Output Summary (Last 24 hours) at 06/18/16 0950 Last data filed at 06/17/16 1830  Gross per 24 hour  Intake           771.25 ml  Output              400 ml  Net           371.25 ml     Physical Exam  Awake Alert, Oriented X 3, No new F.N  deficits, Normal affect Supple Neck,No JVD,   Symmetrical Chest wall movement, Good air movement bilaterally, CTAB Irreg,No Gallops,Rubs or new Murmurs, No Parasternal Heave +ve B.Sounds, Abd Soft, No tenderness, No rebound - guarding or rigidity. No Cyanosis, Clubbing , No new Rash or bruise , chronic bilateral lower extremity lymphedema    Data Review:    CBC  Recent Labs Lab 06/17/16 1424 06/17/16 1452 06/18/16 0022 06/18/16 0611  WBC 6.8  --   --  7.1  HGB 7.1* 7.5* 8.7* 8.9*  HCT 23.3* 22.0* 27.5* 28.7*  PLT 166  --   --  159  MCV 93.6  --   --  90.3  MCH 28.5  --   --  28.0  MCHC 30.5  --   --  31.0  RDW 16.8*  --   --  17.0*  LYMPHSABS 0.7  --   --   --   MONOABS 0.8  --   --   --   EOSABS 0.1  --   --   --   BASOSABS 0.0  --   --   --     Chemistries   Recent Labs Lab 06/17/16 1424 06/17/16 1452 06/18/16 0611  NA 137 142 140  K 3.9 3.8 3.9  CL 103 103 108  CO2 23  --  24  GLUCOSE 134* 130* 116*  BUN 32* 31* 29*  CREATININE 1.72* 1.90* 1.38*  CALCIUM 8.7*  --  8.7*  AST 34  --  27  ALT 16  --  16  ALKPHOS 77  --  77  BILITOT 0.8  --  1.4*   ------------------------------------------------------------------------------------------------------------------ No results for input(s): CHOL, HDL, LDLCALC, TRIG, CHOLHDL, LDLDIRECT in the last 72 hours.  Lab Results  Component Value Date   HGBA1C 6.0 (H) 10/02/2012   ------------------------------------------------------------------------------------------------------------------ No results for input(s): TSH, T4TOTAL, T3FREE, THYROIDAB in the last 72 hours.  Invalid input(s): FREET3 ------------------------------------------------------------------------------------------------------------------ No results for input(s): VITAMINB12, FOLATE, FERRITIN, TIBC, IRON, RETICCTPCT in the last 72 hours.  Coagulation profile  Recent Labs Lab 06/17/16 1424  INR 3.37    No results for input(s): DDIMER in the  last 72 hours.  Cardiac Enzymes  Recent Labs Lab 06/17/16 1951 06/18/16 0022 06/18/16 0611  TROPONINI 0.15* 0.30* 0.36*   ------------------------------------------------------------------------------------------------------------------    Component Value Date/Time   BNP 232.7 (H) 08/30/2011 1141    Inpatient Medications  Scheduled Meds: . atorvastatin  80 mg Oral QHS  . digoxin  0.125 mg Oral Daily  . pantoprazole (PROTONIX) IV  40 mg Intravenous Q12H   Continuous Infusions: PRN Meds:.acetaminophen **OR** acetaminophen, ondansetron **OR** ondansetron (ZOFRAN) IV  Micro Results Recent Results (from the past 240 hour(s))  MRSA PCR Screening     Status: None   Collection Time: 06/17/16  6:29 PM  Result Value Ref Range Status   MRSA by PCR NEGATIVE NEGATIVE Final    Comment:        The GeneXpert MRSA Assay (FDA approved for NASAL specimens only), is one component of a comprehensive MRSA colonization surveillance program. It is not intended to diagnose MRSA infection nor to guide or monitor treatment for MRSA infections.     Radiology Reports Ct Head Wo Contrast  Result Date: 06/17/2016 CLINICAL DATA:  Patient complains of feeling weak and dizzy for 4 days. One month ago she had bleeding from AVM in her stomach and had to be transfused. Hemoglobin very low, receiving transfusion right now/ had seizure today/ no hx of seizures. EXAM: CT HEAD WITHOUT CONTRAST TECHNIQUE: Contiguous axial images were obtained from the base of the skull through the vertex without intravenous contrast. COMPARISON:  None. FINDINGS: Brain: Mild low density in the periventricular white matter likely related to small vessel disease. No mass lesion, hemorrhage, hydrocephalus, acute infarct, intra-axial, or extra-axial fluid collection. Expected cerebral volume loss for age. Vascular: Carotid and vertebral artery calcified atherosclerosis. Skull: Normal Sinuses/Orbits: Normal orbits and globes. Clear  paranasal sinuses and mastoid air cells. Cerumen in both external ear canals. Other: None IMPRESSION: 1.  No acute intracranial abnormality. 2. Mild small vessel ischemic change. Electronically Signed   By: Abigail Miyamoto M.D.   On: 06/17/2016 16:53   Dg Chest Portable 1 View  Result Date: 06/17/2016 CLINICAL DATA:  Weakness. EXAM: PORTABLE CHEST 1 VIEW COMPARISON:  Radiographs of Sep 02, 2009. FINDINGS: Stable cardiomediastinal silhouette. Atherosclerosis of thoracic aorta is noted. No pneumothorax or pleural effusion is noted. Both lungs are clear. The visualized skeletal structures are unremarkable. IMPRESSION: No acute cardiopulmonary abnormality seen.  Aortic atherosclerosis. Electronically Signed   By: Marijo Conception, M.D.   On: 06/17/2016 15:08      Panagiotis Oelkers M.D on 06/18/2016 at 9:50 AM  Between 7am to 7pm - Pager - [phone]  After 7pm go to [email or web address] - password [facility]  [facility] -  Office  [phone]

## 2016-06-18 NOTE — Progress Notes (Signed)
Notified MD of pt's B/P 99/37 MAP 57, 97/37 MAP 53 re-check verbal order received for 540ml NS over 5 hours, readback and verified.

## 2016-06-19 ENCOUNTER — Encounter (HOSPITAL_COMMUNITY)
Admission: EM | Disposition: A | Discharge status: Home / Self Care | Payer: Self-pay | Source: Home / Self Care | Attending: Internal Medicine

## 2016-06-19 ENCOUNTER — Encounter (HOSPITAL_COMMUNITY): Payer: Self-pay | Admitting: *Deleted

## 2016-06-19 DIAGNOSIS — K449 Diaphragmatic hernia without obstruction or gangrene: Secondary | ICD-10-CM

## 2016-06-19 DIAGNOSIS — K31819 Angiodysplasia of stomach and duodenum without bleeding: Secondary | ICD-10-CM

## 2016-06-19 HISTORY — PX: ESOPHAGOGASTRODUODENOSCOPY: SHX5428

## 2016-06-19 LAB — BASIC METABOLIC PANEL
ANION GAP: 8 (ref 5–15)
BUN: 24 mg/dL — AB (ref 6–20)
CALCIUM: 8.5 mg/dL — AB (ref 8.9–10.3)
CO2: 24 mmol/L (ref 22–32)
Chloride: 105 mmol/L (ref 101–111)
Creatinine, Ser: 1.15 mg/dL — ABNORMAL HIGH (ref 0.44–1.00)
GFR calc Af Amer: 50 mL/min — ABNORMAL LOW (ref >60)
GFR, EST NON AFRICAN AMERICAN: 43 mL/min — AB (ref >60)
GLUCOSE: 106 mg/dL — AB (ref 65–99)
Potassium: 3.7 mmol/L (ref 3.5–5.1)
SODIUM: 137 mmol/L (ref 135–145)

## 2016-06-19 LAB — CBC
HCT: 27.7 % — ABNORMAL LOW (ref 36.0–46.0)
Hemoglobin: 8.6 g/dL — ABNORMAL LOW (ref 12.0–15.0)
MCH: 28.3 pg (ref 26.0–34.0)
MCHC: 31 g/dL (ref 30.0–36.0)
MCV: 91.1 fL (ref 78.0–100.0)
PLATELETS: 143 10*3/uL — AB (ref 150–400)
RBC: 3.04 MIL/uL — ABNORMAL LOW (ref 3.87–5.11)
RDW: 17 % — AB (ref 11.5–15.5)
WBC: 9 10*3/uL (ref 4.0–10.5)

## 2016-06-19 LAB — HEMOGLOBIN AND HEMATOCRIT, BLOOD
HCT: 26.9 % — ABNORMAL LOW (ref 36.0–46.0)
Hemoglobin: 8.4 g/dL — ABNORMAL LOW (ref 12.0–15.0)

## 2016-06-19 LAB — PROTIME-INR
INR: 1.46
PROTHROMBIN TIME: 17.9 s — AB (ref 11.4–15.2)

## 2016-06-19 SURGERY — EGD (ESOPHAGOGASTRODUODENOSCOPY)
Anesthesia: Moderate Sedation

## 2016-06-19 MED ORDER — SODIUM CHLORIDE 0.9 % IV SOLN
INTRAVENOUS | Status: DC
  Administered 2016-06-19: 13:00:00 via INTRAVENOUS

## 2016-06-19 MED ORDER — LIDOCAINE VISCOUS 2 % MT SOLN
OROMUCOSAL | Status: AC
  Filled 2016-06-19: qty 15

## 2016-06-19 MED ORDER — STERILE WATER FOR IRRIGATION IR SOLN
Status: DC | PRN
  Administered 2016-06-19: 100 mL

## 2016-06-19 MED ORDER — MIDAZOLAM HCL 5 MG/5ML IJ SOLN
INTRAMUSCULAR | Status: AC
  Filled 2016-06-19: qty 10

## 2016-06-19 MED ORDER — SIMETHICONE 40 MG/0.6ML PO SUSP
40.0000 mg | Freq: Once | ORAL | Status: AC
Start: 2016-06-19 — End: 2016-06-19
  Administered 2016-06-19: 40 mg via ORAL
  Filled 2016-06-19: qty 0.6

## 2016-06-19 MED ORDER — MIDAZOLAM HCL 5 MG/5ML IJ SOLN
INTRAMUSCULAR | Status: DC | PRN
  Administered 2016-06-19: 2 mg via INTRAVENOUS
  Administered 2016-06-19: 1 mg via INTRAVENOUS

## 2016-06-19 MED ORDER — SIMETHICONE 40 MG/0.6ML PO SUSP
ORAL | Status: AC
  Administered 2016-06-19: 40 mg via ORAL
  Filled 2016-06-19: qty 30

## 2016-06-19 MED ORDER — MEPERIDINE HCL 50 MG/ML IJ SOLN
INTRAMUSCULAR | Status: DC | PRN
  Administered 2016-06-19 (×2): 25 mg via INTRAVENOUS

## 2016-06-19 MED ORDER — BUTAMBEN-TETRACAINE-BENZOCAINE 2-2-14 % EX AERO
INHALATION_SPRAY | CUTANEOUS | Status: AC
  Filled 2016-06-19: qty 20

## 2016-06-19 MED ORDER — MEPERIDINE HCL 50 MG/ML IJ SOLN
INTRAMUSCULAR | Status: AC
  Filled 2016-06-19: qty 1

## 2016-06-19 NOTE — Op Note (Signed)
[facility] Patient Name: [name] Procedure Date: 06/19/2016 12:54 PM MRN: [id number] Date of Birth: 02-16-1933 Attending MD: Hildred Laser , MD CSN: [id number] Age: 83 Admit Type: Inpatient Procedure:                Upper GI endoscopy Indications:              Iron deficiency anemia secondary to chronic blood                            loss, Melena Providers:                Hildred Laser, MD, Tammy Vaught, RN, Purcell Nails.                            [place], Technician Referring MD:             Albertine Patricia, MD Medicines:                Viscous Lidocaine for topical pharyngeal                            anesthesia. Meperidine 50 mg IV, Midazolam 3 mg IV Complications:            No immediate complications. Estimated Blood Loss:     Estimated blood loss: none. Procedure:                Pre-Anesthesia Assessment:                           - Prior to the procedure, a History and Physical                            was performed, and patient medications and                            allergies were reviewed. The patient's tolerance of                            previous anesthesia was also reviewed. The risks                            and benefits of the procedure and the sedation                            options and risks were discussed with the patient.                            All questions were answered, and informed consent                            was obtained. Prior Anticoagulants: The patient                            last took Coumadin (warfarin) 2 days prior to the  procedure. ASA Grade Assessment: III - A patient                            with severe systemic disease. After reviewing the                            risks and benefits, the patient was deemed in                            satisfactory condition to undergo the procedure.                           After obtaining informed consent, the endoscope was            passed under direct vision. Throughout the                            procedure, the patient's blood pressure, pulse, and                            oxygen saturations were monitored continuously. The                            [id number] scope was introduced through the                            mouth, and advanced to the second part of duodenum.                            The upper GI endoscopy was accomplished without                            difficulty. The patient tolerated the procedure                            well. Scope In: 1:05:05 PM Scope Out: 1:13:23 PM Total Procedure Duration: 0 hours 8 minutes 18 seconds  Findings:      The examined esophagus was normal.      A 2 cm hiatal hernia was present.      Five 2 to 5 mm no bleeding angiodysplastic lesions were found in the       gastric body and in the gastric antrum. Coagulation for bleeding       prevention using argon beam was successful.      clip noted at gastric fundus from prior procedure.      The duodenal bulb and second portion of the duodenum were normal. Impression:               - Normal esophagus.                           - 2 cm hiatal hernia.                           - Five non-bleeding angiodysplastic lesions in the  stomach. Treated with argon beam coagulation.                           - Normal duodenal bulb and second portion of the                            duodenum.                           - No specimens collected. Moderate Sedation:      Moderate (conscious) sedation was administered by the endoscopy nurse       and supervised by the endoscopist. The following parameters were       monitored: oxygen saturation, heart rate, blood pressure, CO2       capnography and response to care. Total physician intraservice time was       12 minutes. Recommendation:           - Return patient to ICU for ongoing care.                           - Cardiac diet today.                            - Continue present medications.                           - small bowel Given capsule study in a.m. Procedure Code(s):        --- Professional ---                           [phone], Esophagogastroduodenoscopy, flexible,                            transoral; with control of bleeding, any method                           99152, Moderate sedation services provided by the                            same physician or other qualified health care                            professional performing the diagnostic or                            therapeutic service that the sedation supports,                            requiring the presence of an independent trained                            observer to assist in the monitoring of the                            patient's level of consciousness and physiological  status; initial 15 minutes of intraservice time,                            patient age 59 years or older Diagnosis Code(s):        --- Professional ---                           K44.9, Diaphragmatic hernia without obstruction or                            gangrene                           K31.819, Angiodysplasia of stomach and duodenum                            without bleeding                           D50.0, Iron deficiency anemia secondary to blood                            loss (chronic)                           K92.1, Melena (includes Hematochezia) CPT copyright 2016 [employer]. All rights reserved. The codes documented in this report are preliminary and upon coder review may  be revised to meet current compliance requirements. Hildred Laser, MD Hildred Laser, MD 06/19/2016 1:31:38 PM This report has been signed electronically. Number of Addenda: 0

## 2016-06-19 NOTE — Progress Notes (Signed)
Brief EGD note: Small sliding hiatal hernia. 5 gastric AV malformations but none with stigmata of bleed. All of these were ablated with APC. Clip noted at gastric fundus site of previous AV malformation.  Recommendations: Small bowel given capsule study tomorrow.

## 2016-06-19 NOTE — Plan of Care (Signed)
Problem: Skin Integrity: Goal: Risk for impaired skin integrity will decrease Outcome: Progressing  Patient able to turn self and move from chair to bed.

## 2016-06-19 NOTE — Progress Notes (Signed)
PROGRESS NOTE                                                                                                                                                                                                             Patient Demographics:    [name], is a 83 y.o. female, DOB - 03-25-1933, [id number]  Admit date - 06/17/2016   Admitting Physician Oswald Hillock, MD  Outpatient Primary MD for the patient is Purvis Kilts, MD  LOS - 1  Outpatient Specialists: Cardiology Dr. [name], GI Dr Laural Golden  Chief Complaint  Patient presents with  . Dizziness       Brief Narrative   83 y.o. female, With history of GI bleed secondary to gastric AV malformation, status post endoscopic therapy for active bleeding AVM in February 2018, atrial fibrillation, on chronic anticoagulation with Coumadin, coronary atherosclerosis, hypertension who came to hospital with chief complaint of dizziness And generalized weakness, workup significant for anemia with hemoglobin of 7.1, hypotension and elevated troponins, as well as one episode of Loss of consciousness with jerking noted in ED.    Subjective:    [name] today has, No headache, No chest pain, No abdominal pain - No Nausea, One bowel movement overnight, denies any bright blood or melena, it was dark in color but soft .   Assessment  & Plan :    Active Problems:   Chronic atrial fibrillation (HCC)   Chronic anticoagulation   Symptomatic anemia   Chronic blood loss anemia   Chronic diastolic CHF (congestive heart failure) (HCC)   GI bleed   Symptomatic anemia - Secondary to chronic upper GI bleed from known AVM, hemoglobin 7.1 on admission, she received 2 units PRBC, hemoglobin is 8.6 today, remained stable,continue to monitor H&H closely and transfuse as needed.  Upper GI bleed - Hemoccult positive stool, but no evidence of overt bleeding,  patient underwent EGD last month and was  found to have gastric AVM, which was coagulated. Continue with Protonix 40 mg IV every 12 hours. GI has been consulted , plan for EGD today, meanwhile continue warfarin.  Syncope versus Seizures - Patient  with witnessed episodes of unresponsiveness in ED, daughter describe it as a responsive, generalized stiffness, with upper extremity shaking , but no postictal symptoms, unclear if related to seizure versus syncope especially systolic  blood pressure in the 60s during that episode , EEG done yesterday, discussed with neuro, it will be read by end of the day , of no evidence of seizures on EEG, then will stop Keppra(given event is not clear of seizure versus syncope, as well it would be her first episode ).  Atrial fibrillation - Heart rate is currently controlled, continue with digoxin, Emend was soft blood pressure, so beta blockers still on hold . - Continue  to hold warfarin  Chronic diastolic CHF - Patient  chronic lower extremity lymphedema, hypotensive on presentation, continue to hold diuretics and monitor closely for volume overload  Elevated troponins - He denies any chest pain or shortness of breath, most likely demand ischemia in the setting of anemia and hypotension.  AKI - Improving  with IV fluids and blood products  Code Status : DNR  Family Communication  : none at bedside  Disposition Plan  : Pending further workup, will transfer to telemetry  Consults  :  GI, cardiology  Procedures  : None  DVT Prophylaxis  :   SCDs   Lab Results  Component Value Date   PLT 143 (L) 06/19/2016    Antibiotics  :    Anti-infectives    Start     Dose/Rate Route Frequency Ordered Stop   06/17/16 1515  cefTRIAXone (ROCEPHIN) 1 g in dextrose 5 % 50 mL IVPB     1 g Intravenous  Once 06/17/16 1508 06/17/16 1530        Objective:   Vitals:   06/19/16 0801 06/19/16 0900 06/19/16 1000 06/19/16 1100  BP: (!) 103/48 (!) 107/43 (!) 111/54 (!) 112/47  Pulse:  (!) 47 (!) 57 67    Resp: 18 19 13  (!) 21  Temp: 97.6 F (36.4 C)     TempSrc: Oral     SpO2: 93% (!) 89% 94% 93%  Weight:      Height:        Wt Readings from Last 3 Encounters:  06/19/16 88.3 kg (194 lb 10.7 oz)  06/01/16 82.1 kg (181 lb)  05/25/16 83.6 kg (184 lb 6.4 oz)     Intake/Output Summary (Last 24 hours) at 06/19/16 1110 Last data filed at 06/19/16 0809  Gross per 24 hour  Intake          1151.67 ml  Output              450 ml  Net           701.67 ml     Physical Exam  Awake Alert, Oriented X 3, No new F.N deficits, Normal affect Supple Neck,No JVD,   Symmetrical Chest wall movement, Good air movement bilaterally, CTAB Irreg,No Gallops,Rubs or new Murmurs, No Parasternal Heave +ve B.Sounds, Abd Soft, No tenderness, No rebound - guarding or rigidity. No Cyanosis, Clubbing , No new Rash or bruise , chronic bilateral lower extremity lymphedema    Data Review:    CBC  Recent Labs Lab 06/17/16 1424 06/17/16 1452 06/18/16 0022 06/18/16 0611 06/18/16 1423 06/19/16 0422  WBC 6.8  --   --  7.1  --  9.0  HGB 7.1* 7.5* 8.7* 8.9* 9.0* 8.6*  HCT 23.3* 22.0* 27.5* 28.7* 29.0* 27.7*  PLT 166  --   --  159  --  143*  MCV 93.6  --   --  90.3  --  91.1  MCH 28.5  --   --  28.0  --  28.3  MCHC 30.5  --   --  31.0  --  31.0  RDW 16.8*  --   --  17.0*  --  17.0*  LYMPHSABS 0.7  --   --   --   --   --   MONOABS 0.8  --   --   --   --   --   EOSABS 0.1  --   --   --   --   --   BASOSABS 0.0  --   --   --   --   --     Chemistries   Recent Labs Lab 06/17/16 1424 06/17/16 1452 06/18/16 0611 06/19/16 0422  NA 137 142 140 137  K 3.9 3.8 3.9 3.7  CL 103 103 108 105  CO2 23  --  24 24  GLUCOSE 134* 130* 116* 106*  BUN 32* 31* 29* 24*  CREATININE 1.72* 1.90* 1.38* 1.15*  CALCIUM 8.7*  --  8.7* 8.5*  AST 34  --  27  --   ALT 16  --  16  --   ALKPHOS 77  --  77  --   BILITOT 0.8  --  1.4*  --     ------------------------------------------------------------------------------------------------------------------ No results for input(s): CHOL, HDL, LDLCALC, TRIG, CHOLHDL, LDLDIRECT in the last 72 hours.  Lab Results  Component Value Date   HGBA1C 6.0 (H) 10/02/2012   ------------------------------------------------------------------------------------------------------------------ No results for input(s): TSH, T4TOTAL, T3FREE, THYROIDAB in the last 72 hours.  Invalid input(s): FREET3 ------------------------------------------------------------------------------------------------------------------ No results for input(s): VITAMINB12, FOLATE, FERRITIN, TIBC, IRON, RETICCTPCT in the last 72 hours.  Coagulation profile  Recent Labs Lab 06/17/16 1424 06/18/16 1025 06/19/16 0422  INR 3.37 1.57 1.46    No results for input(s): DDIMER in the last 72 hours.  Cardiac Enzymes  Recent Labs Lab 06/17/16 1951 06/18/16 0022 06/18/16 0611  TROPONINI 0.15* 0.30* 0.36*   ------------------------------------------------------------------------------------------------------------------    Component Value Date/Time   BNP 232.7 (H) 08/30/2011 1141    Inpatient Medications  Scheduled Meds: . atorvastatin  80 mg Oral QHS  . digoxin  0.125 mg Oral Daily  . levETIRAcetam  500 mg Oral BID  . pantoprazole (PROTONIX) IV  40 mg Intravenous Q12H   Continuous Infusions: PRN Meds:.acetaminophen **OR** acetaminophen, MUSCLE RUB, ondansetron **OR** ondansetron (ZOFRAN) IV  Micro Results Recent Results (from the past 240 hour(s))  Blood culture (routine x 2)     Status: None (Preliminary result)   Collection Time: 06/17/16  3:28 PM  Result Value Ref Range Status   Specimen Description RIGHT ANTECUBITAL  Final   Special Requests BOTTLES DRAWN AEROBIC AND ANAEROBIC 5CC EACH  Final   Culture NO GROWTH 2 DAYS  Final   Report Status PENDING  Incomplete  Blood culture (routine x 2)      Status: None (Preliminary result)   Collection Time: 06/17/16  3:30 PM  Result Value Ref Range Status   Specimen Description LEFT ANTECUBITAL  Final   Special Requests BOTTLES DRAWN AEROBIC AND ANAEROBIC [place] EACH  Final   Culture NO GROWTH 2 DAYS  Final   Report Status PENDING  Incomplete  MRSA PCR Screening     Status: None   Collection Time: 06/17/16  6:29 PM  Result Value Ref Range Status   MRSA by PCR NEGATIVE NEGATIVE Final    Comment:        The GeneXpert MRSA Assay (FDA approved for NASAL specimens only), is one component of a comprehensive MRSA colonization surveillance program. It is not intended to diagnose MRSA  infection nor to guide or monitor treatment for MRSA infections.     Radiology Reports Ct Head Wo Contrast  Result Date: 06/17/2016 CLINICAL DATA:  Patient complains of feeling weak and dizzy for 4 days. One month ago she had bleeding from AVM in her stomach and had to be transfused. Hemoglobin very low, receiving transfusion right now/ had seizure today/ no hx of seizures. EXAM: CT HEAD WITHOUT CONTRAST TECHNIQUE: Contiguous axial images were obtained from the base of the skull through the vertex without intravenous contrast. COMPARISON:  None. FINDINGS: Brain: Mild low density in the periventricular white matter likely related to small vessel disease. No mass lesion, hemorrhage, hydrocephalus, acute infarct, intra-axial, or extra-axial fluid collection. Expected cerebral volume loss for age. Vascular: Carotid and vertebral artery calcified atherosclerosis. Skull: Normal Sinuses/Orbits: Normal orbits and globes. Clear paranasal sinuses and mastoid air cells. Cerumen in both external ear canals. Other: None IMPRESSION: 1.  No acute intracranial abnormality. 2. Mild small vessel ischemic change. Electronically Signed   By: Abigail Miyamoto M.D.   On: 06/17/2016 16:53   Dg Chest Portable 1 View  Result Date: 06/17/2016 CLINICAL DATA:  Weakness. EXAM: PORTABLE CHEST 1 VIEW  COMPARISON:  Radiographs of Sep 02, 2009. FINDINGS: Stable cardiomediastinal silhouette. Atherosclerosis of thoracic aorta is noted. No pneumothorax or pleural effusion is noted. Both lungs are clear. The visualized skeletal structures are unremarkable. IMPRESSION: No acute cardiopulmonary abnormality seen.  Aortic atherosclerosis. Electronically Signed   By: Marijo Conception, M.D.   On: 06/17/2016 15:08      Michaelle Bottomley M.D on 06/19/2016 at 11:10 AM  Between 7am to 7pm - Pager - [phone]  After 7pm go to [email or web address] - password [facility]  [facility] -  Office  [phone]

## 2016-06-19 NOTE — Progress Notes (Signed)
Subjective:    No complaints  Objective:   Temp:  [97.6 F (36.4 C)-98.3 F (36.8 C)] 97.6 F (36.4 C) (03/06 0801) Pulse Rate:  [58-76] 70 (03/06 0500) Resp:  [17-25] 18 (03/06 0801) BP: (93-131)/(36-64) 103/48 (03/06 0801) SpO2:  [88 %-95 %] 93 % (03/06 0801) Weight:  [194 lb 10.7 oz (88.3 kg)] 194 lb 10.7 oz (88.3 kg) (03/06 0435) Last BM Date: 06/17/16  Filed Weights   06/17/16 1414 06/18/16 0500 06/19/16 0435  Weight: 181 lb (82.1 kg) 182 lb 8.7 oz (82.8 kg) 194 lb 10.7 oz (88.3 kg)    Intake/Output Summary (Last 24 hours) at 06/19/16 0821 Last data filed at 06/19/16 0809  Gross per 24 hour  Intake          1151.67 ml  Output              450 ml  Net           701.67 ml    Telemetry: Rate controlled afib  Exam:  General: NAD  HEENT: sclera clear, throat clear  Resp: CTAB  Cardiac: irreg, no m/r/g, no jvd  GI: abdomen soft, NT, ND  MSK: 1-2+ bilateral LE edema  Neuro: no focal deficits  Psych: appropriate affect  Lab Results:  Basic Metabolic Panel:  Recent Labs Lab 06/17/16 1424 06/17/16 1452 06/18/16 0611 06/19/16 0422  NA 137 142 140 137  K 3.9 3.8 3.9 3.7  CL 103 103 108 105  CO2 23  --  24 24  GLUCOSE 134* 130* 116* 106*  BUN 32* 31* 29* 24*  CREATININE 1.72* 1.90* 1.38* 1.15*  CALCIUM 8.7*  --  8.7* 8.5*    Liver Function Tests:  Recent Labs Lab 06/17/16 1424 06/18/16 0611  AST 34 27  ALT 16 16  ALKPHOS 77 77  BILITOT 0.8 1.4*  PROT 6.2* 6.0*  ALBUMIN 3.0* 3.0*    CBC:  Recent Labs Lab 06/17/16 1424  06/18/16 0611 06/18/16 1423 06/19/16 0422  WBC 6.8  --  7.1  --  9.0  HGB 7.1*  < > 8.9* 9.0* 8.6*  HCT 23.3*  < > 28.7* 29.0* 27.7*  MCV 93.6  --  90.3  --  91.1  PLT 166  --  159  --  143*  < > = values in this interval not displayed.  Cardiac Enzymes:  Recent Labs Lab 06/17/16 1951 06/18/16 0022 06/18/16 0611  TROPONINI 0.15* 0.30* 0.36*    BNP: No results for input(s): PROBNP in the last  8760 hours.  Coagulation:  Recent Labs Lab 06/17/16 1424 06/18/16 1025 06/19/16 0422  INR 3.37 1.57 1.46    ECG:   Medications:   Scheduled Medications: . atorvastatin  80 mg Oral QHS  . digoxin  0.125 mg Oral Daily  . levETIRAcetam  500 mg Oral BID  . pantoprazole (PROTONIX) IV  40 mg Intravenous Q12H  . simethicone         Infusions:   PRN Medications:  acetaminophen **OR** acetaminophen, MUSCLE RUB, ondansetron **OR** ondansetron (ZOFRAN) IV     Assessment/Plan    1. Symptomatic anemia - management per primary team. Patient has received transfusion. Coumadin is on hold.  Received vitamin K  - plan for EGD today.   2. Elevated troponin - elevated troponin in setting of severe anemia and hypotension.  - EKG without acute ischemic changes, has chronic ST/T changes. She has had no cardiopulmonary symptoms.  - at this time do  not suspect ACS, likely related to severe anemia and hypotension in the setting of chronic CAD as opposed to acute obstructive CAD.  - with her recurrent GI bleeds would be a poor cath candidate - no plans for ischemic testing at this time.   3. Afib - beta blocker on hold, remains on digoxin oral. Rates overall controlled - coumadin on hold given recurrent GI bleed - still with soft bp's at times, continue to hold beta blocker today.   4. AKI - improving with transfusion and fluids  5. Chronic diastolic HF - clinical picture is clouded by her lymphedema. Admission labs and hypotension would support she was dry on admission.  -continue to hold diuretics today.      Carlyle Dolly, M.D.

## 2016-06-19 NOTE — Progress Notes (Signed)
Patient has no complaints. She had one stool last evening. It was soft black and not enlarged. Hgb 8.6. INR 1.46. BUN 24 and creatinine 1.15. EGD performed last night; results pending. Patient appears to be stable for EGD. Will proceed with EGD this afternoon. Clear liquid breakfast and thereafter nothing by mouth except by mouth meds.Marland Kitchen

## 2016-06-20 ENCOUNTER — Encounter (INDEPENDENT_AMBULATORY_CARE_PROVIDER_SITE_OTHER): Payer: Self-pay | Admitting: Internal Medicine

## 2016-06-20 ENCOUNTER — Encounter (HOSPITAL_COMMUNITY)
Admission: EM | Disposition: A | Discharge status: Home / Self Care | Payer: Self-pay | Source: Home / Self Care | Attending: Internal Medicine

## 2016-06-20 DIAGNOSIS — K31819 Angiodysplasia of stomach and duodenum without bleeding: Secondary | ICD-10-CM

## 2016-06-20 DIAGNOSIS — K922 Gastrointestinal hemorrhage, unspecified: Secondary | ICD-10-CM

## 2016-06-20 DIAGNOSIS — D649 Anemia, unspecified: Secondary | ICD-10-CM

## 2016-06-20 DIAGNOSIS — I482 Chronic atrial fibrillation: Secondary | ICD-10-CM

## 2016-06-20 DIAGNOSIS — D500 Iron deficiency anemia secondary to blood loss (chronic): Secondary | ICD-10-CM

## 2016-06-20 DIAGNOSIS — Z7901 Long term (current) use of anticoagulants: Secondary | ICD-10-CM

## 2016-06-20 DIAGNOSIS — K2901 Acute gastritis with bleeding: Secondary | ICD-10-CM

## 2016-06-20 HISTORY — PX: GIVENS CAPSULE STUDY: SHX5432

## 2016-06-20 LAB — CBC
HCT: 27.9 % — ABNORMAL LOW (ref 36.0–46.0)
HEMOGLOBIN: 8.3 g/dL — AB (ref 12.0–15.0)
MCH: 27.2 pg (ref 26.0–34.0)
MCHC: 29.7 g/dL — AB (ref 30.0–36.0)
MCV: 91.5 fL (ref 78.0–100.0)
Platelets: 154 10*3/uL (ref 150–400)
RBC: 3.05 MIL/uL — ABNORMAL LOW (ref 3.87–5.11)
RDW: 16.5 % — AB (ref 11.5–15.5)
WBC: 8.1 10*3/uL (ref 4.0–10.5)

## 2016-06-20 LAB — BASIC METABOLIC PANEL
Anion gap: 7 (ref 5–15)
BUN: 18 mg/dL (ref 6–20)
CHLORIDE: 106 mmol/L (ref 101–111)
CO2: 25 mmol/L (ref 22–32)
CREATININE: 0.88 mg/dL (ref 0.44–1.00)
Calcium: 8.4 mg/dL — ABNORMAL LOW (ref 8.9–10.3)
GFR calc non Af Amer: 60 mL/min — ABNORMAL LOW (ref >60)
Glucose, Bld: 102 mg/dL — ABNORMAL HIGH (ref 65–99)
Potassium: 3.8 mmol/L (ref 3.5–5.1)
SODIUM: 138 mmol/L (ref 135–145)

## 2016-06-20 LAB — PREPARE RBC (CROSSMATCH)

## 2016-06-20 LAB — HEMOGLOBIN AND HEMATOCRIT, BLOOD
HEMATOCRIT: 31.2 % — AB (ref 36.0–46.0)
Hemoglobin: 9.7 g/dL — ABNORMAL LOW (ref 12.0–15.0)

## 2016-06-20 LAB — PROTIME-INR
INR: 1.41
PROTHROMBIN TIME: 17.4 s — AB (ref 11.4–15.2)

## 2016-06-20 SURGERY — IMAGING PROCEDURE, GI TRACT, INTRALUMINAL, VIA CAPSULE

## 2016-06-20 MED ORDER — SODIUM CHLORIDE 0.9 % IV SOLN
Freq: Once | INTRAVENOUS | Status: AC
  Administered 2016-06-20: 14:00:00 via INTRAVENOUS

## 2016-06-20 NOTE — Progress Notes (Signed)
BPs improving but still soft at times, continue to hold beta blocker. Rates have done ok just on digoxin alone. Continue to hold coumadin at this time due to recurrent GI bleed and ongoing GI workup. If bp's continue to improve potentially add back low dose beta blocker at that time. We will continue to follow peripherally, no additional cardiology recs at this time.    Zandra Abts MD

## 2016-06-20 NOTE — Op Note (Signed)
Small Bowel Givens Capsule Study Procedure date:  06/20/2016.  Referring Provider:  Phillips Climes, MD PCP:  Dr. Purvis Kilts, MD  Indication for procedure:    Patient is 83 year old Caucasian female with recurrent GI bleed in the setting of chronic anticoagulation for atrial fibrillation. In May last year she was found to have gastric ulcer without stigmata of bleeding. One month ago she was actively bleeding from gastric fundal AV malformation treated with combination of coagulation and clipping. Now she returns with melena and anemia. EGD yesterday revealed 5 gastric AV malformation but none was bleeding. All were ablated with APC. She is undergoing this study to complete her GI workup. She is receiving third unit of PRBCs today.    Findings:   Patient was able to swallow given capsule study without any difficulty. No blood noted in the stomach small bowel or proximal colon. Three small punctate AV malformations involving small bowel without stigmata of bleed. Study duration 7 hours 46 minutes and 38 seconds.  First Gastric image:  1 min and 28 sec First Duodenal image: 5 min and 31 sec First Ileo-Cecal Valve image: 4 hrs 9 min and 55 sec First Cecal image: 4 hrs 10 min and 18 sec Gastric Passage time: 4 min Small Bowel Passage time:  4 hrs and 3 min  Summary & Recommendations:  Studies complete as Given capsule reached cecum. No noted in stomach small intestine or proximal colon. Three punctate AV malformations involving small bowel without stigmata of bleed.  If indeed patient has been bleeding from gastric AV malformations, he should not have any more bleeding since these lesions have been ablated. Patient should be able to resume warfarin in 3 days. Will check H&H in one week. If she remains stable she should be able to go formed tomorrow. If patient returns with another episode of overt GI bleed will proceed with GI bleeding scan.  Please note findings reviewed  with patient and her daughter who is at bedside.

## 2016-06-20 NOTE — Progress Notes (Signed)
PROGRESS NOTE                                                                                                                                                                                                             Patient Demographics:    [name], is a 83 y.o. female, DOB - 05-27-1932, TDH:[id number]  Admit date - 06/17/2016   Admitting Physician Oswald Hillock, MD  Outpatient Primary MD for the patient is Purvis Kilts, MD  LOS - 2  Outpatient Specialists: Cardiology Dr. [name], GI Dr Laural Golden  Chief Complaint  Patient presents with  . Dizziness       Brief Narrative   83 y.o. female, With history of GI bleed secondary to gastric AV malformation, status post endoscopic therapy for active bleeding AVM in February 2018, atrial fibrillation, on chronic anticoagulation with Coumadin, coronary atherosclerosis, hypertension who came to hospital with chief complaint of dizziness And generalized weakness, workup significant for anemia with hemoglobin of 7.1, hypotension and elevated troponins, as well as one episode of Loss of consciousness with jerking noted in ED.    Subjective:    [name] today has, No headache, No chest pain, No abdominal pain - No Nausea, One bowel movement overnight, denies any bright blood or melena, it was dark in color but soft .   Assessment  & Plan :    Active Problems:   Chronic atrial fibrillation (HCC)   Chronic anticoagulation   Symptomatic anemia   Chronic blood loss anemia   Chronic diastolic CHF (congestive heart failure) (HCC)   GI bleed   Symptomatic anemia - Secondary to chronic upper GI bleed from known AVM, hemoglobin 7.1 on admission, she received 2 units PRBC, hemoglobin is 8.3 today, remained stable,no bleeding overnight. -Will transfuse 1 unit of PRBCs per Dr. Olevia Perches request.  Upper GI bleed - Hemoccult positive stool, but no evidence of overt bleeding,  patient underwent  EGD last month and was found to have gastric AVM, which was coagulated. Continue with Protonix 40 mg IV every 12 hours. GI has been consulted ,  -EGD:  Normal esophagus.                           - 2 cm hiatal hernia.                           -  Five non-bleeding angiodysplastic lesions in the                            stomach. Treated with argon beam coagulation.                           - Normal duodenal bulb and second portion of the                            duodenum. -undergoing capsule endoscopy today. -Warfarin remains on hold.  Syncope versus Seizures - Patient  with witnessed episodes of unresponsiveness in ED, daughter describe it as a responsive, generalized stiffness, with upper extremity shaking , but no postictal symptoms, unclear if related to seizure versus syncope especially systolic blood pressure in the 60s during that episode. -Will Dc keppra as she has no known h./o seizures and this would be her first episode.  Atrial fibrillation - Heart rate is currently controlled, continue with digoxin, Emend was soft blood pressure, so beta blockers still on hold . - Continue  to hold warfarin  Chronic diastolic CHF - Patient  chronic lower extremity lymphedema, hypotensive on presentation, continue to hold diuretics and monitor closely for volume overload  Elevated troponins - He denies any chest pain or shortness of breath, most likely demand ischemia in the setting of anemia and hypotension.  AKI - Resolved with IV fluids and blood products  Code Status : DNR  Family Communication  : none at bedside  Disposition Plan  : Hope for DC home in 24-48 hours  Consults  :  GI, cardiology  Procedures  : None  DVT Prophylaxis  :   SCDs   Lab Results  Component Value Date   PLT 154 06/20/2016    Antibiotics  :    Anti-infectives    Start     Dose/Rate Route Frequency Ordered Stop   06/17/16 1515  cefTRIAXone (ROCEPHIN) 1 g in dextrose 5 % 50 mL IVPB     1 g  Intravenous  Once 06/17/16 1508 06/17/16 1530        Objective:   Vitals:   06/19/16 2122 06/20/16 0515 06/20/16 1334 06/20/16 1456  BP: (!) 120/44 (!) 120/48 (!) 132/45 (!) 136/39  Pulse: (!) 57 80 (!) 53 73  Resp: 18 18 18 19   Temp: 98.4 F (36.9 C) 98.7 F (37.1 C) 98.6 F (37 C) 98.3 F (36.8 C)  TempSrc: Oral Oral Oral Oral  SpO2: 94% 96% 98% 96%  Weight:      Height:        Wt Readings from Last 3 Encounters:  06/19/16 88.3 kg (194 lb 10.7 oz)  06/01/16 82.1 kg (181 lb)  05/25/16 83.6 kg (184 lb 6.4 oz)    No intake or output data in the 24 hours ending 06/20/16 1502   Physical Exam  Awake Alert, Oriented X 3, No new F.N deficits, Normal affect Supple Neck,No JVD,   Symmetrical Chest wall movement, Good air movement bilaterally, CTAB Irreg,No Gallops,Rubs or new Murmurs, No Parasternal Heave +ve B.Sounds, Abd Soft, No tenderness, No rebound - guarding or rigidity. No Cyanosis, Clubbing , No new Rash or bruise , chronic bilateral lower extremity lymphedema    Data Review:    CBC  Recent Labs Lab 06/17/16 1424  06/18/16 0611 06/18/16 1423 06/19/16 0422 06/19/16 1413 06/20/16 0439  WBC  6.8  --  7.1  --  9.0  --  8.1  HGB 7.1*  < > 8.9* 9.0* 8.6* 8.4* 8.3*  HCT 23.3*  < > 28.7* 29.0* 27.7* 26.9* 27.9*  PLT 166  --  159  --  143*  --  154  MCV 93.6  --  90.3  --  91.1  --  91.5  MCH 28.5  --  28.0  --  28.3  --  27.2  MCHC 30.5  --  31.0  --  31.0  --  29.7*  RDW 16.8*  --  17.0*  --  17.0*  --  16.5*  LYMPHSABS 0.7  --   --   --   --   --   --   MONOABS 0.8  --   --   --   --   --   --   EOSABS 0.1  --   --   --   --   --   --   BASOSABS 0.0  --   --   --   --   --   --   < > = values in this interval not displayed.  Chemistries   Recent Labs Lab 06/17/16 1424 06/17/16 1452 06/18/16 0611 06/19/16 0422 06/20/16 0439  NA 137 142 140 137 138  K 3.9 3.8 3.9 3.7 3.8  CL 103 103 108 105 106  CO2 23  --  24 24 25   GLUCOSE 134* 130* 116* 106*  102*  BUN 32* 31* 29* 24* 18  CREATININE 1.72* 1.90* 1.38* 1.15* 0.88  CALCIUM 8.7*  --  8.7* 8.5* 8.4*  AST 34  --  27  --   --   ALT 16  --  16  --   --   ALKPHOS 77  --  77  --   --   BILITOT 0.8  --  1.4*  --   --    ------------------------------------------------------------------------------------------------------------------ No results for input(s): CHOL, HDL, LDLCALC, TRIG, CHOLHDL, LDLDIRECT in the last 72 hours.  Lab Results  Component Value Date   HGBA1C 6.0 (H) 10/02/2012   ------------------------------------------------------------------------------------------------------------------ No results for input(s): TSH, T4TOTAL, T3FREE, THYROIDAB in the last 72 hours.  Invalid input(s): FREET3 ------------------------------------------------------------------------------------------------------------------ No results for input(s): VITAMINB12, FOLATE, FERRITIN, TIBC, IRON, RETICCTPCT in the last 72 hours.  Coagulation profile  Recent Labs Lab 06/17/16 1424 06/18/16 1025 06/19/16 0422 06/20/16 0439  INR 3.37 1.57 1.46 1.41    No results for input(s): DDIMER in the last 72 hours.  Cardiac Enzymes  Recent Labs Lab 06/17/16 1951 06/18/16 0022 06/18/16 0611  TROPONINI 0.15* 0.30* 0.36*   ------------------------------------------------------------------------------------------------------------------    Component Value Date/Time   BNP 232.7 (H) 08/30/2011 1141    Inpatient Medications  Scheduled Meds: . atorvastatin  80 mg Oral QHS  . digoxin  0.125 mg Oral Daily  . levETIRAcetam  500 mg Oral BID  . pantoprazole (PROTONIX) IV  40 mg Intravenous Q12H   Continuous Infusions: PRN Meds:.acetaminophen **OR** acetaminophen, MUSCLE RUB, ondansetron **OR** ondansetron (ZOFRAN) IV  Micro Results Recent Results (from the past 240 hour(s))  Blood culture (routine x 2)     Status: None (Preliminary result)   Collection Time: 06/17/16  3:28 PM  Result Value  Ref Range Status   Specimen Description RIGHT ANTECUBITAL  Final   Special Requests BOTTLES DRAWN AEROBIC AND ANAEROBIC 5CC EACH  Final   Culture NO GROWTH 3 DAYS  Final   Report Status PENDING  Incomplete  Blood culture (routine x 2)     Status: None (Preliminary result)   Collection Time: 06/17/16  3:30 PM  Result Value Ref Range Status   Specimen Description LEFT ANTECUBITAL  Final   Special Requests BOTTLES DRAWN AEROBIC AND ANAEROBIC [place] EACH  Final   Culture NO GROWTH 3 DAYS  Final   Report Status PENDING  Incomplete  MRSA PCR Screening     Status: None   Collection Time: 06/17/16  6:29 PM  Result Value Ref Range Status   MRSA by PCR NEGATIVE NEGATIVE Final    Comment:        The GeneXpert MRSA Assay (FDA approved for NASAL specimens only), is one component of a comprehensive MRSA colonization surveillance program. It is not intended to diagnose MRSA infection nor to guide or monitor treatment for MRSA infections.     Radiology Reports Ct Head Wo Contrast  Result Date: 06/17/2016 CLINICAL DATA:  Patient complains of feeling weak and dizzy for 4 days. One month ago she had bleeding from AVM in her stomach and had to be transfused. Hemoglobin very low, receiving transfusion right now/ had seizure today/ no hx of seizures. EXAM: CT HEAD WITHOUT CONTRAST TECHNIQUE: Contiguous axial images were obtained from the base of the skull through the vertex without intravenous contrast. COMPARISON:  None. FINDINGS: Brain: Mild low density in the periventricular white matter likely related to small vessel disease. No mass lesion, hemorrhage, hydrocephalus, acute infarct, intra-axial, or extra-axial fluid collection. Expected cerebral volume loss for age. Vascular: Carotid and vertebral artery calcified atherosclerosis. Skull: Normal Sinuses/Orbits: Normal orbits and globes. Clear paranasal sinuses and mastoid air cells. Cerumen in both external ear canals. Other: None IMPRESSION: 1.  No acute  intracranial abnormality. 2. Mild small vessel ischemic change. Electronically Signed   By: Abigail Miyamoto M.D.   On: 06/17/2016 16:53   Dg Chest Portable 1 View  Result Date: 06/17/2016 CLINICAL DATA:  Weakness. EXAM: PORTABLE CHEST 1 VIEW COMPARISON:  Radiographs of Sep 02, 2009. FINDINGS: Stable cardiomediastinal silhouette. Atherosclerosis of thoracic aorta is noted. No pneumothorax or pleural effusion is noted. Both lungs are clear. The visualized skeletal structures are unremarkable. IMPRESSION: No acute cardiopulmonary abnormality seen.  Aortic atherosclerosis. Electronically Signed   By: Marijo Conception, M.D.   On: 06/17/2016 15:08    Time Spent: 25 minutes  Lelon Frohlich M.D on 06/20/2016 at 3:02 PM  Between 7am to 7pm - Pager - [phone]  After 7pm go to [email or web address] - password [facility]  [facility] -  Office  [phone]

## 2016-06-20 NOTE — Care Management Note (Addendum)
Case Management Note  Patient Details  Name: [name] MRN: [id number] Date of Birth: September 29, 1932  Subjective/Objective:                  Pt admitted with GIB. Recent admission for same. She is from home, lives alone and is ind with ADL's. She has pcp, transportation and no difficulty affording he medications. She plans to return home with self care.   Action/Plan: No CM needs anticipated.   Expected Discharge Date:  06/20/16               Expected Discharge Plan:  Home/Self Care  In-House Referral:  NA  Discharge planning Services  CM Consult  Post Acute Care Choice:  NA Choice offered to:  NA  Status of Service:  Completed, signed off  Sherald Barge, RN 06/20/2016, 10:08 AM

## 2016-06-21 ENCOUNTER — Encounter (INDEPENDENT_AMBULATORY_CARE_PROVIDER_SITE_OTHER): Payer: Self-pay | Admitting: *Deleted

## 2016-06-21 ENCOUNTER — Other Ambulatory Visit (INDEPENDENT_AMBULATORY_CARE_PROVIDER_SITE_OTHER): Payer: Self-pay | Admitting: *Deleted

## 2016-06-21 ENCOUNTER — Encounter (HOSPITAL_COMMUNITY): Payer: Self-pay | Admitting: Internal Medicine

## 2016-06-21 ENCOUNTER — Telehealth (INDEPENDENT_AMBULATORY_CARE_PROVIDER_SITE_OTHER): Payer: Self-pay | Admitting: *Deleted

## 2016-06-21 DIAGNOSIS — K2921 Alcoholic gastritis with bleeding: Secondary | ICD-10-CM

## 2016-06-21 DIAGNOSIS — K254 Chronic or unspecified gastric ulcer with hemorrhage: Secondary | ICD-10-CM

## 2016-06-21 DIAGNOSIS — K922 Gastrointestinal hemorrhage, unspecified: Secondary | ICD-10-CM

## 2016-06-21 LAB — CBC
HEMATOCRIT: 30.6 % — AB (ref 36.0–46.0)
HEMOGLOBIN: 9.7 g/dL — AB (ref 12.0–15.0)
MCH: 28.8 pg (ref 26.0–34.0)
MCHC: 31.7 g/dL (ref 30.0–36.0)
MCV: 90.8 fL (ref 78.0–100.0)
Platelets: 164 10*3/uL (ref 150–400)
RBC: 3.37 MIL/uL — ABNORMAL LOW (ref 3.87–5.11)
RDW: 16 % — ABNORMAL HIGH (ref 11.5–15.5)
WBC: 9.5 10*3/uL (ref 4.0–10.5)

## 2016-06-21 LAB — PROTIME-INR
INR: 1.31
Prothrombin Time: 16.4 seconds — ABNORMAL HIGH (ref 11.4–15.2)

## 2016-06-21 MED ORDER — BISOPROLOL FUMARATE 5 MG PO TABS
5.0000 mg | ORAL_TABLET | Freq: Two times a day (BID) | ORAL | Status: DC
  Administered 2016-06-21: 5 mg via ORAL
  Filled 2016-06-21 (×3): qty 1

## 2016-06-21 NOTE — Care Management Important Message (Signed)
Important Message  Patient Details  Name: [name] MRN: [id number] Date of Birth: Apr 19, 1932   Medicare Important Message Given:  Yes    Sherald Barge, RN 06/21/2016, 2:40 PM

## 2016-06-21 NOTE — Progress Notes (Signed)
BP's improved, we will restart bisoprolol at lower dose 5mg  bid. From GI's note they are ok resume anticoag in 3 days with cbc check in 1 week, ok from our standpoint. We will sign off inpatient care.   Zandra Abts MD

## 2016-06-21 NOTE — Telephone Encounter (Addendum)
Today at 12:30 PM  Gaylan Gerold called to state she had been with mother since 3:30 am and was coughing quite a bit.  She is getting ready to be discharged and was understanding that she was to get chest xray to check cough today.  Dr. Jerilee Hoh said she didn't need this and she is very upset.  She would like Dr. Laural Golden to call her or sister and see if he would continue to have this order in place.  Message sent to Dr. Laural Golden.  They have spoke to nurse supervisor and she feels that is they say she doesn't need it and they do then they will have to all sit down and come to an agreement.   At 1:50 PM spoke with Vaughan Basta to see if issue resolved and she is very upset that the chest xray will not happen since Dr. Jerilee Hoh says she doesn't need it.  The sister at the hospital Romie Minus spoke to Dr. Laural Golden.  They (daughters)  are worried that she may have pneumonia or bronchitis and she is being sent home.  If she goes home and they have to come back this will not be good.  Her sister is trying to locate president of the hospital to see if she can override this and she can have this done for their peace of mind before she leaves hosptail. They feel what is this really hurting to get this done.    She had asked me to contact [place] or [place] --we have put in a call to Dr. Laural Golden to make aware .    They are very worried about their mom and want to make sure this issue is not the beginning of something worse and all the medications that have been changed since admission has them concerned.

## 2016-06-21 NOTE — Discharge Instructions (Signed)
May resume coumadin on Sunday 3/11.

## 2016-06-21 NOTE — Discharge Summary (Signed)
Physician Discharge Summary  [name] WCB:[id number] DOB: [birth year] DOA: 06/17/2016  PCP: Purvis Kilts, MD  Admit date: 06/17/2016 Discharge date: 06/21/2016  Time spent: 45 minutes  Recommendations for Outpatient Follow-up:  -Will be discharged home today. -Advised to follow up with Dr. Laural Golden in 2 weeks.   Discharge Diagnoses:  Active Problems:   Chronic atrial fibrillation (HCC)   Chronic anticoagulation   Symptomatic anemia   Chronic blood loss anemia   Chronic diastolic CHF (congestive heart failure) (HCC)   GI bleed   Discharge Condition: Stable and improved  Filed Weights   06/17/16 1414 06/18/16 0500 06/19/16 0435  Weight: 82.1 kg (181 lb) 82.8 kg (182 lb 8.7 oz) 88.3 kg (194 lb 10.7 oz)    History of present illness:  As per Dr. Darrick Meigs on 3/4: [name]  is a 83 y.o. female, With history of GI bleed secondary to gastric AV malformation, status post endoscopic therapy for active bleeding AVM in February 2018, atrial fibrillation, on chronic anticoagulation with Coumadin, coronary atherosclerosis, hypertension who came to hospital with chief complaint of dizziness, patient also had episode of vomiting and diarrhea on Thursday, and on Friday and Saturday she felt generally weak. She denies passing out.  In the ED, patient had a witnessed seizure-like activity, as per RN. This lasted for about 5 minutes, patient surprisingly was not postictal. No tongue biting. She had hypotension in the ED with systolic blood pressure in 60s.  She denies chest pain, no shortness of breath. Complains of abdominal pain, denies fever or dysuria. She has noticed black colored stool but she takes iron, no frank blood in the stool or vomitus. In the ED, patient was found to be anemic with hemoglobin 7.5, 1 unit PRBC ordered Also had mild elevation of troponin 0.17.  Hospital Course:   Symptomatic anemia - Secondary to chronic upper GI bleed from known AVM,  hemoglobin 7.1 on admission, she received 3 units PRBC, hemoglobin is 9.7 today, remained stable,no bleeding overnight.  Upper GI bleed - Hemoccult positive stool, but no evidence of overt bleeding,  patient underwent EGD last month and was found to have gastric AVM, which was coagulated. Continue with Protonix 40 mg IV every 12 hours. Adria Devon been consulted ,  -EGD: Normal esophagus. - 2 cm hiatal hernia. - Five non-bleeding angiodysplastic lesions in the  stomach. Treated with argon beam coagulation. - Normal duodenal bulb and second portion of the  duodenum. -capsule endoscopy: Three small punctate AV malformations involving small bowel without stigmata of bleed. -Per GI OK to resume coumadin in 3 days. Patient and daughter informed.  Syncope versus Seizures - Patient  with witnessed episodes of unresponsiveness in ED, daughter describe it as an unresponsive, generalized stiffness, with upper extremity shaking , but no postictal symptoms, unclear if related to seizure versus syncope especially systolic blood pressure in the 60s during that episode. -Will Dc keppra as she has no known h./o seizures and this would be her first episode.  Atrial fibrillation - HR controlled. Home meds resumed. - Continue  to hold warfarin and resumed in 3 days time.  Chronic diastolic CHF - Compensated.  Elevated troponins - He denies any chest pain or shortness of breath, most likely demand ischemia in the setting of anemia and hypotension.  AKI - Resolved with IV fluids and blood products  Procedures:  As above   Consultations:  GI, Dr. Laural Golden  Discharge Instructions  Discharge Instructions    Diet - low  sodium heart healthy    Complete by:  As directed    Increase activity slowly    Complete by:  As directed      Allergies as of 06/21/2016       Reactions   Diltiazem Other (See Comments)   Edema   Verapamil Other (See Comments)   Diarrhea      Medication List    STOP taking these medications   warfarin 5 MG tablet Commonly known as:  COUMADIN     TAKE these medications   atorvastatin 80 MG tablet Commonly known as:  LIPITOR TAKE 1 TABLET EVERY NIGHT AT BEDTIME   bisoprolol 10 MG tablet Commonly known as:  ZEBETA take 1 tablet by mouth twice a day   digoxin 0.125 MG tablet Commonly known as:  LANOXIN Take 1 tablet (0.125 mg total) by mouth daily.   FLINTSTONES PLUS IRON PO Take by mouth 2 (two) times daily.   losartan 100 MG tablet Commonly known as:  COZAAR Take 0.5 tablets (50 mg total) by mouth daily.   pantoprazole 40 MG tablet Commonly known as:  PROTONIX Take 1 tablet (40 mg total) by mouth 2 (two) times daily before a meal.   potassium chloride SA 20 MEQ tablet Commonly known as:  K-DUR,KLOR-CON take 1 tablet by mouth once daily   terazosin 5 MG capsule Commonly known as:  HYTRIN take 1 capsule by mouth at bedtime   torsemide 20 MG tablet Commonly known as:  DEMADEX Take 4 tablets (80 mg total) in the morning.  Take 2 tablets (40 mg total) in the evening. What changed:  how much to take  how to take this  when to take this  additional instructions      Allergies  Allergen Reactions  . Diltiazem Other (See Comments)    Edema  . Verapamil Other (See Comments)    Diarrhea   Follow-up Information    Purvis Kilts, MD In 2 weeks.   Specialty:  Family Medicine Why:  Call office for appointment Contact information: [address] [place] [address] [phone]            The results of significant diagnostics from this hospitalization (including imaging, microbiology, ancillary and laboratory) are listed below for reference.    Significant Diagnostic Studies: Ct Head Wo Contrast  Result Date: 06/17/2016 CLINICAL DATA:  Patient complains of feeling weak and  dizzy for 4 days. One month ago she had bleeding from AVM in her stomach and had to be transfused. Hemoglobin very low, receiving transfusion right now/ had seizure today/ no hx of seizures. EXAM: CT HEAD WITHOUT CONTRAST TECHNIQUE: Contiguous axial images were obtained from the base of the skull through the vertex without intravenous contrast. COMPARISON:  None. FINDINGS: Brain: Mild low density in the periventricular white matter likely related to small vessel disease. No mass lesion, hemorrhage, hydrocephalus, acute infarct, intra-axial, or extra-axial fluid collection. Expected cerebral volume loss for age. Vascular: Carotid and vertebral artery calcified atherosclerosis. Skull: Normal Sinuses/Orbits: Normal orbits and globes. Clear paranasal sinuses and mastoid air cells. Cerumen in both external ear canals. Other: None IMPRESSION: 1.  No acute intracranial abnormality. 2. Mild small vessel ischemic change. Electronically Signed   By: Abigail Miyamoto M.D.   On: 06/17/2016 16:53   Dg Chest Portable 1 View  Result Date: 06/17/2016 CLINICAL DATA:  Weakness. EXAM: PORTABLE CHEST 1 VIEW COMPARISON:  Radiographs of Sep 02, 2009. FINDINGS: Stable cardiomediastinal silhouette. Atherosclerosis of thoracic aorta is noted.  No pneumothorax or pleural effusion is noted. Both lungs are clear. The visualized skeletal structures are unremarkable. IMPRESSION: No acute cardiopulmonary abnormality seen.  Aortic atherosclerosis. Electronically Signed   By: Marijo Conception, M.D.   On: 06/17/2016 15:08    Microbiology: Recent Results (from the past 240 hour(s))  Blood culture (routine x 2)     Status: None (Preliminary result)   Collection Time: 06/17/16  3:28 PM  Result Value Ref Range Status   Specimen Description RIGHT ANTECUBITAL  Final   Special Requests BOTTLES DRAWN AEROBIC AND ANAEROBIC 5CC EACH  Final   Culture NO GROWTH 4 DAYS  Final   Report Status PENDING  Incomplete  Blood culture (routine x 2)     Status:  None (Preliminary result)   Collection Time: 06/17/16  3:30 PM  Result Value Ref Range Status   Specimen Description LEFT ANTECUBITAL  Final   Special Requests BOTTLES DRAWN AEROBIC AND ANAEROBIC [place]  Final   Culture NO GROWTH 4 DAYS  Final   Report Status PENDING  Incomplete  MRSA PCR Screening     Status: None   Collection Time: 06/17/16  6:29 PM  Result Value Ref Range Status   MRSA by PCR NEGATIVE NEGATIVE Final    Comment:        The GeneXpert MRSA Assay (FDA approved for NASAL specimens only), is one component of a comprehensive MRSA colonization surveillance program. It is not intended to diagnose MRSA infection nor to guide or monitor treatment for MRSA infections.      Labs: Basic Metabolic Panel:  Recent Labs Lab 06/17/16 1424 06/17/16 1452 06/18/16 0611 06/19/16 0422 06/20/16 0439  NA 137 142 140 137 138  K 3.9 3.8 3.9 3.7 3.8  CL 103 103 108 105 106  CO2 23  --  24 24 25   GLUCOSE 134* 130* 116* 106* 102*  BUN 32* 31* 29* 24* 18  CREATININE 1.72* 1.90* 1.38* 1.15* 0.88  CALCIUM 8.7*  --  8.7* 8.5* 8.4*   Liver Function Tests:  Recent Labs Lab 06/17/16 1424 06/18/16 0611  AST 34 27  ALT 16 16  ALKPHOS 77 77  BILITOT 0.8 1.4*  PROT 6.2* 6.0*  ALBUMIN 3.0* 3.0*   No results for input(s): LIPASE, AMYLASE in the last 168 hours. No results for input(s): AMMONIA in the last 168 hours. CBC:  Recent Labs Lab 06/17/16 1424  06/18/16 0611  06/19/16 0422 06/19/16 1413 06/20/16 0439 06/20/16 2012 06/21/16 0448  WBC 6.8  --  7.1  --  9.0  --  8.1  --  9.5  NEUTROABS 5.2  --   --   --   --   --   --   --   --   HGB 7.1*  < > 8.9*  < > 8.6* 8.4* 8.3* 9.7* 9.7*  HCT 23.3*  < > 28.7*  < > 27.7* 26.9* 27.9* 31.2* 30.6*  MCV 93.6  --  90.3  --  91.1  --  91.5  --  90.8  PLT 166  --  159  --  143*  --  154  --  164  < > = values in this interval not displayed. Cardiac Enzymes:  Recent Labs Lab 06/17/16 1424 06/17/16 1951 06/18/16 0022  06/18/16 0611  TROPONINI 0.17* 0.15* 0.30* 0.36*   BNP: BNP (last 3 results) No results for input(s): BNP in the last 8760 hours.  ProBNP (last 3 results) No results for input(s): PROBNP in  the last 8760 hours.  CBG: No results for input(s): GLUCAP in the last 168 hours.     SignedLelon Frohlich  [facility] Pager: [phone] 06/21/2016, 4:45 PM

## 2016-06-21 NOTE — Progress Notes (Signed)
Patient with orders to be discharge home. Discharge instructions given, patient verbalized understanding. Patient stable. Patient left in private vehicle with family.  

## 2016-06-22 ENCOUNTER — Encounter: Payer: Self-pay | Admitting: Physician Assistant

## 2016-06-22 ENCOUNTER — Ambulatory Visit (INDEPENDENT_AMBULATORY_CARE_PROVIDER_SITE_OTHER): Payer: Medicare Other | Admitting: Physician Assistant

## 2016-06-22 VITALS — BP 118/50 | HR 51 | Wt 181.0 lb

## 2016-06-22 DIAGNOSIS — D500 Iron deficiency anemia secondary to blood loss (chronic): Secondary | ICD-10-CM | POA: Diagnosis not present

## 2016-06-22 DIAGNOSIS — I482 Chronic atrial fibrillation, unspecified: Secondary | ICD-10-CM

## 2016-06-22 DIAGNOSIS — I5032 Chronic diastolic (congestive) heart failure: Secondary | ICD-10-CM

## 2016-06-22 DIAGNOSIS — I10 Essential (primary) hypertension: Secondary | ICD-10-CM | POA: Diagnosis not present

## 2016-06-22 DIAGNOSIS — I2510 Atherosclerotic heart disease of native coronary artery without angina pectoris: Secondary | ICD-10-CM

## 2016-06-22 LAB — CULTURE, BLOOD (ROUTINE X 2)
Culture: NO GROWTH
Culture: NO GROWTH

## 2016-06-22 MED ORDER — TORSEMIDE 20 MG PO TABS: 80.0000 mg | ORAL_TABLET | Freq: Two times a day (BID) | ORAL | 3 refills | Status: DC

## 2016-06-22 MED ORDER — POTASSIUM CHLORIDE CRYS ER 20 MEQ PO TBCR: 20.0000 meq | EXTENDED_RELEASE_TABLET | Freq: Two times a day (BID) | ORAL | 3 refills | Status: DC

## 2016-06-22 NOTE — Patient Instructions (Signed)
Your physician recommends that you schedule a follow-up appointment with Dr. [name]  Your physician has recommended you make the following change in your medication:  Stop Taking : Losartan Digoxin   Increase Torsemide to 80 mg Two Times Daily  Increase K Dur to 20 mEq Two Times Daily   L[facility] will call you with a follow-up date.  Your physician recommends that you return for lab work on the same day that you have your Coumadin checked.   Try ACE wraps for compression   If you need a refill on your cardiac medications before your next appointment, please call your pharmacy.  Thank you for choosing [facility]!

## 2016-06-22 NOTE — Progress Notes (Addendum)
Cardiology Office Note    Date:  06/22/2016  ID:  [name], DOB [birth year], MRN [id number] PCP:  Purvis Kilts, MD  Cardiologist:  Dr. [name]   Chief Complaint: f/u atrial fib  History of Present Illness:  [name] is a 83 y.o. female with history of chronic atrial fib on Coumadin, CAD (RCA stent 2003 with 60% LCx, 60% OM1, 80% OM2 disease at that time), h/o recurrent GIB (prior gastric ulcer/diverticulosis/AVM in 2017, gastric AVM s/p endoscopic therapy in 05/2016, recent recurrent GIB 06/2016), lymphedema, anemia, bilateral renal artery stenosis with bilateral stenting in 2004, carotid artery disease s/p R CEA, HTN, HLD, obesity, prior tobacco abuse, venous insufficiency, chronic diastolic CHF who is here for f/u of atrial fib. She was originally scheduled in this visit as a follow-up to recent diuretic titration, but was admitted in the interim. She was admitted 3/4-3/8 with weakness, hypotension in the 60s, acute on chronic anemia, hemoccult positive stool. She received 3 U PRBC. EGD showed five non-bleeding angiodysplastic lesions in the stomach, treated with argon beam coagulation. Capsule endoscopy showed three small punctate AV malformations involving small bowel without stigmata of bleed. Per GI, she was asked to resume Coumadin in 3 days (3/11). She also had question of seizure-like activity while BP was low. Cardiology followed for elevated troponin felt due to demand ischemia. (With recurrent GIB would be a poor cath candidate.) Her BB was temporarily held due to hypotension. Regarding HF, Dr. Harl Bowie noted that the picture is clouded by her lymphedema as admission labs and hypotension supported she was dry - in general, her LE edema appears to be a poor indicator of her total fluid status. Last labs showed Hgb 9.7, Cr 0.88 (up to 1.9). Last cardiology recommendations were to resume bisoprolol at lower dose of 5mg  BID but hospital discharge indicates 10mg  BID.  She  returns for follow-up with her daughter. She feels like her energy is gone and she wants to sleep a lot. She denies any chest pain, BRBPR, dyspnea. She feels her edema is slightly worse today than usual. It never goes away completely. She tries to elevate them but has been unable to find compression hose that either fit or that she is capable of applying herself. She lives alone and declines home health services. HR is in the low 50's today.   Past Medical History:  Diagnosis Date  . Carotid artery disease ([place])    RIght CEA 05/2004  . Chronic anticoagulation   . Chronic atrial fibrillation ([place])   . Chronic blood loss anemia   . Chronic diastolic CHF (congestive heart failure) ([place])   . Coronary atherosclerosis of native coronary artery    a. RCA stent 2003 with 60% LCx, 60% OM1, 80% OM2 disease at that time.  . Essential hypertension, benign   . GI bleeding    Recurrent GIB (prior gastric ulcer/diverticulosis/AVM in 2017, gastric AVM s/p endoscopic therapy in 05/2016, recurrent GIB 06/2016 with AVM s/p therapy on endoscopy.  . Hyperlipidemia   . Lymphedema    a. in general, her LE edema appears to be a poor indicator of her total fluid status - chronic edema.  . Obesity   . Renal artery stenosis ([facility])    Bilateral renal artery stents 2004  . Tobacco abuse, in remission    40 pack year stopped in 1989    Past Surgical History:  Procedure Laterality Date  . APPENDECTOMY    . CAROTID ENDARTERECTOMY  Right in 2005, left in 2010  . COLONOSCOPY  Never   Declines  . COLONOSCOPY N/A 08/19/2015   Procedure: COLONOSCOPY;  Surgeon: Rogene Houston, MD;  [facility];  Service: Endoscopy;  Laterality: N/A;  7:30  . CORONARY ANGIOPLASTY    . ESOPHAGOGASTRODUODENOSCOPY N/A 08/19/2015   Procedure: ESOPHAGOGASTRODUODENOSCOPY (EGD);  Surgeon: Rogene Houston, MD;  [facility];  Service: Endoscopy;  Laterality: N/A;  . ESOPHAGOGASTRODUODENOSCOPY N/A 05/24/2016   Procedure:  ESOPHAGOGASTRODUODENOSCOPY (EGD);  Surgeon: Rogene Houston, MD;  [facility];  Service: Endoscopy;  Laterality: N/A;  . ESOPHAGOGASTRODUODENOSCOPY N/A 06/19/2016   Procedure: ESOPHAGOGASTRODUODENOSCOPY (EGD);  Surgeon: Rogene Houston, MD;  [facility];  Service: Endoscopy;  Laterality: N/A;    Current Medications: Current Outpatient Prescriptions  Medication Sig Dispense Refill  . atorvastatin (LIPITOR) 80 MG tablet TAKE 1 TABLET EVERY NIGHT AT BEDTIME 60 tablet 6  . bisoprolol (ZEBETA) 10 MG tablet take 1 tablet by mouth twice a day 60 tablet 6  . digoxin (LANOXIN) 0.125 MG tablet Take 1 tablet (0.125 mg total) by mouth daily. 90 tablet 3  . losartan (COZAAR) 100 MG tablet Take 0.5 tablets (50 mg total) by mouth daily. 90 tablet 1  . pantoprazole (PROTONIX) 40 MG tablet Take 1 tablet (40 mg total) by mouth 2 (two) times daily before a meal. 60 tablet 0  . Pediatric Multivitamins-Iron (FLINTSTONES PLUS IRON PO) Take by mouth 2 (two) times daily.    . potassium chloride SA (K-DUR,KLOR-CON) 20 MEQ tablet take 1 tablet by mouth once daily 30 tablet 11  . terazosin (HYTRIN) 5 MG capsule take 1 capsule by mouth at bedtime 90 capsule 3  . torsemide (DEMADEX) 20 MG tablet Take 4 tablets (80 mg total) in the morning.  Take 2 tablets (40 mg total) in the evening. (Patient taking differently: Take 40-80 mg by mouth 2 (two) times daily. Take 4 tablets (80 mg total) in the morning.  Take 2 tablets (40 mg total) in the evening.) 180 tablet 3   No current facility-administered medications for this visit.      Allergies:   Diltiazem and Verapamil   Social History   Social History  . Marital status: Widowed    Spouse name: N/A  . Number of children: 4  . Years of education: N/A   Occupational History  . housewife Retired   Social History Main Topics  . Smoking status: Former Smoker    Types: Cigarettes    Quit date: 04/30/1987  . Smokeless tobacco: Never Used  . Alcohol  use No  . Drug use: No  . Sexual activity: Not Asked   Other Topics Concern  . None   Social History Narrative  . None     Family History:  Family History  Problem Relation Age of Onset  . Lung cancer Mother   . Cancer Mother   . Hypertension Daughter   . Hyperlipidemia Daughter   . Hypertension Son   . Hypertension Daughter   . Heart disease Maternal Grandmother     ROS:   Please see the history of present illness.  All other systems are reviewed and otherwise negative.    PHYSICAL EXAM:   VS:  BP (!) 118/50   Pulse (!) 51   Wt 181 lb (82.1 kg)   SpO2 92%   BMI 34.20 kg/m   BMI: Body mass index is 34.2 kg/m. GEN: Well nourished, well developed WF, obese, in  no acute distress  HEENT: normocephalic, atraumatic Neck: no JVD, carotid bruits, or masses Cardiac: irregular, bradycardic, no murmurs, rubs, or gallops, 2+ bilateral stiff edema without erythema, varicose veins Respiratory:  clear to auscultation bilaterally, normal work of breathing GI: soft, nontender, nondistended, + BS MS: no deformity or atrophy  Skin: warm and dry, no rash Neuro:  Alert and Oriented x 3, Strength and sensation are intact, follows commands Psych: euthymic mood, full affect  Wt Readings from Last 3 Encounters:  06/22/16 181 lb (82.1 kg)  06/19/16 194 lb 10.7 oz (88.3 kg)  06/01/16 181 lb (82.1 kg)      Studies/Labs Reviewed:   EKG:  EKG was not ordered today.  Recent Labs: 05/09/2016: TSH 1.76 06/18/2016: ALT 16 06/20/2016: BUN 18; Creatinine, Ser 0.88; Potassium 3.8; Sodium 138 06/21/2016: Hemoglobin 9.7; Platelets 164   Lipid Panel    Component Value Date/Time   CHOL 91 (L) 12/22/2014 1035   TRIG 79 12/22/2014 1035   HDL 34 (L) 12/22/2014 1035   CHOLHDL 2.7 12/22/2014 1035   VLDL 16 12/22/2014 1035   LDLCALC 41 12/22/2014 1035    Additional studies/ records that were reviewed today include: Summarized above.    ASSESSMENT & PLAN:   1. Chronic atrial fib -  difficult situation regarding anticoagulation. CHADSVASC is 6 so she would have significant risk of stroke off of anticoagulation. However, she's had recurrent GI bleeding. Per recent hospital stay, GI has cleared her to return back to Coumadin but this will need to be followed extremely closely. We may need to follow her with regular serial CBCs to stay on top of any chronic blood loss. Per recent discharge note, she will be resuming Coumadin on Sunday 06/24/16. Will have our nurse check with Coumadin clinic to find out when she should have her INR rechecked - will request repeat CBC at that visit. Her blood count yesterday was stable. Also of note, she reports overall fatigue and sleepiness. They were told the demerol and endoscopy meds could still be in her system for about 5 days. Will stop digoxin given her age, recent renal failure, bradycardia, and increased risk of mortality for patients on this. This was previously started when she was tachycardic but that may have been in response to her underlying anemia. 2. Chronic blood loss anemia - see above. Plan to recheck CBC when she returns for Coumadin follow-up. Would also consider repeat blood count in close follow-up as well. 3. Coronary artery disease - stable, no recent angina. Not on ASA due to risk of bleeding with concomitant warfarin. 4. HTN with recent hypotension - BP running on the softer side. She was put back on all her home meds. I will discontinue losartan for now to allow [place] to optimize therapy for her atrial fib and edema. 5. Chronic diastolic CHF - impressive edema on exam, c/w lymphedema. We discussed that diuretic can ameliorate the symptoms but is unlikely to resolve it completely. I advised she can try having her family use ACE wraps for compression starting from the heel up to the knee since she has not been able to use compression hose. Elevate legs as much as possible and follow low sodium diet. She affirms she stays within the fluid  restriction as advised. I will increase torsemide to 80mg  BID. I am stopping losartan because I do not want to drop her BP too much. We could consider addition of metolazone but I would hold off given recent AKI.  Disposition: F/u with  Dr. [name] or APP in 10 days - would recommend BMET and CBC at that visit given above changes. Have also encouraged f/u PCP as instructed during recent hospital stay.   Medication Adjustments/Labs and Tests Ordered: Current medicines are reviewed at length with the patient today.  Concerns regarding medicines are outlined above. Medication changes, Labs and Tests ordered today are summarized above and listed in the Patient Instructions accessible in Encounters.   Signed, Peggy Copa PA-C  06/22/2016 1:37 PM    [facility] Location in [place], [place] [address] Ph: [phone]; Fax [phone]

## 2016-06-23 NOTE — Telephone Encounter (Signed)
This was addressed with patient's daughter yesterday. I recommended that she talk with hospitalist because I am only taking care for GI problems. Dr. Jerilee Hoh saw her and actually thought she was doing well and patient was discharged.

## 2016-06-24 LAB — TYPE AND SCREEN
ABO/RH(D): O POS
Antibody Screen: NEGATIVE
Unit division: 0
Unit division: 0

## 2016-06-24 LAB — BPAM RBC
BLOOD PRODUCT EXPIRATION DATE: 201803262359
Blood Product Expiration Date: 201803262359
ISSUE DATE / TIME: 201803071554
UNIT TYPE AND RH: 5100
Unit Type and Rh: 5100

## 2016-06-25 ENCOUNTER — Encounter: Payer: Self-pay | Admitting: *Deleted

## 2016-06-25 DIAGNOSIS — I482 Chronic atrial fibrillation, unspecified: Secondary | ICD-10-CM

## 2016-06-25 DIAGNOSIS — Z5181 Encounter for therapeutic drug level monitoring: Secondary | ICD-10-CM

## 2016-06-25 NOTE — Progress Notes (Signed)
This encounter was created in error - please disregard.  This encounter was created in error - please disregard.

## 2016-07-02 ENCOUNTER — Ambulatory Visit (INDEPENDENT_AMBULATORY_CARE_PROVIDER_SITE_OTHER): Payer: Medicare Other | Admitting: *Deleted

## 2016-07-02 ENCOUNTER — Telehealth (INDEPENDENT_AMBULATORY_CARE_PROVIDER_SITE_OTHER): Payer: Self-pay | Admitting: Internal Medicine

## 2016-07-02 DIAGNOSIS — D500 Iron deficiency anemia secondary to blood loss (chronic): Secondary | ICD-10-CM | POA: Diagnosis not present

## 2016-07-02 DIAGNOSIS — I2510 Atherosclerotic heart disease of native coronary artery without angina pectoris: Secondary | ICD-10-CM | POA: Diagnosis not present

## 2016-07-02 DIAGNOSIS — I4891 Unspecified atrial fibrillation: Secondary | ICD-10-CM | POA: Diagnosis not present

## 2016-07-02 DIAGNOSIS — I482 Chronic atrial fibrillation, unspecified: Secondary | ICD-10-CM

## 2016-07-02 DIAGNOSIS — E6609 Other obesity due to excess calories: Secondary | ICD-10-CM | POA: Diagnosis not present

## 2016-07-02 DIAGNOSIS — Z6830 Body mass index (BMI) 30.0-30.9, adult: Secondary | ICD-10-CM | POA: Diagnosis not present

## 2016-07-02 DIAGNOSIS — K922 Gastrointestinal hemorrhage, unspecified: Secondary | ICD-10-CM | POA: Diagnosis not present

## 2016-07-02 DIAGNOSIS — Z5181 Encounter for therapeutic drug level monitoring: Secondary | ICD-10-CM

## 2016-07-02 LAB — CBC WITH DIFFERENTIAL/PLATELET
Basophils Absolute: 75 cells/uL (ref 0–200)
Basophils Relative: 1 %
EOS ABS: 75 {cells}/uL (ref 15–500)
Eosinophils Relative: 1 %
HCT: 33.3 % — ABNORMAL LOW (ref 35.0–45.0)
HEMOGLOBIN: 10.1 g/dL — AB (ref 11.7–15.5)
LYMPHS PCT: 12 %
Lymphs Abs: 900 cells/uL (ref 850–3900)
MCH: 26.3 pg — ABNORMAL LOW (ref 27.0–33.0)
MCHC: 30.3 g/dL — AB (ref 32.0–36.0)
MCV: 86.7 fL (ref 80.0–100.0)
MPV: 10.5 fL (ref 7.5–12.5)
Monocytes Absolute: 825 cells/uL (ref 200–950)
Monocytes Relative: 11 %
NEUTROS PCT: 75 %
Neutro Abs: 5625 cells/uL (ref 1500–7800)
Platelets: 199 10*3/uL (ref 140–400)
RBC: 3.84 MIL/uL (ref 3.80–5.10)
RDW: 15.8 % — AB (ref 11.0–15.0)
WBC: 7.5 10*3/uL (ref 3.8–10.8)

## 2016-07-02 LAB — POCT INR: INR: 1.8

## 2016-07-02 NOTE — Telephone Encounter (Signed)
Patient presented to the office and stated she needed a refill on Pantoprazole, she only has 4 pills left.  [phone]

## 2016-07-02 NOTE — Progress Notes (Addendum)
Cardiology Office Note    Date:  07/03/2016  ID:  [name], DOB [birth year], MRN [id number] PCP:  Purvis Kilts, MD  Cardiologist:  Dr. [name]   Chief Complaint: f/u heart rate and heart failure  History of Present Illness:  [name] is a 83 y.o. female with history of chronic atrial fib on Coumadin, CAD (RCA stent 2003 with 60% LCx, 60% OM1, 80% OM2 disease at that time), h/o recurrent GIB (prior gastric ulcer/diverticulosis/AVM in 2017, gastric AVM s/p endoscopic therapy in 05/2016, recent recurrent GIB 06/2016), lymphedema, anemia, bilateral renal artery stenosis with bilateral stenting in 2004, carotid artery disease s/p R CEA, HTN, HLD, obesity, prior tobacco abuse, venous insufficiency, chronic diastolic CHF who is here for f/u of atrial fib.  She was seen 05/2016 by Dr. [name] for increased edema prompting diuretic titration. She was admitted 3/4-06/21/16 with weakness, hypotension in the 60s, acute on chronic anemia, hemoccult positive stool. She received 3 U PRBC. EGD showed five non-bleeding angiodysplastic lesions in the stomach, treated with argon beam coagulation. Capsule endoscopy showed three small punctate AV malformations involving small bowel without stigmata of bleed. Per GI, she was asked to resume Coumadin in 3 days (3/11). She also had question of seizure-like activity while BP was low. Cardiology followed for elevated troponin felt due to demand ischemia. (With recurrent GIB would be a poor cath candidate.) Her BB was temporarily held due to hypotension. Regarding HF, Dr. Harl Bowie noted that the picture was clouded by her lymphedema as admission labs and hypotension supported she was dry - in general, her LE edema appears to be a poor indicator of her total fluid status. Last labs showed Hgb 9.7, Cr 0.88 (up to 1.9). Last cardiology recommendations were to resume bisoprolol at lower dose of 5mg  BID but hospital discharge instructions said 10mg  BID. GI  cleared her to resume Coumadin 3 days post discharge.  When she returned for follow-up on 06/01/16 she was reporting low energy and increased edema. HR was in the low 50s. Regarding edema, she tries to elevate her legs but has been unable to find compression hose that either fit or that she is capable of applying herself (lives alone and declines home health services). I stopped digoxin given her age, recent renal failure, & bradycardia. I also stopped losartan as blood pressure was borderline and we needed BP room to titrate her diuretic torsemide to 80mg  BID. ACE wraps were advised for her edema.  She returns for follow-up and reports she is feeling "great" - feeling much better. She reports she's about 97% back to normal. Edema persists but is improved from last week. No chest pain, dyspnea, or overt bleeding since back on Coumadin. Hgb yesterday was 10.1. She states she was delighted that her PCP was pleased with how she is doing.   Past Medical History:  Diagnosis Date  . Carotid artery disease ([place])    RIght CEA 05/2004  . Chronic anticoagulation   . Chronic atrial fibrillation ([place])   . Chronic blood loss anemia   . Chronic diastolic CHF (congestive heart failure) ([place])   . Coronary atherosclerosis of native coronary artery    a. RCA stent 2003 with 60% LCx, 60% OM1, 80% OM2 disease at that time.  . Essential hypertension, benign   . GI bleeding    Recurrent GIB (prior gastric ulcer/diverticulosis/AVM in 2017, gastric AVM s/p endoscopic therapy in 05/2016, recurrent GIB 06/2016 with AVM s/p therapy on endoscopy.  . Hyperlipidemia   .  Lymphedema    a. in general, her LE edema appears to be a poor indicator of her total fluid status - chronic edema.  . Obesity   . Renal artery stenosis ([facility])    Bilateral renal artery stents 2004  . Tobacco abuse, in remission    40 pack year stopped in 1989    Past Surgical History:  Procedure Laterality Date  . APPENDECTOMY    . CAROTID  ENDARTERECTOMY     Right in 2005, left in 2010  . COLONOSCOPY  Never   Declines  . COLONOSCOPY N/A 08/19/2015   Procedure: COLONOSCOPY;  Surgeon: Rogene Houston, MD;  [facility];  Service: Endoscopy;  Laterality: N/A;  7:30  . CORONARY ANGIOPLASTY    . ESOPHAGOGASTRODUODENOSCOPY N/A 08/19/2015   Procedure: ESOPHAGOGASTRODUODENOSCOPY (EGD);  Surgeon: Rogene Houston, MD;  [facility];  Service: Endoscopy;  Laterality: N/A;  . ESOPHAGOGASTRODUODENOSCOPY N/A 05/24/2016   Procedure: ESOPHAGOGASTRODUODENOSCOPY (EGD);  Surgeon: Rogene Houston, MD;  [facility];  Service: Endoscopy;  Laterality: N/A;  . ESOPHAGOGASTRODUODENOSCOPY N/A 06/19/2016   Procedure: ESOPHAGOGASTRODUODENOSCOPY (EGD);  Surgeon: Rogene Houston, MD;  [facility];  Service: Endoscopy;  Laterality: N/A;  . GIVENS CAPSULE STUDY N/A 06/20/2016   Procedure: GIVENS CAPSULE STUDY;  Surgeon: Rogene Houston, MD;  [facility];  Service: Endoscopy;  Laterality: N/A;    Current Medications: Current Outpatient Prescriptions  Medication Sig Dispense Refill  . atorvastatin (LIPITOR) 80 MG tablet TAKE 1 TABLET EVERY NIGHT AT BEDTIME 60 tablet 6  . bisoprolol (ZEBETA) 10 MG tablet take 1 tablet by mouth twice a day 60 tablet 6  . pantoprazole (PROTONIX) 40 MG tablet Take 1 tablet (40 mg total) by mouth 2 (two) times daily before a meal. 60 tablet 0  . Pediatric Multivitamins-Iron (FLINTSTONES PLUS IRON PO) Take by mouth 2 (two) times daily.    . potassium chloride SA (K-DUR,KLOR-CON) 20 MEQ tablet Take 1 tablet (20 mEq total) by mouth 2 (two) times daily. 180 tablet 3  . terazosin (HYTRIN) 5 MG capsule take 1 capsule by mouth at bedtime 90 capsule 3  . torsemide (DEMADEX) 20 MG tablet Take 4 tablets (80 mg total) by mouth 2 (two) times daily. 360 tablet 3  . warfarin (COUMADIN) 5 MG tablet      No current facility-administered medications for this visit.      Allergies:   Diltiazem  and Verapamil   Social History   Social History  . Marital status: Widowed    Spouse name: N/A  . Number of children: 4  . Years of education: N/A   Occupational History  . housewife Retired   Social History Main Topics  . Smoking status: Former Smoker    Types: Cigarettes    Quit date: 04/30/1987  . Smokeless tobacco: Never Used  . Alcohol use No  . Drug use: No  . Sexual activity: Not Asked   Other Topics Concern  . None   Social History Narrative  . None     Family History:  Family History  Problem Relation Age of Onset  . Lung cancer Mother   . Cancer Mother   . Hypertension Daughter   . Hyperlipidemia Daughter   . Hypertension Son   . Hypertension Daughter   . Heart disease Maternal Grandmother     ROS:   Please see the history of present illness.  All other systems are reviewed and otherwise negative.  PHYSICAL EXAM:   VS:  BP (!) 120/50   Pulse (!) 58   Ht 5\' 1"  (1.549 m)   Wt 161 lb (73 kg)   SpO2 95%   BMI 30.42 kg/m   BMI: Body mass index is 30.42 kg/m. GEN: Well nourished, well developed WF, obese, in no acute distress  HEENT: normocephalic, atraumatic Neck: no JVD, carotid bruits, or masses Cardiac: irregular, bradycardic, no murmurs, rubs, or gallops,1+ bilateral edema without erythema, notable varicose veins Respiratory:  clear to auscultation bilaterally, normal work of breathing GI: soft, nontender, nondistended, + BS MS: no deformity or atrophy  Skin: warm and dry, no rash Neuro:  Alert and Oriented x 3, Strength and sensation are intact, follows commands Psych: euthymic mood, full affect  Wt Readings from Last 3 Encounters:  07/03/16 161 lb (73 kg)  06/22/16 181 lb (82.1 kg)  06/19/16 194 lb 10.7 oz (88.3 kg)      Studies/Labs Reviewed:   EKG:  EKG was not ordered today.  Recent Labs: 05/09/2016: TSH 1.76 06/18/2016: ALT 16 06/20/2016: BUN 18; Creatinine, Ser 0.88; Potassium 3.8; Sodium 138 07/02/2016: Hemoglobin 10.1;  Platelets 199   Lipid Panel    Component Value Date/Time   CHOL 91 (L) 12/22/2014 1035   TRIG 79 12/22/2014 1035   HDL 34 (L) 12/22/2014 1035   CHOLHDL 2.7 12/22/2014 1035   VLDL 16 12/22/2014 1035   LDLCALC 41 12/22/2014 1035    Additional studies/ records that were reviewed today include: Summarized above.    ASSESSMENT & PLAN:   1. Chronic atrial fib - per last office note, difficult situation regarding anticoagulation. CHADSVASC is 6 so she would have significant risk of stroke off of anticoagulation. However, she's had recurrent GI bleeding. Per recent hospital stay, GI has cleared her to return back to Coumadin but this will need to be followed extremely closely. At this point would recommend episodic surveillance CBCs to stay on top of any progressive blood loss. Will recheck blood count in 1 month. Also asked patient to be vigilant for reporting any new sx. Rate is controlled off digoxin. 2. Chronic blood loss anemia - Hgb yesterday was stable. See above. 3. Coronary artery disease - stable, no recent angina. Not on ASA due to risk of bleeding with concomitant warfarin. 4. HTN with recent hypotension - BP improved off losartan. Follow. 5. Chronic diastolic CHF - edema on exam is c/w lymphedema. I had reviewed with patient last visit that diuretic can ameliorate the symptoms but is unlikely to resolve it completely. She did see improvement with ACE wraps, encouraged to continue. Will continue present dose of torsemide for now.  Disposition: F/u with Dr. [name] in 3 months, sooner if needed.   Medication Adjustments/Labs and Tests Ordered: Current medicines are reviewed at length with the patient today.  Concerns regarding medicines are outlined above. Medication changes, Labs and Tests ordered today are summarized above and listed in the Patient Instructions accessible in Encounters.   Signed, Melina Copa PA-C  07/03/2016 2:12 PM    [facility] Location in [place], [place] [address] Ph: [phone]; Fax [phone]

## 2016-07-02 NOTE — Telephone Encounter (Signed)
A prescription has been called to the patient's pharmacy-[facility] in [place].

## 2016-07-03 ENCOUNTER — Ambulatory Visit (INDEPENDENT_AMBULATORY_CARE_PROVIDER_SITE_OTHER): Payer: Medicare Other | Admitting: Physician Assistant

## 2016-07-03 ENCOUNTER — Encounter: Payer: Self-pay | Admitting: Physician Assistant

## 2016-07-03 ENCOUNTER — Encounter: Payer: Self-pay | Admitting: *Deleted

## 2016-07-03 VITALS — BP 120/50 | HR 58 | Ht 61.0 in | Wt 161.0 lb

## 2016-07-03 DIAGNOSIS — Z7901 Long term (current) use of anticoagulants: Secondary | ICD-10-CM

## 2016-07-03 DIAGNOSIS — I482 Chronic atrial fibrillation, unspecified: Secondary | ICD-10-CM

## 2016-07-03 DIAGNOSIS — D500 Iron deficiency anemia secondary to blood loss (chronic): Secondary | ICD-10-CM | POA: Diagnosis not present

## 2016-07-03 DIAGNOSIS — I5032 Chronic diastolic (congestive) heart failure: Secondary | ICD-10-CM

## 2016-07-03 DIAGNOSIS — I10 Essential (primary) hypertension: Secondary | ICD-10-CM | POA: Diagnosis not present

## 2016-07-03 DIAGNOSIS — Z5181 Encounter for therapeutic drug level monitoring: Secondary | ICD-10-CM

## 2016-07-03 DIAGNOSIS — I2510 Atherosclerotic heart disease of native coronary artery without angina pectoris: Secondary | ICD-10-CM

## 2016-07-03 NOTE — Patient Instructions (Signed)
Your physician recommends that you schedule a follow-up appointment in: 3 Months with Dr. [name].   Your physician recommends that you continue on your current medications as directed. Please refer to the Current Medication list given to you today.  Your physician recommends that you return for lab work in: 1 Month (April 20)   If you need a refill on your cardiac medications before your next appointment, please call your pharmacy.  Thank you for choosing [facility]!

## 2016-07-04 ENCOUNTER — Other Ambulatory Visit: Payer: Self-pay | Admitting: Cardiology

## 2016-07-16 ENCOUNTER — Ambulatory Visit (INDEPENDENT_AMBULATORY_CARE_PROVIDER_SITE_OTHER): Payer: Medicare Other | Admitting: *Deleted

## 2016-07-16 DIAGNOSIS — I4891 Unspecified atrial fibrillation: Secondary | ICD-10-CM | POA: Diagnosis not present

## 2016-07-16 DIAGNOSIS — I482 Chronic atrial fibrillation, unspecified: Secondary | ICD-10-CM

## 2016-07-16 DIAGNOSIS — I2510 Atherosclerotic heart disease of native coronary artery without angina pectoris: Secondary | ICD-10-CM

## 2016-07-16 DIAGNOSIS — Z5181 Encounter for therapeutic drug level monitoring: Secondary | ICD-10-CM

## 2016-07-16 LAB — POCT INR: INR: 1.9

## 2016-07-18 ENCOUNTER — Other Ambulatory Visit: Payer: Self-pay | Admitting: Cardiology

## 2016-08-01 ENCOUNTER — Ambulatory Visit (INDEPENDENT_AMBULATORY_CARE_PROVIDER_SITE_OTHER): Payer: Medicare Other | Admitting: *Deleted

## 2016-08-01 DIAGNOSIS — I4891 Unspecified atrial fibrillation: Secondary | ICD-10-CM

## 2016-08-01 DIAGNOSIS — Z5181 Encounter for therapeutic drug level monitoring: Secondary | ICD-10-CM

## 2016-08-01 DIAGNOSIS — I2510 Atherosclerotic heart disease of native coronary artery without angina pectoris: Secondary | ICD-10-CM

## 2016-08-01 DIAGNOSIS — Z7901 Long term (current) use of anticoagulants: Secondary | ICD-10-CM | POA: Diagnosis not present

## 2016-08-01 DIAGNOSIS — D500 Iron deficiency anemia secondary to blood loss (chronic): Secondary | ICD-10-CM | POA: Diagnosis not present

## 2016-08-01 DIAGNOSIS — I482 Chronic atrial fibrillation, unspecified: Secondary | ICD-10-CM

## 2016-08-01 LAB — CBC WITH DIFFERENTIAL/PLATELET
BASOS ABS: 0 {cells}/uL (ref 0–200)
Basophils Relative: 0 %
EOS ABS: 0 {cells}/uL — AB (ref 15–500)
Eosinophils Relative: 0 %
HEMATOCRIT: 27.5 % — AB (ref 35.0–45.0)
HEMOGLOBIN: 8.3 g/dL — AB (ref 11.7–15.5)
Lymphocytes Relative: 10 %
Lymphs Abs: 840 cells/uL — ABNORMAL LOW (ref 850–3900)
MCH: 24.3 pg — AB (ref 27.0–33.0)
MCHC: 30.2 g/dL — ABNORMAL LOW (ref 32.0–36.0)
MCV: 80.6 fL (ref 80.0–100.0)
MPV: 10 fL (ref 7.5–12.5)
Monocytes Absolute: 756 cells/uL (ref 200–950)
Monocytes Relative: 9 %
NEUTROS ABS: 6804 {cells}/uL (ref 1500–7800)
Neutrophils Relative %: 81 %
Platelets: 172 10*3/uL (ref 140–400)
RBC: 3.41 MIL/uL — ABNORMAL LOW (ref 3.80–5.10)
RDW: 16.2 % — ABNORMAL HIGH (ref 11.0–15.0)
WBC: 8.4 10*3/uL (ref 3.8–10.8)

## 2016-08-01 LAB — POCT INR: INR: 2.6

## 2016-08-02 ENCOUNTER — Other Ambulatory Visit: Payer: Self-pay

## 2016-08-02 ENCOUNTER — Telehealth (INDEPENDENT_AMBULATORY_CARE_PROVIDER_SITE_OTHER): Payer: Self-pay | Admitting: *Deleted

## 2016-08-02 ENCOUNTER — Telehealth: Payer: Self-pay

## 2016-08-02 DIAGNOSIS — D649 Anemia, unspecified: Secondary | ICD-10-CM

## 2016-08-02 NOTE — Telephone Encounter (Signed)
Just to clarify.  Are you going to send [place] a copy of her labs Monday for Dr. Laural Golden to review?  Just want to make sure we are on the same page.  After he reviews, we will be in touch with patient for further follow-up or do you want her seen by [place] Monday?  Thank you  [phone]

## 2016-08-02 NOTE — Telephone Encounter (Signed)
I spoke with patient,she denies black stools or bright red blood note.She will repeat CBC on Monday, and will call Dr Laural Golden for follow up.I faxed labs to his office.

## 2016-08-02 NOTE — Telephone Encounter (Signed)
I faxed labs to Dr Laural Golden already,pt knows to call him for fu

## 2016-08-06 DIAGNOSIS — K922 Gastrointestinal hemorrhage, unspecified: Secondary | ICD-10-CM | POA: Diagnosis not present

## 2016-08-06 DIAGNOSIS — K254 Chronic or unspecified gastric ulcer with hemorrhage: Secondary | ICD-10-CM | POA: Diagnosis not present

## 2016-08-06 LAB — HEMOGLOBIN AND HEMATOCRIT, BLOOD
HCT: 26.9 % — ABNORMAL LOW (ref 35.0–45.0)
HEMOGLOBIN: 8.2 g/dL — AB (ref 11.7–15.5)

## 2016-08-06 NOTE — Telephone Encounter (Signed)
Thank you--just wanted to make sure.

## 2016-08-08 ENCOUNTER — Other Ambulatory Visit (INDEPENDENT_AMBULATORY_CARE_PROVIDER_SITE_OTHER): Payer: Self-pay | Admitting: *Deleted

## 2016-08-08 DIAGNOSIS — D649 Anemia, unspecified: Secondary | ICD-10-CM

## 2016-08-10 ENCOUNTER — Other Ambulatory Visit (INDEPENDENT_AMBULATORY_CARE_PROVIDER_SITE_OTHER): Payer: Self-pay | Admitting: *Deleted

## 2016-08-10 ENCOUNTER — Encounter (INDEPENDENT_AMBULATORY_CARE_PROVIDER_SITE_OTHER): Payer: Self-pay | Admitting: *Deleted

## 2016-08-10 DIAGNOSIS — D649 Anemia, unspecified: Secondary | ICD-10-CM

## 2016-08-21 ENCOUNTER — Telehealth (INDEPENDENT_AMBULATORY_CARE_PROVIDER_SITE_OTHER): Payer: Self-pay | Admitting: *Deleted

## 2016-08-21 DIAGNOSIS — N289 Disorder of kidney and ureter, unspecified: Secondary | ICD-10-CM | POA: Diagnosis not present

## 2016-08-21 DIAGNOSIS — Z1389 Encounter for screening for other disorder: Secondary | ICD-10-CM | POA: Diagnosis not present

## 2016-08-21 DIAGNOSIS — I739 Peripheral vascular disease, unspecified: Secondary | ICD-10-CM | POA: Diagnosis not present

## 2016-08-21 DIAGNOSIS — D649 Anemia, unspecified: Secondary | ICD-10-CM | POA: Diagnosis not present

## 2016-08-21 DIAGNOSIS — I83893 Varicose veins of bilateral lower extremities with other complications: Secondary | ICD-10-CM | POA: Diagnosis not present

## 2016-08-21 DIAGNOSIS — I8390 Asymptomatic varicose veins of unspecified lower extremity: Secondary | ICD-10-CM | POA: Diagnosis not present

## 2016-08-21 DIAGNOSIS — Z6834 Body mass index (BMI) 34.0-34.9, adult: Secondary | ICD-10-CM | POA: Diagnosis not present

## 2016-08-21 DIAGNOSIS — I4891 Unspecified atrial fibrillation: Secondary | ICD-10-CM | POA: Diagnosis not present

## 2016-08-21 LAB — HEMOGLOBIN AND HEMATOCRIT, BLOOD
HCT: 27.8 % — ABNORMAL LOW (ref 35.0–45.0)
HEMOGLOBIN: 8.1 g/dL — AB (ref 11.7–15.5)

## 2016-08-21 NOTE — Telephone Encounter (Signed)
She called about her labwork and wants someone to call her back please

## 2016-08-21 NOTE — Telephone Encounter (Signed)
Patient's daughter, Vaughan Basta was called.  A message was left on her voicemail that the result was 8.1 , and per Dr.Rehman it is not coming up. He ask for me to look into getting her set up for Iron infusions. I will work on this tomorrow,08/22/2016 and call the patient 's daughter with any information I get.

## 2016-08-21 NOTE — Telephone Encounter (Signed)
Vaughan Basta called and said Tammy had called and she didn't understand if her insurance wouldn't cover iron infusion before why would they now.  She also didn't understand why she got a letter that had go 5/2 and that was after the  She received it.  I tried to explain  1.  Insurance companies are ever changing and we have to check just to be sure we are not missing anything.  Insurances are going to change more often and will be something we have to check always.  2.  The letters for labwork is dated the day the order or letter is produced and this doesn't mean she should have gone that day only she needs to go around that time for future reference.  She understood.  She also stated that about 3 weeks ago Dr. Laural Golden had said someone was going to call her about doing labs and no one ever did.  She didn't understand.  She asked to be sure that someone had called her mom too about this because she wants to always know things and she appreciates that we call her since her mom doesn't share everything quite right because of her hearing.  I did call Ms. Farrier and explained you would be working on this and call her late tomorrow or Thursday.  She said she understood.

## 2016-08-22 ENCOUNTER — Ambulatory Visit (INDEPENDENT_AMBULATORY_CARE_PROVIDER_SITE_OTHER): Payer: Medicare Other | Admitting: *Deleted

## 2016-08-22 DIAGNOSIS — Z5181 Encounter for therapeutic drug level monitoring: Secondary | ICD-10-CM | POA: Diagnosis not present

## 2016-08-22 DIAGNOSIS — I4891 Unspecified atrial fibrillation: Secondary | ICD-10-CM | POA: Diagnosis not present

## 2016-08-22 DIAGNOSIS — I2510 Atherosclerotic heart disease of native coronary artery without angina pectoris: Secondary | ICD-10-CM

## 2016-08-22 DIAGNOSIS — I482 Chronic atrial fibrillation, unspecified: Secondary | ICD-10-CM

## 2016-08-22 LAB — POCT INR: INR: 2.9

## 2016-08-23 ENCOUNTER — Encounter (HOSPITAL_COMMUNITY)
Admission: RE | Admit: 2016-08-23 | Discharge: 2016-08-23 | Disposition: A | Discharge status: Home / Self Care | Payer: Medicare Other | Source: Ambulatory Visit | Attending: Internal Medicine | Admitting: Internal Medicine

## 2016-08-23 ENCOUNTER — Encounter (HOSPITAL_COMMUNITY): Payer: Self-pay

## 2016-08-23 ENCOUNTER — Other Ambulatory Visit (INDEPENDENT_AMBULATORY_CARE_PROVIDER_SITE_OTHER): Payer: Self-pay | Admitting: *Deleted

## 2016-08-23 DIAGNOSIS — D649 Anemia, unspecified: Secondary | ICD-10-CM | POA: Insufficient documentation

## 2016-08-23 DIAGNOSIS — D509 Iron deficiency anemia, unspecified: Secondary | ICD-10-CM | POA: Insufficient documentation

## 2016-08-23 MED ORDER — SODIUM CHLORIDE 0.9 % IV SOLN
510.0000 mg | INTRAVENOUS | Status: DC
  Administered 2016-08-23: 510 mg via INTRAVENOUS
  Filled 2016-08-23: qty 17

## 2016-08-23 MED ORDER — SODIUM CHLORIDE 0.9 % IV SOLN: INTRAVENOUS | Status: DC

## 2016-08-23 NOTE — Telephone Encounter (Signed)
Patient has been approved for [facility]fusion x 2. Patient was called as was Vaughan Basta, her daughter. Patient will get her first infusion on 08/23/2016 , and the second one on 08/30/2016. She will also have her H&H done at that time.

## 2016-08-25 ENCOUNTER — Emergency Department (HOSPITAL_COMMUNITY): Payer: Medicare Other

## 2016-08-25 ENCOUNTER — Encounter (HOSPITAL_COMMUNITY): Payer: Self-pay | Admitting: Emergency Medicine

## 2016-08-25 ENCOUNTER — Inpatient Hospital Stay (HOSPITAL_COMMUNITY)
Admission: EM | Admit: 2016-08-25 | Discharge: 2016-08-28 | DRG: 292 | Disposition: A | Discharge status: Home / Self Care | Payer: Medicare Other | Attending: Internal Medicine | Admitting: Internal Medicine

## 2016-08-25 DIAGNOSIS — I360 Nonrheumatic tricuspid (valve) stenosis: Secondary | ICD-10-CM | POA: Diagnosis not present

## 2016-08-25 DIAGNOSIS — E785 Hyperlipidemia, unspecified: Secondary | ICD-10-CM | POA: Diagnosis present

## 2016-08-25 DIAGNOSIS — Z8249 Family history of ischemic heart disease and other diseases of the circulatory system: Secondary | ICD-10-CM

## 2016-08-25 DIAGNOSIS — I2510 Atherosclerotic heart disease of native coronary artery without angina pectoris: Secondary | ICD-10-CM | POA: Diagnosis present

## 2016-08-25 DIAGNOSIS — N183 Chronic kidney disease, stage 3 unspecified: Secondary | ICD-10-CM | POA: Diagnosis present

## 2016-08-25 DIAGNOSIS — I517 Cardiomegaly: Secondary | ICD-10-CM | POA: Diagnosis not present

## 2016-08-25 DIAGNOSIS — Z66 Do not resuscitate: Secondary | ICD-10-CM | POA: Diagnosis present

## 2016-08-25 DIAGNOSIS — N179 Acute kidney failure, unspecified: Secondary | ICD-10-CM | POA: Diagnosis present

## 2016-08-25 DIAGNOSIS — Z7901 Long term (current) use of anticoagulants: Secondary | ICD-10-CM | POA: Diagnosis not present

## 2016-08-25 DIAGNOSIS — Z9861 Coronary angioplasty status: Secondary | ICD-10-CM | POA: Diagnosis not present

## 2016-08-25 DIAGNOSIS — Z87891 Personal history of nicotine dependence: Secondary | ICD-10-CM

## 2016-08-25 DIAGNOSIS — R000 Tachycardia, unspecified: Secondary | ICD-10-CM | POA: Diagnosis not present

## 2016-08-25 DIAGNOSIS — E8770 Fluid overload, unspecified: Secondary | ICD-10-CM

## 2016-08-25 DIAGNOSIS — R0602 Shortness of breath: Secondary | ICD-10-CM

## 2016-08-25 DIAGNOSIS — T502X5A Adverse effect of carbonic-anhydrase inhibitors, benzothiadiazides and other diuretics, initial encounter: Secondary | ICD-10-CM | POA: Diagnosis present

## 2016-08-25 DIAGNOSIS — I5033 Acute on chronic diastolic (congestive) heart failure: Secondary | ICD-10-CM

## 2016-08-25 DIAGNOSIS — E876 Hypokalemia: Secondary | ICD-10-CM | POA: Diagnosis present

## 2016-08-25 DIAGNOSIS — I130 Hypertensive heart and chronic kidney disease with heart failure and stage 1 through stage 4 chronic kidney disease, or unspecified chronic kidney disease: Principal | ICD-10-CM | POA: Diagnosis present

## 2016-08-25 DIAGNOSIS — I959 Hypotension, unspecified: Secondary | ICD-10-CM | POA: Diagnosis present

## 2016-08-25 DIAGNOSIS — I10 Essential (primary) hypertension: Secondary | ICD-10-CM | POA: Diagnosis not present

## 2016-08-25 DIAGNOSIS — I482 Chronic atrial fibrillation, unspecified: Secondary | ICD-10-CM | POA: Diagnosis present

## 2016-08-25 DIAGNOSIS — D500 Iron deficiency anemia secondary to blood loss (chronic): Secondary | ICD-10-CM | POA: Diagnosis not present

## 2016-08-25 DIAGNOSIS — I509 Heart failure, unspecified: Secondary | ICD-10-CM | POA: Diagnosis present

## 2016-08-25 LAB — CBC WITH DIFFERENTIAL/PLATELET
BASOS ABS: 0.1 10*3/uL (ref 0.0–0.1)
Basophils Relative: 1 %
Eosinophils Absolute: 0.1 10*3/uL (ref 0.0–0.7)
Eosinophils Relative: 1 %
HEMATOCRIT: 30 % — AB (ref 36.0–46.0)
Hemoglobin: 8.9 g/dL — ABNORMAL LOW (ref 12.0–15.0)
LYMPHS ABS: 0.7 10*3/uL (ref 0.7–4.0)
LYMPHS PCT: 6 %
MCH: 22.8 pg — ABNORMAL LOW (ref 26.0–34.0)
MCHC: 29.7 g/dL — AB (ref 30.0–36.0)
MCV: 76.7 fL — AB (ref 78.0–100.0)
MONO ABS: 1 10*3/uL (ref 0.1–1.0)
MONOS PCT: 9 %
NEUTROS ABS: 9.1 10*3/uL — AB (ref 1.7–7.7)
Neutrophils Relative %: 83 %
Platelets: 188 10*3/uL (ref 150–400)
RBC: 3.91 MIL/uL (ref 3.87–5.11)
RDW: 17.7 % — AB (ref 11.5–15.5)
WBC: 10.9 10*3/uL — ABNORMAL HIGH (ref 4.0–10.5)

## 2016-08-25 LAB — COMPREHENSIVE METABOLIC PANEL
ALT: 20 U/L (ref 14–54)
AST: 45 U/L — AB (ref 15–41)
Albumin: 3.5 g/dL (ref 3.5–5.0)
Alkaline Phosphatase: 97 U/L (ref 38–126)
Anion gap: 12 (ref 5–15)
BILIRUBIN TOTAL: 1.6 mg/dL — AB (ref 0.3–1.2)
BUN: 20 mg/dL (ref 6–20)
CO2: 27 mmol/L (ref 22–32)
CREATININE: 1.47 mg/dL — AB (ref 0.44–1.00)
Calcium: 9.2 mg/dL (ref 8.9–10.3)
Chloride: 96 mmol/L — ABNORMAL LOW (ref 101–111)
GFR calc Af Amer: 37 mL/min — ABNORMAL LOW (ref >60)
GFR, EST NON AFRICAN AMERICAN: 32 mL/min — AB (ref >60)
Glucose, Bld: 121 mg/dL — ABNORMAL HIGH (ref 65–99)
POTASSIUM: 3.5 mmol/L (ref 3.5–5.1)
Sodium: 135 mmol/L (ref 135–145)
TOTAL PROTEIN: 7 g/dL (ref 6.5–8.1)

## 2016-08-25 LAB — D-DIMER, QUANTITATIVE: D-Dimer, Quant: 1.22 ug/mL-FEU — ABNORMAL HIGH (ref 0.00–0.50)

## 2016-08-25 LAB — BRAIN NATRIURETIC PEPTIDE: B Natriuretic Peptide: 438 pg/mL — ABNORMAL HIGH (ref 0.0–100.0)

## 2016-08-25 LAB — PROTIME-INR
INR: 3.14
Prothrombin Time: 33 seconds — ABNORMAL HIGH (ref 11.4–15.2)

## 2016-08-25 LAB — TROPONIN I: TROPONIN I: 0.03 ng/mL — AB (ref <0.03)

## 2016-08-25 MED ORDER — PANTOPRAZOLE SODIUM 40 MG PO TBEC
40.0000 mg | DELAYED_RELEASE_TABLET | Freq: Two times a day (BID) | ORAL | Status: DC
  Administered 2016-08-25 – 2016-08-28 (×7): 40 mg via ORAL
  Filled 2016-08-25 (×7): qty 1

## 2016-08-25 MED ORDER — SODIUM CHLORIDE 0.9% FLUSH
3.0000 mL | Freq: Two times a day (BID) | INTRAVENOUS | Status: DC
  Administered 2016-08-25 – 2016-08-28 (×7): 3 mL via INTRAVENOUS

## 2016-08-25 MED ORDER — ATORVASTATIN CALCIUM 40 MG PO TABS
80.0000 mg | ORAL_TABLET | Freq: Every day | ORAL | Status: DC
  Administered 2016-08-25 – 2016-08-27 (×3): 80 mg via ORAL
  Filled 2016-08-25 (×3): qty 2

## 2016-08-25 MED ORDER — SODIUM CHLORIDE 0.9% FLUSH: 3.0000 mL | INTRAVENOUS | Status: DC | PRN

## 2016-08-25 MED ORDER — IOPAMIDOL (ISOVUE-370) INJECTION 76%
80.0000 mL | Freq: Once | INTRAVENOUS | Status: AC | PRN
  Administered 2016-08-25: 80 mL via INTRAVENOUS

## 2016-08-25 MED ORDER — ACETAMINOPHEN 325 MG PO TABS
650.0000 mg | ORAL_TABLET | ORAL | Status: DC | PRN
  Administered 2016-08-27 – 2016-08-28 (×2): 650 mg via ORAL
  Filled 2016-08-25 (×2): qty 2

## 2016-08-25 MED ORDER — FUROSEMIDE 10 MG/ML IJ SOLN
80.0000 mg | Freq: Once | INTRAMUSCULAR | Status: AC
  Administered 2016-08-25: 80 mg via INTRAVENOUS
  Filled 2016-08-25: qty 8

## 2016-08-25 MED ORDER — FUROSEMIDE 10 MG/ML IJ SOLN
80.0000 mg | Freq: Three times a day (TID) | INTRAMUSCULAR | Status: DC
  Administered 2016-08-25 – 2016-08-26 (×2): 80 mg via INTRAVENOUS
  Filled 2016-08-25 (×2): qty 8

## 2016-08-25 MED ORDER — POTASSIUM CHLORIDE CRYS ER 20 MEQ PO TBCR
20.0000 meq | EXTENDED_RELEASE_TABLET | Freq: Two times a day (BID) | ORAL | Status: DC
  Administered 2016-08-25 – 2016-08-26 (×4): 20 meq via ORAL
  Filled 2016-08-25 (×5): qty 1

## 2016-08-25 MED ORDER — SODIUM CHLORIDE 0.9 % IV SOLN: 250.0000 mL | INTRAVENOUS | Status: DC | PRN

## 2016-08-25 MED ORDER — BISOPROLOL FUMARATE 5 MG PO TABS
10.0000 mg | ORAL_TABLET | Freq: Two times a day (BID) | ORAL | Status: DC
  Administered 2016-08-25 – 2016-08-28 (×6): 10 mg via ORAL
  Filled 2016-08-25 (×6): qty 2

## 2016-08-25 MED ORDER — WARFARIN - PHARMACIST DOSING INPATIENT
Status: DC
  Administered 2016-08-26 – 2016-08-28 (×3)

## 2016-08-25 MED ORDER — ONDANSETRON HCL 4 MG/2ML IJ SOLN: 4.0000 mg | Freq: Four times a day (QID) | INTRAMUSCULAR | Status: DC | PRN

## 2016-08-25 MED ORDER — TERAZOSIN HCL 5 MG PO CAPS
5.0000 mg | ORAL_CAPSULE | Freq: Every day | ORAL | Status: DC
  Administered 2016-08-26 – 2016-08-27 (×2): 5 mg via ORAL
  Filled 2016-08-25 (×2): qty 1

## 2016-08-25 NOTE — H&P (Signed)
TRH H&P   Patient Demographics:    [name], is a 83 y.o. female  MRN: [id number]   DOB - 11-05-1932  Admit Date - 08/25/2016  Outpatient Primary MD for the patient is [name], MD   Referring MD/NP/PA: Dr Dayna Barker  Outpatient Specialists: cardiology [name]  Patient coming from: Home  Chief Complaint  Patient presents with  . Tachycardia      HPI:    [name]  is a 83 y.o. female, with history of chronic atrial fib on Coumadin, CAD (RCA stent 2003 with 60% LCx, 60% OM1, 80% OM2 disease at that time), h/o recurrent GIB (prior gastric ulcer/diverticulosis/AVM in 2017, gastric AVM s/p endoscopic therapy in 05/2016, recent recurrent GIB 06/2016), lymphedema, anemia, bilateral renal artery stenosis with bilateral stenting in 2004, carotid artery disease s/p R CEA, HTN, HLD, obesity, prior tobacco abuse, venous insufficiency, chronic diastolic CHF who presents to ED with complaints of palpitation, lightheadedness, patient reports she has been feeling weak over the last couple days, she has dyspnea at baseline, reports a slightly worsened than usual, but she denies orthopnea, report has some palpitation, noticed her heart rate 105-110 on the pulse ox, which prompted her to come to the ED, she is known with baseline lymphedema, but she presents with impressive edema, all the way to mid abdomen, had elevated d-dimer is, CTA chest was obtained by ED physician which was negative for PE, she was started on IV Lasix, creatinine 1.47, glycohemoglobin 8.4 at baseline, she denies any chest pain, fever, chills, cough, hemoptysis, abdominal pain, nausea, vomiting or diarrhea.    Review of systems:    In addition to the HPI above,  No Fever-chills, No Headache, No changes with Vision or hearing,She reports lightheadedness No problems swallowing food or Liquids, No Chest pain,  Cough she reports Shortness of Breath, chronic , as well as she reports palpitation, reports worsening edema No Abdominal pain, No Nausea or Vommitting, Bowel movements are regular, No Blood in stool or Urine, No dysuria, No new skin rashes or bruises, No new joints pains-aches,  No new weakness, tingling, numbness in any extremity, report generalized weakness and fatigue No recent weight gain or loss, No polyuria, polydypsia or polyphagia, No significant Mental Stressors.  A full 10 point Review of Systems was done, except as stated above, all other Review of Systems were negative.   With Past History of the following :    Past Medical History:  Diagnosis Date  . Carotid artery disease ([place])    RIght CEA 05/2004  . Chronic anticoagulation   . Chronic atrial fibrillation ([place])   . Chronic blood loss anemia   . Chronic diastolic CHF (congestive heart failure) ([place])   . Coronary atherosclerosis of native coronary artery    a. RCA stent 2003 with 60% LCx, 60% OM1, 80% OM2 disease at that time.  . Essential hypertension, benign   .  GI bleeding    Recurrent GIB (prior gastric ulcer/diverticulosis/AVM in 2017, gastric AVM s/p endoscopic therapy in 05/2016, recurrent GIB 06/2016 with AVM s/p therapy on endoscopy.  . Hyperlipidemia   . Lymphedema    a. in general, her LE edema appears to be a poor indicator of her total fluid status - chronic edema.  . Obesity   . Renal artery stenosis ([facility])    Bilateral renal artery stents 2004  . Tobacco abuse, in remission    40 pack year stopped in 1989      Past Surgical History:  Procedure Laterality Date  . APPENDECTOMY    . CAROTID ENDARTERECTOMY     Right in 2005, left in 2010  . COLONOSCOPY  Never   Declines  . COLONOSCOPY N/A 08/19/2015   Procedure: COLONOSCOPY;  Surgeon: Rogene Houston, MD;  [facility];  Service: Endoscopy;  Laterality: N/A;  7:30  . CORONARY ANGIOPLASTY    . ESOPHAGOGASTRODUODENOSCOPY N/A 08/19/2015    Procedure: ESOPHAGOGASTRODUODENOSCOPY (EGD);  Surgeon: Rogene Houston, MD;  [facility];  Service: Endoscopy;  Laterality: N/A;  . ESOPHAGOGASTRODUODENOSCOPY N/A 05/24/2016   Procedure: ESOPHAGOGASTRODUODENOSCOPY (EGD);  Surgeon: Rogene Houston, MD;  [facility];  Service: Endoscopy;  Laterality: N/A;  . ESOPHAGOGASTRODUODENOSCOPY N/A 06/19/2016   Procedure: ESOPHAGOGASTRODUODENOSCOPY (EGD);  Surgeon: Rogene Houston, MD;  [facility];  Service: Endoscopy;  Laterality: N/A;  . GIVENS CAPSULE STUDY N/A 06/20/2016   Procedure: GIVENS CAPSULE STUDY;  Surgeon: Rogene Houston, MD;  [facility];  Service: Endoscopy;  Laterality: N/A;      Social History:     Social History  Substance Use Topics  . Smoking status: Former Smoker    Types: Cigarettes    Quit date: 04/30/1987  . Smokeless tobacco: Never Used  . Alcohol use No     Lives - At home  Mobility with assistance     Family History :     Family History  Problem Relation Age of Onset  . Lung cancer Mother   . Cancer Mother   . Hypertension Daughter   . Hyperlipidemia Daughter   . Hypertension Son   . Hypertension Daughter   . Heart disease Maternal Grandmother       Home Medications:   Prior to Admission medications   Medication Sig Start Date End Date Taking? Authorizing Provider  atorvastatin (LIPITOR) 80 MG tablet TAKE 1 TABLET EVERY NIGHT AT BEDTIME 05/07/16  Yes Satira Sark, MD  bisoprolol (ZEBETA) 10 MG tablet take 1 tablet by mouth twice a day 02/09/16  Yes Satira Sark, MD  pantoprazole (PROTONIX) 40 MG tablet Take 1 tablet (40 mg total) by mouth 2 (two) times daily before a meal. 05/25/16  Yes Eber Jones, MD  Pediatric Multivitamins-Iron (FLINTSTONES PLUS IRON PO) Take by mouth 2 (two) times daily.   Yes [provider]  potassium chloride SA (K-DUR,KLOR-CON) 20 MEQ tablet Take 1 tablet (20 mEq total) by mouth 2 (two) times daily. 06/22/16   Yes Dunn, Dayna N, PA-C  terazosin (HYTRIN) 5 MG capsule take 1 capsule by mouth at bedtime 07/04/16  Yes Satira Sark, MD  torsemide (DEMADEX) 20 MG tablet Take 4 tablets (80 mg total) by mouth 2 (two) times daily. 06/22/16 08/25/16 Yes Dunn, Nedra Hai, PA-C  warfarin (COUMADIN) 5 MG tablet Take 1 tablet daily except 1/2 tablet on Tuesdays and Saturdays or as directed by [facility] 07/19/16  Yes Satira Sark, MD     Allergies:     Allergies  Allergen Reactions  . Diltiazem Other (See Comments)    Edema  . Verapamil Other (See Comments)    Diarrhea     Physical Exam:   Vitals  Blood pressure (!) 116/57, pulse 71, temperature 97.9 F (36.6 C), temperature source Oral, resp. rate (!) 21, height 5\' 2"  (1.575 m), weight 81.6 kg (180 lb), SpO2 94 %.   1. General Frail elderly female lying in bed in NAD,    2. Normal affect and insight, Not Suicidal or Homicidal, Awake Alert, Oriented X 3.  3. No F.N deficits, ALL C.Nerves Intact, Strength 5/5 all 4 extremities, Sensation intact all 4 extremities, Plantars down going.  4. Ears and Eyes appear Normal, Conjunctivae clear, PERRLA. Moist Oral Mucosa.  5. Supple Neck, ++ JVD, No cervical lymphadenopathy appriciated, No Carotid Bruits.  6. Symmetrical Chest wall movement, no wheezing or crackles, but it into diminished at the bases  7. Irregular irregular, No Gallops, Rubs or Murmurs, No Parasternal Heave, with impressive edema, +4 in bilateral lower extremity, extended all the way to mid abdomen  8. Positive Bowel Sounds, Abdomen Soft, No tenderness,No rebound -guarding or rigidity, abdominal wall edema and lower part of her abdomen  9.  No Cyanosis, Normal Skin Turgor, No Skin Rash or Bruise.  10. Good muscle tone,  joints appear normal , no effusions, Normal ROM.  11. No Palpable Lymph Nodes in Neck or Axillae    Data Review:    CBC  Recent Labs Lab 08/21/16 1105 08/25/16 0922  WBC  --  10.9*  HGB 8.1* 8.9*    HCT 27.8* 30.0*  PLT  --  188  MCV  --  76.7*  MCH  --  22.8*  MCHC  --  29.7*  RDW  --  17.7*  LYMPHSABS  --  0.7  MONOABS  --  1.0  EOSABS  --  0.1  BASOSABS  --  0.1   ------------------------------------------------------------------------------------------------------------------  Chemistries   Recent Labs Lab 08/25/16 0922  NA 135  K 3.5  CL 96*  CO2 27  GLUCOSE 121*  BUN 20  CREATININE 1.47*  CALCIUM 9.2  AST 45*  ALT 20  ALKPHOS 97  BILITOT 1.6*   ------------------------------------------------------------------------------------------------------------------ estimated creatinine clearance is 29.7 mL/min (A) (by C-G formula based on SCr of 1.47 mg/dL (H)). ------------------------------------------------------------------------------------------------------------------ No results for input(s): TSH, T4TOTAL, T3FREE, THYROIDAB in the last 72 hours.  Invalid input(s): FREET3  Coagulation profile  Recent Labs Lab 08/22/16 1039 08/25/16 1027  INR 2.9 3.14   -------------------------------------------------------------------------------------------------------------------  Recent Labs  08/25/16 1027  DDIMER 1.22*   -------------------------------------------------------------------------------------------------------------------  Cardiac Enzymes  Recent Labs Lab 08/25/16 0922  TROPONINI 0.03*   ------------------------------------------------------------------------------------------------------------------    Component Value Date/Time   BNP 438.0 (H) 08/25/2016 0922   BNP 232.7 (H) 08/30/2011 1141     ---------------------------------------------------------------------------------------------------------------  Urinalysis    Component Value Date/Time   COLORURINE YELLOW 06/17/2016 1508   APPEARANCEUR HAZY (A) 06/17/2016 1508   LABSPEC 1.009 06/17/2016 1508   PHURINE 6.0 06/17/2016 1508   GLUCOSEU NEGATIVE 06/17/2016 1508   HGBUR  NEGATIVE 06/17/2016 1508   BILIRUBINUR NEGATIVE 06/17/2016 1508   KETONESUR NEGATIVE 06/17/2016 1508   PROTEINUR NEGATIVE 06/17/2016 1508   UROBILINOGEN 0.2 08/31/2009 1628   NITRITE NEGATIVE 06/17/2016 1508   LEUKOCYTESUR LARGE (A) 06/17/2016 1508    ----------------------------------------------------------------------------------------------------------------   Imaging Results:    Dg Chest 2 View  Result Date: 08/25/2016 CLINICAL DATA:  Atrial fibrillation.  Tachycardia. EXAM: CHEST  2 VIEW COMPARISON:  June 17, 2016 FINDINGS: Cardiomegaly persists. The hila and mediastinum are normal. No pneumothorax. No pulmonary nodules, masses, or focal infiltrates. No overt edema is identified. IMPRESSION: No acute abnormalities.  Cardiomegaly. Electronically Signed   By: Dorise Bullion III M.D   On: 08/25/2016 09:58   Ct Angio Chest Pe W Or Wo Contrast  Result Date: 08/25/2016 CLINICAL DATA:  Shortness of breath, lightheadedness, bilateral leg swelling. History of coronary artery disease, CHF, hypertension, lymphedema, renal artery stenosis with bilateral stents. EXAM: CT ANGIOGRAPHY CHEST WITH CONTRAST TECHNIQUE: Multidetector CT imaging of the chest was performed using the standard protocol during bolus administration of intravenous contrast. Multiplanar CT image reconstructions and MIPs were obtained to evaluate the vascular anatomy. CONTRAST:  80 cc Isovue 370 COMPARISON:  Chest CT angiogram dated 09/02/2009. FINDINGS: Cardiovascular: Some of the most peripheral segmental and subsegmental pulmonary artery branches are difficult to definitively characterize due to patient breathing motion artifact, however, there is no pulmonary embolism identified within the main, lobar or central segmental pulmonary arteries bilaterally. No aortic aneurysm or dissection. Prominent atherosclerotic changes along the walls of the thoracic aorta and upper abdominal aorta. Cardiomegaly. No pericardial effusion. Coronary  artery calcifications. Mediastinum/Nodes: No mass or enlarged lymph nodes seen within the mediastinum or perihilar regions. Esophagus is unremarkable. Trachea and central bronchi are unremarkable. Lungs/Pleura: Small bilateral pleural effusions. Mild atelectasis at each lung base. Lungs otherwise clear. Upper Abdomen: Distention of the IVC suggesting CHF. Small amount of ascites within the upper abdomen. Musculoskeletal: No acute or suspicious osseous finding. Ill-defined edema within the subcutaneous soft tissues of the upper abdomen suggesting anasarca. Review of the MIP images confirms the above findings. IMPRESSION: 1. No pulmonary embolism identified, with mild study limitations detailed above. 2. Cardiomegaly. Diffuse coronary artery calcifications. Distension of the IVC suggesting CHF, but no corresponding pulmonary edema at this point. 3. Small bilateral pleural effusions. 4. Aortic atherosclerosis.  No aortic aneurysm or dissection. 5. Small amount of ascites seen within the upper abdomen. 6. Presumed anasarca within the subcutaneous soft tissues of the upper abdomen, further indication of volume overload. Electronically Signed   By: Franki Cabot M.D.   On: 08/25/2016 12:55    My personal review of EKG: Rhythm A fib, Rate  91 /min, QTc 394 , no Acute ST changes   Assessment & Plan:    Active Problems:   Chronic atrial fibrillation (HCC)   Chronic anticoagulation   Hypertension   Hyperlipidemia   Acute renal failure superimposed on stage 3 chronic kidney disease (HCC)   CHF exacerbation (HCC)   Acute on chronic CHF - Presents with worsening edema up to mid abdomen , JVD distention, worsening dyspnea, even though no significant volume overload on chest x-ray or CT chest, she had cardiomegaly with distended IVC, she will be admitted to telemetry unit, will start on Lasix 80 mg IV 3 times daily, will check daily weights, strict ins and outs, fluid restriction, will repeat 2-D echo. - She is  on beta blockers, could not tolerate losartan in the past secondary to hypotension, while she needed titration of her heartrate controlling medications.  AKI on CK D stage III - Creatinine 1.47 on admission, monitor closely given she received IV contrast, as well she is on IV diuresis, avoid nephrotoxic medication  Chronic atrial fibrillation - CHADSVASC is 6, he is on warfarin with therapeutic INR, continue with warfarin, pharmacy to  dose, continue with bisoprolol, heart rate acceptable.  Chronic blood loss anemia - Continue the iron supplement, no evidence of significant GI bleed  CAD - Denies any chest pain, no recent angina, she is not on aspirin due to risk of bleeding with concomitant warfarin  Hypertension - Blood pressure acceptable, continue with bisoprolol     DVT Prophylaxis on warfarin for A Fib  AM Labs Ordered, also please review Full Orders  Family Communication: Admission, patients condition and plan of care including tests being ordered have been discussed with the patient and daughter who indicate understanding and agree with the plan and Code Status.  Code Status DNR, confirmed by patient , Daughter at bedside  Likely DC to  Home  Condition GUARDED    Consults called: none  Admission status: inpatient  Time spent in minutes : 65 minutes   [name] M.D on 08/25/2016 at 1:53 PM  Between 7am to 7pm - Pager - [phone]. After 7pm go to [email or web address] - password [facility]  [facility] - Office  [phone]

## 2016-08-25 NOTE — ED Provider Notes (Signed)
[facility] DE[facility] Provider Note   CSN: [id number] Arrival date & time: 08/25/16  0908  By signing my name below, I, [name], attest that this documentation has been prepared under the direction and in the presence of [name], Peggy Cornea, MD . Electronically Signed: Higinio Perkins, Scribe. 08/25/2016. 10:11 AM.  History   Chief Complaint Chief Complaint  Patient presents with  . Tachycardia   The history is provided by the patient. No language interpreter was used.   HPI Comments: [name] is a 83 y.o. female with PMHx of A-Fib on Warfarin, chronic anemia, CAD, HTN, and HLD, who presents to the Emergency Department complaining of gradually worsening, dizziness and lightheadedness described as "swimmy-headed" that began yesterday. Pt reports she has "not felt good" for ~2 weeks with associated sensation of palpitations, bilateral leg swelling, abdominal distension, and chronic shortness of breath with exertion. She states she checks her BP every day and noticed her heart rate was "running high" at 110, 105, and 102 consecutively this morning. She notes her last iron infusion was 2 days ago. Pt denies any chest pain, headaches, or hx of COPD.   Past Medical History:  Diagnosis Date  . Carotid artery disease ([place])    RIght CEA 05/2004  . Chronic anticoagulation   . Chronic atrial fibrillation ([place])   . Chronic blood loss anemia   . Chronic diastolic CHF (congestive heart failure) ([place])   . Coronary atherosclerosis of native coronary artery    a. RCA stent 2003 with 60% LCx, 60% OM1, 80% OM2 disease at that time.  . Essential hypertension, benign   . GI bleeding    Recurrent GIB (prior gastric ulcer/diverticulosis/AVM in 2017, gastric AVM s/p endoscopic therapy in 05/2016, recurrent GIB 06/2016 with AVM s/p therapy on endoscopy.  . Hyperlipidemia   . Lymphedema    a. in general, her LE edema appears to be a poor indicator of her total fluid status - chronic edema.  . Obesity   . Renal  artery stenosis (H[facility])    Bilateral renal artery stents 2004  . Tobacco abuse, in remission    40 pack year stopped in 1989    Patient Active Problem List   Diagnosis Date Noted  . GI bleed 06/17/2016  . Difficulty in walking, not elsewhere classified   . Dizziness and giddiness   . Gastrointestinal hemorrhage associated with peptic ulcer   . Renal impairment   . Acute blood loss anemia 05/22/2016  . Chronic blood loss anemia 05/22/2016  . Chronic diastolic CHF (congestive heart failure) ([place]) 05/22/2016  . Acute renal failure superimposed on stage 3 chronic kidney disease ([place]) 05/22/2016  . Anticoagulated on Coumadin   . Symptomatic anemia 07/11/2015  . Microcytic anemia 07/11/2015  . Fatigue 07/11/2015  . Occlusion and stenosis of carotid artery without mention of cerebral infarction-Bilateral 11/11/2013  . Aftercare following surgery of the circulatory system, [place] 11/11/2013  . Varicose veins of lower extremities with other complications 50/12/3816  . Encounter for therapeutic drug monitoring 05/18/2013  . Fasting hyperglycemia 07/03/2012  . Chronic anticoagulation   . Tobacco abuse, in remission   . Carotid artery occlusion   . Hypertension   . Obesity   . Hyperlipidemia   . Chronic venous insufficiency 07/26/2010  . Chronic atrial fibrillation ([place]) 04/05/2010  . Coronary atherosclerosis of native coronary artery 04/05/2010  . PERIPHERAL VASCULAR DISEASE 04/05/2010    Past Surgical History:  Procedure Laterality Date  . APPENDECTOMY    . CAROTID ENDARTERECTOMY  Right in 2005, left in 2010  . COLONOSCOPY  Never   Declines  . COLONOSCOPY N/A 08/19/2015   Procedure: COLONOSCOPY;  Surgeon: Rogene Houston, MD;  Location[facility];  Service: Endoscopy;  Laterality: N/A;  7:30  . CORONARY ANGIOPLASTY    . ESOPHAGOGASTRODUODENOSCOPY N/A 08/19/2015   Procedure: ESOPHAGOGASTRODUODENOSCOPY (EGD);  Surgeon: Rogene Houston, MD;  Location: [facility];  Service:  Endoscopy;  Laterality: N/A;  . ESOPHAGOGASTRODUODENOSCOPY N/A 05/24/2016   Procedure: ESOPHAGOGASTRODUODENOSCOPY (EGD);  Surgeon: Rogene Houston, MD;  Location: [facility];  Service: Endoscopy;  Laterality: N/A;  . ESOPHAGOGASTRODUODENOSCOPY N/A 06/19/2016   Procedure: ESOPHAGOGASTRODUODENOSCOPY (EGD);  Surgeon: Rogene Houston, MD;  Location: [facility];  Service: Endoscopy;  Laterality: N/A;  . GIVENS CAPSULE STUDY N/A 06/20/2016   Procedure: GIVENS CAPSULE STUDY;  Surgeon: Rogene Houston, MD;  Location: [facility];  Service: Endoscopy;  Laterality: N/A;    OB History    Gravida Para Term Preterm AB Living             4   SAB TAB Ectopic Multiple Live Births                 Home Medications    Prior to Admission medications   Medication Sig Start Date End Date Taking? Authorizing Provider  atorvastatin (LIPITOR) 80 MG tablet TAKE 1 TABLET EVERY NIGHT AT BEDTIME 05/07/16   Satira Sark, MD  bisoprolol (ZEBETA) 10 MG tablet take 1 tablet by mouth twice a day 02/09/16   Satira Sark, MD  pantoprazole (PROTONIX) 40 MG tablet Take 1 tablet (40 mg total) by mouth 2 (two) times daily before a meal. 05/25/16   Eber Jones, MD  Pediatric Multivitamins-Iron (FLINTSTONES PLUS IRON PO) Take by mouth 2 (two) times daily.    [provider]  potassium chloride SA (K-DUR,KLOR-CON) 20 MEQ tablet Take 1 tablet (20 mEq total) by mouth 2 (two) times daily. 06/22/16   Dunn, Nedra Hai, PA-C  terazosin (HYTRIN) 5 MG capsule take 1 capsule by mouth at bedtime 07/04/16   Satira Sark, MD  torsemide (DEMADEX) 20 MG tablet Take 4 tablets (80 mg total) by mouth 2 (two) times daily. 06/22/16 07/22/16  Charlie Pitter, PA-C  warfarin (COUMADIN) 5 MG tablet  06/06/16   [provider]  warfarin (COUMADIN) 5 MG tablet Take 1 tablet daily except 1/2 tablet on Tuesdays and Saturdays or as directed by [facility] 07/19/16   Satira Sark, MD    Family History Family  History  Problem Relation Age of Onset  . Lung cancer Mother   . Cancer Mother   . Hypertension Daughter   . Hyperlipidemia Daughter   . Hypertension Son   . Hypertension Daughter   . Heart disease Maternal Grandmother     Social History Social History  Substance Use Topics  . Smoking status: Former Smoker    Types: Cigarettes    Quit date: 04/30/1987  . Smokeless tobacco: Never Used  . Alcohol use No   Allergies   Diltiazem and Verapamil  Review of Systems Review of Systems  Respiratory: Positive for shortness of breath (chronic ).   Cardiovascular: Positive for palpitations and leg swelling. Negative for chest pain.  Gastrointestinal: Positive for abdominal distention.  Neurological: Negative for headaches.   Physical Exam Updated Vital Signs BP 111/71 (BP Location: Right Arm)   Pulse 98   Temp 97.9 F (36.6 C) (Oral)  Resp 18   Ht 5\' 2"  (1.575 m)   Wt 180 lb (81.6 kg)   SpO2 95%   BMI 32.92 kg/m   Physical Exam  Constitutional: She is oriented to person, place, and time. She appears well-developed and well-nourished. No distress.  HENT:  Head: Normocephalic and atraumatic.  Eyes: EOM are normal.  Neck: Normal range of motion.  Cardiovascular: Regular rhythm and normal heart sounds.  Tachycardia present.   Irregular but otherwise normal   Pulmonary/Chest: Effort normal and breath sounds normal.  Fine rales at the bases but otherwise normal   Abdominal: Soft. She exhibits no distension. There is no tenderness.  Abdomen is without pitting edema  Musculoskeletal: Normal range of motion. She exhibits edema.  Edema up to her thigh   Neurological: She is alert and oriented to person, place, and time.  Cranial nerves are normal, strength and sensation in BUE and BLE are normal.   Skin: Skin is warm and dry.  Psychiatric: She has a normal mood and affect. Judgment normal.  Nursing note and vitals reviewed.  ED Treatments / Results  DIAGNOSTIC  STUDIES:  Oxygen Saturation is 95% on RA, normal by my interpretation.    COORDINATION OF CARE:  10:06 AM Discussed treatment Perkins with pt at bedside and pt agreed to Perkins.  Labs (all labs ordered are listed, but only abnormal results are displayed) Labs Reviewed  CBC WITH DIFFERENTIAL/PLATELET - Abnormal; Notable for the following:       Result Value   WBC 10.9 (*)    Hemoglobin 8.9 (*)    HCT 30.0 (*)    MCV 76.7 (*)    MCH 22.8 (*)    MCHC 29.7 (*)    RDW 17.7 (*)    Neutro Abs 9.1 (*)    All other components within normal limits  COMPREHENSIVE METABOLIC PANEL  TROPONIN I    EKG  EKG Interpretation  Date/Time:  Saturday Aug 25 2016 09:20:57 EDT Ventricular Rate:  91 PR Interval:    QRS Duration: 105 QT Interval:  320 QTC Calculation: 394 R Axis:   101 Text Interpretation:  Atrial fibrillation Consider right ventricular hypertrophy Nonspecific repol abnormality, diffuse leads Confirmed by [facility] MD, [name] [phone]) on 08/25/2016 9:40:02 AM       Radiology Dg Chest 2 View  Result Date: 08/25/2016 CLINICAL DATA:  Atrial fibrillation.  Tachycardia. EXAM: CHEST  2 VIEW COMPARISON:  June 17, 2016 FINDINGS: Cardiomegaly persists. The hila and mediastinum are normal. No pneumothorax. No pulmonary nodules, masses, or focal infiltrates. No overt edema is identified. IMPRESSION: No acute abnormalities.  Cardiomegaly. Electronically Signed   By: Dorise Bullion III M.D   On: 08/25/2016 09:58    Procedures Procedures (including critical care time)  Medications Ordered in ED Medications - No data to display  Initial Impression / Assessment and Perkins / ED Course  I have reviewed the triage vital signs and the nursing notes.  Pertinent labs & imaging results that were available during my care of the patient were reviewed by me and considered in my medical decision making (see chart for details).     Here with diffuse edema, borderline anasarca along with ascites and  bilateral pleural effusions. Suspect she needs significant diuresis more than can be done in ER. Mildly hypoxic on room air just at rest, also suspect she would be much more hypoxic while ambulating. Will give iv lasix in ER and Perkins for admission for further diuresis.   I personally performed  the services described in this documentation, which was scribed in my presence. The recorded information has been reviewed and is accurate.   Final Clinical Impressions(s) / ED Diagnoses   Final diagnoses:  SOB (shortness of breath)  Hypervolemia, unspecified hypervolemia type      Merrily Pew, MD 08/26/16 [phone]

## 2016-08-25 NOTE — ED Notes (Addendum)
Bedside toilet put in room

## 2016-08-25 NOTE — ED Notes (Signed)
Given cup of ice water

## 2016-08-25 NOTE — ED Notes (Signed)
CRITICAL VALUE ALERT  Critical value received:  Trop 0.03  Date of notification:  08/25/16  Time of notification:  6244  Critical value read back:Yes.    Nurse who received alert:  RMinter, RN  MD notified (1st page):  Dr. Dayna Barker  Time of first page:  26  MD notified (2nd page):  Time of second page:  Responding MD:  Dr. Dayna Barker  Time MD responded:  1009

## 2016-08-25 NOTE — ED Triage Notes (Signed)
PT c/o high heart rate with dizziness over the past two weeks worsening since yesterday. PT states she had an iron infusion this past Thursday. PT denies any chest pain or SOB but states increased edema to bilateral lower extremities.

## 2016-08-25 NOTE — Progress Notes (Signed)
ANTICOAGULATION CONSULT NOTE - Initial Consult  Pharmacy Consult for Warfarin Indication: atrial fibrillation  Allergies  Allergen Reactions  . Diltiazem Other (See Comments)    Edema  . Verapamil Other (See Comments)    Diarrhea    Patient Measurements: Height: 5\' 2"  (157.5 cm) Weight: 180 lb (81.6 kg) IBW/kg (Calculated) : 50.1 Heparin Dosing Weight:   Vital Signs: Temp: 97.9 F (36.6 C) (05/12 0926) Temp Source: Oral (05/12 0926) BP: 116/57 (05/12 1300) Pulse Rate: 71 (05/12 1300)  Labs:  Recent Labs  08/25/16 0922 08/25/16 1027  HGB 8.9*  --   HCT 30.0*  --   PLT 188  --   LABPROT  --  33.0*  INR  --  3.14  CREATININE 1.47*  --   TROPONINI 0.03*  --     Estimated Creatinine Clearance: 29.7 mL/min (A) (by C-G formula based on SCr of 1.47 mg/dL (H)).   Medical History: Past Medical History:  Diagnosis Date  . Carotid artery disease ([place])    RIght CEA 05/2004  . Chronic anticoagulation   . Chronic atrial fibrillation ([place])   . Chronic blood loss anemia   . Chronic diastolic CHF (congestive heart failure) ([place])   . Coronary atherosclerosis of native coronary artery    a. RCA stent 2003 with 60% LCx, 60% OM1, 80% OM2 disease at that time.  . Essential hypertension, benign   . GI bleeding    Recurrent GIB (prior gastric ulcer/diverticulosis/AVM in 2017, gastric AVM s/p endoscopic therapy in 05/2016, recurrent GIB 06/2016 with AVM s/p therapy on endoscopy.  . Hyperlipidemia   . Lymphedema    a. in general, her LE edema appears to be a poor indicator of her total fluid status - chronic edema.  . Obesity   . Renal artery stenosis (HCC)    Bilateral renal artery stents 2004  . Tobacco abuse, in remission    40 pack year stopped in 1989    Medications:   (Not in a hospital admission)  Assessment: Continuation of warfarin PTA for AFIB INR elevated on admission (3.14)  Goal of Therapy:  INR 2-3 Monitor platelets by anticoagulation protocol: Yes    Plan:  No Coumadin today due to elevated INR INR/PT daily Monitor for signs of bleeding  Abner Greenspan, Nailani Full Bennett 08/25/2016,1:59 PM

## 2016-08-25 NOTE — Telephone Encounter (Signed)
Patient was increased on flecainide dose to 100 mg BID. She was unable to tolerate this due to fatigue and weakness. The dose was decreased to 75 mg BID. Her husband called today stating that she was feeling weak and her HR was low.   I advised her to not take the evening dose of the flecainide, and in the am, to start 50 mg BID.  She is to call office on Monday to make appointment with Dr. Wynonia Lawman.

## 2016-08-26 ENCOUNTER — Inpatient Hospital Stay (HOSPITAL_COMMUNITY): Payer: Medicare Other

## 2016-08-26 DIAGNOSIS — I360 Nonrheumatic tricuspid (valve) stenosis: Secondary | ICD-10-CM

## 2016-08-26 LAB — CBC
HCT: 26.7 % — ABNORMAL LOW (ref 36.0–46.0)
Hemoglobin: 7.8 g/dL — ABNORMAL LOW (ref 12.0–15.0)
MCH: 22.6 pg — AB (ref 26.0–34.0)
MCHC: 29.2 g/dL — AB (ref 30.0–36.0)
MCV: 77.4 fL — AB (ref 78.0–100.0)
PLATELETS: 161 10*3/uL (ref 150–400)
RBC: 3.45 MIL/uL — ABNORMAL LOW (ref 3.87–5.11)
RDW: 17.6 % — AB (ref 11.5–15.5)
WBC: 10.1 10*3/uL (ref 4.0–10.5)

## 2016-08-26 LAB — ECHOCARDIOGRAM COMPLETE
Height: 62 in
Weight: 2928 oz

## 2016-08-26 LAB — BASIC METABOLIC PANEL
Anion gap: 9 (ref 5–15)
BUN: 17 mg/dL (ref 6–20)
CALCIUM: 9 mg/dL (ref 8.9–10.3)
CO2: 30 mmol/L (ref 22–32)
Chloride: 97 mmol/L — ABNORMAL LOW (ref 101–111)
Creatinine, Ser: 1.19 mg/dL — ABNORMAL HIGH (ref 0.44–1.00)
GFR calc Af Amer: 48 mL/min — ABNORMAL LOW (ref >60)
GFR, EST NON AFRICAN AMERICAN: 42 mL/min — AB (ref >60)
GLUCOSE: 98 mg/dL (ref 65–99)
POTASSIUM: 3 mmol/L — AB (ref 3.5–5.1)
Sodium: 136 mmol/L (ref 135–145)

## 2016-08-26 LAB — PROTIME-INR
INR: 2.92
PROTHROMBIN TIME: 31.1 s — AB (ref 11.4–15.2)

## 2016-08-26 MED ORDER — POTASSIUM CHLORIDE CRYS ER 20 MEQ PO TBCR
40.0000 meq | EXTENDED_RELEASE_TABLET | Freq: Four times a day (QID) | ORAL | Status: AC
  Administered 2016-08-26 (×2): 40 meq via ORAL
  Filled 2016-08-26 (×2): qty 2

## 2016-08-26 MED ORDER — FUROSEMIDE 10 MG/ML IJ SOLN
80.0000 mg | Freq: Three times a day (TID) | INTRAMUSCULAR | Status: DC
  Administered 2016-08-26 – 2016-08-28 (×6): 80 mg via INTRAVENOUS
  Filled 2016-08-26 (×6): qty 8

## 2016-08-26 MED ORDER — FUROSEMIDE 10 MG/ML IJ SOLN: 80.0000 mg | Freq: Four times a day (QID) | INTRAMUSCULAR | Status: DC

## 2016-08-26 MED ORDER — METOLAZONE 5 MG PO TABS
5.0000 mg | ORAL_TABLET | Freq: Once | ORAL | Status: AC
  Administered 2016-08-26: 5 mg via ORAL
  Filled 2016-08-26: qty 1

## 2016-08-26 MED ORDER — WARFARIN SODIUM 5 MG PO TABS
5.0000 mg | ORAL_TABLET | Freq: Once | ORAL | Status: AC
  Administered 2016-08-26: 5 mg via ORAL
  Filled 2016-08-26: qty 1

## 2016-08-26 NOTE — Progress Notes (Addendum)
PROGRESS NOTE                                                                                                                                                                                                             Patient Demographics:    [name], is a 83 y.o. female, DOB - [birth year], WOE:[id number]  Admit date - 08/25/2016   Admitting Physician Albertine Patricia, MD  Outpatient Primary MD for the patient is [name], MD  LOS - 1   Chief Complaint  Patient presents with  . Tachycardia       Brief Narrative   83 y.o. female, with history of chronic atrial fib on Coumadin, CAD (RCA stent 2003 with 60% LCx, 60% OM1, 80% OM2 disease at that time), h/o recurrent GIB (prior gastric ulcer/diverticulosis/AVM in 2017, gastric AVM s/p endoscopic therapy in 05/2016, recent recurrent GIB 06/2016), lymphedema, anemia, bilateral renal artery stenosis with bilateral stenting in 2004, carotid artery disease s/p R CEA, HTN, HLD, obesity, prior tobacco abuse, venous insufficiency, chronic diastolic CHF who presents to ED with complaints of palpitation, lightheadedness, Presents with generalized weakness, she was admitted for significant volume overload, acute on chronic CHF.   Subjective:    [name] today Denies any complaints, no chest pain, no shortness of breath, no abdominal pain, no cough, no fever .   Assessment  & Plan :    Active Problems:   Chronic atrial fibrillation (HCC)   Chronic anticoagulation   Hypertension   Hyperlipidemia   Acute renal failure superimposed on stage 3 chronic kidney disease (HCC)   CHF exacerbation (HCC)    Acute on chronic CHF - Presents with worsening edema up to mid abdomen , JVD distention, worsening dyspnea,CT chest with cardiomegaly with distended IVC and small bilateral pleural effusion. - Continue with current dose IV diuresis 80 mg IV every 8 hours, she is only -7 60 mL since admission,  will give one dose of metolazone today, and repeat her potassium, continue with daily weights, strict ins and outs. - Follow on 2-D echo - She is on beta blockers, could not tolerate losartan in the past secondary to hypotension, while she needed titration of her heartrate controlling medications.  AKI on CK D stage III - Creatinine 1.47 on admission, monitor closely given she received IV contrast, as well she  is on IV diuresis, avoid nephrotoxic medication - Appears to be improving, creatinine is 1.19 today.  Hypokalemia - Secondary to diuresis, will give extra potassium today, will check magnesium level in a.m.Marland Kitchen  Chronic atrial fibrillation - CHADSVASC is 6, he is on warfarin with therapeutic INR, continue with warfarin, pharmacy to dose, continue with bisoprolol, heart rate acceptable.  Chronic blood loss anemia - Continue the iron supplement, she is on IV Feraheme as well on weekly basis as an outpatient, hemoglobin is 7.8 today, there is no evidence of significant GI bleed, as with known history of multiple AVMs in small intestine and stomach, monitor closely , so far no indication for transfusion or to hold anticoagulation .  CAD - Denies any chest pain, no recent angina, she is not on aspirin due to risk of bleeding with concomitant warfarin  Hypertension - Blood pressure acceptable, continue with bisoprolol     Code Status : DNR  Family Communication  : None at bedside  Disposition Plan  : Home when stable  Consults  :  none  Procedures  : None  DVT Prophylaxis  :  On warfarin  Lab Results  Component Value Date   PLT 161 08/26/2016    Antibiotics  :   Anti-infectives    None        Objective:   Vitals:   08/25/16 1540 08/25/16 2000 08/25/16 2015 08/26/16 0618  BP: (!) 117/47 (!) 104/42  (!) 110/47  Pulse: (!) 102 88  81  Resp:  (!) 22  18  Temp: 98.6 F (37 C) 98.1 F (36.7 C)  97.6 F (36.4 C)  TempSrc: Oral Oral  Oral  SpO2: 94% 91% 96%  92%  Weight: 83.4 kg (183 lb 12.8 oz)   83 kg (183 lb)  Height: 5\' 2"  (1.575 m)       Wt Readings from Last 3 Encounters:  08/26/16 83 kg (183 lb)  07/03/16 73 kg (161 lb)  06/22/16 82.1 kg (181 lb)     Intake/Output Summary (Last 24 hours) at 08/26/16 0907 Last data filed at 08/26/16 1950  Gross per 24 hour  Intake              240 ml  Output             1000 ml  Net             -760 ml     Physical Exam  Awake Alert, Oriented X 3, No new F.N deficits, Normal affect mild JVD  Symmetrical Chest wall movement, Good air movement bilaterally, CTAB irr irr,No Gallops,Rubs or new Murmurs, No Parasternal Heave +ve B.Sounds, Abd Soft, No tenderness,, No rebound - guarding or rigidity. No Cyanosis, Clubbing , with significant lymphedema/+4 edema all the way up to lower abdomen .    Data Review:    CBC  Recent Labs Lab 08/21/16 1105 08/25/16 0922 08/26/16 0557  WBC  --  10.9* 10.1  HGB 8.1* 8.9* 7.8*  HCT 27.8* 30.0* 26.7*  PLT  --  188 161  MCV  --  76.7* 77.4*  MCH  --  22.8* 22.6*  MCHC  --  29.7* 29.2*  RDW  --  17.7* 17.6*  LYMPHSABS  --  0.7  --   MONOABS  --  1.0  --   EOSABS  --  0.1  --   BASOSABS  --  0.1  --     Chemistries   Recent Labs Lab 08/25/16 [phone]  08/26/16 0557  NA 135 136  K 3.5 3.0*  CL 96* 97*  CO2 27 30  GLUCOSE 121* 98  BUN 20 17  CREATININE 1.47* 1.19*  CALCIUM 9.2 9.0  AST 45*  --   ALT 20  --   ALKPHOS 97  --   BILITOT 1.6*  --    ------------------------------------------------------------------------------------------------------------------ No results for input(s): CHOL, HDL, LDLCALC, TRIG, CHOLHDL, LDLDIRECT in the last 72 hours.  Lab Results  Component Value Date   HGBA1C 6.0 (H) 10/02/2012   ------------------------------------------------------------------------------------------------------------------ No results for input(s): TSH, T4TOTAL, T3FREE, THYROIDAB in the last 72 hours.  Invalid input(s):  FREET3 ------------------------------------------------------------------------------------------------------------------ No results for input(s): VITAMINB12, FOLATE, FERRITIN, TIBC, IRON, RETICCTPCT in the last 72 hours.  Coagulation profile  Recent Labs Lab 08/22/16 1039 08/25/16 1027 08/26/16 0557  INR 2.9 3.14 2.92     Recent Labs  08/25/16 1027  DDIMER 1.22*    Cardiac Enzymes  Recent Labs Lab 08/25/16 0922  TROPONINI 0.03*   ------------------------------------------------------------------------------------------------------------------    Component Value Date/Time   BNP 438.0 (H) 08/25/2016 0922   BNP 232.7 (H) 08/30/2011 1141    Inpatient Medications  Scheduled Meds: . atorvastatin  80 mg Oral QHS  . bisoprolol  10 mg Oral BID  . furosemide  80 mg Intravenous Q8H  . metolazone  5 mg Oral Once  . pantoprazole  40 mg Oral BID AC  . potassium chloride SA  20 mEq Oral BID  . potassium chloride  40 mEq Oral Q6H  . sodium chloride flush  3 mL Intravenous Q12H  . terazosin  5 mg Oral QHS  . warfarin  5 mg Oral Once  . Warfarin - Pharmacist Dosing Inpatient   Does not apply Q24H   Continuous Infusions: . sodium chloride     PRN Meds:.sodium chloride, acetaminophen, ondansetron (ZOFRAN) IV, sodium chloride flush  Micro Results No results found for this or any previous visit (from the past 240 hour(s)).  Radiology Reports Dg Chest 2 View  Result Date: 08/25/2016 CLINICAL DATA:  Atrial fibrillation.  Tachycardia. EXAM: CHEST  2 VIEW COMPARISON:  June 17, 2016 FINDINGS: Cardiomegaly persists. The hila and mediastinum are normal. No pneumothorax. No pulmonary nodules, masses, or focal infiltrates. No overt edema is identified. IMPRESSION: No acute abnormalities.  Cardiomegaly. Electronically Signed   By: Dorise Bullion III M.D   On: 08/25/2016 09:58   Ct Angio Chest Pe W Or Wo Contrast  Result Date: 08/25/2016 CLINICAL DATA:  Shortness of breath,  lightheadedness, bilateral leg swelling. History of coronary artery disease, CHF, hypertension, lymphedema, renal artery stenosis with bilateral stents. EXAM: CT ANGIOGRAPHY CHEST WITH CONTRAST TECHNIQUE: Multidetector CT imaging of the chest was performed using the standard protocol during bolus administration of intravenous contrast. Multiplanar CT image reconstructions and MIPs were obtained to evaluate the vascular anatomy. CONTRAST:  80 cc Isovue 370 COMPARISON:  Chest CT angiogram dated 09/02/2009. FINDINGS: Cardiovascular: Some of the most peripheral segmental and subsegmental pulmonary artery branches are difficult to definitively characterize due to patient breathing motion artifact, however, there is no pulmonary embolism identified within the main, lobar or central segmental pulmonary arteries bilaterally. No aortic aneurysm or dissection. Prominent atherosclerotic changes along the walls of the thoracic aorta and upper abdominal aorta. Cardiomegaly. No pericardial effusion. Coronary artery calcifications. Mediastinum/Nodes: No mass or enlarged lymph nodes seen within the mediastinum or perihilar regions. Esophagus is unremarkable. Trachea and central bronchi are unremarkable. Lungs/Pleura: Small bilateral pleural effusions. Mild atelectasis at each lung base.  Lungs otherwise clear. Upper Abdomen: Distention of the IVC suggesting CHF. Small amount of ascites within the upper abdomen. Musculoskeletal: No acute or suspicious osseous finding. Ill-defined edema within the subcutaneous soft tissues of the upper abdomen suggesting anasarca. Review of the MIP images confirms the above findings. IMPRESSION: 1. No pulmonary embolism identified, with mild study limitations detailed above. 2. Cardiomegaly. Diffuse coronary artery calcifications. Distension of the IVC suggesting CHF, but no corresponding pulmonary edema at this point. 3. Small bilateral pleural effusions. 4. Aortic atherosclerosis.  No aortic  aneurysm or dissection. 5. Small amount of ascites seen within the upper abdomen. 6. Presumed anasarca within the subcutaneous soft tissues of the upper abdomen, further indication of volume overload. Electronically Signed   By: Franki Cabot M.D.   On: 08/25/2016 12:55     Gaynell Eggleton M.D on 08/26/2016 at 9:07 AM  Between 7am to 7pm - Pager - [phone]  After 7pm go to [email or web address] - password [facility]  [facility] -  Office  [phone]

## 2016-08-26 NOTE — Progress Notes (Signed)
*  PRELIMINARY RESULTS* Echocardiogram 2D Echocardiogram has been performed.  [name] 08/26/2016, 10:25 AM

## 2016-08-26 NOTE — Progress Notes (Signed)
ANTICOAGULATION CONSULT NOTE -  Pharmacy Consult for Warfarin Indication: atrial fibrillation  Allergies  Allergen Reactions  . Diltiazem Other (See Comments)    Edema  . Verapamil Other (See Comments)    Diarrhea    Patient Measurements: Height: 5\' 2"  (157.5 cm) Weight: 183 lb (83 kg) IBW/kg (Calculated) : 50.1 Heparin Dosing Weight:   Vital Signs: Temp: 97.6 F (36.4 C) (05/13 0618) Temp Source: Oral (05/13 0618) BP: 110/47 (05/13 0618) Pulse Rate: 81 (05/13 0618)  Labs:  Recent Labs  08/25/16 0922 08/25/16 1027 08/26/16 0557  HGB 8.9*  --  7.8*  HCT 30.0*  --  26.7*  PLT 188  --  161  LABPROT  --  33.0* 31.1*  INR  --  3.14 2.92  CREATININE 1.47*  --  1.19*  TROPONINI 0.03*  --   --     Estimated Creatinine Clearance: 37.1 mL/min (A) (by C-G formula based on SCr of 1.19 mg/dL (H)).   Medical History: Past Medical History:  Diagnosis Date  . Carotid artery disease ([place])    RIght CEA 05/2004  . Chronic anticoagulation   . Chronic atrial fibrillation ([place])   . Chronic blood loss anemia   . Chronic diastolic CHF (congestive heart failure) ([place])   . Coronary atherosclerosis of native coronary artery    a. RCA stent 2003 with 60% LCx, 60% OM1, 80% OM2 disease at that time.  . Essential hypertension, benign   . GI bleeding    Recurrent GIB (prior gastric ulcer/diverticulosis/AVM in 2017, gastric AVM s/p endoscopic therapy in 05/2016, recurrent GIB 06/2016 with AVM s/p therapy on endoscopy.  . Hyperlipidemia   . Lymphedema    a. in general, her LE edema appears to be a poor indicator of her total fluid status - chronic edema.  . Obesity   . Renal artery stenosis (HCC)    Bilateral renal artery stents 2004  . Tobacco abuse, in remission    40 pack year stopped in 1989    Medications:  Prescriptions Prior to Admission  Medication Sig Dispense Refill Last Dose  . atorvastatin (LIPITOR) 80 MG tablet TAKE 1 TABLET EVERY NIGHT AT BEDTIME 60 tablet 6 08/24/2016  at Unknown time  . bisoprolol (ZEBETA) 10 MG tablet take 1 tablet by mouth twice a day 60 tablet 6 08/24/2016 at 1730  . pantoprazole (PROTONIX) 40 MG tablet Take 1 tablet (40 mg total) by mouth 2 (two) times daily before a meal. 60 tablet 0 08/24/2016 at Unknown time  . Pediatric Multivitamins-Iron (FLINTSTONES PLUS IRON PO) Take by mouth 2 (two) times daily.   08/24/2016 at Unknown time  . potassium chloride SA (K-DUR,KLOR-CON) 20 MEQ tablet Take 1 tablet (20 mEq total) by mouth 2 (two) times daily. 180 tablet 3 08/24/2016 at Unknown time  . terazosin (HYTRIN) 5 MG capsule take 1 capsule by mouth at bedtime 90 capsule 3 08/24/2016 at Unknown time  . torsemide (DEMADEX) 20 MG tablet Take 4 tablets (80 mg total) by mouth 2 (two) times daily. 360 tablet 3 08/24/2016 at Unknown time  . warfarin (COUMADIN) 5 MG tablet Take 1 tablet daily except 1/2 tablet on Tuesdays and Saturdays or as directed by [facility] Clinic 40 tablet 4 08/24/2016 at Unknown time    Assessment: Continuation of warfarin PTA for AFIB INR therapeutic this morning.  Goal of Therapy:  INR 2-3 Monitor platelets by anticoagulation protocol: Yes   Plan:  Coumadin 5 mg po today x 1 dose, home regiment INR/PT daily  Monitor for signs of bleeding  [name], [name] 08/26/2016,8:43 AM

## 2016-08-27 LAB — BASIC METABOLIC PANEL
Anion gap: 11 (ref 5–15)
BUN: 18 mg/dL (ref 6–20)
CALCIUM: 9.1 mg/dL (ref 8.9–10.3)
CO2: 31 mmol/L (ref 22–32)
Chloride: 93 mmol/L — ABNORMAL LOW (ref 101–111)
Creatinine, Ser: 1.19 mg/dL — ABNORMAL HIGH (ref 0.44–1.00)
GFR, EST AFRICAN AMERICAN: 48 mL/min — AB (ref >60)
GFR, EST NON AFRICAN AMERICAN: 42 mL/min — AB (ref >60)
Glucose, Bld: 110 mg/dL — ABNORMAL HIGH (ref 65–99)
Potassium: 3.1 mmol/L — ABNORMAL LOW (ref 3.5–5.1)
Sodium: 135 mmol/L (ref 135–145)

## 2016-08-27 LAB — MAGNESIUM: MAGNESIUM: 1.9 mg/dL (ref 1.7–2.4)

## 2016-08-27 LAB — CBC
HCT: 28.5 % — ABNORMAL LOW (ref 36.0–46.0)
Hemoglobin: 8.2 g/dL — ABNORMAL LOW (ref 12.0–15.0)
MCH: 22.8 pg — ABNORMAL LOW (ref 26.0–34.0)
MCHC: 28.8 g/dL — ABNORMAL LOW (ref 30.0–36.0)
MCV: 79.2 fL (ref 78.0–100.0)
PLATELETS: 145 10*3/uL — AB (ref 150–400)
RBC: 3.6 MIL/uL — ABNORMAL LOW (ref 3.87–5.11)
RDW: 19.5 % — AB (ref 11.5–15.5)
WBC: 8.4 10*3/uL (ref 4.0–10.5)

## 2016-08-27 LAB — PROTIME-INR
INR: 2.16
Prothrombin Time: 24.5 seconds — ABNORMAL HIGH (ref 11.4–15.2)

## 2016-08-27 MED ORDER — POTASSIUM CHLORIDE CRYS ER 20 MEQ PO TBCR
40.0000 meq | EXTENDED_RELEASE_TABLET | ORAL | Status: AC
  Administered 2016-08-27 (×3): 40 meq via ORAL
  Filled 2016-08-27 (×3): qty 2

## 2016-08-27 MED ORDER — METOLAZONE 5 MG PO TABS
2.5000 mg | ORAL_TABLET | Freq: Once | ORAL | Status: AC
  Administered 2016-08-27: 2.5 mg via ORAL
  Filled 2016-08-27: qty 1

## 2016-08-27 MED ORDER — POTASSIUM CHLORIDE CRYS ER 20 MEQ PO TBCR
20.0000 meq | EXTENDED_RELEASE_TABLET | Freq: Every day | ORAL | Status: DC
  Administered 2016-08-28: 20 meq via ORAL
  Filled 2016-08-27: qty 1

## 2016-08-27 MED ORDER — ORAL CARE MOUTH RINSE
15.0000 mL | Freq: Two times a day (BID) | OROMUCOSAL | Status: DC
  Administered 2016-08-27 – 2016-08-28 (×3): 15 mL via OROMUCOSAL

## 2016-08-27 MED ORDER — BISACODYL 5 MG PO TBEC: 5.0000 mg | DELAYED_RELEASE_TABLET | Freq: Every day | ORAL | Status: DC | PRN

## 2016-08-27 MED ORDER — WARFARIN SODIUM 5 MG PO TABS
5.0000 mg | ORAL_TABLET | Freq: Once | ORAL | Status: AC
  Administered 2016-08-27: 5 mg via ORAL
  Filled 2016-08-27: qty 1

## 2016-08-27 MED ORDER — BISACODYL 10 MG RE SUPP: 10.0000 mg | Freq: Every day | RECTAL | Status: DC | PRN

## 2016-08-27 NOTE — Plan of Care (Signed)
Problem: Safety: Goal: Ability to remain free from injury will improve Outcome: Progressing Pt up walking in room and hallway with RN. Pt c/o weakness, steady gait. Pt states she doesn't need a PT consult.

## 2016-08-27 NOTE — Progress Notes (Signed)
PROGRESS NOTE                                                                                                                                                                                                             Patient Demographics:    [name], is a 83 y.o. female, DOB - [birth year], BOF:[id number]  Admit date - 08/25/2016   Admitting Physician Peggy Patricia, MD  Outpatient Primary MD for the patient is Peggy Sites, MD  LOS - 2   Chief Complaint  Patient presents with  . Tachycardia       Brief Narrative   83 y.o. female, with history of chronic atrial fib on Coumadin, CAD (RCA stent 2003 with 60% LCx, 60% OM1, 80% OM2 disease at that time), h/o recurrent GIB (prior gastric ulcer/diverticulosis/AVM in 2017, gastric AVM s/p endoscopic therapy in 05/2016, recent recurrent GIB 06/2016), lymphedema, anemia, bilateral renal artery stenosis with bilateral stenting in 2004, carotid artery disease s/p R CEA, HTN, HLD, obesity, prior tobacco abuse, venous insufficiency, chronic diastolic CHF who presents to ED with complaints of palpitation, lightheadedness, Presents with generalized weakness, she was admitted for significant volume overload, acute on chronic CHF.   Subjective:    [name] today Denies any complaints, no chest pain, no shortness of breath, no abdominal pain, no cough or fever, reports no bowel movement since admission .   Assessment  & Plan :    Active Problems:   Chronic atrial fibrillation (HCC)   Chronic anticoagulation   Hypertension   Hyperlipidemia   Acute renal failure superimposed on stage 3 chronic kidney disease (HCC)   CHF exacerbation (HCC)    Acute on chronic CHF - Presents with worsening edema up to mid abdomen , JVD distention, worsening dyspnea,CT chest with cardiomegaly with distended IVC and small bilateral pleural effusion. - Continue with current dose IV diuresis, Lasix 80 mg IV every  8 hours, she did receive 1 dose of metolazone yesterday, but didn't output, -4.3 L since admission, weight 183> 174, will give extra dose of metolazone today as well, to be potassium aggressively, check magnesium. - 2-D echo with preserved EF 60%, given  A. fib unable to evaluate for diastolic dysfunction. - She is on beta blockers, could not tolerate losartan in the past secondary to hypotension, while she needed titration of her  heartrate controlling medications.  AKI on CK D stage III - Creatinine 1.47 on admission, monitor closely given she received IV contrast, as well she is on IV diuresis, avoid nephrotoxic medication - Appears to be improving, creatinine is stable at 1.19 today.  Hypokalemia - Secondary to diuresis, continue to replete, magnesium 1.9 today   Chronic atrial fibrillation - CHADSVASC is 6, he is on warfarin with therapeutic INR, continue with warfarin, pharmacy to dose, continue with bisoprolol, heart rate acceptable.  Chronic blood loss anemia - Continue the iron supplement, she is on IV Feraheme as well on weekly basis as an outpatient, hemoglobin is stable at 8.2 today.  CAD - Denies any chest pain, no recent angina, she is not on aspirin due to risk of bleeding with concomitant warfarin  Hypertension - Blood pressure acceptable, continue with bisoprolol     Code Status : DNR  Family Communication  : Discussed with son at bedside  Disposition Plan  : Home when stable  Consults  :  none  Procedures  : None  DVT Prophylaxis  :  On warfarin  Lab Results  Component Value Date   PLT 145 (L) 08/27/2016    Antibiotics  :   Anti-infectives    None        Objective:   Vitals:   08/26/16 1500 08/26/16 2211 08/27/16 0638 08/27/16 0701  BP: (!) 105/50 (!) 104/50 103/72   Pulse: (!) 102 98 84   Resp: 16 16 16    Temp: 98.6 F (37 C) 98.9 F (37.2 C) 97.5 F (36.4 C)   TempSrc: Oral Oral Oral   SpO2: 97% 97% 96%   Weight:    79.3 kg (174  lb 12.8 oz)  Height:        Wt Readings from Last 3 Encounters:  08/27/16 79.3 kg (174 lb 12.8 oz)  07/03/16 73 kg (161 lb)  06/22/16 82.1 kg (181 lb)     Intake/Output Summary (Last 24 hours) at 08/27/16 1056 Last data filed at 08/27/16 1051  Gross per 24 hour  Intake              728 ml  Output             3950 ml  Net            -3222 ml     Physical Exam  Weight: 3, normal affect, no focal deficits  No JVD  Good air entry bilaterally, CTA B/L irr irr,No Gallops,Rubs or new Murmurs, No Parasternal Heave +ve B.Sounds, Abd Soft, No tenderness,, No rebound - guarding or rigidity. No Cyanosis, Clubbing , with significant lymphedema, edema  abdominal wall is improving, but still with significant +4 edema to upper thighs .    Data Review:    CBC  Recent Labs Lab 08/21/16 1105 08/25/16 0922 08/26/16 0557 08/27/16 0632  WBC  --  10.9* 10.1 8.4  HGB 8.1* 8.9* 7.8* 8.2*  HCT 27.8* 30.0* 26.7* 28.5*  PLT  --  188 161 145*  MCV  --  76.7* 77.4* 79.2  MCH  --  22.8* 22.6* 22.8*  MCHC  --  29.7* 29.2* 28.8*  RDW  --  17.7* 17.6* 19.5*  LYMPHSABS  --  0.7  --   --   MONOABS  --  1.0  --   --   EOSABS  --  0.1  --   --   BASOSABS  --  0.1  --   --  Chemistries   Recent Labs Lab 08/25/16 0922 08/26/16 0557 08/27/16 0632  NA 135 136 135  K 3.5 3.0* 3.1*  CL 96* 97* 93*  CO2 27 30 31   GLUCOSE 121* 98 110*  BUN 20 17 18   CREATININE 1.47* 1.19* 1.19*  CALCIUM 9.2 9.0 9.1  MG  --   --  1.9  AST 45*  --   --   ALT 20  --   --   ALKPHOS 97  --   --   BILITOT 1.6*  --   --    ------------------------------------------------------------------------------------------------------------------ No results for input(s): CHOL, HDL, LDLCALC, TRIG, CHOLHDL, LDLDIRECT in the last 72 hours.  Lab Results  Component Value Date   HGBA1C 6.0 (H) 10/02/2012    ------------------------------------------------------------------------------------------------------------------ No results for input(s): TSH, T4TOTAL, T3FREE, THYROIDAB in the last 72 hours.  Invalid input(s): FREET3 ------------------------------------------------------------------------------------------------------------------ No results for input(s): VITAMINB12, FOLATE, FERRITIN, TIBC, IRON, RETICCTPCT in the last 72 hours.  Coagulation profile  Recent Labs Lab 08/22/16 1039 08/25/16 1027 08/26/16 0557 08/27/16 0632  INR 2.9 3.14 2.92 2.16     Recent Labs  08/25/16 1027  DDIMER 1.22*    Cardiac Enzymes  Recent Labs Lab 08/25/16 0922  TROPONINI 0.03*   ------------------------------------------------------------------------------------------------------------------    Component Value Date/Time   BNP 438.0 (H) 08/25/2016 0922   BNP 232.7 (H) 08/30/2011 1141    Inpatient Medications  Scheduled Meds: . atorvastatin  80 mg Oral QHS  . bisoprolol  10 mg Oral BID  . furosemide  80 mg Intravenous Q8H  . mouth rinse  15 mL Mouth Rinse BID  . metolazone  2.5 mg Oral Once  . pantoprazole  40 mg Oral BID AC  . potassium chloride SA  20 mEq Oral BID  . potassium chloride  40 mEq Oral Q4H  . sodium chloride flush  3 mL Intravenous Q12H  . terazosin  5 mg Oral QHS  . warfarin  5 mg Oral Once  . Warfarin - Pharmacist Dosing Inpatient   Does not apply Q24H   Continuous Infusions: . sodium chloride     PRN Meds:.sodium chloride, acetaminophen, bisacodyl, bisacodyl, ondansetron (ZOFRAN) IV, sodium chloride flush  Micro Results No results found for this or any previous visit (from the past 240 hour(s)).  Radiology Reports Dg Chest 2 View  Result Date: 08/25/2016 CLINICAL DATA:  Atrial fibrillation.  Tachycardia. EXAM: CHEST  2 VIEW COMPARISON:  June 17, 2016 FINDINGS: Cardiomegaly persists. The hila and mediastinum are normal. No pneumothorax. No pulmonary  nodules, masses, or focal infiltrates. No overt edema is identified. IMPRESSION: No acute abnormalities.  Cardiomegaly. Electronically Signed   By: Dorise Bullion III M.D   On: 08/25/2016 09:58   Ct Angio Chest Pe W Or Wo Contrast  Result Date: 08/25/2016 CLINICAL DATA:  Shortness of breath, lightheadedness, bilateral leg swelling. History of coronary artery disease, CHF, hypertension, lymphedema, renal artery stenosis with bilateral stents. EXAM: CT ANGIOGRAPHY CHEST WITH CONTRAST TECHNIQUE: Multidetector CT imaging of the chest was performed using the standard protocol during bolus administration of intravenous contrast. Multiplanar CT image reconstructions and MIPs were obtained to evaluate the vascular anatomy. CONTRAST:  80 cc Isovue 370 COMPARISON:  Chest CT angiogram dated 09/02/2009. FINDINGS: Cardiovascular: Some of the most peripheral segmental and subsegmental pulmonary artery branches are difficult to definitively characterize due to patient breathing motion artifact, however, there is no pulmonary embolism identified within the main, lobar or central segmental pulmonary arteries bilaterally. No aortic aneurysm or dissection. Prominent  atherosclerotic changes along the walls of the thoracic aorta and upper abdominal aorta. Cardiomegaly. No pericardial effusion. Coronary artery calcifications. Mediastinum/Nodes: No mass or enlarged lymph nodes seen within the mediastinum or perihilar regions. Esophagus is unremarkable. Trachea and central bronchi are unremarkable. Lungs/Pleura: Small bilateral pleural effusions. Mild atelectasis at each lung base. Lungs otherwise clear. Upper Abdomen: Distention of the IVC suggesting CHF. Small amount of ascites within the upper abdomen. Musculoskeletal: No acute or suspicious osseous finding. Ill-defined edema within the subcutaneous soft tissues of the upper abdomen suggesting anasarca. Review of the MIP images confirms the above findings. IMPRESSION: 1. No  pulmonary embolism identified, with mild study limitations detailed above. 2. Cardiomegaly. Diffuse coronary artery calcifications. Distension of the IVC suggesting CHF, but no corresponding pulmonary edema at this point. 3. Small bilateral pleural effusions. 4. Aortic atherosclerosis.  No aortic aneurysm or dissection. 5. Small amount of ascites seen within the upper abdomen. 6. Presumed anasarca within the subcutaneous soft tissues of the upper abdomen, further indication of volume overload. Electronically Signed   By: Franki Cabot M.D.   On: 08/25/2016 12:55     Henrik Orihuela M.D on 08/27/2016 at 10:56 AM  Between 7am to 7pm - Pager - [phone]  After 7pm go to [email or web address] - password [facility]  [facility] -  Office  [phone]

## 2016-08-27 NOTE — Progress Notes (Signed)
ANTICOAGULATION CONSULT NOTE -  Pharmacy Consult for Warfarin Indication: atrial fibrillation  Allergies  Allergen Reactions  . Diltiazem Other (See Comments)    Edema  . Verapamil Other (See Comments)    Diarrhea    Patient Measurements: Height: 5\' 2"  (157.5 cm) Weight: 174 lb 12.8 oz (79.3 kg) IBW/kg (Calculated) : 50.1 Heparin Dosing Weight:   Vital Signs: Temp: 97.5 F (36.4 C) (05/14 0638) Temp Source: Oral (05/14 5009) BP: 103/72 (05/14 3818) Pulse Rate: 84 (05/14 0638)  Labs:  Recent Labs  08/25/16 0922 08/25/16 1027 08/26/16 0557 08/27/16 0632  HGB 8.9*  --  7.8* 8.2*  HCT 30.0*  --  26.7* 28.5*  PLT 188  --  161 145*  LABPROT  --  33.0* 31.1* 24.5*  INR  --  3.14 2.92 2.16  CREATININE 1.47*  --  1.19* 1.19*  TROPONINI 0.03*  --   --   --     Estimated Creatinine Clearance: 36.2 mL/min (A) (by C-G formula based on SCr of 1.19 mg/dL (H)).   Medical History: Past Medical History:  Diagnosis Date  . Carotid artery disease ([place])    RIght CEA 05/2004  . Chronic anticoagulation   . Chronic atrial fibrillation ([place])   . Chronic blood loss anemia   . Chronic diastolic CHF (congestive heart failure) ([place])   . Coronary atherosclerosis of native coronary artery    a. RCA stent 2003 with 60% LCx, 60% OM1, 80% OM2 disease at that time.  . Essential hypertension, benign   . GI bleeding    Recurrent GIB (prior gastric ulcer/diverticulosis/AVM in 2017, gastric AVM s/p endoscopic therapy in 05/2016, recurrent GIB 06/2016 with AVM s/p therapy on endoscopy.  . Hyperlipidemia   . Lymphedema    a. in general, her LE edema appears to be a poor indicator of her total fluid status - chronic edema.  . Obesity   . Renal artery stenosis (HCC)    Bilateral renal artery stents 2004  . Tobacco abuse, in remission    40 pack year stopped in 1989    Medications:  Prescriptions Prior to Admission  Medication Sig Dispense Refill Last Dose  . atorvastatin (LIPITOR) 80 MG  tablet TAKE 1 TABLET EVERY NIGHT AT BEDTIME 60 tablet 6 08/24/2016 at Unknown time  . bisoprolol (ZEBETA) 10 MG tablet take 1 tablet by mouth twice a day 60 tablet 6 08/24/2016 at 1730  . pantoprazole (PROTONIX) 40 MG tablet Take 1 tablet (40 mg total) by mouth 2 (two) times daily before a meal. 60 tablet 0 08/24/2016 at Unknown time  . Pediatric Multivitamins-Iron (FLINTSTONES PLUS IRON PO) Take by mouth 2 (two) times daily.   08/24/2016 at Unknown time  . potassium chloride SA (K-DUR,KLOR-CON) 20 MEQ tablet Take 1 tablet (20 mEq total) by mouth 2 (two) times daily. 180 tablet 3 08/24/2016 at Unknown time  . terazosin (HYTRIN) 5 MG capsule take 1 capsule by mouth at bedtime 90 capsule 3 08/24/2016 at Unknown time  . torsemide (DEMADEX) 20 MG tablet Take 4 tablets (80 mg total) by mouth 2 (two) times daily. 360 tablet 3 08/24/2016 at Unknown time  . warfarin (COUMADIN) 5 MG tablet Take 1 tablet daily except 1/2 tablet on Tuesdays and Saturdays or as directed by Coumadin Clinic 40 tablet 4 08/24/2016 at Unknown time    Assessment: Continuation of warfarin PTA for AFIB INR therapeutic this morning.  Goal of Therapy:  INR 2-3 Monitor platelets by anticoagulation protocol: Yes   Plan:  Coumadin 5 mg po today x 1 dose INR/PT daily Monitor for signs of bleeding  Nikolette Reindl Poteet 08/27/2016,8:31 AM

## 2016-08-28 DIAGNOSIS — D500 Iron deficiency anemia secondary to blood loss (chronic): Secondary | ICD-10-CM

## 2016-08-28 LAB — CBC
HCT: 27.5 % — ABNORMAL LOW (ref 36.0–46.0)
Hemoglobin: 7.8 g/dL — ABNORMAL LOW (ref 12.0–15.0)
MCH: 22.7 pg — AB (ref 26.0–34.0)
MCHC: 28.4 g/dL — AB (ref 30.0–36.0)
MCV: 80.2 fL (ref 78.0–100.0)
PLATELETS: 134 10*3/uL — AB (ref 150–400)
RBC: 3.43 MIL/uL — ABNORMAL LOW (ref 3.87–5.11)
RDW: 20.9 % — AB (ref 11.5–15.5)
WBC: 8.3 10*3/uL (ref 4.0–10.5)

## 2016-08-28 LAB — BASIC METABOLIC PANEL
Anion gap: 12 (ref 5–15)
BUN: 19 mg/dL (ref 6–20)
CALCIUM: 9.2 mg/dL (ref 8.9–10.3)
CO2: 33 mmol/L — ABNORMAL HIGH (ref 22–32)
CREATININE: 1.39 mg/dL — AB (ref 0.44–1.00)
Chloride: 92 mmol/L — ABNORMAL LOW (ref 101–111)
GFR, EST AFRICAN AMERICAN: 40 mL/min — AB (ref >60)
GFR, EST NON AFRICAN AMERICAN: 35 mL/min — AB (ref >60)
Glucose, Bld: 99 mg/dL (ref 65–99)
Potassium: 3 mmol/L — ABNORMAL LOW (ref 3.5–5.1)
SODIUM: 137 mmol/L (ref 135–145)

## 2016-08-28 LAB — PROTIME-INR
INR: 1.99
PROTHROMBIN TIME: 22.9 s — AB (ref 11.4–15.2)

## 2016-08-28 LAB — PREPARE RBC (CROSSMATCH)

## 2016-08-28 MED ORDER — POTASSIUM CHLORIDE CRYS ER 20 MEQ PO TBCR: EXTENDED_RELEASE_TABLET | ORAL | 0 refills | Status: DC

## 2016-08-28 MED ORDER — SODIUM CHLORIDE 0.9 % IV SOLN
Freq: Once | INTRAVENOUS | Status: AC
  Administered 2016-08-28: 14:00:00 via INTRAVENOUS

## 2016-08-28 MED ORDER — WARFARIN SODIUM 5 MG PO TABS
5.0000 mg | ORAL_TABLET | Freq: Once | ORAL | Status: AC
  Administered 2016-08-28: 5 mg via ORAL
  Filled 2016-08-28: qty 1

## 2016-08-28 MED ORDER — METOLAZONE 2.5 MG PO TABS: 2.5000 mg | ORAL_TABLET | ORAL | 0 refills | Status: DC

## 2016-08-28 MED ORDER — POTASSIUM CHLORIDE CRYS ER 20 MEQ PO TBCR
40.0000 meq | EXTENDED_RELEASE_TABLET | ORAL | Status: AC
  Administered 2016-08-28 (×2): 40 meq via ORAL
  Filled 2016-08-28 (×2): qty 2

## 2016-08-28 NOTE — Consult Note (Signed)
   [facility] CM Inpatient Consult   08/28/2016  [name] 11/16/1932 [id number]   Patient screened for potential [place] [facility] services. Patient is eligible for [place]. Electronic medical record reveals inpatient case manager has assessed needs and has referred patient to [facility] EMMI calls for CHF.  If patient's post hospital needs change please place a [facility] Care Management consult. For questions please contact:   Zebulan Hinshaw RN, [facility] Liaison  [phone]) Business Mobile [phone]) Toll free office

## 2016-08-28 NOTE — Progress Notes (Signed)
ANTICOAGULATION CONSULT NOTE -  Pharmacy Consult for Warfarin Indication: atrial fibrillation  Allergies  Allergen Reactions  . Diltiazem Other (See Comments)    Edema  . Verapamil Other (See Comments)    Diarrhea    Patient Measurements: Height: 5\' 2"  (157.5 cm) Weight: 169 lb 12.8 oz (77 kg) IBW/kg (Calculated) : 50.1 Heparin Dosing Weight:   Vital Signs: Temp: 97.7 F (36.5 C) (05/15 0600) Temp Source: Oral (05/15 0600) BP: 126/64 (05/15 0600) Pulse Rate: 81 (05/15 0600)  Labs:  Recent Labs  08/25/16 0922  08/26/16 0557 08/27/16 0632 08/28/16 0414  HGB 8.9*  --  7.8* 8.2* 7.8*  HCT 30.0*  --  26.7* 28.5* 27.5*  PLT 188  --  161 145* 134*  LABPROT  --   < > 31.1* 24.5* 22.9*  INR  --   < > 2.92 2.16 1.99  CREATININE 1.47*  --  1.19* 1.19* 1.39*  TROPONINI 0.03*  --   --   --   --   < > = values in this interval not displayed.  Estimated Creatinine Clearance: 30.5 mL/min (A) (by C-G formula based on SCr of 1.39 mg/dL (H)).   Medical History: Past Medical History:  Diagnosis Date  . Carotid artery disease ([place])    RIght CEA 05/2004  . Chronic anticoagulation   . Chronic atrial fibrillation ([place])   . Chronic blood loss anemia   . Chronic diastolic CHF (congestive heart failure) ([place])   . Coronary atherosclerosis of native coronary artery    a. RCA stent 2003 with 60% LCx, 60% OM1, 80% OM2 disease at that time.  . Essential hypertension, benign   . GI bleeding    Recurrent GIB (prior gastric ulcer/diverticulosis/AVM in 2017, gastric AVM s/p endoscopic therapy in 05/2016, recurrent GIB 06/2016 with AVM s/p therapy on endoscopy.  . Hyperlipidemia   . Lymphedema    a. in general, her LE edema appears to be a poor indicator of her total fluid status - chronic edema.  . Obesity   . Renal artery stenosis (HCC)    Bilateral renal artery stents 2004  . Tobacco abuse, in remission    40 pack year stopped in 1989    Medications:  Prescriptions Prior to  Admission  Medication Sig Dispense Refill Last Dose  . atorvastatin (LIPITOR) 80 MG tablet TAKE 1 TABLET EVERY NIGHT AT BEDTIME 60 tablet 6 08/24/2016 at Unknown time  . bisoprolol (ZEBETA) 10 MG tablet take 1 tablet by mouth twice a day 60 tablet 6 08/24/2016 at 1730  . pantoprazole (PROTONIX) 40 MG tablet Take 1 tablet (40 mg total) by mouth 2 (two) times daily before a meal. 60 tablet 0 08/24/2016 at Unknown time  . Pediatric Multivitamins-Iron (FLINTSTONES PLUS IRON PO) Take by mouth 2 (two) times daily.   08/24/2016 at Unknown time  . potassium chloride SA (K-DUR,KLOR-CON) 20 MEQ tablet Take 1 tablet (20 mEq total) by mouth 2 (two) times daily. 180 tablet 3 08/24/2016 at Unknown time  . terazosin (HYTRIN) 5 MG capsule take 1 capsule by mouth at bedtime 90 capsule 3 08/24/2016 at Unknown time  . torsemide (DEMADEX) 20 MG tablet Take 4 tablets (80 mg total) by mouth 2 (two) times daily. 360 tablet 3 08/24/2016 at Unknown time  . warfarin (COUMADIN) 5 MG tablet Take 1 tablet daily except 1/2 tablet on Tuesdays and Saturdays or as directed by Coumadin Clinic 40 tablet 4 08/24/2016 at Unknown time    Assessment: Continuation of warfarin PTA  for AFIB INR slightly subtherapeutic this am Hg low but stable  Goal of Therapy:  INR 2-3 Monitor platelets by anticoagulation protocol: Yes   Plan:  Coumadin 5 mg po today x 1 dose INR/PT daily Monitor for signs of bleeding  Demika Langenderfer Poteet 08/28/2016,8:32 AM

## 2016-08-28 NOTE — Care Management Note (Addendum)
Case Management Note  Patient Details  Name: [name] MRN: [id number] Date of Birth: 11/03/1932  Subjective/Objective:                  Pt admitted with CHF exacerbation. She is from home, lives alone. She is ind with ADL's, drives, has PCP she sees regularly and has insurance with drug coverage. She has four children so assist if needed. She has no HH or DME needs. She communicates that she has no needs at DC.   Action/Plan: Plan for return home with self care today. Pt is on [place] registry and will be referred for enrollment in [facility] [facility] calls for CHF.   Expected Discharge Date:      08/28/2016            Expected Discharge Plan:  Home/Self Care  In-House Referral:  NA  Discharge planning Services  CM Consult  Post Acute Care Choice:  NA Choice offered to:  NA  Status of Service:  Completed, signed off  Sherald Barge, RN 08/28/2016, 9:01 AM

## 2016-08-28 NOTE — Discharge Summary (Signed)
[name], is a 83 y.o. female  DOB 1932/05/07  MRN [id number].  Admission date:  08/25/2016  Admitting Physician  Albertine Patricia, MD  Discharge Date:  08/28/2016   Primary MD  [name], MD  Recommendations for primary care physician for things to follow:  - Please check CBC, BMP during next visit to ensure stable renal function and potassium level.   Admission Diagnosis  SOB (shortness of breath) [R06.02]   Discharge Diagnosis  SOB (shortness of breath) [R06.02]    Active Problems:   Chronic atrial fibrillation (HCC)   Chronic anticoagulation   Hypertension   Hyperlipidemia   Acute renal failure superimposed on stage 3 chronic kidney disease (HCC)   CHF exacerbation (HCC)      Past Medical History:  Diagnosis Date  . Carotid artery disease ([place])    RIght CEA 05/2004  . Chronic anticoagulation   . Chronic atrial fibrillation ([place])   . Chronic blood loss anemia   . Chronic diastolic CHF (congestive heart failure) ([place])   . Coronary atherosclerosis of native coronary artery    a. RCA stent 2003 with 60% LCx, 60% OM1, 80% OM2 disease at that time.  . Essential hypertension, benign   . GI bleeding    Recurrent GIB (prior gastric ulcer/diverticulosis/AVM in 2017, gastric AVM s/p endoscopic therapy in 05/2016, recurrent GIB 06/2016 with AVM s/p therapy on endoscopy.  . Hyperlipidemia   . Lymphedema    a. in general, her LE edema appears to be a poor indicator of her total fluid status - chronic edema.  . Obesity   . Renal artery stenosis (HCC)    Bilateral renal artery stents 2004  . Tobacco abuse, in remission    40 pack year stopped in 1989    Past Surgical History:  Procedure Laterality Date  . APPENDECTOMY    . CAROTID ENDARTERECTOMY     Right in 2005, left in 2010  . COLONOSCOPY  Never   Declines  . COLONOSCOPY N/A 08/19/2015   Procedure: COLONOSCOPY;  Surgeon:  Rogene Houston, MD;  [facility];  Service: Endoscopy;  Laterality: N/A;  7:30  . CORONARY ANGIOPLASTY    . ESOPHAGOGASTRODUODENOSCOPY N/A 08/19/2015   Procedure: ESOPHAGOGASTRODUODENOSCOPY (EGD);  Surgeon: Rogene Houston, MD;  [facility];  Service: Endoscopy;  Laterality: N/A;  . ESOPHAGOGASTRODUODENOSCOPY N/A 05/24/2016   Procedure: ESOPHAGOGASTRODUODENOSCOPY (EGD);  Surgeon: Rogene Houston, MD;  [facility];  Service: Endoscopy;  Laterality: N/A;  . ESOPHAGOGASTRODUODENOSCOPY N/A 06/19/2016   Procedure: ESOPHAGOGASTRODUODENOSCOPY (EGD);  Surgeon: Rogene Houston, MD;  [facility];  Service: Endoscopy;  Laterality: N/A;  . GIVENS CAPSULE STUDY N/A 06/20/2016   Procedure: GIVENS CAPSULE STUDY;  Surgeon: Rogene Houston, MD;  [facility];  Service: Endoscopy;  Laterality: N/A;       History of present illness and  Hospital Course:     Kindly see H&P for history of present illness and admission details, please review complete  Labs, Consult reports and Test reports for all details in brief  HPI  from the history and physical done on the day of admission 08/25/2016   [name]  is a 83 y.o. female, with history of chronic atrial fib on Coumadin, CAD (RCA stent 2003 with 60% LCx, 60% OM1, 80% OM2 disease at that time), h/o recurrent GIB (prior gastric ulcer/diverticulosis/AVM in 2017, gastric AVM s/p endoscopic therapy in 05/2016, recent recurrent GIB 06/2016), lymphedema, anemia, bilateral renal artery stenosis with bilateral stenting in 2004, carotid artery disease s/p R CEA, HTN, HLD, obesity, prior tobacco abuse, venous insufficiency, chronic diastolic CHF who presents to ED with complaints of palpitation, lightheadedness, patient reports she has been feeling weak over the last couple days, she has dyspnea at baseline, reports a slightly worsened than usual, but she denies orthopnea, report has some palpitation, noticed her heart rate  105-110 on the pulse ox, which prompted her to come to the ED, she is known with baseline lymphedema, but she presents with impressive edema, all the way to mid abdomen, had elevated d-dimer is, CTA chest was obtained by ED physician which was negative for PE, she was started on IV Lasix, creatinine 1.47, glycohemoglobin 8.4 at baseline, she denies any chest pain, fever, chills, cough, hemoptysis, abdominal pain, nausea, vomiting or diarrhea.  Hospital Course   83 y.o.female,with history of chronic atrial fib on Coumadin, CAD (RCA stent 2003 with 60% LCx, 60% OM1, 80% OM2 disease at that time), h/o recurrent GIB (prior gastric ulcer/diverticulosis/AVM in 2017, gastric AVM s/p endoscopic therapy in 05/2016, recent recurrent GIB 06/2016), lymphedema, anemia, bilateral renal artery stenosis with bilateral stenting in 2004, carotid artery disease s/p R CEA, HTN, HLD, obesity, prior tobacco abuse, venous insufficiency, chronic diastolic CHFwho presents to ED with complaints of palpitation, lightheadedness, Presents with generalized weakness, she was admitted for significant volume overload, acute on chronic CHF.   Acute on chronic CHF - Presents with worsening edema up to mid abdomen , JVD distention, worsening dyspnea,CT chest with cardiomegaly with distended IVC and small bilateral pleural effusion. - She was treated with IV Lasix 80 mg IV every 8 hours, and required metolazone almost on a daily basis, appropriately responded to diuresis, -6.2 L since admission, weight 180 on admission, 169 on discharge, she will be discharged on home dose torsemide 80 mg twice a day, and to start metolazone 2.5 mg every Monday/Wednesday/Friday, Sunday instructed to wait patient every day, and to give extra dose of metolazone for fluid weight gain of 3 pounds or more . - 2-D echo with preserved EF 60%, given  A. fib unable to evaluate for diastolic dysfunction. - She is on beta blockers, could not tolerate losartan in the  past secondary to hypotension, while she needed titration of her heartrate controlling medications.  AKI on CK D stage III - Creatinine 1.47 on admission, remained stable on diuresis, creatinine of 1.39 on discharge .  Hypokalemia - Secondary to diuresis,  potassium is 3 at time of discharge, she received oral supplements before discharge, as well as instructed to increase her potassium supplement on discharge for next 5 days.  Chronic atrial fibrillation - CHADSVASC is 6, he is on warfarin with therapeutic INR, continue with warfarin, pharmacy to dose, continue with bisoprolol, heart rate acceptable.  Chronic blood loss anemia - Continue the iron supplement, she is on IV Feraheme as well on weekly basis as an outpatient, hemoglobin is stable at 7.8 today. No evidence of GI bleed, normal bowel movement  yesterday with no melena or blood in the stool, given her history of chronic intermittent GI blood loss, she was transfused 1 unit PRBC before discharge. And was instructed to keep her appointment for IV iron transfusion this coming Thursday.  CAD - Denies any chest pain, no recent angina, she is not on aspirin due to risk of bleeding with concomitant warfarin  Hypertension - Blood pressure acceptable, continue with bisoprolol     Discharge Condition:  Stable, discussed with son   Follow UP  Follow-up Information    [name], MD Follow up in 1 week(s).   Specialty:  Family Medicine Contact information: [address] [place] [address] [phone]             Discharge Instructions  and  Discharge Medications    Discharge Instructions    Discharge instructions    Complete by:  As directed    Follow with Primary MD [name], MD in 7 days   Get CBC, CMP,  checked  by Primary MD next visit.    Activity: As tolerated with Full fall precautions use walker/cane & assistance as needed   Disposition Home    Diet: Heart Healthy ,low salt  with fluid restriction 1.5 L/day., with feeding assistance and aspiration precautions.  For Heart failure patients - Check your Weight same time everyday, if you gain over 2 pounds, or you develop in leg swelling, experience more shortness of breath or chest pain, call your Primary MD immediately. Follow Cardiac Low Salt Diet and 1.5 lit/day fluid restriction.   On your next visit with your primary care physician please Get Medicines reviewed and adjusted.   Please request your Prim.MD to go over all Hospital Tests and Procedure/Radiological results at the follow up, please get all Hospital records sent to your Prim MD by signing hospital release before you go home.   If you experience worsening of your admission symptoms, develop shortness of breath, life threatening emergency, suicidal or homicidal thoughts you must seek medical attention immediately by calling 911 or calling your MD immediately  if symptoms less severe.  You Must read complete instructions/literature along with all the possible adverse reactions/side effects for all the Medicines you take and that have been prescribed to you. Take any new Medicines after you have completely understood and accpet all the possible adverse reactions/side effects.    Do not drive when taking Pain medications.    Do not take more than prescribed Pain, Sleep and Anxiety Medications  Special Instructions: If you have smoked or chewed Tobacco  in the last 2 yrs please stop smoking, stop any regular Alcohol  and or any Recreational drug use.  Wear Seat belts while driving.   Please note  You were cared for by a hospitalist during your hospital stay. If you have any questions about your discharge medications or the care you received while you were in the hospital after you are discharged, you can call the unit and asked to speak with the hospitalist on call if the hospitalist that took care of you is not available. Once you are discharged, your  primary care physician will handle any further medical issues. Please note that NO REFILLS for any discharge medications will be authorized once you are discharged, as it is imperative that you return to your primary care physician (or establish a relationship with a primary care physician if you do not have one) for your aftercare needs so that they can reassess your need for medications  and monitor your lab values.   Increase activity slowly    Complete by:  As directed      Allergies as of 08/28/2016      Reactions   Diltiazem Other (See Comments)   Edema   Verapamil Other (See Comments)   Diarrhea      Medication List    TAKE these medications   atorvastatin 80 MG tablet Commonly known as:  LIPITOR TAKE 1 TABLET EVERY NIGHT AT BEDTIME   bisoprolol 10 MG tablet Commonly known as:  ZEBETA take 1 tablet by mouth twice a day   FLINTSTONES PLUS IRON PO Take by mouth 2 (two) times daily.   metolazone 2.5 MG tablet Commonly known as:  ZAROXOLYN Take 1 tablet (2.5 mg total) by mouth every Monday, Wednesday, and Friday. Start taking on:  08/29/2016   pantoprazole 40 MG tablet Commonly known as:  PROTONIX Take 1 tablet (40 mg total) by mouth 2 (two) times daily before a meal.   potassium chloride SA 20 MEQ tablet Commonly known as:  K-DUR,KLOR-CON Please take 40 mg oral twice daily for 3 days, then continue with 20 mg oral twice daily What changed:  how much to take  how to take this  when to take this  additional instructions   terazosin 5 MG capsule Commonly known as:  HYTRIN take 1 capsule by mouth at bedtime   torsemide 20 MG tablet Commonly known as:  DEMADEX Take 4 tablets (80 mg total) by mouth 2 (two) times daily.   warfarin 5 MG tablet Commonly known as:  COUMADIN Take 1 tablet daily except 1/2 tablet on Tuesdays and Saturdays or as directed by Coumadin Clinic         Diet and Activity recommendation: See Discharge Instructions above   Consults  obtained -  None   Major procedures and Radiology Reports - PLEASE review detailed and final reports for all details, in brief -     Dg Chest 2 View  Result Date: 08/25/2016 CLINICAL DATA:  Atrial fibrillation.  Tachycardia. EXAM: CHEST  2 VIEW COMPARISON:  June 17, 2016 FINDINGS: Cardiomegaly persists. The hila and mediastinum are normal. No pneumothorax. No pulmonary nodules, masses, or focal infiltrates. No overt edema is identified. IMPRESSION: No acute abnormalities.  Cardiomegaly. Electronically Signed   By: Dorise Bullion III M.D   On: 08/25/2016 09:58   Ct Angio Chest Pe W Or Wo Contrast  Result Date: 08/25/2016 CLINICAL DATA:  Shortness of breath, lightheadedness, bilateral leg swelling. History of coronary artery disease, CHF, hypertension, lymphedema, renal artery stenosis with bilateral stents. EXAM: CT ANGIOGRAPHY CHEST WITH CONTRAST TECHNIQUE: Multidetector CT imaging of the chest was performed using the standard protocol during bolus administration of intravenous contrast. Multiplanar CT image reconstructions and MIPs were obtained to evaluate the vascular anatomy. CONTRAST:  80 cc Isovue 370 COMPARISON:  Chest CT angiogram dated 09/02/2009. FINDINGS: Cardiovascular: Some of the most peripheral segmental and subsegmental pulmonary artery branches are difficult to definitively characterize due to patient breathing motion artifact, however, there is no pulmonary embolism identified within the main, lobar or central segmental pulmonary arteries bilaterally. No aortic aneurysm or dissection. Prominent atherosclerotic changes along the walls of the thoracic aorta and upper abdominal aorta. Cardiomegaly. No pericardial effusion. Coronary artery calcifications. Mediastinum/Nodes: No mass or enlarged lymph nodes seen within the mediastinum or perihilar regions. Esophagus is unremarkable. Trachea and central bronchi are unremarkable. Lungs/Pleura: Small bilateral pleural effusions. Mild  atelectasis at each lung base. Lungs otherwise clear.  Upper Abdomen: Distention of the IVC suggesting CHF. Small amount of ascites within the upper abdomen. Musculoskeletal: No acute or suspicious osseous finding. Ill-defined edema within the subcutaneous soft tissues of the upper abdomen suggesting anasarca. Review of the MIP images confirms the above findings. IMPRESSION: 1. No pulmonary embolism identified, with mild study limitations detailed above. 2. Cardiomegaly. Diffuse coronary artery calcifications. Distension of the IVC suggesting CHF, but no corresponding pulmonary edema at this point. 3. Small bilateral pleural effusions. 4. Aortic atherosclerosis.  No aortic aneurysm or dissection. 5. Small amount of ascites seen within the upper abdomen. 6. Presumed anasarca within the subcutaneous soft tissues of the upper abdomen, further indication of volume overload. Electronically Signed   By: Franki Cabot M.D.   On: 08/25/2016 12:55    Micro Results   No results found for this or any previous visit (from the past 240 hour(s)).     Today   Subjective:   Cleaster [place] today has no headache,no chest or abdominal pain,no new weakness tingling or numbness, feels much better wants to go home today. Reports bowel movement 3 yesterday, normal in color.  Objective:   Blood pressure (!) 104/43, pulse 68, temperature 97.6 F (36.4 C), temperature source Oral, resp. rate 18, height 5\' 2"  (1.575 m), weight 77 kg (169 lb 12.8 oz), SpO2 91 %.   Intake/Output Summary (Last 24 hours) at 08/28/16 1338 Last data filed at 08/28/16 4097  Gross per 24 hour  Intake              680 ml  Output             3650 ml  Net            -2970 ml    Exam Awake Alert, Oriented x 3, Supple Neck,No JVD,  Symmetrical Chest wall movement, Good air movement bilaterally, CTAB Irregular irregular, rate controlled, no arrhythmias on telemetry, heart rate averaging in the 80s,No Gallops,Rubs or new Murmurs, No  Parasternal Heave +ve B.Sounds, Abd Soft, Non tender,  No rebound -guarding or rigidity. No Cyanosis, Clubbing, edema subsiding, +3 bilaterally on discharge(patient with baseline significant chronic lower extremity edema)  Data Review   CBC w Diff:  Lab Results  Component Value Date   WBC 8.3 08/28/2016   HGB 7.8 (L) 08/28/2016   HCT 27.5 (L) 08/28/2016   PLT 134 (L) 08/28/2016   LYMPHOPCT 6 08/25/2016   MONOPCT 9 08/25/2016   EOSPCT 1 08/25/2016   BASOPCT 1 08/25/2016    CMP:  Lab Results  Component Value Date   NA 137 08/28/2016   K 3.0 (L) 08/28/2016   CL 92 (L) 08/28/2016   CO2 33 (H) 08/28/2016   BUN 19 08/28/2016   CREATININE 1.39 (H) 08/28/2016   CREATININE 0.99 (H) 11/16/2015   PROT 7.0 08/25/2016   ALBUMIN 3.5 08/25/2016   BILITOT 1.6 (H) 08/25/2016   ALKPHOS 97 08/25/2016   AST 45 (H) 08/25/2016   ALT 20 08/25/2016  .   Total Time in preparing paper work, data evaluation and todays exam - 35 minutes  ELGERGAWY, DAWOOD M.D on 08/28/2016 at [facility]   Office  [phone]

## 2016-08-28 NOTE — Progress Notes (Signed)
Discharge instructions given on medications and follow up visits,patient verbalized understanding. Prescriptions sent with patient. IV discontinued catheter intact. Accompanied by staff to an awaiting vehicle.

## 2016-08-28 NOTE — Care Management Important Message (Signed)
Important Message  Patient Details  Name: [name] MRN: [id number] Date of Birth: 1932-10-17   Medicare Important Message Given:  Yes    Sherald Barge, RN 08/28/2016, 9:43 AM

## 2016-08-29 LAB — TYPE AND SCREEN
ABO/RH(D): O POS
Antibody Screen: NEGATIVE
Unit division: 0

## 2016-08-29 LAB — BPAM RBC
Blood Product Expiration Date: 201806072359
ISSUE DATE / TIME: 201805151314
Unit Type and Rh: 5100

## 2016-08-30 ENCOUNTER — Encounter (HOSPITAL_COMMUNITY): Payer: Self-pay

## 2016-08-30 ENCOUNTER — Encounter (HOSPITAL_COMMUNITY)
Admission: RE | Admit: 2016-08-30 | Discharge: 2016-08-30 | Disposition: A | Discharge status: Home / Self Care | Payer: Medicare Other | Source: Ambulatory Visit | Attending: Internal Medicine | Admitting: Internal Medicine

## 2016-08-30 DIAGNOSIS — D509 Iron deficiency anemia, unspecified: Secondary | ICD-10-CM | POA: Diagnosis not present

## 2016-08-30 DIAGNOSIS — D649 Anemia, unspecified: Secondary | ICD-10-CM | POA: Diagnosis not present

## 2016-08-30 MED ORDER — SODIUM CHLORIDE 0.9 % IV SOLN
510.0000 mg | Freq: Once | INTRAVENOUS | Status: AC
  Administered 2016-08-30: 510 mg via INTRAVENOUS
  Filled 2016-08-30: qty 17

## 2016-08-30 MED ORDER — SODIUM CHLORIDE 0.9 % IV SOLN
Freq: Once | INTRAVENOUS | Status: AC
  Administered 2016-08-30: 250 mL via INTRAVENOUS

## 2016-08-30 NOTE — Discharge Instructions (Signed)

## 2016-09-03 ENCOUNTER — Other Ambulatory Visit: Payer: Self-pay

## 2016-09-03 DIAGNOSIS — Z6830 Body mass index (BMI) 30.0-30.9, adult: Secondary | ICD-10-CM | POA: Diagnosis not present

## 2016-09-03 DIAGNOSIS — I2510 Atherosclerotic heart disease of native coronary artery without angina pectoris: Secondary | ICD-10-CM | POA: Diagnosis not present

## 2016-09-03 DIAGNOSIS — I739 Peripheral vascular disease, unspecified: Secondary | ICD-10-CM | POA: Diagnosis not present

## 2016-09-03 DIAGNOSIS — E6609 Other obesity due to excess calories: Secondary | ICD-10-CM | POA: Diagnosis not present

## 2016-09-03 DIAGNOSIS — D649 Anemia, unspecified: Secondary | ICD-10-CM | POA: Diagnosis not present

## 2016-09-03 DIAGNOSIS — E876 Hypokalemia: Secondary | ICD-10-CM | POA: Diagnosis not present

## 2016-09-03 NOTE — Patient Outreach (Signed)
[place] [facility]) Care Management  09/03/2016  [name] 08-28-1932 [id number]   EMMI: Heart failure Referral date: 09/03/16 Referral source: EMMI heart failure red alert Referral reason: New or worsening problems: YES Day # 2  Telephone call to patient regarding EMMI heart failure red alert. HIPAA compliant voice message left with call back phone number.   PLAN: RNCM will attempt 2nd telephone outreach to patient within 3 business days.   Quinn Plowman RN,BSN,CCM [facility] Telephonic  [phone]

## 2016-09-04 ENCOUNTER — Emergency Department (HOSPITAL_COMMUNITY): Payer: Medicare Other

## 2016-09-04 ENCOUNTER — Encounter (HOSPITAL_COMMUNITY): Payer: Self-pay | Admitting: Emergency Medicine

## 2016-09-04 ENCOUNTER — Inpatient Hospital Stay (HOSPITAL_COMMUNITY)
Admission: EM | Admit: 2016-09-04 | Discharge: 2016-09-06 | DRG: 378 | Disposition: A | Discharge status: Home / Self Care | Payer: Medicare Other | Attending: Internal Medicine | Admitting: Internal Medicine

## 2016-09-04 DIAGNOSIS — I5032 Chronic diastolic (congestive) heart failure: Secondary | ICD-10-CM | POA: Diagnosis not present

## 2016-09-04 DIAGNOSIS — I2510 Atherosclerotic heart disease of native coronary artery without angina pectoris: Secondary | ICD-10-CM | POA: Diagnosis not present

## 2016-09-04 DIAGNOSIS — K5521 Angiodysplasia of colon with hemorrhage: Secondary | ICD-10-CM | POA: Diagnosis present

## 2016-09-04 DIAGNOSIS — I517 Cardiomegaly: Secondary | ICD-10-CM | POA: Diagnosis not present

## 2016-09-04 DIAGNOSIS — Z955 Presence of coronary angioplasty implant and graft: Secondary | ICD-10-CM

## 2016-09-04 DIAGNOSIS — K2921 Alcoholic gastritis with bleeding: Secondary | ICD-10-CM

## 2016-09-04 DIAGNOSIS — K922 Gastrointestinal hemorrhage, unspecified: Secondary | ICD-10-CM | POA: Diagnosis not present

## 2016-09-04 DIAGNOSIS — E785 Hyperlipidemia, unspecified: Secondary | ICD-10-CM | POA: Diagnosis present

## 2016-09-04 DIAGNOSIS — I482 Chronic atrial fibrillation, unspecified: Secondary | ICD-10-CM | POA: Diagnosis present

## 2016-09-04 DIAGNOSIS — K31811 Angiodysplasia of stomach and duodenum with bleeding: Principal | ICD-10-CM | POA: Diagnosis present

## 2016-09-04 DIAGNOSIS — D649 Anemia, unspecified: Secondary | ICD-10-CM

## 2016-09-04 DIAGNOSIS — Z8249 Family history of ischemic heart disease and other diseases of the circulatory system: Secondary | ICD-10-CM

## 2016-09-04 DIAGNOSIS — K921 Melena: Secondary | ICD-10-CM

## 2016-09-04 DIAGNOSIS — Z5181 Encounter for therapeutic drug level monitoring: Secondary | ICD-10-CM | POA: Diagnosis not present

## 2016-09-04 DIAGNOSIS — Z66 Do not resuscitate: Secondary | ICD-10-CM | POA: Diagnosis present

## 2016-09-04 DIAGNOSIS — K625 Hemorrhage of anus and rectum: Secondary | ICD-10-CM | POA: Diagnosis not present

## 2016-09-04 DIAGNOSIS — N183 Chronic kidney disease, stage 3 unspecified: Secondary | ICD-10-CM | POA: Diagnosis present

## 2016-09-04 DIAGNOSIS — Z801 Family history of malignant neoplasm of trachea, bronchus and lung: Secondary | ICD-10-CM

## 2016-09-04 DIAGNOSIS — E876 Hypokalemia: Secondary | ICD-10-CM | POA: Diagnosis present

## 2016-09-04 DIAGNOSIS — I959 Hypotension, unspecified: Secondary | ICD-10-CM | POA: Diagnosis present

## 2016-09-04 DIAGNOSIS — N179 Acute kidney failure, unspecified: Secondary | ICD-10-CM | POA: Diagnosis not present

## 2016-09-04 DIAGNOSIS — K3189 Other diseases of stomach and duodenum: Secondary | ICD-10-CM | POA: Diagnosis not present

## 2016-09-04 DIAGNOSIS — K228 Other specified diseases of esophagus: Secondary | ICD-10-CM | POA: Diagnosis not present

## 2016-09-04 DIAGNOSIS — Z87891 Personal history of nicotine dependence: Secondary | ICD-10-CM | POA: Diagnosis not present

## 2016-09-04 DIAGNOSIS — K920 Hematemesis: Secondary | ICD-10-CM

## 2016-09-04 DIAGNOSIS — D62 Acute posthemorrhagic anemia: Secondary | ICD-10-CM | POA: Diagnosis not present

## 2016-09-04 DIAGNOSIS — I130 Hypertensive heart and chronic kidney disease with heart failure and stage 1 through stage 4 chronic kidney disease, or unspecified chronic kidney disease: Secondary | ICD-10-CM | POA: Diagnosis present

## 2016-09-04 DIAGNOSIS — I5033 Acute on chronic diastolic (congestive) heart failure: Secondary | ICD-10-CM | POA: Diagnosis present

## 2016-09-04 DIAGNOSIS — K31819 Angiodysplasia of stomach and duodenum without bleeding: Secondary | ICD-10-CM | POA: Diagnosis not present

## 2016-09-04 DIAGNOSIS — Z7901 Long term (current) use of anticoagulants: Secondary | ICD-10-CM | POA: Diagnosis not present

## 2016-09-04 DIAGNOSIS — I10 Essential (primary) hypertension: Secondary | ICD-10-CM | POA: Diagnosis not present

## 2016-09-04 HISTORY — DX: Gastric ulcer, unspecified as acute or chronic, without hemorrhage or perforation: K25.9

## 2016-09-04 HISTORY — DX: Angiodysplasia of stomach and duodenum without bleeding: K31.819

## 2016-09-04 LAB — COMPREHENSIVE METABOLIC PANEL
ALBUMIN: 2.6 g/dL — AB (ref 3.5–5.0)
ALT: 17 U/L (ref 14–54)
ANION GAP: 9 (ref 5–15)
AST: 33 U/L (ref 15–41)
Alkaline Phosphatase: 64 U/L (ref 38–126)
BUN: 33 mg/dL — AB (ref 6–20)
CALCIUM: 8.3 mg/dL — AB (ref 8.9–10.3)
CO2: 32 mmol/L (ref 22–32)
Chloride: 98 mmol/L — ABNORMAL LOW (ref 101–111)
Creatinine, Ser: 1.11 mg/dL — ABNORMAL HIGH (ref 0.44–1.00)
GFR calc Af Amer: 53 mL/min — ABNORMAL LOW (ref >60)
GFR calc non Af Amer: 45 mL/min — ABNORMAL LOW (ref >60)
GLUCOSE: 127 mg/dL — AB (ref 65–99)
Potassium: 3.2 mmol/L — ABNORMAL LOW (ref 3.5–5.1)
SODIUM: 139 mmol/L (ref 135–145)
Total Bilirubin: 1.5 mg/dL — ABNORMAL HIGH (ref 0.3–1.2)
Total Protein: 5.1 g/dL — ABNORMAL LOW (ref 6.5–8.1)

## 2016-09-04 LAB — CBC WITH DIFFERENTIAL/PLATELET
Basophils Absolute: 0.1 10*3/uL (ref 0.0–0.1)
Basophils Relative: 1 %
EOS ABS: 0.1 10*3/uL (ref 0.0–0.7)
EOS PCT: 1 %
HCT: 23 % — ABNORMAL LOW (ref 36.0–46.0)
HEMOGLOBIN: 6.6 g/dL — AB (ref 12.0–15.0)
LYMPHS ABS: 1 10*3/uL (ref 0.7–4.0)
LYMPHS PCT: 11 %
MCH: 24.1 pg — AB (ref 26.0–34.0)
MCHC: 28.7 g/dL — AB (ref 30.0–36.0)
MCV: 83.9 fL (ref 78.0–100.0)
MONOS PCT: 8 %
Monocytes Absolute: 0.7 10*3/uL (ref 0.1–1.0)
Neutro Abs: 6.8 10*3/uL (ref 1.7–7.7)
Neutrophils Relative %: 79 %
PLATELETS: 133 10*3/uL — AB (ref 150–400)
RBC: 2.74 MIL/uL — ABNORMAL LOW (ref 3.87–5.11)
RDW: 24.1 % — ABNORMAL HIGH (ref 11.5–15.5)
WBC: 8.7 10*3/uL (ref 4.0–10.5)

## 2016-09-04 LAB — PROTIME-INR
INR: 3.02
INR: 2.3
Prothrombin Time: 32 seconds — ABNORMAL HIGH (ref 11.4–15.2)
Prothrombin Time: 25.7 seconds — ABNORMAL HIGH (ref 11.4–15.2)

## 2016-09-04 LAB — MRSA PCR SCREENING: MRSA BY PCR: INVALID — AB

## 2016-09-04 LAB — HEMOGLOBIN AND HEMATOCRIT, BLOOD
HCT: 22.9 % — ABNORMAL LOW (ref 36.0–46.0)
HCT: 24 % — ABNORMAL LOW (ref 36.0–46.0)
HCT: 25.4 % — ABNORMAL LOW (ref 36.0–46.0)
HEMOGLOBIN: 6.7 g/dL — AB (ref 12.0–15.0)
HEMOGLOBIN: 7.6 g/dL — AB (ref 12.0–15.0)
Hemoglobin: 7.2 g/dL — ABNORMAL LOW (ref 12.0–15.0)

## 2016-09-04 LAB — POC OCCULT BLOOD, ED: Fecal Occult Bld: POSITIVE — AB

## 2016-09-04 LAB — LIPASE, BLOOD: Lipase: 37 U/L (ref 11–51)

## 2016-09-04 LAB — PREPARE RBC (CROSSMATCH)

## 2016-09-04 LAB — TROPONIN I: Troponin I: <0.03 ng/mL (ref <0.03)

## 2016-09-04 MED ORDER — ACETAMINOPHEN 325 MG PO TABS
650.0000 mg | ORAL_TABLET | Freq: Once | ORAL | Status: AC
  Administered 2016-09-04: 650 mg via ORAL
  Filled 2016-09-04: qty 2

## 2016-09-04 MED ORDER — SODIUM CHLORIDE 0.9 % IV SOLN
INTRAVENOUS | Status: DC
  Administered 2016-09-05: via INTRAVENOUS

## 2016-09-04 MED ORDER — FUROSEMIDE 10 MG/ML IJ SOLN
20.0000 mg | Freq: Once | INTRAMUSCULAR | Status: AC
  Administered 2016-09-04: 20 mg via INTRAVENOUS
  Filled 2016-09-04: qty 2

## 2016-09-04 MED ORDER — PANTOPRAZOLE SODIUM 40 MG IV SOLR: 40.0000 mg | Freq: Two times a day (BID) | INTRAVENOUS | Status: DC

## 2016-09-04 MED ORDER — VITAMIN K1 1 MG/0.5ML IJ SOLN
2.0000 mg | Freq: Once | INTRAMUSCULAR | Status: AC
  Administered 2016-09-04: 2 mg via SUBCUTANEOUS
  Filled 2016-09-04: qty 1

## 2016-09-04 MED ORDER — ACETAMINOPHEN 650 MG RE SUPP
650.0000 mg | Freq: Four times a day (QID) | RECTAL | Status: DC | PRN
  Filled 2016-09-04: qty 1

## 2016-09-04 MED ORDER — SODIUM CHLORIDE 0.9 % IV SOLN
8.0000 mg/h | INTRAVENOUS | Status: DC
  Administered 2016-09-04 – 2016-09-06 (×5): 8 mg/h via INTRAVENOUS
  Filled 2016-09-04 (×6): qty 80

## 2016-09-04 MED ORDER — PANTOPRAZOLE SODIUM 40 MG IV SOLR
80.0000 mg | Freq: Once | INTRAVENOUS | Status: AC
  Administered 2016-09-04: 80 mg via INTRAVENOUS
  Filled 2016-09-04: qty 80

## 2016-09-04 MED ORDER — DIPHENHYDRAMINE HCL 25 MG PO CAPS
25.0000 mg | ORAL_CAPSULE | Freq: Once | ORAL | Status: AC
  Administered 2016-09-04: 25 mg via ORAL
  Filled 2016-09-04: qty 1

## 2016-09-04 MED ORDER — SODIUM CHLORIDE 0.9 % IV SOLN
10.0000 mL/h | Freq: Once | INTRAVENOUS | Status: AC
  Administered 2016-09-04: 10 mL/h via INTRAVENOUS

## 2016-09-04 MED ORDER — SODIUM CHLORIDE 0.9 % IV SOLN
Freq: Once | INTRAVENOUS | Status: AC
  Administered 2016-09-05: 08:00:00 via INTRAVENOUS

## 2016-09-04 MED ORDER — ACETAMINOPHEN 325 MG PO TABS
650.0000 mg | ORAL_TABLET | Freq: Four times a day (QID) | ORAL | Status: DC | PRN
  Filled 2016-09-04: qty 2

## 2016-09-04 NOTE — ED Notes (Addendum)
Pt taken to xray.awaiting protonix from pharmacy

## 2016-09-04 NOTE — ED Provider Notes (Signed)
[facility] DEPT Provider Note   CSN: [id number] Arrival date & time: 09/04/16  0710     History   Chief Complaint Chief Complaint  Patient presents with  . GI Bleeding    HPI [name] is a 83 y.o. female.  HPI  Pt was seen at 76. Per pt report, c/o gradual onset and persistence of several episodes of "rectal bleeding" that began this morning. Pt states she got up to have a BM approximately 0300, but did not look in the toilet afterwards. Pt states a short time after that, she felt she needed to have another BM, and then noticed blood in the commode. States her stools have been "brown." Has been associated with LUQ pain and nausea. Denies CP/SOB, no back pain, no fevers.   Past Medical History:  Diagnosis Date  . Angiodysplasia of stomach and duodenum   . AV malformation of gastrointestinal tract   . Carotid artery disease ([place])    RIght CEA 05/2004  . Chronic anticoagulation   . Chronic atrial fibrillation ([place])   . Chronic blood loss anemia   . Chronic diastolic CHF (congestive heart failure) ([place])   . Coronary atherosclerosis of native coronary artery    a. RCA stent 2003 with 60% LCx, 60% OM1, 80% OM2 disease at that time.  . Essential hypertension, benign   . Gastric ulcer   . GI bleeding    Recurrent GIB (prior gastric ulcer/diverticulosis/AVM in 2017, gastric AVM s/p endoscopic therapy in 05/2016, recurrent GIB 06/2016 with AVM s/p therapy on endoscopy.  . Hyperlipidemia   . Lymphedema    a. in general, her LE edema appears to be a poor indicator of her total fluid status - chronic edema.  . Obesity   . Renal artery stenosis ([facility])    Bilateral renal artery stents 2004  . Tobacco abuse, in remission    40 pack year stopped in 1989    Patient Active Problem List   Diagnosis Date Noted  . CHF exacerbation ([place]) 08/25/2016  . GI bleed 06/17/2016  . Difficulty in walking, not elsewhere classified   . Dizziness and giddiness   . Gastrointestinal  hemorrhage associated with peptic ulcer   . Renal impairment   . Acute blood loss anemia 05/22/2016  . Chronic blood loss anemia 05/22/2016  . Chronic diastolic CHF (congestive heart failure) ([place]) 05/22/2016  . Acute renal failure superimposed on stage 3 chronic kidney disease ([place]) 05/22/2016  . Anticoagulated on Coumadin   . Symptomatic anemia 07/11/2015  . Microcytic anemia 07/11/2015  . Fatigue 07/11/2015  . Occlusion and stenosis of carotid artery without mention of cerebral infarction-Bilateral 11/11/2013  . Aftercare following surgery of the circulatory system, [place] 11/11/2013  . Varicose veins of lower extremities with other complications 66/09/3014  . Encounter for therapeutic drug monitoring 05/18/2013  . Fasting hyperglycemia 07/03/2012  . Chronic anticoagulation   . Tobacco abuse, in remission   . Carotid artery occlusion   . Hypertension   . Obesity   . Hyperlipidemia   . Chronic venous insufficiency 07/26/2010  . Chronic atrial fibrillation ([place]) 04/05/2010  . Coronary atherosclerosis of native coronary artery 04/05/2010  . PERIPHERAL VASCULAR DISEASE 04/05/2010    Past Surgical History:  Procedure Laterality Date  . APPENDECTOMY    . CAROTID ENDARTERECTOMY     Right in 2005, left in 2010  . COLONOSCOPY  Never   Declines  . COLONOSCOPY N/A 08/19/2015   Procedure: COLONOSCOPY;  Surgeon: Rogene Houston, MD;  Location[facility];  Service: Endoscopy;  Laterality: N/A;  7:30  . CORONARY ANGIOPLASTY    . ESOPHAGOGASTRODUODENOSCOPY N/A 08/19/2015   Procedure: ESOPHAGOGASTRODUODENOSCOPY (EGD);  Surgeon: Rogene Houston, MD;  Location: [facility];  Service: Endoscopy;  Laterality: N/A;  . ESOPHAGOGASTRODUODENOSCOPY N/A 05/24/2016   Procedure: ESOPHAGOGASTRODUODENOSCOPY (EGD);  Surgeon: Rogene Houston, MD;  Location: [facility];  Service: Endoscopy;  Laterality: N/A;  . ESOPHAGOGASTRODUODENOSCOPY N/A 06/19/2016   Procedure: ESOPHAGOGASTRODUODENOSCOPY (EGD);   Surgeon: Rogene Houston, MD;  Location[facility];  Service: Endoscopy;  Laterality: N/A;  . GIVENS CAPSULE STUDY N/A 06/20/2016   Procedure: GIVENS CAPSULE STUDY;  Surgeon: Rogene Houston, MD;  Location: [facility];  Service: Endoscopy;  Laterality: N/A;    OB History    Gravida Para Term Preterm AB Living             4   SAB TAB Ectopic Multiple Live Births                   Home Medications    Prior to Admission medications   Medication Sig Start Date End Date Taking? Authorizing Provider  atorvastatin (LIPITOR) 80 MG tablet TAKE 1 TABLET EVERY NIGHT AT BEDTIME 05/07/16   Satira Sark, MD  bisoprolol (ZEBETA) 10 MG tablet take 1 tablet by mouth twice a day 02/09/16   Satira Sark, MD  metolazone (ZAROXOLYN) 2.5 MG tablet Take 1 tablet (2.5 mg total) by mouth every Monday, Wednesday, and Friday. 08/29/16 09/28/16  Elgergawy, Silver Huguenin, MD  pantoprazole (PROTONIX) 40 MG tablet Take 1 tablet (40 mg total) by mouth 2 (two) times daily before a meal. 05/25/16   Eber Jones, MD  Pediatric Multivitamins-Iron (FLINTSTONES PLUS IRON PO) Take by mouth 2 (two) times daily.    [provider]  potassium chloride SA (K-DUR,KLOR-CON) 20 MEQ tablet Please take 40 mg oral twice daily for 3 days, then continue with 20 mg oral twice daily 08/28/16   Elgergawy, Silver Huguenin, MD  terazosin (HYTRIN) 5 MG capsule take 1 capsule by mouth at bedtime 07/04/16   Satira Sark, MD  torsemide (DEMADEX) 20 MG tablet Take 4 tablets (80 mg total) by mouth 2 (two) times daily. 06/22/16 08/25/16  Charlie Pitter, PA-C  warfarin (COUMADIN) 5 MG tablet Take 1 tablet daily except 1/2 tablet on Tuesdays and Saturdays or as directed by [facility] 07/19/16   Satira Sark, MD    Family History Family History  Problem Relation Age of Onset  . Lung cancer Mother   . Cancer Mother   . Hypertension Daughter   . Hyperlipidemia Daughter   . Hypertension Son   . Hypertension Daughter   .  Heart disease Maternal Grandmother     Social History Social History  Substance Use Topics  . Smoking status: Former Smoker    Types: Cigarettes    Quit date: 04/30/1987  . Smokeless tobacco: Never Used  . Alcohol use No     Allergies   Diltiazem and Verapamil   Review of Systems Review of Systems ROS: Statement: All systems negative except as marked or noted in the HPI; Constitutional: Negative for fever and chills. ; ; Eyes: Negative for eye pain, redness and discharge. ; ; ENMT: Negative for ear pain, hoarseness, nasal congestion, sinus pressure and sore throat. ; ; Cardiovascular: Negative for chest pain, palpitations, diaphoresis, dyspnea and peripheral edema. ; ; Respiratory: Negative for cough, wheezing and  stridor. ; ; Gastrointestinal: Negative for vomiting, diarrhea, hematemesis, jaundice and +nausea, abd pain, rectal bleeding. . ; ; Genitourinary: Negative for dysuria, flank pain and hematuria. ; ; Musculoskeletal: Negative for back pain and neck pain. Negative for swelling and trauma.; ; Skin: Negative for pruritus, rash, abrasions, blisters, bruising and skin lesion.; ; Neuro: Negative for headache, lightheadedness and neck stiffness. Negative for weakness, altered level of consciousness, altered mental status, extremity weakness, paresthesias, involuntary movement, seizure and syncope.       Physical Exam Updated Vital Signs BP (!) 118/42 (BP Location: Right Arm)   Pulse 76   Temp 97.6 F (36.4 C) (Oral)   Resp 18   Ht 5\' 2"  (1.575 m)   Wt 73.9 kg (163 lb)   SpO2 98%   BMI 29.81 kg/m   Patient Vitals for the past 24 hrs:  BP Temp Temp src Pulse Resp SpO2 Height Weight  09/04/16 0831 (!) 109/58 - - - 14 - - -  09/04/16 0716 (!) 118/42 97.6 F (36.4 C) Oral 76 18 98 % - -  09/04/16 3295 - - - - - - 5\' 2"  (1.575 m) 73.9 kg (163 lb)      07:54:53 Orthostatic Vital Signs CE  Orthostatic Lying   BP- Lying:  99/37  Pulse- Lying: 81      Orthostatic Sitting    BP- Sitting:  87/42  Pulse- Sitting: 97      Orthostatic Standing at 0 minutes  BP- Standing at 0 minutes:  (unable. pt started dry heaving. edp aware. )      Physical Exam 0725: Physical examination:  Nursing notes reviewed; Vital signs and O2 SAT reviewed;  Constitutional: Well developed, Well nourished, Well hydrated, In no acute distress; Head:  Normocephalic, atraumatic; Eyes: EOMI, PERRL, No scleral icterus. Conjunctiva pale.; ENMT: Mouth and pharynx normal, Mucous membranes moist; Neck: Supple, Full range of motion, No lymphadenopathy; Cardiovascular: Irregular rate and rhythm, No gallop; Respiratory: Breath sounds clear & equal bilaterally, No wheezes.  Speaking full sentences with ease, Normal respiratory effort/excursion; Chest: Nontender, Movement normal; Abdomen: Soft, +LUQ tenderness to palp. No rebound or guarding. Nondistended, Normal bowel sounds. Rectal exam performed w/permission of pt and ED RN chaperone present.  Anal tone normal.  Non-tender, maroon stool in rectal vault, heme positive.  No fissures, +external hemorrhoid without thrombosis or active bleeding, no palp masses.\;;; Genitourinary: No CVA tenderness; Extremities: Pulses normal, No tenderness, +2 pedal edema bilat. No calf asymmetry.; Neuro: AA&Ox3, Major CN grossly intact.  Speech clear. No gross focal motor or sensory deficits in extremities.; Skin: Color pale, Warm, Dry.   ED Treatments / Results  Labs (all labs ordered are listed, but only abnormal results are displayed)   EKG  EKG Interpretation  Date/Time:  Tuesday Sep 04 2016 07:21:27 EDT Ventricular Rate:  87 PR Interval:    QRS Duration: 101 QT Interval:  338 QTC Calculation: 407 R Axis:   73 Text Interpretation:  Atrial fibrillation Ventricular premature complex Probable lateral infarct, age indeterminate When compared with ECG of 08/25/2016 No significant change was found Confirmed by [facility]  MD, Nunzio Cory [phone]) on 09/04/2016 7:33:21 AM        Radiology   Procedures Procedures (including critical care time)  Medications Ordered in ED Medications  pantoprazole (PROTONIX) 80 mg in sodium chloride 0.9 % 100 mL IVPB (not administered)  pantoprazole (PROTONIX) 80 mg in sodium chloride 0.9 % 250 mL (0.32 mg/mL) infusion (not administered)  pantoprazole (PROTONIX) injection 40 mg (  not administered)  0.9 %  sodium chloride infusion (not administered)     Initial Impression / Assessment and Plan / ED Course  I have reviewed the triage vital signs and the nursing notes.  Pertinent labs & imaging results that were available during my care of the patient were reviewed by me and considered in my medical decision making (see chart for details).  MDM Reviewed: previous chart, nursing note and vitals Reviewed previous: labs and ECG Interpretation: labs, ECG and x-ray Total time providing critical care: 30-74 minutes. This excludes time spent performing separately reportable procedures and services. Consults: admitting MD   CRITICAL CARE Performed by: Alfonzo Feller Total critical care time: 35 minutes Critical care time was exclusive of separately billable procedures and treating other patients. Critical care was necessary to treat or prevent imminent or life-threatening deterioration. Critical care was time spent personally by me on the following activities: development of treatment plan with patient and/or surrogate as well as nursing, discussions with consultants, evaluation of patient's response to treatment, examination of patient, obtaining history from patient or surrogate, ordering and performing treatments and interventions, ordering and review of laboratory studies, ordering and review of radiographic studies, pulse oximetry and re-evaluation of patient's condition.   Results for orders placed or performed during the hospital encounter of 09/04/16  CBC with Differential  Result Value Ref Range   WBC 8.7 4.0 -  10.5 K/uL   RBC 2.74 (L) 3.87 - 5.11 MIL/uL   Hemoglobin 6.6 (LL) 12.0 - 15.0 g/dL   HCT 23.0 (L) 36.0 - 46.0 %   MCV 83.9 78.0 - 100.0 fL   MCH 24.1 (L) 26.0 - 34.0 pg   MCHC 28.7 (L) 30.0 - 36.0 g/dL   RDW 24.1 (H) 11.5 - 15.5 %   Platelets 133 (L) 150 - 400 K/uL   Neutrophils Relative % 79 %   Lymphocytes Relative 11 %   Monocytes Relative 8 %   Eosinophils Relative 1 %   Basophils Relative 1 %   Neutro Abs 6.8 1.7 - 7.7 K/uL   Lymphs Abs 1.0 0.7 - 4.0 K/uL   Monocytes Absolute 0.7 0.1 - 1.0 K/uL   Eosinophils Absolute 0.1 0.0 - 0.7 K/uL   Basophils Absolute 0.1 0.0 - 0.1 K/uL   RBC Morphology ANISOCYTES   Comprehensive metabolic panel  Result Value Ref Range   Sodium 139 135 - 145 mmol/L   Potassium 3.2 (L) 3.5 - 5.1 mmol/L   Chloride 98 (L) 101 - 111 mmol/L   CO2 32 22 - 32 mmol/L   Glucose, Bld 127 (H) 65 - 99 mg/dL   BUN 33 (H) 6 - 20 mg/dL   Creatinine, Ser 1.11 (H) 0.44 - 1.00 mg/dL   Calcium 8.3 (L) 8.9 - 10.3 mg/dL   Total Protein 5.1 (L) 6.5 - 8.1 g/dL   Albumin 2.6 (L) 3.5 - 5.0 g/dL   AST 33 15 - 41 U/L   ALT 17 14 - 54 U/L   Alkaline Phosphatase 64 38 - 126 U/L   Total Bilirubin 1.5 (H) 0.3 - 1.2 mg/dL   GFR calc non Af Amer 45 (L) >60 mL/min   GFR calc Af Amer 53 (L) >60 mL/min   Anion gap 9 5 - 15  Lipase, blood  Result Value Ref Range   Lipase 37 11 - 51 U/L  Protime-INR  Result Value Ref Range   Prothrombin Time 32.0 (H) 11.4 - 15.2 seconds   INR 3.02   Troponin  I  Result Value Ref Range   Troponin I <0.03 <0.03 ng/mL  POC occult blood, ED  Result Value Ref Range   Fecal Occult Bld POSITIVE (A) NEGATIVE  Type and screen [facility]  Result Value Ref Range   ABO/RH(D) PENDING    Antibody Screen PENDING    Sample Expiration 09/07/2016    Dg Abd Acute W/chest Result Date: 09/04/2016 CLINICAL DATA:  Abdominal pain and gastrointestinal bleeding beginning this morning. EXAM: DG ABDOMEN ACUTE W/ 1V CHEST COMPARISON:  CT chest and PA and  lateral chest 08/25/2016. FINDINGS: Single-view of the chest demonstrates cardiomegaly without edema. Aortic atherosclerosis is noted. No consolidative process, pneumothorax or effusion. Two views of the abdomen show no free intraperitoneal air. The bowel gas pattern is nonobstructive. Left renal artery stent is noted. No acute bony abnormality. IMPRESSION: No acute finding chest or abdomen. Cardiomegaly. Atherosclerosis. Electronically Signed   By: Inge Rise M.D.   On: 09/04/2016 08:39    Results for SHEELA, MCCULLEY (MRN [id number]) as of 09/04/2016 08:17  Ref. Range 08/25/2016 09:22 08/26/2016 05:57 08/27/2016 06:32 08/28/2016 04:14 09/04/2016 07:30  Hemoglobin Latest Ref Range: 12.0 - 15.0 g/dL 8.9 (L) 7.8 (L) 8.2 (L) 7.8 (L) 6.6 (LL)  HCT Latest Ref Range: 36.0 - 46.0 % 30.0 (L) 26.7 (L) 28.5 (L) 27.5 (L) 23.0 (L)  Platelets Latest Ref Range: 150 - 400 K/uL 188 161 145 (L) 134 (L) 133 (L)    0835:  Pt orthostatic. H/H lower than previous; PRBC's transfusion started. IV protonix bolus and gtt started. Dx and testing d/w pt and family.  Questions answered.  Verb understanding, agreeable to admit. T/C to GI Dr. Laural Golden, case discussed, including:  HPI, pertinent PM/SHx, VS/PE, dx testing, ED course and treatment:  Agreeable to consult. T/C to [facility] Dr. Adair Patter, case discussed, including:  HPI, pertinent PM/SHx, VS/PE, dx testing, ED course and treatment:  Agreeable to admit.     Final Clinical Impressions(s) / ED Diagnoses   Final diagnoses:  None    New Prescriptions New Prescriptions   No medications on file     Francine Graven, DO 09/05/16 1103

## 2016-09-04 NOTE — Consult Note (Signed)
Reason for Consult:gi bleed  Referring Physician: Hospitalist  [name] is an 83 y.o. female.  HPI: Admitted thru the ED with rectal bleeding. [name] went to [facility] this am and noticed a significant amt of blood in the commode during a BM. EMS called.  Fecal occult blood card positive in the ED and was marooned colored. [name] also vomited blood x 1 and vomited x 1 in the ED. Daughter states it was bright red.  [name] did not take her coumadin today. Admission hemoglobin 6.7.  Has received 1 units of PRBCs. Post H and H has not been drawn.   Hx of CHF, CAD with stent, atrial fib and maintained on coumadin.     CBC    Component Value Date/Time   WBC 8.7 09/04/2016 0730   RBC 2.74 (L) 09/04/2016 0730   HGB 6.7 (LL) 09/04/2016 1035   HCT 22.9 (L) 09/04/2016 1035   PLT 133 (L) 09/04/2016 0730   MCV 83.9 09/04/2016 0730   MCH 24.1 (L) 09/04/2016 0730   MCHC 28.7 (L) 09/04/2016 0730   RDW 24.1 (H) 09/04/2016 0730   LYMPHSABS 1.0 09/04/2016 0730   MONOABS 0.7 09/04/2016 0730   EOSABS 0.1 09/04/2016 0730   BASOSABS 0.1 09/04/2016 0730     06/19/2016 EGD; IDA, chronic blood loss, melena . Impression:               - Normal esophagus.                           - 2 cm hiatal hernia.                           - Five non-bleeding angiodysplastic lesions in the                            stomach. Treated with argon beam coagulation.                           - Normal duodenal bulb and second portion of the                            duodenum.                           - No specimens collected.  06/20/2016 Given's capsule: Studies complete as Given capsule reached cecum. No noted in stomach small intestine or proximal colon. Three punctate AV malformations involving small bowel without stigmata of bleed      May of 2017 . EGD revealed small antral/gastric ulcer.  H. Pylori was positive.  [name] was treated with Pylera.     08/21/2015 Colonoscopy IDA. Three small angioectasias without  bleeding were found in ascending colon. A few medicum mouthed diverticula were found in sigmoid colon.    Past Medical History:  Diagnosis Date  . Angiodysplasia of stomach and duodenum   . AV malformation of gastrointestinal tract   . Carotid artery disease ([place])    RIght CEA 05/2004  . Chronic anticoagulation   . Chronic atrial fibrillation ([place])   . Chronic blood loss anemia   . Chronic diastolic CHF (congestive heart failure) ([place])   . Coronary atherosclerosis of native coronary artery    a. RCA stent 2003  with 60% LCx, 60% OM1, 80% OM2 disease at that time.  . Essential hypertension, benign   . Gastric ulcer   . GI bleeding    Recurrent GIB (prior gastric ulcer/diverticulosis/AVM in 2017, gastric AVM s/p endoscopic therapy in 05/2016, recurrent GIB 06/2016 with AVM s/p therapy on endoscopy.  . Hyperlipidemia   . Lymphedema    a. in general, her LE edema appears to be a poor indicator of her total fluid status - chronic edema.  . Obesity   . Renal artery stenosis (HCC)    Bilateral renal artery stents 2004  . Tobacco abuse, in remission    40 pack year stopped in 1989    Past Surgical History:  Procedure Laterality Date  . APPENDECTOMY    . CAROTID ENDARTERECTOMY     Right in 2005, left in 2010  . COLONOSCOPY  Never   Declines  . COLONOSCOPY N/A 08/19/2015   Procedure: COLONOSCOPY;  Surgeon: Rogene Houston, MD;  [facility];  Service: Endoscopy;  Laterality: N/A;  7:30  . CORONARY ANGIOPLASTY    . ESOPHAGOGASTRODUODENOSCOPY N/A 08/19/2015   Procedure: ESOPHAGOGASTRODUODENOSCOPY (EGD);  Surgeon: Rogene Houston, MD;  [facility];  Service: Endoscopy;  Laterality: N/A;  . ESOPHAGOGASTRODUODENOSCOPY N/A 05/24/2016   Procedure: ESOPHAGOGASTRODUODENOSCOPY (EGD);  Surgeon: Rogene Houston, MD;  [facility];  Service: Endoscopy;  Laterality: N/A;  . ESOPHAGOGASTRODUODENOSCOPY N/A 06/19/2016   Procedure: ESOPHAGOGASTRODUODENOSCOPY (EGD);  Surgeon:  Rogene Houston, MD;  [facility];  Service: Endoscopy;  Laterality: N/A;  . GIVENS CAPSULE STUDY N/A 06/20/2016   Procedure: GIVENS CAPSULE STUDY;  Surgeon: Rogene Houston, MD;  [facility];  Service: Endoscopy;  Laterality: N/A;    Family History  Problem Relation Age of Onset  . Lung cancer Mother   . Cancer Mother   . Hypertension Daughter   . Hyperlipidemia Daughter   . Hypertension Son   . Hypertension Daughter   . Heart disease Maternal Grandmother     Social History:  reports that [name] quit smoking about 29 years ago. Her smoking use included Cigarettes. [name] has never used smokeless tobacco. [name] reports that [name] does not drink alcohol or use drugs.  Allergies:  Allergies  Allergen Reactions  . Diltiazem Other (See Comments)    Edema  . Verapamil Other (See Comments)    Diarrhea    Medications: I have reviewed the patient's current medications.  Results for orders placed or performed during the hospital encounter of 09/04/16 (from the past 48 hour(s))  POC occult blood, ED     Status: Abnormal   Collection Time: 09/04/16  7:29 AM  Result Value Ref Range   Fecal Occult Bld POSITIVE (A) NEGATIVE  CBC with Differential     Status: Abnormal   Collection Time: 09/04/16  7:30 AM  Result Value Ref Range   WBC 8.7 4.0 - 10.5 K/uL   RBC 2.74 (L) 3.87 - 5.11 MIL/uL   Hemoglobin 6.6 (LL) 12.0 - 15.0 g/dL    Comment: REPEATED TO VERIFY CRITICAL RESULT CALLED TO, READ BACK BY AND VERIFIED WITH: EDWARDS C. AT 2353I ON [id number] BY THOMPSON S.    HCT 23.0 (L) 36.0 - 46.0 %   MCV 83.9 78.0 - 100.0 fL   MCH 24.1 (L) 26.0 - 34.0 pg   MCHC 28.7 (L) 30.0 - 36.0 g/dL   RDW 24.1 (H) 11.5 - 15.5 %   Platelets 133 (L) 150 - 400  K/uL   Neutrophils Relative % 79 %   Lymphocytes Relative 11 %   Monocytes Relative 8 %   Eosinophils Relative 1 %   Basophils Relative 1 %   Neutro Abs 6.8 1.7 - 7.7 K/uL   Lymphs Abs 1.0 0.7 - 4.0 K/uL   Monocytes Absolute 0.7 0.1 -  1.0 K/uL   Eosinophils Absolute 0.1 0.0 - 0.7 K/uL   Basophils Absolute 0.1 0.0 - 0.1 K/uL   RBC Morphology ANISOCYTES     Comment: SCHISTOCYTES NOTED ON SMEAR  Type and screen [facility]     Status: None (Preliminary result)   Collection Time: 09/04/16  7:34 AM  Result Value Ref Range   ABO/RH(D) O POS    Antibody Screen NEG    Sample Expiration 09/07/2016    Unit Number [id number]    Blood Component Type RED CELLS,LR    Unit division 00    Status of Unit ALLOCATED    Transfusion Status OK TO TRANSFUSE    Crossmatch Result Compatible    Unit Number [id number]    Blood Component Type RBC LR PHER2    Unit division 00    Status of Unit ISSUED    Transfusion Status OK TO TRANSFUSE    Crossmatch Result Compatible   Comprehensive metabolic panel     Status: Abnormal   Collection Time: 09/04/16  7:37 AM  Result Value Ref Range   Sodium 139 135 - 145 mmol/L   Potassium 3.2 (L) 3.5 - 5.1 mmol/L   Chloride 98 (L) 101 - 111 mmol/L   CO2 32 22 - 32 mmol/L   Glucose, Bld 127 (H) 65 - 99 mg/dL   BUN 33 (H) 6 - 20 mg/dL   Creatinine, Ser 1.11 (H) 0.44 - 1.00 mg/dL   Calcium 8.3 (L) 8.9 - 10.3 mg/dL   Total Protein 5.1 (L) 6.5 - 8.1 g/dL   Albumin 2.6 (L) 3.5 - 5.0 g/dL   AST 33 15 - 41 U/L   ALT 17 14 - 54 U/L   Alkaline Phosphatase 64 38 - 126 U/L   Total Bilirubin 1.5 (H) 0.3 - 1.2 mg/dL   GFR calc non Af Amer 45 (L) >60 mL/min   GFR calc Af Amer 53 (L) >60 mL/min    Comment: (NOTE) The eGFR has been calculated using the CKD EPI equation. This calculation has not been validated in all clinical situations. eGFR's persistently <60 mL/min signify possible Chronic Kidney Disease.    Anion gap 9 5 - 15  Lipase, blood     Status: None   Collection Time: 09/04/16  7:37 AM  Result Value Ref Range   Lipase 37 11 - 51 U/L  Protime-INR     Status: Abnormal   Collection Time: 09/04/16  7:37 AM  Result Value Ref Range   Prothrombin Time 32.0 (H) 11.4 - 15.2 seconds    INR 3.02   Troponin I     Status: None   Collection Time: 09/04/16  7:37 AM  Result Value Ref Range   Troponin I <0.03 <0.03 ng/mL  Prepare RBC     Status: None   Collection Time: 09/04/16  8:30 AM  Result Value Ref Range   Order Confirmation ORDER PROCESSED BY BLOOD BANK   Hemoglobin and hematocrit, blood     Status: Abnormal   Collection Time: 09/04/16 10:35 AM  Result Value Ref Range   Hemoglobin 6.7 (LL) 12.0 - 15.0 g/dL    Comment: REPEATED TO  VERIFY CRITICAL RESULT CALLED TO, READ BACK BY AND VERIFIED WITH: MOTLET, J RN AT 1054 BY HFLYNT 09/04/16    HCT 22.9 (L) 36.0 - 46.0 %    Dg Abd Acute W/chest  Result Date: 09/04/2016 CLINICAL DATA:  Abdominal pain and gastrointestinal bleeding beginning this morning. EXAM: DG ABDOMEN ACUTE W/ 1V CHEST COMPARISON:  CT chest and PA and lateral chest 08/25/2016. FINDINGS: Single-view of the chest demonstrates cardiomegaly without edema. Aortic atherosclerosis is noted. No consolidative process, pneumothorax or effusion. Two views of the abdomen show no free intraperitoneal air. The bowel gas pattern is nonobstructive. Left renal artery stent is noted. No acute bony abnormality. IMPRESSION: No acute finding chest or abdomen. Cardiomegaly. Atherosclerosis. Electronically Signed   By: Inge Rise M.D.   On: 09/04/2016 08:39    ROS Blood pressure (!) 133/51, pulse 85, temperature 98.1 F (36.7 C), temperature source Oral, resp. rate 17, height 5' (1.524 m), weight 163 lb 12.8 oz (74.3 kg), SpO2 100 %. Physical Exam  Assessment/Plan: GIB. . Will discuss with Dr. Laural Golden. Possible EGD.   Anjalina Bergevin W 09/04/2016, 1:29 PM

## 2016-09-04 NOTE — ED Triage Notes (Signed)
Pt woke up bleeding from bowel without stool this am. C/o abd pain. A/o. Nad. Mm dry. Slightly pale.

## 2016-09-04 NOTE — ED Notes (Signed)
Pt sleeping with sats 87%  . Around 94 when awake. Pt placed on 2 L [place] 02.

## 2016-09-04 NOTE — H&P (Signed)
History and Physical    [name] NWG:[id number] DOB: Jun 21, 1932 DOA: 09/04/2016  PCP: [name], MD   Patient coming from: Home  Chief Complaint: Bright red blood per rectum  HPI: [name] is a 83 y.o. female with medical history significant of AV malformation of GI tract, Carotid Artery Disease, Atrial fibrillation (on chronic anticoagulation with coumadin), Chronic diastolic CHF that presented with bright red blood per rectum that began this am.  Patient reports she got up to use the restroom and noticed a large amount of blood in the toilet.  She reports that previously her stools were brown.  She stopped taking IV iron a few weeks ago and mentions after that her stools were black up until the past few days.  She also voices that she has dizziness, lightheadedness with standing that began this am with the bloody bowel movement.  Denies chest pain, chest pressure, denies shortness of breath or increased work of breathing.  Has abdominal pain that is located in the lower abdomen but cannot pinpoint where.  Has chronically swollen legs.  Denies hematuria, vision changes.  Daughters voice patient has been intermittently confused.  Patient also voices she has had nosebleeds recently.  She mentions that she has required blood transfusions in the past- this will be her 13th.  ED Course: Patient H/H 6.6/23.0, platelets of 133, potassium slightly low at 3.2, Cr of 1.11, positive orthostatic vital signs, negative troponin, hemoccult positive, rectal exam with maroon colored stool.  Gastroenterology consulted by EDP.  TRH asked to admit for acute anemia 2/2 lower GI bleed.  INR of 3.02.  Review of Systems: As per HPI otherwise 10 point review of systems negative.    Past Medical History:  Diagnosis Date  . Angiodysplasia of stomach and duodenum   . AV malformation of gastrointestinal tract   . Carotid artery disease ([place])    RIght CEA 05/2004  . Chronic anticoagulation   .  Chronic atrial fibrillation ([place])   . Chronic blood loss anemia   . Chronic diastolic CHF (congestive heart failure) ([place])   . Coronary atherosclerosis of native coronary artery    a. RCA stent 2003 with 60% LCx, 60% OM1, 80% OM2 disease at that time.  . Essential hypertension, benign   . Gastric ulcer   . GI bleeding    Recurrent GIB (prior gastric ulcer/diverticulosis/AVM in 2017, gastric AVM s/p endoscopic therapy in 05/2016, recurrent GIB 06/2016 with AVM s/p therapy on endoscopy.  . Hyperlipidemia   . Lymphedema    a. in general, her LE edema appears to be a poor indicator of her total fluid status - chronic edema.  . Obesity   . Renal artery stenosis (HCC)    Bilateral renal artery stents 2004  . Tobacco abuse, in remission    40 pack year stopped in 1989    Past Surgical History:  Procedure Laterality Date  . APPENDECTOMY    . CAROTID ENDARTERECTOMY     Right in 2005, left in 2010  . COLONOSCOPY  Never   Declines  . COLONOSCOPY N/A 08/19/2015   Procedure: COLONOSCOPY;  Surgeon: Rogene Houston, MD;  [facility];  Service: Endoscopy;  Laterality: N/A;  7:30  . CORONARY ANGIOPLASTY    . ESOPHAGOGASTRODUODENOSCOPY N/A 08/19/2015   Procedure: ESOPHAGOGASTRODUODENOSCOPY (EGD);  Surgeon: Rogene Houston, MD;  [facility];  Service: Endoscopy;  Laterality: N/A;  . ESOPHAGOGASTRODUODENOSCOPY N/A 05/24/2016   Procedure: ESOPHAGOGASTRODUODENOSCOPY (EGD);  Surgeon: Mechele Dawley  Laural Golden, MD;  [facility];  Service: Endoscopy;  Laterality: N/A;  . ESOPHAGOGASTRODUODENOSCOPY N/A 06/19/2016   Procedure: ESOPHAGOGASTRODUODENOSCOPY (EGD);  Surgeon: Rogene Houston, MD;  [facility];  Service: Endoscopy;  Laterality: N/A;  . GIVENS CAPSULE STUDY N/A 06/20/2016   Procedure: GIVENS CAPSULE STUDY;  Surgeon: Rogene Houston, MD;  [facility];  Service: Endoscopy;  Laterality: N/A;     reports that she quit smoking about 29 years ago. Her smoking use  included Cigarettes. She has never used smokeless tobacco. She reports that she does not drink alcohol or use drugs.  Allergies  Allergen Reactions  . Diltiazem Other (See Comments)    Edema  . Verapamil Other (See Comments)    Diarrhea    Family History  Problem Relation Age of Onset  . Lung cancer Mother   . Cancer Mother   . Hypertension Daughter   . Hyperlipidemia Daughter   . Hypertension Son   . Hypertension Daughter   . Heart disease Maternal Grandmother      Prior to Admission medications   Medication Sig Start Date End Date Taking? Authorizing Provider  atorvastatin (LIPITOR) 80 MG tablet TAKE 1 TABLET EVERY NIGHT AT BEDTIME 05/07/16   Satira Sark, MD  bisoprolol (ZEBETA) 10 MG tablet take 1 tablet by mouth twice a day 02/09/16   Satira Sark, MD  metolazone (ZAROXOLYN) 2.5 MG tablet Take 1 tablet (2.5 mg total) by mouth every Monday, Wednesday, and Friday. 08/29/16 09/28/16  Elgergawy, Silver Huguenin, MD  pantoprazole (PROTONIX) 40 MG tablet Take 1 tablet (40 mg total) by mouth 2 (two) times daily before a meal. 05/25/16   Eber Jones, MD  Pediatric Multivitamins-Iron (FLINTSTONES PLUS IRON PO) Take by mouth 2 (two) times daily.    [provider]  potassium chloride SA (K-DUR,KLOR-CON) 20 MEQ tablet Please take 40 mg oral twice daily for 3 days, then continue with 20 mg oral twice daily 08/28/16   Elgergawy, Silver Huguenin, MD  terazosin (HYTRIN) 5 MG capsule take 1 capsule by mouth at bedtime 07/04/16   Satira Sark, MD  torsemide (DEMADEX) 20 MG tablet Take 4 tablets (80 mg total) by mouth 2 (two) times daily. 06/22/16 08/25/16  Charlie Pitter, PA-C  warfarin (COUMADIN) 5 MG tablet Take 1 tablet daily except 1/2 tablet on Tuesdays and Saturdays or as directed by [facility] 07/19/16   Satira Sark, MD    Physical Exam: Vitals:   09/04/16 0714 09/04/16 0716 09/04/16 0831  BP:  (!) 118/42 (!) 109/58  Pulse:  76   Resp:  18 14  Temp:  97.6 F  (36.4 C)   TempSrc:  Oral   SpO2:  98%   Weight: 73.9 kg (163 lb)    Height: 5\' 2"  (1.575 m)        Constitutional: NAD, calm, comfortable Vitals:   09/04/16 0714 09/04/16 0716 09/04/16 0831  BP:  (!) 118/42 (!) 109/58  Pulse:  76   Resp:  18 14  Temp:  97.6 F (36.4 C)   TempSrc:  Oral   SpO2:  98%   Weight: 73.9 kg (163 lb)    Height: 5\' 2"  (1.575 m)     Eyes: PERRL, lids and conjunctivae normal ENMT: Mucous membranes are moist. Posterior pharynx clear of any exudate or lesions.Normal dentition.  Neck: normal, supple, no masses, no thyromegaly Respiratory: clear to auscultation bilaterally, no wheezing, no crackles. Normal respiratory effort. No  accessory muscle use.  Cardiovascular: irregularly irregular, no murmurs / rubs / gallops. 2+ bilateral lower extremity edema distal from knee. 2+ pedal pulses.   Abdomen: no tenderness, no masses palpated. No hepatosplenomegaly. Bowel sounds positive.  Musculoskeletal: no clubbing / cyanosis. No joint deformity upper and lower extremities. Good ROM, no contractures. Normal muscle tone.  Skin: no rashes, lesions, ulcers. No induration Neurologic: CN 2-12 grossly intact. Sensation intact, DTR normal. Strength 5/5 in all 4.  Psychiatric: Normal judgment and insight. Alert and oriented x 3. Normal mood.     Labs on Admission: I have personally reviewed following labs and imaging studies  CBC:  Recent Labs Lab 09/04/16 0730  WBC 8.7  NEUTROABS 6.8  HGB 6.6*  HCT 23.0*  MCV 83.9  PLT 191*   Basic Metabolic Panel:  Recent Labs Lab 09/04/16 0737  NA 139  K 3.2*  CL 98*  CO2 32  GLUCOSE 127*  BUN 33*  CREATININE 1.11*  CALCIUM 8.3*   GFR: Estimated Creatinine Clearance: 37.4 mL/min (A) (by C-G formula based on SCr of 1.11 mg/dL (H)). Liver Function Tests:  Recent Labs Lab 09/04/16 0737  AST 33  ALT 17  ALKPHOS 64  BILITOT 1.5*  PROT 5.1*  ALBUMIN 2.6*    Recent Labs Lab 09/04/16 0737  LIPASE 37    No results for input(s): AMMONIA in the last 168 hours. Coagulation Profile:  Recent Labs Lab 09/04/16 0737  INR 3.02   Cardiac Enzymes:  Recent Labs Lab 09/04/16 0737  TROPONINI <0.03   BNP (last 3 results) No results for input(s): PROBNP in the last 8760 hours. HbA1C: No results for input(s): HGBA1C in the last 72 hours. CBG: No results for input(s): GLUCAP in the last 168 hours. Lipid Profile: No results for input(s): CHOL, HDL, LDLCALC, TRIG, CHOLHDL, LDLDIRECT in the last 72 hours. Thyroid Function Tests: No results for input(s): TSH, T4TOTAL, FREET4, T3FREE, THYROIDAB in the last 72 hours. Anemia Panel: No results for input(s): VITAMINB12, FOLATE, FERRITIN, TIBC, IRON, RETICCTPCT in the last 72 hours. Urine analysis:    Component Value Date/Time   COLORURINE YELLOW 06/17/2016 1508   APPEARANCEUR HAZY (A) 06/17/2016 1508   LABSPEC 1.009 06/17/2016 1508   PHURINE 6.0 06/17/2016 1508   GLUCOSEU NEGATIVE 06/17/2016 1508   HGBUR NEGATIVE 06/17/2016 1508   BILIRUBINUR NEGATIVE 06/17/2016 1508   KETONESUR NEGATIVE 06/17/2016 1508   PROTEINUR NEGATIVE 06/17/2016 1508   UROBILINOGEN 0.2 08/31/2009 1628   NITRITE NEGATIVE 06/17/2016 1508   LEUKOCYTESUR LARGE (A) 06/17/2016 1508   Sepsis Labs: !!!!!!!!!!!!!!!!!!!!!!!!!!!!!!!!!!!!!!!!!!!! @LABRCNTIP (procalcitonin:4,lacticidven:4) )No results found for this or any previous visit (from the past 240 hour(s)).   Radiological Exams on Admission: Dg Abd Acute W/chest  Result Date: 09/04/2016 CLINICAL DATA:  Abdominal pain and gastrointestinal bleeding beginning this morning. EXAM: DG ABDOMEN ACUTE W/ 1V CHEST COMPARISON:  CT chest and PA and lateral chest 08/25/2016. FINDINGS: Single-view of the chest demonstrates cardiomegaly without edema. Aortic atherosclerosis is noted. No consolidative process, pneumothorax or effusion. Two views of the abdomen show no free intraperitoneal air. The bowel gas pattern is nonobstructive.  Left renal artery stent is noted. No acute bony abnormality. IMPRESSION: No acute finding chest or abdomen. Cardiomegaly. Atherosclerosis. Electronically Signed   By: Inge Rise M.D.   On: 09/04/2016 08:39    EKG: Independently reviewed. Atrial fibrillation with PVC  Assessment/Plan Principal Problem:   Acute blood loss anemia Active Problems:   Chronic atrial fibrillation (HCC)   Coronary atherosclerosis of native coronary  artery   Chronic diastolic CHF (congestive heart failure) (HCC)   Anticoagulated on Coumadin   Acute renal failure superimposed on stage 3 chronic kidney disease (HCC)   GI bleed    Acute blood loss anemia 2/2 GIB - serial H/H - 2 large bore IV's - 2mg  Vitamin K given - GI consulted - holding anticoagulants - protonix drip - admit to SDU  Chronic atrial fibrillation - monitor HR - holding meds at this time 2/2 hypotension - may need to discuss risks vs. Benefits of chronic anticoagulation given recurrent GIB  Chronic diastolic CHF - monitor IVF  - no signs of volume overload at this time  Stage 3 CKD - repeat BMP in am - received IVF   DVT prophylaxis: SCDs only  Code Status: DNR  Family Communication: Two daughters are bedside  Disposition Plan: Likely home after workup  Consults called: Gastroenterology- Rehman  Admission status: Inpatient, SDU    Loretha Stapler MD [facility] Pager [phone]  If 7PM-7AM, please contact night-coverage [email or web address] Password [facility]  09/04/2016, 8:43 AM

## 2016-09-04 NOTE — Progress Notes (Signed)
Patient's family upset about patient's bloody stool and were concerned about what the plan was. One family member was particularly upset and demanded that I page Dr. Laural Golden and Dr. Oneida Alar because she wanted someone to come see this immediately. I calmly explained to her that I had paged Dr. Laural Golden and he would be up here as soon as possible. About 15 minutes later Dr. Laural Golden walked on the floor to see patient. Prior to this however, Dr. Olevia Perches NP had been in to see patient as had the hospitalist who knew patient had been having bloody stools. There were no other symptoms at the time of the stool .

## 2016-09-04 NOTE — ED Notes (Signed)
CRITICAL VALUE ALERT  Critical value received:  Hgb 6.6  Date of notification:  09/04/16  Time of notification:  0804  Critical value read back:Yes.    Nurse who received alert:  c Kassidie Hendriks rn:  Responding MD:  Dr Thurnell Garbe  Time MD responded:  [phone]

## 2016-09-05 ENCOUNTER — Encounter (HOSPITAL_COMMUNITY)
Admission: EM | Disposition: A | Discharge status: Home / Self Care | Payer: Self-pay | Source: Home / Self Care | Attending: Internal Medicine

## 2016-09-05 ENCOUNTER — Other Ambulatory Visit: Payer: Self-pay

## 2016-09-05 ENCOUNTER — Encounter (HOSPITAL_COMMUNITY): Payer: Self-pay

## 2016-09-05 DIAGNOSIS — I5032 Chronic diastolic (congestive) heart failure: Secondary | ICD-10-CM

## 2016-09-05 DIAGNOSIS — K31819 Angiodysplasia of stomach and duodenum without bleeding: Secondary | ICD-10-CM

## 2016-09-05 DIAGNOSIS — E876 Hypokalemia: Secondary | ICD-10-CM

## 2016-09-05 DIAGNOSIS — D649 Anemia, unspecified: Secondary | ICD-10-CM

## 2016-09-05 DIAGNOSIS — K920 Hematemesis: Secondary | ICD-10-CM

## 2016-09-05 DIAGNOSIS — K922 Gastrointestinal hemorrhage, unspecified: Secondary | ICD-10-CM

## 2016-09-05 DIAGNOSIS — I2510 Atherosclerotic heart disease of native coronary artery without angina pectoris: Secondary | ICD-10-CM

## 2016-09-05 DIAGNOSIS — N183 Chronic kidney disease, stage 3 unspecified: Secondary | ICD-10-CM

## 2016-09-05 DIAGNOSIS — I482 Chronic atrial fibrillation: Secondary | ICD-10-CM

## 2016-09-05 DIAGNOSIS — K3189 Other diseases of stomach and duodenum: Secondary | ICD-10-CM

## 2016-09-05 DIAGNOSIS — K921 Melena: Secondary | ICD-10-CM

## 2016-09-05 DIAGNOSIS — K228 Other specified diseases of esophagus: Secondary | ICD-10-CM

## 2016-09-05 HISTORY — PX: ESOPHAGOGASTRODUODENOSCOPY: SHX5428

## 2016-09-05 LAB — CBC
HCT: 27.4 % — ABNORMAL LOW (ref 36.0–46.0)
HEMOGLOBIN: 8.4 g/dL — AB (ref 12.0–15.0)
MCH: 26.2 pg (ref 26.0–34.0)
MCHC: 30.7 g/dL (ref 30.0–36.0)
MCV: 85.4 fL (ref 78.0–100.0)
PLATELETS: 131 10*3/uL — AB (ref 150–400)
RBC: 3.21 MIL/uL — ABNORMAL LOW (ref 3.87–5.11)
RDW: 22.4 % — ABNORMAL HIGH (ref 11.5–15.5)
WBC: 9.8 10*3/uL (ref 4.0–10.5)

## 2016-09-05 LAB — PROTIME-INR
INR: 1.89
PROTHROMBIN TIME: 22 s — AB (ref 11.4–15.2)

## 2016-09-05 LAB — BASIC METABOLIC PANEL
ANION GAP: 9 (ref 5–15)
BUN: 43 mg/dL — ABNORMAL HIGH (ref 6–20)
CALCIUM: 8.6 mg/dL — AB (ref 8.9–10.3)
CHLORIDE: 106 mmol/L (ref 101–111)
CO2: 29 mmol/L (ref 22–32)
Creatinine, Ser: 0.86 mg/dL (ref 0.44–1.00)
GFR calc non Af Amer: >60 mL/min (ref >60)
Glucose, Bld: 85 mg/dL (ref 65–99)
Potassium: 3.5 mmol/L (ref 3.5–5.1)
SODIUM: 144 mmol/L (ref 135–145)

## 2016-09-05 LAB — HEMOGLOBIN AND HEMATOCRIT, BLOOD
HCT: 27.3 % — ABNORMAL LOW (ref 36.0–46.0)
HCT: 28.4 % — ABNORMAL LOW (ref 36.0–46.0)
HEMATOCRIT: 27.7 % — AB (ref 36.0–46.0)
HEMOGLOBIN: 8.4 g/dL — AB (ref 12.0–15.0)
HEMOGLOBIN: 8.7 g/dL — AB (ref 12.0–15.0)
Hemoglobin: 8.8 g/dL — ABNORMAL LOW (ref 12.0–15.0)

## 2016-09-05 SURGERY — EGD (ESOPHAGOGASTRODUODENOSCOPY)
Anesthesia: Moderate Sedation

## 2016-09-05 MED ORDER — LIDOCAINE VISCOUS 2 % MT SOLN
OROMUCOSAL | Status: AC
  Filled 2016-09-05: qty 15

## 2016-09-05 MED ORDER — METOLAZONE 5 MG PO TABS
2.5000 mg | ORAL_TABLET | ORAL | Status: DC
  Filled 2016-09-05: qty 0.5

## 2016-09-05 MED ORDER — SIMETHICONE 40 MG/0.6ML PO SUSP
ORAL | Status: DC | PRN
  Administered 2016-09-05: 11:00:00

## 2016-09-05 MED ORDER — MIDAZOLAM HCL 5 MG/5ML IJ SOLN
INTRAMUSCULAR | Status: DC | PRN
  Administered 2016-09-05 (×2): 2 mg via INTRAVENOUS

## 2016-09-05 MED ORDER — MEPERIDINE HCL 50 MG/ML IJ SOLN
INTRAMUSCULAR | Status: DC | PRN
  Administered 2016-09-05 (×2): 25 mg via INTRAVENOUS

## 2016-09-05 MED ORDER — MEPERIDINE HCL 50 MG/ML IJ SOLN
INTRAMUSCULAR | Status: AC
  Filled 2016-09-05: qty 1

## 2016-09-05 MED ORDER — POTASSIUM CHLORIDE CRYS ER 20 MEQ PO TBCR
30.0000 meq | EXTENDED_RELEASE_TABLET | Freq: Once | ORAL | Status: AC
  Administered 2016-09-05: 16:00:00 30 meq via ORAL
  Filled 2016-09-05: qty 1

## 2016-09-05 MED ORDER — MIDAZOLAM HCL 5 MG/5ML IJ SOLN
INTRAMUSCULAR | Status: AC
  Filled 2016-09-05: qty 10

## 2016-09-05 MED ORDER — SODIUM CHLORIDE 0.9 % IV SOLN
INTRAVENOUS | Status: DC
  Administered 2016-09-05: 10:00:00 via INTRAVENOUS

## 2016-09-05 MED ORDER — PANTOPRAZOLE SODIUM 40 MG IV SOLR
INTRAVENOUS | Status: AC
  Filled 2016-09-05: qty 80

## 2016-09-05 MED ORDER — LIDOCAINE VISCOUS 2 % MT SOLN
OROMUCOSAL | Status: DC | PRN
  Administered 2016-09-05: 4 mL via OROMUCOSAL

## 2016-09-05 NOTE — Patient Outreach (Signed)
[facility]) Care Management  09/05/2016  KYRRA PRADA Dec 21, 1932 [id number]   EMMI: Heart failure Referral date: 09/03/16 Referral source: EMMI heart failure red alert Referral reason: New or worsening problems: YES Day # 2 Attempt #2  Telephone call to patient regarding EMMI heart failure red alert. Contact answering phone identified herself as patients daughter. Stated patient has been readmitted to the hospital.  PLAN; RNCM will refer patient to care management assistant to close due to being readmitted to the hospital and request heart failure EMMI calls be discontinued. RNCM will notify patients primary MD of closure.  RNCM notified  [facility] hospital liaisons of patients readmission.   Quinn Plowman RN,BSN,CCM [facility] Telephonic  [phone]

## 2016-09-05 NOTE — Care Management Note (Signed)
Case Management Note  Patient Details  Name: CYNDRA FEINBERG MRN: [id number] Date of Birth: 05-26-1932  Subjective/Objective: Adm with GIB. From home alone, ind PTA. Recent admission for CHF. Patient is active with [facility]. No HH or DME.  Enrolled in [place] transition calls last visit.             Action/Plan: Plans to return home with self care. CM will follow for needs.   Expected Discharge Date:                  Expected Discharge Plan:  Home/Self Care  In-House Referral:     Discharge planning Services  CM Consult  Post Acute Care Choice:    Choice offered to:     DME Arranged:    DME Agency:     HH Arranged:    HH Agency:     Status of Service:  In process, will continue to follow  If discussed at Long Length of Stay Meetings, dates discussed:    Additional Comments:  Fredericka Bottcher, Chauncey Reading, RN 09/05/2016, 1:02 PM

## 2016-09-05 NOTE — Consult Note (Signed)
   [facility] CM Inpatient Consult   09/05/2016  [name] 1933/04/09 [id number]   Patient is currently active with [place] Management services through [facility] but with readmission will be transitioned to [facility] Case Freight forwarder.  Our community based plan of care has focused on disease management and community resource support.  Patient will receive a post discharge transition of care call and will be evaluated for monthly home visits for assessments and disease process education.  Made Inpatient Case Manager aware that [place] Management following. Of note, [facility] Care Management services does not replace or interfere with any services that are needed or arranged by inpatient case management or social work.  For additional questions or referrals please contact:  Lundyn Coste RN, [facility] Liaison  [phone]) [place] [phone]) Toll free office

## 2016-09-05 NOTE — Progress Notes (Signed)
PROGRESS NOTE  [name] RDE:[id number] DOB: 11-08-1932 DOA: 09/04/2016 PCP: [name], MD  Brief History:  GI bleed, chronic atrial fibrillation, hyperlipidemia, hypertension and gastric and small bowel AVMs, right carotid stenosis status post CEA presenting with rectal bleeding on the day of admission. The patient had associated dizziness and lightheadedness after her bloody bowel movement. She had some crampy abdominal pain. The patient also had 1 episode of hematemesis prior to admission. Since admission, the patient has had 2 maroon-colored bowel movements on the evening of 09/04/2016 through 09/05/2016. The patient was transfused with 2 units PRBC. Upon presentation, the patient had mild hypotension with blood pressure 85/73 and hemoglobin 6.6.  Assessment/Plan: Acute blood loss anemia secondary to GI bleed -Suspect GI bleed from gastric or small bowel AVMs -Continue Protonix drip -Transfuse 2 units PRBC -Continue to trend hemoglobin -Appreciate GI consult -EGD planned 09/05/2016 -Recheck INR--patient given 2 mg vitamin K -Holding Coumadin -Long discussion with the patient's family regarding the risks, benefits of restarting Coumadin given that this is the third episode of GI bleed since February 2018  Symptomatically anemia -Transfuse 2 units PRBC -Trend hemoglobin  Chronic atrial fibrillation -Rate controlled -holding bisoprolol due to soft BP -Holding warfarin  Hypertension -Holding bisoprolol due to soft BP  CKD stage III -Baseline creatinine 6.3-1.4  Chronic diastolic CHF -97/05/6376 discharge weight 169 pounds -Daily weights -Appears euvolemic clinically -Restart Zaroxolyn  Hypokalemia -Replete -Check magnesium     Disposition Plan:   Home in 2-3 days  Family Communication:   Daughters updated at bedside  Consultants:  GI--Rehman  Code Status:  DNR  DVT Prophylaxis:  SCDs   Procedures: As Listed in Progress Note  Above  Antibiotics: None    Subjective: Patient denies fevers, chills, headache, chest pain, dyspnea, nausea, vomiting, diarrhea, abdominal pain, dysuria, hematuria, hematochezia, and melena.   Objective: Vitals:   09/05/16 0300 09/05/16 0400 09/05/16 0500 09/05/16 0600  BP:  (!) 114/45  (!) 110/42  Pulse: 79 82 87 76  Resp: 18 19 17 17   Temp:  99.1 F (37.3 C)    TempSrc:  Oral    SpO2: 100% 99% 100% 99%  Weight:      Height:        Intake/Output Summary (Last 24 hours) at 09/05/16 0740 Last data filed at 09/05/16 0600  Gross per 24 hour  Intake          1462.17 ml  Output              500 ml  Net           962.17 ml   Weight change:  Exam:   General:  Pt is alert, follows commands appropriately, not in acute distress  HEENT: No icterus, No thrush, No neck mass, [place]/AT  Cardiovascular: IRRR, S1/S2, no rubs, no gallops  Respiratory: bibasilar crackles, no wheeze  Abdomen: Soft/+BS, non tender, non distended, no guarding  Extremities: 1+LE edema, No lymphangitis, No petechiae, No rashes, no synovitis   Data Reviewed: I have personally reviewed following labs and imaging studies Basic Metabolic Panel:  Recent Labs Lab 09/04/16 0737  NA 139  K 3.2*  CL 98*  CO2 32  GLUCOSE 127*  BUN 33*  CREATININE 1.11*  CALCIUM 8.3*   Liver Function Tests:  Recent Labs Lab 09/04/16 0737  AST 33  ALT 17  ALKPHOS 64  BILITOT 1.5*  PROT 5.1*  ALBUMIN 2.6*  Recent Labs Lab 09/04/16 0737  LIPASE 37   No results for input(s): AMMONIA in the last 168 hours. Coagulation Profile:  Recent Labs Lab 09/04/16 0737 09/04/16 1715  INR 3.02 2.30   CBC:  Recent Labs Lab 09/04/16 0730 09/04/16 1035 09/04/16 1420 09/04/16 1715 09/04/16 2354 09/05/16 0404  WBC 8.7  --   --   --   --  9.8  NEUTROABS 6.8  --   --   --   --   --   HGB 6.6* 6.7* 7.2* 7.6* 8.4* 8.4*  HCT 23.0* 22.9* 24.0* 25.4* 27.3* 27.4*  MCV 83.9  --   --   --   --  85.4  PLT 133*   --   --   --   --  131*   Cardiac Enzymes:  Recent Labs Lab 09/04/16 0737  TROPONINI <0.03   BNP: Invalid input(s): POCBNP CBG: No results for input(s): GLUCAP in the last 168 hours. HbA1C: No results for input(s): HGBA1C in the last 72 hours. Urine analysis:    Component Value Date/Time   COLORURINE YELLOW 06/17/2016 1508   APPEARANCEUR HAZY (A) 06/17/2016 1508   LABSPEC 1.009 06/17/2016 1508   PHURINE 6.0 06/17/2016 1508   GLUCOSEU NEGATIVE 06/17/2016 1508   HGBUR NEGATIVE 06/17/2016 1508   BILIRUBINUR NEGATIVE 06/17/2016 1508   KETONESUR NEGATIVE 06/17/2016 1508   PROTEINUR NEGATIVE 06/17/2016 1508   UROBILINOGEN 0.2 08/31/2009 1628   NITRITE NEGATIVE 06/17/2016 1508   LEUKOCYTESUR LARGE (A) 06/17/2016 1508   Sepsis Labs: @LABRCNTIP (procalcitonin:4,lacticidven:4) ) Recent Results (from the past 240 hour(s))  MRSA PCR Screening     Status: Abnormal   Collection Time: 09/04/16 10:34 AM  Result Value Ref Range Status   MRSA by PCR INVALID RESULTS, SPECIMEN SENT FOR CULTURE (A) NEGATIVE Final    Comment: CALLED TO MOTLEY,J [id number] BY MATTHEWS, B 09/04/16        The GeneXpert MRSA Assay (FDA approved for NASAL specimens only), is one component of a comprehensive MRSA colonization surveillance program. It is not intended to diagnose MRSA infection nor to guide or monitor treatment for MRSA infections.      Scheduled Meds: . [START ON 09/07/2016] pantoprazole  40 mg Intravenous Q12H   Continuous Infusions: . sodium chloride    . pantoprozole (PROTONIX) infusion 8 mg/hr (09/05/16 0420)    Procedures/Studies: Dg Chest 2 View  Result Date: 08/25/2016 CLINICAL DATA:  Atrial fibrillation.  Tachycardia. EXAM: CHEST  2 VIEW COMPARISON:  June 17, 2016 FINDINGS: Cardiomegaly persists. The hila and mediastinum are normal. No pneumothorax. No pulmonary nodules, masses, or focal infiltrates. No overt edema is identified. IMPRESSION: No acute abnormalities.  Cardiomegaly.  Electronically Signed   By: Dorise Bullion III M.D   On: 08/25/2016 09:58   Ct Angio Chest Pe W Or Wo Contrast  Result Date: 08/25/2016 CLINICAL DATA:  Shortness of breath, lightheadedness, bilateral leg swelling. History of coronary artery disease, CHF, hypertension, lymphedema, renal artery stenosis with bilateral stents. EXAM: CT ANGIOGRAPHY CHEST WITH CONTRAST TECHNIQUE: Multidetector CT imaging of the chest was performed using the standard protocol during bolus administration of intravenous contrast. Multiplanar CT image reconstructions and MIPs were obtained to evaluate the vascular anatomy. CONTRAST:  80 cc Isovue 370 COMPARISON:  Chest CT angiogram dated 09/02/2009. FINDINGS: Cardiovascular: Some of the most peripheral segmental and subsegmental pulmonary artery branches are difficult to definitively characterize due to patient breathing motion artifact, however, there is no pulmonary embolism identified within the main, lobar or central  segmental pulmonary arteries bilaterally. No aortic aneurysm or dissection. Prominent atherosclerotic changes along the walls of the thoracic aorta and upper abdominal aorta. Cardiomegaly. No pericardial effusion. Coronary artery calcifications. Mediastinum/Nodes: No mass or enlarged lymph nodes seen within the mediastinum or perihilar regions. Esophagus is unremarkable. Trachea and central bronchi are unremarkable. Lungs/Pleura: Small bilateral pleural effusions. Mild atelectasis at each lung base. Lungs otherwise clear. Upper Abdomen: Distention of the IVC suggesting CHF. Small amount of ascites within the upper abdomen. Musculoskeletal: No acute or suspicious osseous finding. Ill-defined edema within the subcutaneous soft tissues of the upper abdomen suggesting anasarca. Review of the MIP images confirms the above findings. IMPRESSION: 1. No pulmonary embolism identified, with mild study limitations detailed above. 2. Cardiomegaly. Diffuse coronary artery  calcifications. Distension of the IVC suggesting CHF, but no corresponding pulmonary edema at this point. 3. Small bilateral pleural effusions. 4. Aortic atherosclerosis.  No aortic aneurysm or dissection. 5. Small amount of ascites seen within the upper abdomen. 6. Presumed anasarca within the subcutaneous soft tissues of the upper abdomen, further indication of volume overload. Electronically Signed   By: Franki Cabot M.D.   On: 08/25/2016 12:55   Dg Abd Acute W/chest  Result Date: 09/04/2016 CLINICAL DATA:  Abdominal pain and gastrointestinal bleeding beginning this morning. EXAM: DG ABDOMEN ACUTE W/ 1V CHEST COMPARISON:  CT chest and PA and lateral chest 08/25/2016. FINDINGS: Single-view of the chest demonstrates cardiomegaly without edema. Aortic atherosclerosis is noted. No consolidative process, pneumothorax or effusion. Two views of the abdomen show no free intraperitoneal air. The bowel gas pattern is nonobstructive. Left renal artery stent is noted. No acute bony abnormality. IMPRESSION: No acute finding chest or abdomen. Cardiomegaly. Atherosclerosis. Electronically Signed   By: Inge Rise M.D.   On: 09/04/2016 08:39    Kolter Reaver, DO  [facility] Pager [phone]  If 7PM-7AM, please contact night-coverage [email or web address] Password [email or web address] 09/05/2016, 7:40 AM   LOS: 1 day

## 2016-09-05 NOTE — Progress Notes (Signed)
  Subjective:  Patient has no complaints. According to her daughter she had 2 bloody bowel movements last night but these are small volume. She is hungry.  Objective: Blood pressure (!) 124/47, pulse 79, temperature 98.2 F (36.8 C), temperature source Oral, resp. rate 16, height 5' (1.524 m), weight 163 lb 12.8 oz (74.3 kg), SpO2 97 %. Patient is alert and in no acute distress.   Labs/studies Results:   Recent Labs  09/04/16 0730  09/04/16 1715 09/04/16 2354 09/05/16 0404  WBC 8.7  --   --   --  9.8  HGB 6.6*  < > 7.6* 8.4* 8.4*  HCT 23.0*  < > 25.4* 27.3* 27.4*  PLT 133*  --   --   --  131*  < > = values in this interval not displayed.  BMET   Recent Labs  09/04/16 0737  NA 139  K 3.2*  CL 98*  CO2 32  GLUCOSE 127*  BUN 33*  CREATININE 1.11*  CALCIUM 8.3*    LFT   Recent Labs  09/04/16 0737  PROT 5.1*  ALBUMIN 2.6*  AST 33  ALT 17  ALKPHOS 64  BILITOT 1.5*    PT/INR   Recent Labs  09/04/16 0737 09/04/16 1715  LABPROT 32.0* 25.7*  INR 3.02 2.30     Assessment:  #1.Upper GI bleed. This is the first time she has experienced hematemesis. On prior occasion she has presented with melena. Now she is also having bright red blood per rectum indicating rapid transit. She has received 2 units of PRBCs. She appears hemodynamically stable. She had multiple gastric AV malformations ablated on her last EGD in March 2018. She have more of the same. #2. Chronic anticoagulation. Warfarin is on hold. She received her grams of vitamin K sub-cutaneous be yesterday afternoon. INR from this morning is pending.  Recommendations:  Esophagogastroduodenoscopy this morning.

## 2016-09-05 NOTE — Progress Notes (Signed)
PT RECEIVED FROM ENDO . EASILY AROUSED. O2 AT 2L/MIN VIA [place]. VSS. IV'S INFUSING.

## 2016-09-05 NOTE — Op Note (Signed)
[facility] Patient Name: [name] Procedure Date: 09/05/2016 10:48 AM MRN: [id number] Date of Birth: 20-Jun-1932 Attending MD: Hildred Laser , MD CSN: [id number] Age: 83 Admit Type: Outpatient Procedure:                Upper GI endoscopy Indications:              Hematemesis, Hematochezia Providers:                Hildred Laser, MD, Otis Peak B. Gwenlyn Perking RN, RN, Aram Candela Referring MD:              Medicines:                Lidocaine spray, Meperidine 50 mg IV, Midazolam 4                            mg IV Complications:            No immediate complications. Estimated Blood Loss:     Estimated blood loss was minimal. Procedure:                Pre-Anesthesia Assessment:                           - Prior to the procedure, a History and Physical                            was performed, and patient medications and                            allergies were reviewed. The patient's tolerance of                            previous anesthesia was also reviewed. The risks                            and benefits of the procedure and the sedation                            options and risks were discussed with the patient.                            All questions were answered, and informed consent                            was obtained. Prior Anticoagulants: The patient                            last took Coumadin (warfarin) 2 days prior to the                            procedure. ASA Grade Assessment: III - A patient  with severe systemic disease. After reviewing the                            risks and benefits, the patient was deemed in                            satisfactory condition to undergo the procedure.                           After obtaining informed consent, the endoscope was                            passed under direct vision. Throughout the                            procedure, the patient's blood pressure,  pulse, and                            oxygen saturations were monitored continuously. The                            [id number] ([id number]) scope was introduced through the                            mouth, and advanced to the third part of duodenum.                            The upper GI endoscopy was accomplished without                            difficulty. The patient tolerated the procedure                            well. Scope In: 11:18:38 AM Scope Out: 11:32:36 AM Total Procedure Duration: 0 hours 13 minutes 58 seconds  Findings:      The examined esophagus was normal.      The Z-line was irregular and was found 40 cm from the incisors.      Four no bleeding angioectasias were found in the gastric fundus and in       the gastric body. Coagulation for bleeding prevention using argon plasma       was successful.      A healed ulcer was found in the gastric antrum. Clip noted at gc fundus       from prior procedure.      The exam of the stomach was otherwise normal.      The duodenal bulb, second portion of the duodenum, area of the minor       papilla and third portion of the duodenum were normal. Impression:               - Normal esophagus.                           - Z-line irregular, 40 cm from the incisors.                           -  Four non-bleeding angioectasias in the stomach.                            Treated with argon plasma coagulation (APC).                           - Scar in the gastric antrum.                           - Hemoclip at gastundus from prior therapeutic                            intervention.                           - Normal duodenal bulb, second portion of the                            duodenum, area of the minor papilla and third                            portion of the duodenum.                           - No specimens collected. Moderate Sedation:      Moderate (conscious) sedation was administered by the endoscopy nurse       and  supervised by the endoscopist. The following parameters were       monitored: oxygen saturation, heart rate, blood pressure, CO2       capnography and response to care. Total physician intraservice time was       18 minutes. Recommendation:           - Patient has a contact number available for                            emergencies. The signs and symptoms of potential                            delayed complications were discussed with the                            patient. Return to normal activities tomorrow.                            Written discharge instructions were provided to the                            patient.                           - Cardiac diet today.                           - Continue present medications.                           -  Resume Coumadin (warfarin) at prior dose in 5                            days.                           - Continue IV pantoprazole for another 24 hours.                           - CBC and INR in a.m. Procedure Code(s):        --- Professional ---                           [phone], Esophagogastroduodenoscopy, flexible,                            transoral; with control of bleeding, any method                           99152, Moderate sedation services provided by the                            same physician or other qualified health care                            professional performing the diagnostic or                            therapeutic service that the sedation supports,                            requiring the presence of an independent trained                            observer to assist in the monitoring of the                            patient's level of consciousness and physiological                            status; initial 15 minutes of intraservice time,                            patient age 60 years or older Diagnosis Code(s):        --- Professional ---                           K22.8, Other specified diseases of  esophagus                           K31.819, Angiodysplasia of stomach and duodenum                            without bleeding  K31.89, Other diseases of stomach and duodenum                           K92.0, Hematemesis                           K92.1, Melena (includes Hematochezia) CPT copyright 2016 [employer]. All rights reserved. The codes documented in this report are preliminary and upon coder review may  be revised to meet current compliance requirements. Hildred Laser, MD Hildred Laser, MD 09/05/2016 11:51:39 AM This report has been signed electronically. Number of Addenda: 0

## 2016-09-06 ENCOUNTER — Encounter (HOSPITAL_COMMUNITY)
Admission: RE | Admit: 2016-09-06 | Discharge: 2016-09-06 | Disposition: A | Discharge status: Home / Self Care | Payer: Medicare Other | Source: Ambulatory Visit | Attending: Internal Medicine | Admitting: Internal Medicine

## 2016-09-06 ENCOUNTER — Other Ambulatory Visit: Payer: Self-pay | Admitting: *Deleted

## 2016-09-06 DIAGNOSIS — E876 Hypokalemia: Secondary | ICD-10-CM

## 2016-09-06 DIAGNOSIS — Z5181 Encounter for therapeutic drug level monitoring: Secondary | ICD-10-CM

## 2016-09-06 DIAGNOSIS — N183 Chronic kidney disease, stage 3 (moderate): Secondary | ICD-10-CM

## 2016-09-06 DIAGNOSIS — Z7901 Long term (current) use of anticoagulants: Secondary | ICD-10-CM

## 2016-09-06 LAB — IRON AND TIBC
IRON: 41 ug/dL (ref 28–170)
Saturation Ratios: 13 % (ref 10.4–31.8)
TIBC: 318 ug/dL (ref 250–450)
UIBC: 277 ug/dL

## 2016-09-06 LAB — PREPARE RBC (CROSSMATCH)

## 2016-09-06 LAB — PROTIME-INR
INR: 1.53
Prothrombin Time: 18.5 seconds — ABNORMAL HIGH (ref 11.4–15.2)

## 2016-09-06 LAB — MRSA CULTURE: Culture: NOT DETECTED

## 2016-09-06 LAB — BASIC METABOLIC PANEL
Anion gap: 7 (ref 5–15)
BUN: 25 mg/dL — AB (ref 6–20)
CALCIUM: 8.7 mg/dL — AB (ref 8.9–10.3)
CHLORIDE: 105 mmol/L (ref 101–111)
CO2: 28 mmol/L (ref 22–32)
CREATININE: 0.9 mg/dL (ref 0.44–1.00)
GFR calc non Af Amer: 58 mL/min — ABNORMAL LOW (ref >60)
Glucose, Bld: 100 mg/dL — ABNORMAL HIGH (ref 65–99)
Potassium: 3.5 mmol/L (ref 3.5–5.1)
SODIUM: 140 mmol/L (ref 135–145)

## 2016-09-06 LAB — CBC
HCT: 27 % — ABNORMAL LOW (ref 36.0–46.0)
Hemoglobin: 8.3 g/dL — ABNORMAL LOW (ref 12.0–15.0)
MCH: 26.7 pg (ref 26.0–34.0)
MCHC: 30.7 g/dL (ref 30.0–36.0)
MCV: 86.8 fL (ref 78.0–100.0)
PLATELETS: 148 10*3/uL — AB (ref 150–400)
RBC: 3.11 MIL/uL — ABNORMAL LOW (ref 3.87–5.11)
RDW: 23.7 % — AB (ref 11.5–15.5)
WBC: 9.2 10*3/uL (ref 4.0–10.5)

## 2016-09-06 LAB — FERRITIN: FERRITIN: 298 ng/mL (ref 11–307)

## 2016-09-06 LAB — MAGNESIUM: Magnesium: 1.9 mg/dL (ref 1.7–2.4)

## 2016-09-06 MED ORDER — DIPHENHYDRAMINE HCL 25 MG PO CAPS
25.0000 mg | ORAL_CAPSULE | Freq: Once | ORAL | Status: AC
  Administered 2016-09-06: 25 mg via ORAL
  Filled 2016-09-06: qty 1

## 2016-09-06 MED ORDER — SODIUM CHLORIDE 0.9 % IV SOLN
Freq: Once | INTRAVENOUS | Status: AC
  Administered 2016-09-06: 10:00:00 via INTRAVENOUS

## 2016-09-06 MED ORDER — BISOPROLOL FUMARATE 5 MG PO TABS: 5.0000 mg | ORAL_TABLET | Freq: Two times a day (BID) | ORAL | 0 refills | Status: DC

## 2016-09-06 MED ORDER — ACETAMINOPHEN 325 MG PO TABS
650.0000 mg | ORAL_TABLET | Freq: Once | ORAL | Status: AC
  Administered 2016-09-06: 650 mg via ORAL
  Filled 2016-09-06: qty 2

## 2016-09-06 MED ORDER — BISOPROLOL FUMARATE 5 MG PO TABS: 5.0000 mg | ORAL_TABLET | Freq: Every day | ORAL | 0 refills | Status: DC

## 2016-09-06 MED ORDER — BISOPROLOL FUMARATE 5 MG PO TABS
2.5000 mg | ORAL_TABLET | Freq: Every day | ORAL | Status: DC
  Administered 2016-09-06: 2.5 mg via ORAL
  Filled 2016-09-06 (×2): qty 0.5

## 2016-09-06 MED ORDER — BISOPROLOL FUMARATE 5 MG PO TABS
5.0000 mg | ORAL_TABLET | Freq: Every day | ORAL | Status: DC
Start: 2016-09-07 — End: 2016-09-06
  Filled 2016-09-06: qty 1

## 2016-09-06 MED ORDER — BISOPROLOL FUMARATE 5 MG PO TABS
2.5000 mg | ORAL_TABLET | Freq: Once | ORAL | Status: DC
  Filled 2016-09-06: qty 0.5

## 2016-09-06 MED ORDER — FUROSEMIDE 10 MG/ML IJ SOLN
20.0000 mg | Freq: Once | INTRAMUSCULAR | Status: AC
  Administered 2016-09-06: 20 mg via INTRAVENOUS
  Filled 2016-09-06: qty 2

## 2016-09-06 MED ORDER — PANTOPRAZOLE SODIUM 40 MG PO TBEC
40.0000 mg | DELAYED_RELEASE_TABLET | Freq: Two times a day (BID) | ORAL | Status: DC
  Administered 2016-09-06: 40 mg via ORAL
  Filled 2016-09-06: qty 1

## 2016-09-06 NOTE — Discharge Summary (Signed)
Physician Discharge Summary  [name] TWS:[id number] DOB: 01-14-1933 DOA: 09/04/2016  PCP: [name], MD  Admit date: 09/04/2016 Discharge date: 09/06/2016  Admitted From: Home Disposition:  Home   Recommendations for Outpatient Follow-up:  1. Follow up with PCP in 1-2 weeks 2. Please obtain BMP/CBC in one week    Discharge Condition: Stable CODE STATUS: DNR Diet recommendation: Heart Healthy   Brief/Interim Summary: GI bleed, chronic atrial fibrillation, hyperlipidemia, hypertension and gastric and small bowel AVMs, right carotid stenosis status post CEA presenting with rectal bleeding on the day of admission. The patient had associated dizziness and lightheadedness after her bloody bowel movement. She had some crampy abdominal pain. The patient also had 1 episode of hematemesis prior to admission. Since admission, the patient has had 2 maroon-colored bowel movements on the evening of 09/04/2016 through 09/05/2016. The patient was transfused with 2 units PRBC. Upon presentation, the patient had mild hypotension with blood pressure 85/73 and hemoglobin 6.6. 05/22/2016 EGD showed coffee grounds red blood in the gastric fundus and body with single bleeding AVM treated with APC. Hemoclip placed at that time.  06/19/2016 EGD revealed 5 gastric body and antral nonbleeding AVMs.  06/20/2016 capsule endoscopy showed 3 small bowel AVMs.  08/19/2015 colonoscopy showed 3 and ectasia is in the ascending colon with 2 sessile polyps in the ascending colon.  Discharge Diagnoses:  Acute blood loss anemia secondary to GI bleed -Suspect GI bleed from gastric or small bowel AVMs -Continue Protonix drip-->po protonix -Transfuse 2 units PRBC -Continue to trend hemoglobin--Hgb 8.3 on day of d/c-->Dr. Laural Golden requested one additional unit PRBC after seeing 8.3 result -baseline Hgb ~8 -Appreciate GI consult-->EGD on 09/05/16-->Four nonbleeding angiectasias treated with APC, healed ulcer in the  gastric antrum -EGD planned 09/05/2016 -Recheck INR--patient given 2 mg vitamin K -Holding Coumadin -Long discussion with the patient's family regarding the risks, benefits of restarting Coumadin given that this is the third episode of GI bleed since February 2018 -after discussion with family and patient, they wanted to restart warfarin--no bridge needed as pt has not had stroke -restart warfarin on 5//28/18 per GI recommendations  Symptomatically anemia -Transfused 3 units PRBC for the admission -Trend hemoglobin -Hgb 8.3 on day of d/c  Chronic atrial fibrillation -Rate controlled -holding bisoprolol due to soft BP-->restarted at lower dose 5 mg bid -Holding warfarin during hospitalizationas discussed above  Hypertension -Holding bisoprolol due to soft BP -bisoprolol started at lower dose at time of d/c due to soft BP--5 mg bid  CKD stage III -Baseline creatinine 0.0-1.7  Chronic diastolic CHF -49/44/9675 discharge weight 169 pounds -Daily weights -Appears euvolemic clinically -Restart Zaroxolyn -restart torsemide  Hypokalemia -Replete -Check magnesium--1.9    Discharge Instructions  Discharge Instructions    AMB Referral to [place] [facility]    Complete by:  As directed    Reason for consult:  [facility] discharge follow up   Diagnoses of:  Heart Failure   Expected date of contact:  1-3 days (reserved for hospital discharges)   Please assign patient for community nurse to engage for transition of care calls and evaluate for monthly home visits. For questions please contact:   Janci Minor RN, [facility] Liaison [phone])   Diet - low sodium heart healthy    Complete by:  As directed    Increase activity slowly    Complete by:  As directed      Allergies as of 09/06/2016      Reactions   Diltiazem Other (  See Comments)   Edema   Verapamil Other (See Comments)   Diarrhea      Medication List    STOP taking these medications     terazosin 5 MG capsule Commonly known as:  HYTRIN     TAKE these medications   acetaminophen 500 MG tablet Commonly known as:  TYLENOL Take 500 mg by mouth every 6 (six) hours as needed for headache.   atorvastatin 80 MG tablet Commonly known as:  LIPITOR TAKE 1 TABLET EVERY NIGHT AT BEDTIME   bisoprolol 5 MG tablet Commonly known as:  ZEBETA Take 1 tablet (5 mg total) by mouth 2 (two) times daily. What changed:  medication strength  See the new instructions.   FLINTSTONES PLUS IRON PO Take 1 tablet by mouth 2 (two) times daily.   metolazone 2.5 MG tablet Commonly known as:  ZAROXOLYN Take 1 tablet (2.5 mg total) by mouth every Monday, Wednesday, and Friday.   pantoprazole 40 MG tablet Commonly known as:  PROTONIX Take 1 tablet (40 mg total) by mouth 2 (two) times daily before a meal.   potassium chloride SA 20 MEQ tablet Commonly known as:  K-DUR,KLOR-CON Please take 40 mg oral twice daily for 3 days, then continue with 20 mg oral twice daily What changed:  how much to take  how to take this  when to take this  additional instructions   torsemide 20 MG tablet Commonly known as:  DEMADEX Take 4 tablets (80 mg total) by mouth 2 (two) times daily. What changed:  how much to take  when to take this  additional instructions   warfarin 5 MG tablet Commonly known as:  COUMADIN Take 1 tablet daily except 1/2 tablet on Tuesdays and Saturdays or as directed by [facility] Clinic What changed:  how much to take  how to take this  when to take this  additional instructions       Allergies  Allergen Reactions  . Diltiazem Other (See Comments)    Edema  . Verapamil Other (See Comments)    Diarrhea    Consultations:  GI--Rehman   Procedures/Studies: Dg Chest 2 View  Result Date: 08/25/2016 CLINICAL DATA:  Atrial fibrillation.  Tachycardia. EXAM: CHEST  2 VIEW COMPARISON:  June 17, 2016 FINDINGS: Cardiomegaly persists. The hila and mediastinum  are normal. No pneumothorax. No pulmonary nodules, masses, or focal infiltrates. No overt edema is identified. IMPRESSION: No acute abnormalities.  Cardiomegaly. Electronically Signed   By: Dorise Bullion III M.D   On: 08/25/2016 09:58   Ct Angio Chest Pe W Or Wo Contrast  Result Date: 08/25/2016 CLINICAL DATA:  Shortness of breath, lightheadedness, bilateral leg swelling. History of coronary artery disease, CHF, hypertension, lymphedema, renal artery stenosis with bilateral stents. EXAM: CT ANGIOGRAPHY CHEST WITH CONTRAST TECHNIQUE: Multidetector CT imaging of the chest was performed using the standard protocol during bolus administration of intravenous contrast. Multiplanar CT image reconstructions and MIPs were obtained to evaluate the vascular anatomy. CONTRAST:  80 cc Isovue 370 COMPARISON:  Chest CT angiogram dated 09/02/2009. FINDINGS: Cardiovascular: Some of the most peripheral segmental and subsegmental pulmonary artery branches are difficult to definitively characterize due to patient breathing motion artifact, however, there is no pulmonary embolism identified within the main, lobar or central segmental pulmonary arteries bilaterally. No aortic aneurysm or dissection. Prominent atherosclerotic changes along the walls of the thoracic aorta and upper abdominal aorta. Cardiomegaly. No pericardial effusion. Coronary artery calcifications. Mediastinum/Nodes: No mass or enlarged lymph nodes seen within the  mediastinum or perihilar regions. Esophagus is unremarkable. Trachea and central bronchi are unremarkable. Lungs/Pleura: Small bilateral pleural effusions. Mild atelectasis at each lung base. Lungs otherwise clear. Upper Abdomen: Distention of the IVC suggesting CHF. Small amount of ascites within the upper abdomen. Musculoskeletal: No acute or suspicious osseous finding. Ill-defined edema within the subcutaneous soft tissues of the upper abdomen suggesting anasarca. Review of the MIP images confirms the  above findings. IMPRESSION: 1. No pulmonary embolism identified, with mild study limitations detailed above. 2. Cardiomegaly. Diffuse coronary artery calcifications. Distension of the IVC suggesting CHF, but no corresponding pulmonary edema at this point. 3. Small bilateral pleural effusions. 4. Aortic atherosclerosis.  No aortic aneurysm or dissection. 5. Small amount of ascites seen within the upper abdomen. 6. Presumed anasarca within the subcutaneous soft tissues of the upper abdomen, further indication of volume overload. Electronically Signed   By: Franki Cabot M.D.   On: 08/25/2016 12:55   Dg Abd Acute W/chest  Result Date: 09/04/2016 CLINICAL DATA:  Abdominal pain and gastrointestinal bleeding beginning this morning. EXAM: DG ABDOMEN ACUTE W/ 1V CHEST COMPARISON:  CT chest and PA and lateral chest 08/25/2016. FINDINGS: Single-view of the chest demonstrates cardiomegaly without edema. Aortic atherosclerosis is noted. No consolidative process, pneumothorax or effusion. Two views of the abdomen show no free intraperitoneal air. The bowel gas pattern is nonobstructive. Left renal artery stent is noted. No acute bony abnormality. IMPRESSION: No acute finding chest or abdomen. Cardiomegaly. Atherosclerosis. Electronically Signed   By: Inge Rise M.D.   On: 09/04/2016 08:39        Discharge Exam: Vitals:   09/06/16 1000 09/06/16 1015  BP: (!) 118/52 (!) 117/58  Pulse: (!) 110 (!) 104  Resp: (!) 24 (!) 21  Temp: 98.1 F (36.7 C) 98 F (36.7 C)   Vitals:   09/06/16 0930 09/06/16 0945 09/06/16 1000 09/06/16 1015  BP: (!) 125/54  (!) 118/52 (!) 117/58  Pulse: 100 (!) 101 (!) 110 (!) 104  Resp: 18 (!) 21 (!) 24 (!) 21  Temp:   98.1 F (36.7 C) 98 F (36.7 C)  TempSrc:   Oral Oral  SpO2: 93% 93% 92% 96%  Weight:      Height:        General: Pt is alert, awake, not in acute distress Cardiovascular: RRR, S1/S2 +, no rubs, no gallops Respiratory: CTA bilaterally, no wheezing, no  rhonchi Abdominal: Soft, NT, ND, bowel sounds + Extremities: no edema, no cyanosis   The results of significant diagnostics from this hospitalization (including imaging, microbiology, ancillary and laboratory) are listed below for reference.    Significant Diagnostic Studies: Dg Chest 2 View  Result Date: 08/25/2016 CLINICAL DATA:  Atrial fibrillation.  Tachycardia. EXAM: CHEST  2 VIEW COMPARISON:  June 17, 2016 FINDINGS: Cardiomegaly persists. The hila and mediastinum are normal. No pneumothorax. No pulmonary nodules, masses, or focal infiltrates. No overt edema is identified. IMPRESSION: No acute abnormalities.  Cardiomegaly. Electronically Signed   By: Dorise Bullion III M.D   On: 08/25/2016 09:58   Ct Angio Chest Pe W Or Wo Contrast  Result Date: 08/25/2016 CLINICAL DATA:  Shortness of breath, lightheadedness, bilateral leg swelling. History of coronary artery disease, CHF, hypertension, lymphedema, renal artery stenosis with bilateral stents. EXAM: CT ANGIOGRAPHY CHEST WITH CONTRAST TECHNIQUE: Multidetector CT imaging of the chest was performed using the standard protocol during bolus administration of intravenous contrast. Multiplanar CT image reconstructions and MIPs were obtained to evaluate the vascular anatomy. CONTRAST:  80 cc Isovue 370 COMPARISON:  Chest CT angiogram dated 09/02/2009. FINDINGS: Cardiovascular: Some of the most peripheral segmental and subsegmental pulmonary artery branches are difficult to definitively characterize due to patient breathing motion artifact, however, there is no pulmonary embolism identified within the main, lobar or central segmental pulmonary arteries bilaterally. No aortic aneurysm or dissection. Prominent atherosclerotic changes along the walls of the thoracic aorta and upper abdominal aorta. Cardiomegaly. No pericardial effusion. Coronary artery calcifications. Mediastinum/Nodes: No mass or enlarged lymph nodes seen within the mediastinum or perihilar  regions. Esophagus is unremarkable. Trachea and central bronchi are unremarkable. Lungs/Pleura: Small bilateral pleural effusions. Mild atelectasis at each lung base. Lungs otherwise clear. Upper Abdomen: Distention of the IVC suggesting CHF. Small amount of ascites within the upper abdomen. Musculoskeletal: No acute or suspicious osseous finding. Ill-defined edema within the subcutaneous soft tissues of the upper abdomen suggesting anasarca. Review of the MIP images confirms the above findings. IMPRESSION: 1. No pulmonary embolism identified, with mild study limitations detailed above. 2. Cardiomegaly. Diffuse coronary artery calcifications. Distension of the IVC suggesting CHF, but no corresponding pulmonary edema at this point. 3. Small bilateral pleural effusions. 4. Aortic atherosclerosis.  No aortic aneurysm or dissection. 5. Small amount of ascites seen within the upper abdomen. 6. Presumed anasarca within the subcutaneous soft tissues of the upper abdomen, further indication of volume overload. Electronically Signed   By: Franki Cabot M.D.   On: 08/25/2016 12:55   Dg Abd Acute W/chest  Result Date: 09/04/2016 CLINICAL DATA:  Abdominal pain and gastrointestinal bleeding beginning this morning. EXAM: DG ABDOMEN ACUTE W/ 1V CHEST COMPARISON:  CT chest and PA and lateral chest 08/25/2016. FINDINGS: Single-view of the chest demonstrates cardiomegaly without edema. Aortic atherosclerosis is noted. No consolidative process, pneumothorax or effusion. Two views of the abdomen show no free intraperitoneal air. The bowel gas pattern is nonobstructive. Left renal artery stent is noted. No acute bony abnormality. IMPRESSION: No acute finding chest or abdomen. Cardiomegaly. Atherosclerosis. Electronically Signed   By: Inge Rise M.D.   On: 09/04/2016 08:39     Microbiology: Recent Results (from the past 240 hour(s))  MRSA culture     Status: None   Collection Time: 09/04/16 10:04 AM  Result Value Ref  Range Status   Specimen Description NOSE  Final   Special Requests NONE  Final   Culture   Final    NO MRSA DETECTED Performed at [facility], [address] [place], [place] [address]    Report Status 09/06/2016 FINAL  Final  MRSA PCR Screening     Status: Abnormal   Collection Time: 09/04/16 10:34 AM  Result Value Ref Range Status   MRSA by PCR INVALID RESULTS, SPECIMEN SENT FOR CULTURE (A) NEGATIVE Final    Comment: CALLED TO MOTLEY,J [id number] BY MATTHEWS, B 09/04/16        The GeneXpert MRSA Assay (FDA approved for NASAL specimens only), is one component of a comprehensive MRSA colonization surveillance program. It is not intended to diagnose MRSA infection nor to guide or monitor treatment for MRSA infections.      Labs: Basic Metabolic Panel:  Recent Labs Lab 09/04/16 0737 09/05/16 0404 09/06/16 0419  NA 139 144 140  K 3.2* 3.5 3.5  CL 98* 106 105  CO2 32 29 28  GLUCOSE 127* 85 100*  BUN 33* 43* 25*  CREATININE 1.11* 0.86 0.90  CALCIUM 8.3* 8.6* 8.7*  MG  --   --  1.9  Liver Function Tests:  Recent Labs Lab 09/04/16 0737  AST 33  ALT 17  ALKPHOS 64  BILITOT 1.5*  PROT 5.1*  ALBUMIN 2.6*    Recent Labs Lab 09/04/16 0737  LIPASE 37   No results for input(s): AMMONIA in the last 168 hours. CBC:  Recent Labs Lab 09/04/16 0730  09/04/16 2354 09/05/16 0404 09/05/16 0825 09/05/16 1438 09/06/16 0419  WBC 8.7  --   --  9.8  --   --  9.2  NEUTROABS 6.8  --   --   --   --   --   --   HGB 6.6*  < > 8.4* 8.4* 8.7* 8.8* 8.3*  HCT 23.0*  < > 27.3* 27.4* 27.7* 28.4* 27.0*  MCV 83.9  --   --  85.4  --   --  86.8  PLT 133*  --   --  131*  --   --  148*  < > = values in this interval not displayed. Cardiac Enzymes:  Recent Labs Lab 09/04/16 0737  TROPONINI <0.03   BNP: Invalid input(s): POCBNP CBG: No results for input(s): GLUCAP in the last 168 hours.  Time coordinating discharge:  Greater than 30 minutes  Signed:  Garry Nicolini,  DO [facility] Pager: [phone] 09/06/2016, 12:27 PM

## 2016-09-06 NOTE — Progress Notes (Signed)
1305 d/c instructions and paperwork given to patient and patient's daughter. IV catheters removed from patient's LEFT hand and LEFT upper arm, intact w/no s/s of infiltration/infection noted at this time. Monitor wiring removed from patient and patient assisted with getting dressed. Daughter to transport patient home.

## 2016-09-06 NOTE — Progress Notes (Signed)
  Subjective:  Patient has no complaints. She denies abdominal pain nausea or vomiting. According to her daughter she had 2 bowel movements last night and these were small.  Objective: Blood pressure (!) 114/51, pulse 100, temperature 98.2 F (36.8 C), resp. rate (!) 21, height 5' (1.524 m), weight 168 lb 10.4 oz (76.5 kg), SpO2 90 %. Patient is alert and in no acute distress. Abdomen is full but soft and nontender without organomegaly or masses.  Labs/studies Results:   Recent Labs  09/04/16 0730  09/05/16 0404 09/05/16 0825 09/05/16 1438 09/06/16 0419  WBC 8.7  --  9.8  --   --  9.2  HGB 6.6*  < > 8.4* 8.7* 8.8* 8.3*  HCT 23.0*  < > 27.4* 27.7* 28.4* 27.0*  PLT 133*  --  131*  --   --  148*  < > = values in this interval not displayed.  BMET   Recent Labs  09/04/16 0737 09/05/16 0404 09/06/16 0419  NA 139 144 140  K 3.2* 3.5 3.5  CL 98* 106 105  CO2 32 29 28  GLUCOSE 127* 85 100*  BUN 33* 43* 25*  CREATININE 1.11* 0.86 0.90  CALCIUM 8.3* 8.6* 8.7*    LFT   Recent Labs  09/04/16 0737  PROT 5.1*  ALBUMIN 2.6*  AST 33  ALT 17  ALKPHOS 64  BILITOT 1.5*    PT/INR   Recent Labs  09/05/16 0825 09/06/16 0419  LABPROT 22.0* 18.5*  INR 1.89 1.53     Assessment:  #1. Upper GI bleed secondary to gastric AV malformations in the setting of chronic anticoagulation. Status post therapeutic EGD yesterday. She has received 2 units of PRBCs so far. Hemoglobin has dropped some since yesterday. She does not appear to be bleeding anymore. #2. Iron deficiency anemia secondary to chronic/intermittent GI bleed.   Recommendations:  As discussed with Dr. Carles Collet she will be given 1 unit of PRBCs after which she should be able to go home today. Warfarin can be resumed on 09/10/2016. She will have INR checked at the time of office visit with Dr. Johnny Bridge next week. Will check H&H next week.

## 2016-09-06 NOTE — Care Management Important Message (Signed)
Important Message  Patient Details  Name: [name] MRN: [id number] Date of Birth: 1932/09/10   Medicare Important Message Given:  Yes    Blayke Cordrey, Chauncey Reading, RN 09/06/2016, 1:12 PM

## 2016-09-06 NOTE — Patient Outreach (Signed)
[facility] [place] [facility]) Care Management  09/06/2016  KAELYNNE CHRISTLEY Nov 02, 1932 [id number]  Transition of care (TOC)  [facility] CM called and spoke with Mrs Vermeer about [facility] referral from hospital for transition of care.  Telephone difficulties noted Mrs Rodocker informed [facility] CM she did not "want anyone coming to see me.  My daughters take good care of me but thank you for calling" [facility] CM returned the call with better telephone reception and spoke with Mrs Steinmeyer, began [facility] assessment, and also asked permission to speak with one of her daughters.  She referred [facility] CM to [place] and [facility] CM left [facility] CM mobile number for [facility]. [name] returned a call to [place] CM reviewed [facility] CM referral and discussed the differences between home health nurses and [facility] CMs.  [name] states Mrs Kaczmarek has her sister staying with her tonight but reports Mrs Warren would benefit from [facility] CM services because of compliance concerns.  [name] answered more questions for [facility] assessment and requests to speak with Mrs Bulman to see if [facility] CM could visit on this week.  Pending a return call from [facility] to continue to engage with Mrs Minkoff for [facility] services and possible home visits   [place]. Lavina Hamman, RN, BSN, [facility] [phone]   ver

## 2016-09-06 NOTE — Patient Outreach (Signed)
[place] [facility]) Care Management  09/06/2016  PHILANA YOUNIS [birth year] [id number]   Care coordination  Return call from [name] to state the Mrs Greb and another daughter have agreed and would like [facility] CM to visit her tomorrow for home visit.  Plans to see Mrs Rua for home visit this week, complete TOC assessment and schedule monthly home visits per referral request if Mrs Wooley agrees  Joelene Millin L. Lavina Hamman, RN, BSN, [place] Care Management [phone]

## 2016-09-06 NOTE — Care Management Note (Signed)
Case Management Note  Patient Details  Name: YINA RIVIERE MRN: [id number] Date of Birth: [birth year]   Expected Discharge Date:  09/06/16               Expected Discharge Plan:  Home/Self Care  In-House Referral:     Discharge planning Services  CM Consult  Post Acute Care Choice:    Choice offered to:     DME Arranged:    DME Agency:     HH Arranged:    [place] Agency:     Status of Service:  Completed, signed off  If discussed at [employer] of Stay Meetings, dates discussed:    Additional Comments: Patient discharging home. No CM needs .  Flem Enderle, Chauncey Reading, RN 09/06/2016, 1:12 PM

## 2016-09-07 ENCOUNTER — Other Ambulatory Visit: Payer: Self-pay | Admitting: *Deleted

## 2016-09-07 LAB — TYPE AND SCREEN
ABO/RH(D): O POS
ANTIBODY SCREEN: NEGATIVE
UNIT DIVISION: 0
Unit division: 0
Unit division: 0

## 2016-09-07 LAB — BPAM RBC
BLOOD PRODUCT EXPIRATION DATE: 201806112359
BLOOD PRODUCT EXPIRATION DATE: 201806112359
Blood Product Expiration Date: 201806112359
ISSUE DATE / TIME: 201805221100
ISSUE DATE / TIME: 201805222023
ISSUE DATE / TIME: 201805240950
UNIT TYPE AND RH: 5100
Unit Type and Rh: 5100
Unit Type and Rh: 5100

## 2016-09-07 NOTE — Patient Outreach (Signed)
[facility]) Care Management   09/11/2016  [name] [birth year] [id number]  [name] is an 83 y.o. female with a PMH of PVD, HTN, hyperlipidemia, CKD/ACRF stage 3, AV malformation of GI tract, Carotid Artery Disease, Atrial fibrillation (on chronic anticoagulation with coumadin), Chronic diastolic CHF, 13 blood infusions and with a hx of recent discontinuation of IV iron a few weeks ago that presented with dizziness, lightheadedness upon standing, lower abdominal pain, chronically swollen legs (lymphedema), ulcer, obesity, bright red blood per rectum, nosebleeds to ED on 09/04/16 and was admitted for GI bleed/acute anemia  after an ED course showed  H/H 6.6/23.0, platelets of 133, potassium slightly low at 3.2, Cr of 1.11, positive orthostatic vital signs,  INR of 3.02, negative troponin, hemoccult positive, rectal exam with maroon colored stool.  Gastroenterology consulted by EDP.    [name] has had 4 admissions in the last 6 months  [facility] Cm received [name] on 09/05/16 as a referral from [place] hospital liaison to engage for transition of care calls and evaluate for monthly home visits  [facility] CM placed calls to [name] on 09/06/16 and spoke with her and one of her daughters, [name].  She agreed on a home visit on 09/07/16    Subjective:  "I bruise easily" "I want you to visit once a month" "My daughters take good care of me"  Objective:   Review of Systems  Constitutional: Negative for chills, diaphoresis, fever, malaise/fatigue and weight loss.  HENT: Positive for hearing loss. Negative for congestion, ear discharge, ear pain, nosebleeds, sinus pain, sore throat and tinnitus.   Eyes: Negative.  Negative for blurred vision, double vision, photophobia, pain, discharge and redness.  Respiratory: Negative.  Negative for cough, hemoptysis, sputum production, shortness of breath, wheezing and stridor.   Cardiovascular: Positive for leg swelling.  Negative for chest pain, palpitations, orthopnea, claudication and PND.  Gastrointestinal: Negative.  Negative for abdominal pain, blood in stool, constipation, diarrhea, heartburn, melena, nausea and vomiting.  Genitourinary: Negative.  Negative for dysuria, flank pain, frequency, hematuria and urgency.  Musculoskeletal: Positive for myalgias. Negative for back pain, falls, joint pain and neck pain.  Skin: Positive for itching and rash.  Neurological: Positive for weakness. Negative for dizziness, tingling, tremors, sensory change, speech change, focal weakness, seizures, loss of consciousness and headaches.  Endo/Heme/Allergies: Negative for environmental allergies and polydipsia. Bruises/bleeds easily.  Psychiatric/Behavioral: Negative for depression, hallucinations, memory loss, substance abuse and suicidal ideas. The patient is not nervous/anxious and does not have insomnia.     Physical Exam  Constitutional: She is oriented to person, place, and time. She appears well-developed and well-nourished.  HENT:  Head: Normocephalic and atraumatic.  Mouth/Throat: Oropharynx is clear and moist.  Eyes: Conjunctivae are normal. Pupils are equal, round, and reactive to light.  Neck: Normal range of motion. Neck supple.  Cardiovascular: Normal rate, regular rhythm, normal heart sounds and intact distal pulses.   Respiratory: Effort normal and breath sounds normal.  GI: Soft. Bowel sounds are normal.  Musculoskeletal: She exhibits edema.  Neurological: She is alert and oriented to person, place, and time.  Skin: Skin is warm and dry. Bruising, petechiae and rash noted. Rash is papular.     Psychiatric: She has a normal mood and affect. Her behavior is normal. Judgment and thought content normal.    Encounter Medications:   Outpatient Encounter Prescriptions as of 09/07/2016  Medication Sig Note  . acetaminophen (TYLENOL) 500 MG tablet Take 500  mg by mouth every 6 (six) hours as needed for  headache.   Marland Kitchen atorvastatin (LIPITOR) 80 MG tablet TAKE 1 TABLET EVERY NIGHT AT BEDTIME   . bisoprolol (ZEBETA) 5 MG tablet Take 1 tablet (5 mg total) by mouth 2 (two) times daily. 09/07/2016: Changing to 2.5 mg in hospital   . metolazone (ZAROXOLYN) 2.5 MG tablet Take 1 tablet (2.5 mg total) by mouth every Monday, Wednesday, and Friday.   . pantoprazole (PROTONIX) 40 MG tablet Take 1 tablet (40 mg total) by mouth 2 (two) times daily before a meal.   . potassium chloride SA (K-DUR,KLOR-CON) 20 MEQ tablet Please take 40 mg oral twice daily for 3 days, then continue with 20 mg oral twice daily (Patient taking differently: Take 20 mEq by mouth 2 (two) times daily. Please take 40 mg oral twice daily for 3 days, then continue with 20 mg oral twice daily)   . warfarin (COUMADIN) 5 MG tablet Take 1 tablet daily except 1/2 tablet on Tuesdays and Saturdays or as directed by Coumadin Clinic (Patient taking differently: Take 2.5-5 mg by mouth daily. Take 1 tablet daily except 1/2 tablet on Tuesdays and Saturdays or as directed by Coumadin Clinic)   . Pediatric Multivitamins-Iron (FLINTSTONES PLUS IRON PO) Take 1 tablet by mouth 2 (two) times daily.  09/07/2016: No longer taking iron with mvi   . torsemide (DEMADEX) 20 MG tablet Take 4 tablets (80 mg total) by mouth 2 (two) times daily. (Patient taking differently: Take 20 mg by mouth daily. Take on Monday, Wednesday, and Friday.)    No facility-administered encounter medications on file as of 09/07/2016.     Functional Status:   In your present state of health, do you have any difficulty performing the following activities: 09/07/2016 09/04/2016  Hearing? Tempie Donning  Vision? N N  Difficulty concentrating or making decisions? N N  Walking or climbing stairs? N N  Dressing or bathing? N N  Doing errands, shopping? N N  Preparing Food and eating ? N -  Using the Toilet? N -  Some recent data might be hidden    Fall/Depression Screening:     Assessment:   Peggy  Perkins and her daughter, [name] presented at her dining room table with her discharge instructions and notes from [place] her daughter present. THN CM discussed the purpose of THN CM free services, reviewed her welcome [facility] package and had her [facility] consent formed signed and a copy given to her. [name] has given written permission for [facility] staff to contact her children listed on consent form as needed.   Transition of Care (TOC) assessment completed [name] is scheduled to call for appt today to follow up with pcp and CV.  Discussed request for hospital follow up appt versus regular office visits   Acute Medical issues noted Edema of lower legs with hx of chronic swelling of legs (lymphemia) but today both legs are extremely swollen and painful to touch, no seeping noted   Left arm redness, rash from being bitten by an insect Daughter complain of "ants in the kitchen" Recommended ice to area, OTC pain medication and cortisone cream as needed for itching.   Chronic medical issues (CHF, afib, HTN,CAD) Being managed with medications.  Edema and pain of lower extremities Wt 166 5 pounds over weight reported on discharge from hospital.  CHF Zone symptoms and home management interventions reviewed with [name] and [name]. Encouraged her to call her pcp or CV/heart doctor as indicated to  discuss home treatment plan to manage edema. Discussed reasons for possible edema after discharge on 09/06/16.  Encouraged weighing daily, monitoring for weight gain, maintaining low sodium heart healthy diet monitoring fluid intake, elevation of lower extremities to reduce edema and activity level.  Provided education packages for CHF and Atrial fibrillation to review.  Social [name] stated while daughter [name] was on phone with her pharmacy that she missed her Husband who died 4 years ago (he was in the [employer]) "kept [place] all in line"     Medicines total 10 Confirmed she is to Start coumadin on Monday 09/10/16  Resolved medication discrepancy with bisoprolol     Plan:  [name] agrees to "Monthly" home visits and will be seen next month, June 2018  [facility] added her son to emergency contact list in EPIC as requested and verified all of the other daughters contact information  [facility] CM called [name] office to speak with receptionist meghan for hospital follow up appt scheduling  Paged Dr Tat at [phone]  who returned a call to CM.  Dr Tat clarified that pt BP values were increasing near discharge and there should be a decrease from 10 mg to 5 mg not 2.5 mg in her bp medicine It is correct on the discharge instructions Resolved  discrepancy in Bisoprolol of 5 to 2.5 mg change- which should remain as Bisoprolol 5 mg  Discussed weight gains for CHF zones with [name] and [name] Last hospital wt was 161 lbs since home pt has not been weighing and today and when got on scale showed 166 lbs today encouraged to follow CHF heart zones and call her pcp, watch her diet and take her diuretic   [facility] CM Care Plan Problem One     Most Recent Value  Care Plan Problem One  knowledge deficit of home care for chronic medical issues (CAD, HTN, CHF) by evidence of 4 admissions in last 6 months  Role Documenting the Problem One  Care Management Coordinator  Care Plan for Problem One  Active  THN Long Term Goal   over the next 90 days patient and family will verbalize and demonstrate interventions for home care of chronic medical issues that decreases hospital admissions and ED visits   [facility] Long Term Goal Start Date  09/07/16  Interventions for Problem One Long Term Goal  Assess knowledge of chronic medical issues, educate on home care for chronic medical issues, discuss zones for chf, assist with obtaining DME needed to monitor chronic medical issues  THN CM Short Term Goal #1   over the next 45 days patient and family will verbalize understanding of home care for chronic medical issues  THN CM Short Term Goal #1  Start Date  09/07/16  Interventions for Short Term Goal #1  Assess knowledge of chronic medical issues, educate on home care for chronic medical issues, discuss zones for chf, assist with obtaining DME needed to monitor chronic medical issues    [facility] CM Care Plan Problem Two     Most Recent Value  Care Plan Problem Two  medication discrepancies and compliance with taking medications as ordered   Role Documenting the Problem Two  Care Management [place] for Problem Two  Active  Interventions for Problem Two Long Term Goal   assess medication issues, educate patient/family as Cm assist with clarifying medications, review medications, consult with MDs and pharmacy as needed, evaluate intake of medications  THN Long Term Goal  over the next 45 days patient and family medication descrepancies will be resolved and patient will verbalize increased compliance with medication intake as ordered by MDs   [place] Term Goal Start Date  09/07/16  [facility] CM Short Term Goal #1   over the next 14 days medication discrepanies will be resolved and patient will verbalize importance of taking each medication as ordered  [facility] CM Short Term Goal #1 Start Date  09/07/16  Interventions for Short Term Goal #2   assess medication issues, educate patient/family as Cm assist with clarifying medications, review medications, consult with MDs and pharmacy as needed, evaluate intake of medications       Kaysea Raya L. Lavina Hamman, RN, BSN, [place] Care Management [phone]

## 2016-09-09 ENCOUNTER — Emergency Department (HOSPITAL_COMMUNITY)
Admission: EM | Admit: 2016-09-09 | Discharge: 2016-09-09 | Disposition: A | Discharge status: Home / Self Care | Payer: Medicare Other | Attending: Dermatology | Admitting: Dermatology

## 2016-09-09 ENCOUNTER — Encounter (HOSPITAL_COMMUNITY): Payer: Self-pay | Admitting: Emergency Medicine

## 2016-09-09 DIAGNOSIS — S81802A Unspecified open wound, left lower leg, initial encounter: Secondary | ICD-10-CM | POA: Diagnosis not present

## 2016-09-09 DIAGNOSIS — Y929 Unspecified place or not applicable: Secondary | ICD-10-CM | POA: Insufficient documentation

## 2016-09-09 DIAGNOSIS — X58XXXA Exposure to other specified factors, initial encounter: Secondary | ICD-10-CM | POA: Insufficient documentation

## 2016-09-09 DIAGNOSIS — Y999 Unspecified external cause status: Secondary | ICD-10-CM | POA: Diagnosis not present

## 2016-09-09 DIAGNOSIS — I5032 Chronic diastolic (congestive) heart failure: Secondary | ICD-10-CM | POA: Insufficient documentation

## 2016-09-09 DIAGNOSIS — I110 Hypertensive heart disease with heart failure: Secondary | ICD-10-CM | POA: Diagnosis not present

## 2016-09-09 DIAGNOSIS — Z87891 Personal history of nicotine dependence: Secondary | ICD-10-CM | POA: Insufficient documentation

## 2016-09-09 DIAGNOSIS — I2510 Atherosclerotic heart disease of native coronary artery without angina pectoris: Secondary | ICD-10-CM | POA: Insufficient documentation

## 2016-09-09 DIAGNOSIS — Y939 Activity, unspecified: Secondary | ICD-10-CM | POA: Insufficient documentation

## 2016-09-09 NOTE — ED Notes (Signed)
Pt told registration staff that her leg had stopped oozing and she was going home

## 2016-09-09 NOTE — ED Triage Notes (Signed)
Weeping fluid on left lower leg

## 2016-09-11 ENCOUNTER — Encounter (HOSPITAL_COMMUNITY): Payer: Self-pay | Admitting: Internal Medicine

## 2016-09-18 ENCOUNTER — Ambulatory Visit: Payer: Self-pay | Admitting: Cardiology

## 2016-09-19 ENCOUNTER — Ambulatory Visit (INDEPENDENT_AMBULATORY_CARE_PROVIDER_SITE_OTHER): Payer: Medicare Other | Admitting: *Deleted

## 2016-09-19 DIAGNOSIS — I482 Chronic atrial fibrillation, unspecified: Secondary | ICD-10-CM

## 2016-09-19 DIAGNOSIS — I2510 Atherosclerotic heart disease of native coronary artery without angina pectoris: Secondary | ICD-10-CM | POA: Diagnosis not present

## 2016-09-19 DIAGNOSIS — Z5181 Encounter for therapeutic drug level monitoring: Secondary | ICD-10-CM

## 2016-09-19 DIAGNOSIS — I4891 Unspecified atrial fibrillation: Secondary | ICD-10-CM | POA: Diagnosis not present

## 2016-09-19 LAB — POCT INR: INR: 1.2

## 2016-09-26 ENCOUNTER — Ambulatory Visit (INDEPENDENT_AMBULATORY_CARE_PROVIDER_SITE_OTHER): Payer: Medicare Other | Admitting: *Deleted

## 2016-09-26 DIAGNOSIS — I4891 Unspecified atrial fibrillation: Secondary | ICD-10-CM

## 2016-09-26 DIAGNOSIS — I2510 Atherosclerotic heart disease of native coronary artery without angina pectoris: Secondary | ICD-10-CM | POA: Diagnosis not present

## 2016-09-26 DIAGNOSIS — E663 Overweight: Secondary | ICD-10-CM | POA: Diagnosis not present

## 2016-09-26 DIAGNOSIS — Z5181 Encounter for therapeutic drug level monitoring: Secondary | ICD-10-CM

## 2016-09-26 DIAGNOSIS — I739 Peripheral vascular disease, unspecified: Secondary | ICD-10-CM | POA: Diagnosis not present

## 2016-09-26 DIAGNOSIS — I482 Chronic atrial fibrillation, unspecified: Secondary | ICD-10-CM

## 2016-09-26 DIAGNOSIS — Z1389 Encounter for screening for other disorder: Secondary | ICD-10-CM | POA: Diagnosis not present

## 2016-09-26 DIAGNOSIS — K922 Gastrointestinal hemorrhage, unspecified: Secondary | ICD-10-CM | POA: Diagnosis not present

## 2016-09-26 DIAGNOSIS — I8390 Asymptomatic varicose veins of unspecified lower extremity: Secondary | ICD-10-CM | POA: Diagnosis not present

## 2016-09-26 DIAGNOSIS — Z6829 Body mass index (BMI) 29.0-29.9, adult: Secondary | ICD-10-CM | POA: Diagnosis not present

## 2016-09-26 LAB — POCT INR: INR: 1.4

## 2016-09-27 DIAGNOSIS — H25813 Combined forms of age-related cataract, bilateral: Secondary | ICD-10-CM | POA: Diagnosis not present

## 2016-10-02 ENCOUNTER — Other Ambulatory Visit: Payer: Self-pay | Admitting: *Deleted

## 2016-10-02 NOTE — Patient Outreach (Signed)
[facility]) Care Management   10/02/2016  [name] [birth year] [id number]  [name] is an 83 y.o. female with a PMH of PVD, HTN, hyperlipidemia, CKD/ACRF stage 3, AV malformation of GI tract, Carotid Artery Disease, Atrial fibrillation (on chronic anticoagulation with coumadin), Chronic diastolic CHF, 13 blood infusions and with a hx of recent discontinuation of IV iron a few weeks ago that presented with dizziness, lightheadedness upon standing, lower abdominal pain, chronically swollen legs (lymphedema), ulcer, obesity, bright red blood per rectum, nosebleeds to ED on 09/04/16 and was admitted for GI bleed/acute anemia  after an ED course showed H/H 6.6/23.0, platelets of 133, potassium slightly low at 3.2, Cr of 1.11, positive orthostatic vital signs, INR of 3.02, negative troponin, hemoccult positive, rectal exam with maroon colored stool. Gastroenterology consulted by EDP.    [name] has had 4 admissions in the last 6 months  [facility] Cm received [name] on 09/05/16 as a referral from [place], [place] hospital liaison to engage for transition of care calls and evaluate for monthly home visits  [facility] CM is visiting with [name] for the second time  Subjective: "I'm doing much better and you won't have to keep visiting me"  "I like french fries"  "but with no salt I tell them"  Objective:   BP 136/70   Pulse 96   Temp 99 F (37.2 C) (Oral)   Resp 20   Ht 1.524 m (5')   Wt 159 lb (72.1 kg)   SpO2 97%   BMI 31.05 kg/m   Review of Systems  Constitutional: Negative for chills.  HENT: Negative for congestion, ear discharge, ear pain, hearing loss, nosebleeds, sinus pain, sore throat and tinnitus.   Eyes: Positive for blurred vision. Negative for double vision, photophobia, pain, discharge and redness.       Blurred vision issues related to cataract  Respiratory: Negative for cough, hemoptysis, sputum production, shortness of breath,  wheezing and stridor.   Gastrointestinal: Negative for abdominal pain, blood in stool, constipation, diarrhea, heartburn, melena, nausea and vomiting.  Genitourinary: Negative for dysuria, flank pain, frequency, hematuria and urgency.  Musculoskeletal: Negative for back pain, falls, joint pain, myalgias and neck pain.  Neurological: Negative for dizziness, tingling, tremors, sensory change, speech change, focal weakness, seizures, loss of consciousness and headaches.  Endo/Heme/Allergies: Negative for environmental allergies and polydipsia. Bruises/bleeds easily.  Psychiatric/Behavioral: Negative for depression, hallucinations, memory loss, substance abuse and suicidal ideas. The patient is not nervous/anxious and does not have insomnia.        [place]rys about loss of husband     Physical Exam  Constitutional: She is oriented to person, place, and time. She appears well-developed and well-nourished.  HENT:  Head: Normocephalic and atraumatic.  Eyes: Pupils are equal, round, and reactive to light. Conjunctivae are normal.  Neck: Normal range of motion. Neck supple.  Cardiovascular: Normal rate, normal heart sounds and intact distal pulses.   Respiratory: Effort normal and breath sounds normal.  GI: Soft. Bowel sounds are normal.  Musculoskeletal: She exhibits edema.  Neurological: She is alert and oriented to person, place, and time.  Skin: Skin is warm and dry.  Psychiatric: She has a normal mood and affect. Her behavior is normal. Judgment and thought content normal.    Encounter Medications:   Outpatient Encounter Prescriptions as of 10/02/2016  Medication Sig Note  . acetaminophen (TYLENOL) 500 MG tablet Take 500 mg by mouth every 6 (six) hours as needed for headache.   Marland Kitchen  atorvastatin (LIPITOR) 80 MG tablet TAKE 1 TABLET EVERY NIGHT AT BEDTIME   . bisoprolol (ZEBETA) 5 MG tablet Take 1 tablet (5 mg total) by mouth 2 (two) times daily. 10/02/2016: Pt is now taking 5 mg bisprolol   .  pantoprazole (PROTONIX) 40 MG tablet Take 1 tablet (40 mg total) by mouth 2 (two) times daily before a meal.   . Pediatric Multivitamins-Iron (FLINTSTONES PLUS IRON PO) Take 1 tablet by mouth 2 (two) times daily.  09/07/2016: No longer taking iron with mvi   . potassium chloride SA (K-DUR,KLOR-CON) 20 MEQ tablet Please take 40 mg oral twice daily for 3 days, then continue with 20 mg oral twice daily (Patient taking differently: Take 20 mEq by mouth 2 (two) times daily. Please take 40 mg oral twice daily for 3 days, then continue with 20 mg oral twice daily)   . warfarin (COUMADIN) 5 MG tablet Take 1 tablet daily except 1/2 tablet on Tuesdays and Saturdays or as directed by Coumadin Clinic (Patient taking differently: Take 2.5-5 mg by mouth daily. Take 1 tablet daily except 1/2 tablet on Tuesdays and Saturdays or as directed by Coumadin Clinic)   . metolazone (ZAROXOLYN) 2.5 MG tablet Take 1 tablet (2.5 mg total) by mouth every Monday, Wednesday, and Friday.   . torsemide (DEMADEX) 20 MG tablet Take 4 tablets (80 mg total) by mouth 2 (two) times daily. (Patient taking differently: Take 20 mg by mouth daily. Take on Monday, Wednesday, and Friday.)    No facility-administered encounter medications on file as of 10/02/2016.     Functional Status:   In your present state of health, do you have any difficulty performing the following activities: 09/07/2016 09/04/2016  Hearing? Tempie Donning  Vision? N N  Difficulty concentrating or making decisions? N N  Walking or climbing stairs? N N  Dressing or bathing? N N  Doing errands, shopping? N N  Preparing Food and eating ? N -  Using the Toilet? N -  Some recent data might be hidden     Assessment:   [name] reports legs weeping at church when she was with her daughter, Romie Minus.  She went to the ED but left without treatment when she reports her legs stopped weeping.  She then followed up she states with [name] and reports her legs have not showed signs of  weeping  During this home visit no weeping of legs noted.  Legs with bilateral lymphedema. Her legs are large and she reports painful to touch during [facility] CM examination She has a band- aid on the left leg covering an old abrasion  Peggy Nutter reports weight loss of 30 lbs and reports weight today as 159 lb  Peggy Madariaga reports presently taking torsemide 80 mg bid on Mondays, Wednesdays and Fridays. Peggy Sweeten reports 2 weeks ago [name] took her off Metolazone  THN CM discussed BMI calculator and the range of 127-147 lbs for a more beneficial BMI for her Peggy Brach states she would prefer to remain less than 165 lbs She also informs THN CM that her scale is 3 lbs off  Peggy Zemaitis reports monitoring and documentation of her vitals signs - She reports her bp =141/74 and a heart rate of 81 this morning During the home visit she received a call at 4:56 pm from Dr Laural Golden office and informed staff "I'm good"    Frutiger informed [facility] CM she missed her husband and was married 104 years She reports her husband  died 3 years ago and became tearful   Her Daughters are a good support system and she was contacted during the home visit by two of her daughters    [facility] CM reviewed lymphedema, signs and symptoms of CHF and CHF levels.  THN CM strongly encouraged Peggy Victor to call her Cardiologist [name] if she experienced any signs of weeping or CHF symptoms She voiced to [facility] CM that she would contact [name]   Plan: To follow up with Peggy Augspurger in 4-6 weeks to check on her for further needs and plan for closure if no further needs  [facility] CM Care Plan Problem One     Most Recent Value  Care Plan Problem One  (P) knowledge deficit of home care for chronic medical issues (CAD, HTN, CHF) by evidence of 4 admissions in last 6 months  Role Documenting the Problem One  (P) Care Management Coordinator  Care Plan for Problem One  (P) Active  THN Long Term Goal   (P) over the next 90 days patient  and family will verbalize and demonstrate interventions for home care of chronic medical issues that decreases hospital admissions and ED visits   THN Long Term Goal Start Date  (P) 09/07/16  Interventions for Problem One Long Term Goal  (P) Assess knowledge of chronic medical issues, educate on home care for chronic medical issues, discuss zones for chf, assist with obtaining DME needed to monitor chronic medical issues  THN CM Short Term Goal #1   (P) over the next 45 days patient and family will verbalize understanding of home care for chronic medical issues  THN CM Short Term Goal #1 Start Date  (P) 09/07/16  Interventions for Short Term Goal #1  (P) Assess knowledge of chronic medical issues, educate on home care for chronic medical issues, discuss zones for chf, assist with obtaining DME needed to monitor chronic medical issues    [facility] CM Care Plan Problem Two     Most Recent Value  Care Plan Problem Two  (P) medication discrepancies and compliance with taking medications as ordered   Role Documenting the Problem Two  (P) Care Management [place] for Problem Two  (P) Active  Interventions for Problem Two Long Term Goal   (P) assess medication issues, educate patient/family as Cm assist with clarifying medications, review medications, consult with MDs and pharmacy as needed, evaluate intake of medications  THN Long Term Goal  (P) over the next 45 days patient and family medication descrepancies will be resolved and patient will verbalize increased compliance with medication intake as ordered by MDs   [facility] Long Term Goal Start Date  (P) 09/07/16  THN CM Short Term Goal #1   (P) over the next 14 days medication discrepanies will be resolved and patient will verbalize importance of taking each medication as ordered  THN CM Short Term Goal #1 Start Date  (P) 09/07/16  Interventions for Short Term Goal #2   (P) assess medication issues, educate patient/family as Cm assist with clarifying  medications, review medications, consult with MDs and pharmacy as needed, evaluate intake of medications       Jaeliana Lococo L. Lavina Hamman, RN, BSN, [place] Care Management [phone]

## 2016-10-04 ENCOUNTER — Other Ambulatory Visit: Payer: Self-pay | Admitting: Cardiology

## 2016-10-08 ENCOUNTER — Ambulatory Visit (INDEPENDENT_AMBULATORY_CARE_PROVIDER_SITE_OTHER): Payer: Medicare Other | Admitting: *Deleted

## 2016-10-08 DIAGNOSIS — I482 Chronic atrial fibrillation, unspecified: Secondary | ICD-10-CM

## 2016-10-08 DIAGNOSIS — Z5181 Encounter for therapeutic drug level monitoring: Secondary | ICD-10-CM

## 2016-10-08 DIAGNOSIS — I2510 Atherosclerotic heart disease of native coronary artery without angina pectoris: Secondary | ICD-10-CM

## 2016-10-08 DIAGNOSIS — I4891 Unspecified atrial fibrillation: Secondary | ICD-10-CM

## 2016-10-08 LAB — POCT INR: INR: 1.6

## 2016-10-12 ENCOUNTER — Telehealth (INDEPENDENT_AMBULATORY_CARE_PROVIDER_SITE_OTHER): Payer: Self-pay | Admitting: Internal Medicine

## 2016-10-12 DIAGNOSIS — K219 Gastro-esophageal reflux disease without esophagitis: Secondary | ICD-10-CM

## 2016-10-12 MED ORDER — PANTOPRAZOLE SODIUM 40 MG PO TBEC: 40.0000 mg | DELAYED_RELEASE_TABLET | Freq: Two times a day (BID) | ORAL | 0 refills | Status: AC

## 2016-10-12 NOTE — Telephone Encounter (Signed)
err

## 2016-10-16 DIAGNOSIS — Z6831 Body mass index (BMI) 31.0-31.9, adult: Secondary | ICD-10-CM | POA: Diagnosis not present

## 2016-10-16 DIAGNOSIS — I2510 Atherosclerotic heart disease of native coronary artery without angina pectoris: Secondary | ICD-10-CM | POA: Diagnosis not present

## 2016-10-16 DIAGNOSIS — R600 Localized edema: Secondary | ICD-10-CM | POA: Diagnosis not present

## 2016-10-16 DIAGNOSIS — I8390 Asymptomatic varicose veins of unspecified lower extremity: Secondary | ICD-10-CM | POA: Diagnosis not present

## 2016-10-16 DIAGNOSIS — I739 Peripheral vascular disease, unspecified: Secondary | ICD-10-CM | POA: Diagnosis not present

## 2016-10-23 NOTE — Progress Notes (Signed)
Cardiology Office Note  Date: 10/24/2016   ID: SATRINA MAGALLANES, DOB [birth year], MRN [id number]  PCP: [name], MD  Primary Cardiologist: Rozann Lesches, MD   Chief Complaint  Patient presents with  . Lymphedema  . Atrial Fibrillation    History of Present Illness: [name] is an 83 y.o. female last seen in March by Ms. Dunn PA-C. At that time she was doing quite well in terms of lymphedema control, was on Demadex 80 mg twice daily. Unfortunately, since then she has had further decline with about a 10 pound weight gain over the last several weeks and markedly increased lymphedema. She was hospitalized back in May, at that time her diuretics were adjusted, she was taken off Demadex and placed on metolazone, eventually went back to [facility] but had already started gaining fluid weight. She saw Dr. Hilma Favors and was switched to Lasix.  Today we discussed going back to Demadex at 80 mg twice daily, also providing metolazone 5 mg 3 times a week for the next week, then follow-up BMET and report back to [place] regarding leg swelling. We will also refer her to the lymphedema clinic for mechanical compression.  She continues to follow in the anticoagulation clinic on Coumadin. No reported bleeding problems.  Past Medical History:  Diagnosis Date  . Angiodysplasia of stomach and duodenum   . AV malformation of gastrointestinal tract   . Carotid artery disease ([place])    RIght CEA 05/2004  . Chronic anticoagulation   . Chronic atrial fibrillation ([place])   . Chronic blood loss anemia   . Chronic diastolic CHF (congestive heart failure) ([place])   . Coronary atherosclerosis of native coronary artery    a. RCA stent 2003 with 60% LCx, 60% OM1, 80% OM2 disease at that time.  . Essential hypertension, benign   . Gastric ulcer   . GI bleeding    Recurrent GIB (prior gastric ulcer/diverticulosis/AVM in 2017, gastric AVM s/p endoscopic therapy in 05/2016, recurrent GIB 06/2016 with AVM s/p  therapy on endoscopy.  . Hyperlipidemia   . Lymphedema    a. in general, her LE edema appears to be a poor indicator of her total fluid status - chronic edema.  . Obesity   . Renal artery stenosis (HCC)    Bilateral renal artery stents 2004  . Tobacco abuse, in remission    40 pack year stopped in 1989    Past Surgical History:  Procedure Laterality Date  . APPENDECTOMY    . CAROTID ENDARTERECTOMY     Right in 2005, left in 2010  . COLONOSCOPY  Never   Declines  . COLONOSCOPY N/A 08/19/2015   Procedure: COLONOSCOPY;  Surgeon: Rogene Houston, MD;  [facility];  Service: Endoscopy;  Laterality: N/A;  7:30  . CORONARY ANGIOPLASTY    . ESOPHAGOGASTRODUODENOSCOPY N/A 08/19/2015   Procedure: ESOPHAGOGASTRODUODENOSCOPY (EGD);  Surgeon: Rogene Houston, MD;  [facility];  Service: Endoscopy;  Laterality: N/A;  . ESOPHAGOGASTRODUODENOSCOPY N/A 05/24/2016   Procedure: ESOPHAGOGASTRODUODENOSCOPY (EGD);  Surgeon: Rogene Houston, MD;  [facility];  Service: Endoscopy;  Laterality: N/A;  . ESOPHAGOGASTRODUODENOSCOPY N/A 06/19/2016   Procedure: ESOPHAGOGASTRODUODENOSCOPY (EGD);  Surgeon: Rogene Houston, MD;  [facility];  Service: Endoscopy;  Laterality: N/A;  . ESOPHAGOGASTRODUODENOSCOPY N/A 09/05/2016   Procedure: ESOPHAGOGASTRODUODENOSCOPY (EGD);  Surgeon: Rogene Houston, MD;  [facility];  Service: Endoscopy;  Laterality: N/A;  . GIVENS CAPSULE STUDY N/A 06/20/2016  Procedure: GIVENS CAPSULE STUDY;  Surgeon: Rogene Houston, MD;  [facility];  Service: Endoscopy;  Laterality: N/A;    Current Outpatient Prescriptions  Medication Sig Dispense Refill  . acetaminophen (TYLENOL) 500 MG tablet Take 500 mg by mouth every 6 (six) hours as needed for headache.    Marland Kitchen atorvastatin (LIPITOR) 80 MG tablet TAKE 1 TABLET EVERY NIGHT AT BEDTIME 60 tablet 6  . bisoprolol (ZEBETA) 5 MG tablet take 1 tablet by mouth twice a day 60 tablet 0  .  pantoprazole (PROTONIX) 40 MG tablet Take 1 tablet (40 mg total) by mouth 2 (two) times daily before a meal. 60 tablet 0  . Pediatric Multivitamins-Iron (FLINTSTONES PLUS IRON PO) Take 1 tablet by mouth 2 (two) times daily.     . potassium chloride SA (K-DUR,KLOR-CON) 20 MEQ tablet Please take 40 mg oral twice daily for 3 days, then continue with 20 mg oral twice daily (Patient taking differently: Take 20 mEq by mouth 2 (two) times daily. Please take 40 mg oral twice daily for 3 days, then continue with 20 mg oral twice daily) 180 tablet 0  . warfarin (COUMADIN) 5 MG tablet Take 1 tablet daily except 1/2 tablet on Tuesdays and Saturdays or as directed by Coumadin Clinic (Patient taking differently: Take 2.5-5 mg by mouth daily. Take 1 tablet daily except 1/2 tablet on Tuesdays and Saturdays or as directed by Coumadin Clinic) 40 tablet 4   No current facility-administered medications for this visit.    Allergies:  Diltiazem and Verapamil   Social History: The patient  reports that she quit smoking about 29 years ago. Her smoking use included Cigarettes. She has never used smokeless tobacco. She reports that she does not drink alcohol or use drugs.   ROS:  Please see the history of present illness. Otherwise, complete review of systems is positive for intermittent palpitations.  All other systems are reviewed and negative.   Physical Exam: VS:  BP 122/62   Pulse 99   Ht 5' (1.524 m)   Wt 172 lb (78 kg)   SpO2 94%   BMI 33.59 kg/m , BMI Body mass index is 33.59 kg/m.  Wt Readings from Last 3 Encounters:  10/24/16 172 lb (78 kg)  10/02/16 159 lb (72.1 kg)  09/09/16 161 lb (73 kg)    Pleasant elderly woman, no distress.  HEENT: Conjunctiva and lids normal, oropharynx clear.  Neck: Supple, no elevated JVP or carotid bruits, no thyromegaly.  Lungs: Clear to auscultation, nonlabored breathing at rest.  Cardiac: Irregularly irregular, no S3 or significant systolic murmur, no pericardial  rub.  Abdomen: Soft, nontender,bowel sounds present.  Extremities: Significant bilaterallymphedema, distal pulses 1+.  Skin: Warm and dry.  Musculoskeletal: No kyphosis.  Neuropsychiatric: Alert and oriented x3, affect grossly appropriate.  ECG: I personally reviewed the tracing from 09/04/2016 which showed rate-controlled atrial fibrillation with PVC versus aberrantly conducted complex and diffuse nonspecific ST changes.  Recent Labwork: 05/09/2016: TSH 1.76 08/25/2016: B Natriuretic Peptide 438.0 09/04/2016: ALT 17; AST 33 09/06/2016: BUN 25; Creatinine, Ser 0.90; Hemoglobin 8.3; Magnesium 1.9; Platelets 148; Potassium 3.5; Sodium 140     Component Value Date/Time   CHOL 91 (L) 12/22/2014 1035   TRIG 79 12/22/2014 1035   HDL 34 (L) 12/22/2014 1035   CHOLHDL 2.7 12/22/2014 1035   VLDL 16 12/22/2014 1035   LDLCALC 41 12/22/2014 1035    Other Studies Reviewed Today:  Echocardiogram 08/26/2016: Study Conclusions  - Left ventricle: The cavity  size was normal. Wall thickness was   increased in a pattern of mild LVH. Systolic function was normal.   The estimated ejection fraction was 60%. Wall motion was normal;   there were no regional wall motion abnormalities. The study is   not technically sufficient to allow evaluation of LV diastolic   function. - Aortic valve: Mildly to moderately calcified annulus. Trileaflet;   mildly calcified leaflets. There was no stenosis. - Mitral valve: Mildly thickened leaflets . There was mild   regurgitation. - Left atrium: The atrium was moderately dilated. - Right ventricle: The cavity size was mildly dilated. Systolic   function was mildly reduced. TAPSE: 14.3 mm . - Right atrium: The atrium was severely dilated. - Atrial septum: No defect or patent foramen ovale was identified. - Tricuspid valve: There was moderate-severe regurgitation. - Pulmonary arteries: PA peak pressure: 41 mm Hg (S). - Inferior vena cava: The vessel was dilated.  The respirophasic   diameter changes were blunted (< 50%), consistent with elevated   central venous pressure. Estimated CVP 15 mmHg.  Assessment and Plan:  1. Lymphedema, progressive bilateral leg swelling associated with weight gain. She was hospitalized back in May with acute blood loss anemia, has undergone diuretic adjustments since then. Plan at this time is to go back from Lasix to Demadex at 80 mg twice daily, provide metolazone 5 mg to be taken 3 times a week at least over the next 7 days. We are referring her to the lymphedema clinic for mechanical compression. She will report back to [place] in about a week to see if we need to change diuretics further. Follow-up BMET in 7 days.  2. Chronic atrial fibrillation. She continues on Coumadin for stroke prophylaxis. She remains on bisoprolol for heart rate control as well.  3. CAD status post RCA stenting in 2003 with moderate circumflex and obtuse marginal disease is being managed medically. No active angina symptoms.  4. History of blood loss anemia with gastric AVMs. She follows with Dr. Laural Golden.  Current medicines were reviewed with the patient today.   Orders Placed This Encounter  Procedures  . Ambulatory referral to Wound Clinic    Disposition: Follow-up in a few weeks for clinical reassessment.  Signed, Peggy Sark, MD, [facility] 10/24/2016 11:23 AM    [place] [facility] [address], [place], [place] [address] Phone: [phone]; Fax: [phone]

## 2016-10-24 ENCOUNTER — Encounter: Payer: Self-pay | Admitting: Cardiology

## 2016-10-24 ENCOUNTER — Ambulatory Visit (INDEPENDENT_AMBULATORY_CARE_PROVIDER_SITE_OTHER): Payer: Medicare Other | Admitting: Cardiology

## 2016-10-24 ENCOUNTER — Ambulatory Visit (INDEPENDENT_AMBULATORY_CARE_PROVIDER_SITE_OTHER): Payer: Medicare Other | Admitting: *Deleted

## 2016-10-24 VITALS — BP 122/62 | HR 99 | Ht 60.0 in | Wt 172.0 lb

## 2016-10-24 DIAGNOSIS — I890 Lymphedema, not elsewhere classified: Secondary | ICD-10-CM | POA: Diagnosis not present

## 2016-10-24 DIAGNOSIS — I2510 Atherosclerotic heart disease of native coronary artery without angina pectoris: Secondary | ICD-10-CM

## 2016-10-24 DIAGNOSIS — I482 Chronic atrial fibrillation, unspecified: Secondary | ICD-10-CM

## 2016-10-24 DIAGNOSIS — I4891 Unspecified atrial fibrillation: Secondary | ICD-10-CM

## 2016-10-24 DIAGNOSIS — D500 Iron deficiency anemia secondary to blood loss (chronic): Secondary | ICD-10-CM

## 2016-10-24 DIAGNOSIS — Z5181 Encounter for therapeutic drug level monitoring: Secondary | ICD-10-CM

## 2016-10-24 LAB — POCT INR: INR: 3

## 2016-10-24 MED ORDER — TORSEMIDE 20 MG PO TABS: 80.0000 mg | ORAL_TABLET | Freq: Two times a day (BID) | ORAL | 3 refills | Status: AC

## 2016-10-24 MED ORDER — METOLAZONE 5 MG PO TABS: 5.0000 mg | ORAL_TABLET | ORAL | 3 refills | Status: DC

## 2016-10-24 NOTE — Patient Instructions (Signed)
Medication Instructions:  Stop lasix Start torsemide 80 mg TWO TIMES DAILY  START METOLAZONE 5 MG - TAKE 1 TABLET ON Monday,WEDNESDAY & Friday  For 1 week.  ( CALL TO LET DR, MCDOWELL KNOW HOW SYMPTOMS ARE)  Labwork:1 WEEK  BMET -   Testing/Procedures: NONE  Follow-Up: Your physician recommends that you schedule a follow-up appointment in: 3 WEEKS    Any Other Special Instructions Will Be Listed Below (If Applicable). I HAVE PUT IN A REFERRAL TO THE WOUND CLINIC IN [place], IF THEY HAVE NOR CALLED YOU IN THE NEXT WEEK PLEASE CALL TO LET ME KNOW.     If you need a refill on your cardiac medications before your next appointment, please call your pharmacy.

## 2016-10-30 ENCOUNTER — Telehealth: Payer: Self-pay | Admitting: Cardiology

## 2016-10-30 NOTE — Telephone Encounter (Signed)
Patient has not heard form the wound clinic yet although I am told it ca take up to 2 weeks for apt  I will call her next week and see if she has apt

## 2016-10-30 NOTE — Telephone Encounter (Signed)
Pt wanted to call and let Dr. [name] know she's doing well and her weight is down to 154lbs yesterday 10/29/16 and this morning 10/30/16 she is at 152lbs--she would like someone to call her at [phone]

## 2016-10-31 DIAGNOSIS — I890 Lymphedema, not elsewhere classified: Secondary | ICD-10-CM | POA: Diagnosis not present

## 2016-10-31 LAB — BASIC METABOLIC PANEL
BUN: 33 mg/dL — ABNORMAL HIGH (ref 7–25)
CHLORIDE: 87 mmol/L — AB (ref 98–110)
CO2: 41 mmol/L — AB (ref 20–31)
CREATININE: 1.08 mg/dL — AB (ref 0.60–0.88)
Calcium: 9.5 mg/dL (ref 8.6–10.4)
Glucose, Bld: 98 mg/dL (ref 65–99)
POTASSIUM: 3.1 mmol/L — AB (ref 3.5–5.3)
Sodium: 139 mmol/L (ref 135–146)

## 2016-11-01 ENCOUNTER — Telehealth: Payer: Self-pay

## 2016-11-01 ENCOUNTER — Other Ambulatory Visit: Payer: Self-pay | Admitting: Cardiology

## 2016-11-01 MED ORDER — POTASSIUM CHLORIDE CRYS ER 20 MEQ PO TBCR: EXTENDED_RELEASE_TABLET | ORAL | 3 refills | Status: AC

## 2016-11-01 NOTE — Telephone Encounter (Signed)
I spoke with both patient and daughter Vaughan Basta and they understand change in potassiun dosing

## 2016-11-01 NOTE — Telephone Encounter (Signed)
-----   Message from Satira Sark, MD sent at 10/31/2016  4:42 PM EDT ----- Results reviewed. Increase KCL to 40 mEq in AM and 20 mEq in PM. A copy of this test should be forwarded to [name], MD.

## 2016-11-02 ENCOUNTER — Telehealth: Payer: Self-pay | Admitting: Cardiology

## 2016-11-02 NOTE — Telephone Encounter (Signed)
Patient's daughter states that patient was supposed to have referral for "doctor in [place] about her legs". Could not specify anything else. No referrals in or nothing in [place]./ tg

## 2016-11-02 NOTE — Telephone Encounter (Signed)
Spoke with pt, let her know that she will not have to go to the Wound Clinic in [place] as they have one here @ [facility]. They had scheduled her an appointment, but she is not able to make that date. I will call to let her know she will need to call [phone] to reschedule.

## 2016-11-06 ENCOUNTER — Ambulatory Visit (HOSPITAL_COMMUNITY): Payer: Medicare Other | Attending: Cardiology | Admitting: Physical Therapy

## 2016-11-07 ENCOUNTER — Ambulatory Visit (INDEPENDENT_AMBULATORY_CARE_PROVIDER_SITE_OTHER): Payer: Medicare Other | Admitting: *Deleted

## 2016-11-07 DIAGNOSIS — I2510 Atherosclerotic heart disease of native coronary artery without angina pectoris: Secondary | ICD-10-CM

## 2016-11-07 DIAGNOSIS — I4891 Unspecified atrial fibrillation: Secondary | ICD-10-CM | POA: Diagnosis not present

## 2016-11-07 DIAGNOSIS — Z5181 Encounter for therapeutic drug level monitoring: Secondary | ICD-10-CM

## 2016-11-07 DIAGNOSIS — I482 Chronic atrial fibrillation, unspecified: Secondary | ICD-10-CM

## 2016-11-07 LAB — POCT INR: INR: 2.7

## 2016-11-08 DIAGNOSIS — H25813 Combined forms of age-related cataract, bilateral: Secondary | ICD-10-CM | POA: Diagnosis not present

## 2016-11-09 ENCOUNTER — Telehealth (HOSPITAL_COMMUNITY): Payer: Self-pay | Admitting: Physical Therapy

## 2016-11-09 NOTE — Telephone Encounter (Signed)
Daughter is concern that mom will not comply to schedule. Daughter Vaughan Basta came by to talk with Jenny Reichmann about home equipment. Family will take and decide if they can do this or not. NF 11/09/16

## 2016-11-14 ENCOUNTER — Other Ambulatory Visit: Payer: Self-pay | Admitting: Physician Assistant

## 2016-11-14 ENCOUNTER — Encounter: Payer: Self-pay | Admitting: Physician Assistant

## 2016-11-14 ENCOUNTER — Ambulatory Visit (INDEPENDENT_AMBULATORY_CARE_PROVIDER_SITE_OTHER): Payer: Medicare Other | Admitting: Physician Assistant

## 2016-11-14 VITALS — BP 130/58 | HR 87 | Ht 61.0 in | Wt 154.8 lb

## 2016-11-14 DIAGNOSIS — I10 Essential (primary) hypertension: Secondary | ICD-10-CM

## 2016-11-14 DIAGNOSIS — I482 Chronic atrial fibrillation, unspecified: Secondary | ICD-10-CM

## 2016-11-14 DIAGNOSIS — Z1389 Encounter for screening for other disorder: Secondary | ICD-10-CM | POA: Diagnosis not present

## 2016-11-14 DIAGNOSIS — I890 Lymphedema, not elsewhere classified: Secondary | ICD-10-CM

## 2016-11-14 DIAGNOSIS — I2510 Atherosclerotic heart disease of native coronary artery without angina pectoris: Secondary | ICD-10-CM

## 2016-11-14 DIAGNOSIS — I83893 Varicose veins of bilateral lower extremities with other complications: Secondary | ICD-10-CM | POA: Diagnosis not present

## 2016-11-14 DIAGNOSIS — N183 Chronic kidney disease, stage 3 unspecified: Secondary | ICD-10-CM

## 2016-11-14 DIAGNOSIS — N289 Disorder of kidney and ureter, unspecified: Secondary | ICD-10-CM | POA: Diagnosis not present

## 2016-11-14 DIAGNOSIS — I5032 Chronic diastolic (congestive) heart failure: Secondary | ICD-10-CM

## 2016-11-14 MED ORDER — METOLAZONE 5 MG PO TABS
5.0000 mg | ORAL_TABLET | ORAL | 6 refills | Status: DC
Start: 2016-11-15 — End: 2016-12-11

## 2016-11-14 NOTE — Patient Instructions (Signed)
Medication Instructions:  Your physician has recommended you make the following change in your medication:  Decrease Metolazone to 5 mg Two Times Weekly    Labwork: Your physician recommends that you return for lab work in: Today    Testing/Procedures: NONE   Follow-Up: Your physician recommends that you schedule a follow-up appointment in: 1 Month with Dr. [name]   Any Other Special Instructions Will Be Listed Below (If Applicable).     If you need a refill on your cardiac medications before your next appointment, please call your pharmacy.  Thank you for choosing [facility]!

## 2016-11-14 NOTE — Progress Notes (Signed)
Cardiology Office Note    Date:  11/14/2016   ID:  [name], DOB 1932-09-24, MRN [id number]  PCP:  [name], MD  Cardiologist: Dr. [name]  Chief Complaint  Patient presents with  . Follow-up    History of Present Illness:  [name] is a 83 y.o. female with history of CAF on Coumadin, CAD S/P stent RCA 2003 with 60% Cfx, OM1, 80% OM2, HTN, GIB 06/2016 with AVM's lymphedema last saw Dr. [name] 10/24/16 for 10 lb weight gain with worsening lymphedema. She was hospitalized in May and taken off Demadex and placed on metolazone, but then switched back to [facility] and then Lasix.  10/24/16 she switched back to [facility] and was given metolazone 5 mg TID and sent to lymphedema clinic for mechanical compression.BMET 7/18 K 3.1 Crt 1.08.  Patient comes in today accompanied by her daughter. She has lost 18 pounds and is feeling much better. She has an appointment with the lymphedema clinic August 9. She still has some edema. She doesn't know what her baseline weight is any more. Her breathing has improved. She denies any chest pain, palpitations, dizziness or presyncope.    Past Medical History:  Diagnosis Date  . Angiodysplasia of stomach and duodenum   . AV malformation of gastrointestinal tract   . Carotid artery disease ([place])    RIght CEA 05/2004  . Chronic anticoagulation   . Chronic atrial fibrillation ([place])   . Chronic blood loss anemia   . Chronic diastolic CHF (congestive heart failure) ([place])   . Coronary atherosclerosis of native coronary artery    a. RCA stent 2003 with 60% LCx, 60% OM1, 80% OM2 disease at that time.  . Essential hypertension, benign   . Gastric ulcer   . GI bleeding    Recurrent GIB (prior gastric ulcer/diverticulosis/AVM in 2017, gastric AVM s/p endoscopic therapy in 05/2016, recurrent GIB 06/2016 with AVM s/p therapy on endoscopy.  . Hyperlipidemia   . Lymphedema    a. in general, her LE edema appears to be a poor indicator of her  total fluid status - chronic edema.  . Obesity   . Renal artery stenosis (HCC)    Bilateral renal artery stents 2004  . Tobacco abuse, in remission    40 pack year stopped in 1989    Past Surgical History:  Procedure Laterality Date  . APPENDECTOMY    . CAROTID ENDARTERECTOMY     Right in 2005, left in 2010  . COLONOSCOPY  Never   Declines  . COLONOSCOPY N/A 08/19/2015   Procedure: COLONOSCOPY;  Surgeon: Rogene Houston, MD;  [facility];  Service: Endoscopy;  Laterality: N/A;  7:30  . CORONARY ANGIOPLASTY    . ESOPHAGOGASTRODUODENOSCOPY N/A 08/19/2015   Procedure: ESOPHAGOGASTRODUODENOSCOPY (EGD);  Surgeon: Rogene Houston, MD;  [facility];  Service: Endoscopy;  Laterality: N/A;  . ESOPHAGOGASTRODUODENOSCOPY N/A 05/24/2016   Procedure: ESOPHAGOGASTRODUODENOSCOPY (EGD);  Surgeon: Rogene Houston, MD;  [facility];  Service: Endoscopy;  Laterality: N/A;  . ESOPHAGOGASTRODUODENOSCOPY N/A 06/19/2016   Procedure: ESOPHAGOGASTRODUODENOSCOPY (EGD);  Surgeon: Rogene Houston, MD;  [facility];  Service: Endoscopy;  Laterality: N/A;  . ESOPHAGOGASTRODUODENOSCOPY N/A 09/05/2016   Procedure: ESOPHAGOGASTRODUODENOSCOPY (EGD);  Surgeon: Rogene Houston, MD;  [facility];  Service: Endoscopy;  Laterality: N/A;  . GIVENS CAPSULE STUDY N/A 06/20/2016   Procedure: GIVENS CAPSULE STUDY;  Surgeon: Rogene Houston, MD;  [facility];  Service: Endoscopy;  Laterality: N/A;    Current Medications: Current Meds  Medication Sig  . acetaminophen (TYLENOL) 500 MG tablet Take 500 mg by mouth every 6 (six) hours as needed for headache.  Marland Kitchen atorvastatin (LIPITOR) 80 MG tablet TAKE 1 TABLET EVERY NIGHT AT BEDTIME  . bisoprolol (ZEBETA) 5 MG tablet take 1 tablet by mouth twice a day  . pantoprazole (PROTONIX) 40 MG tablet Take 1 tablet (40 mg total) by mouth 2 (two) times daily before a meal.  . Pediatric Multivitamins-Iron (FLINTSTONES PLUS IRON  PO) Take 1 tablet by mouth 2 (two) times daily.   . potassium chloride SA (K-DUR,KLOR-CON) 20 MEQ tablet Take 40 meq (2 tablets) in the am , and take 20 meq (1 tablet) in the pm  . torsemide (DEMADEX) 20 MG tablet Take 4 tablets (80 mg total) by mouth 2 (two) times daily.  Marland Kitchen warfarin (COUMADIN) 5 MG tablet Take 1 tablet daily except 1/2 tablet on Tuesdays and Saturdays or as directed by Coumadin Clinic (Patient taking differently: Take 2.5-5 mg by mouth daily. Take 1 tablet daily except 1/2 tablet on Tuesdays and Saturdays or as directed by Coumadin Clinic)  . [DISCONTINUED] metolazone (ZAROXOLYN) 5 MG tablet Take 1 tablet (5 mg total) by mouth every Monday, Wednesday, and Friday.     Allergies:   Diltiazem and Verapamil   Social History   Social History  . Marital status: Widowed    Spouse name: N/A  . Number of children: 4  . Years of education: N/A   Occupational History  . housewife Retired   Social History Main Topics  . Smoking status: Former Smoker    Types: Cigarettes    Quit date: 04/30/1987  . Smokeless tobacco: Never Used  . Alcohol use No  . Drug use: No  . Sexual activity: Not Asked   Other Topics Concern  . None   Social History Narrative  . None     Family History:  The patient's family history includes Cancer in her mother; Heart disease in her maternal grandmother; Hyperlipidemia in her daughter; Hypertension in her daughter, daughter, and son; Lung cancer in her mother.   ROS:   Please see the history of present illness.    Review of Systems  Constitution: Negative.  HENT: Negative.   Eyes: Positive for visual disturbance.       Cataract surgery scheduled for Sept.  Cardiovascular: Positive for leg swelling.  Respiratory: Negative.   Hematologic/Lymphatic: Negative.   Musculoskeletal: Positive for myalgias. Negative for joint pain.  Gastrointestinal: Negative.   Genitourinary: Negative.   Neurological: Negative.    All other systems reviewed and  are negative.   PHYSICAL EXAM:   VS:  BP (!) 130/58   Pulse 87   Ht 5\' 1"  (1.549 m)   Wt 154 lb 12.8 oz (70.2 kg)   SpO2 98%   BMI 29.25 kg/m   Physical Exam  GEN: Well nourished, well developed, in no acute distress  Neck: no JVD, carotid bruits, or masses Cardiac:Irregular irregular with 2/6 systolic murmur at the left sternal border Respiratory:  clear to auscultation bilaterally, normal work of breathing GI: soft, nontender, nondistended, + BS Ext: Lymphedema with significant varicosities Neuro:  Alert and Oriented x 3 Psych: euthymic mood, full affect  Wt Readings from Last 3 Encounters:  11/14/16 154 lb 12.8 oz (70.2 kg)  10/24/16 172 lb (78 kg)  10/02/16 159 lb (72.1 kg)      Studies/Labs Reviewed:   EKG:  EKG is not ordered today.   Recent Labs: 05/09/2016: TSH 1.76 08/25/2016: B Natriuretic Peptide 438.0 09/04/2016: ALT 17 09/06/2016: Hemoglobin 8.3; Magnesium 1.9; Platelets 148 10/31/2016: BUN 33; Creat 1.08; Potassium 3.1; Sodium 139   Lipid Panel    Component Value Date/Time   CHOL 91 (L) 12/22/2014 1035   TRIG 79 12/22/2014 1035   HDL 34 (L) 12/22/2014 1035   CHOLHDL 2.7 12/22/2014 1035   VLDL 16 12/22/2014 1035   LDLCALC 41 12/22/2014 1035    Additional studies/ records that were reviewed today include:   2-D echo 5/13/18Study Conclusions   - Left ventricle: The cavity size was normal. Wall thickness was   increased in a pattern of mild LVH. Systolic function was normal.   The estimated ejection fraction was 60%. Wall motion was normal;   there were no regional wall motion abnormalities. The study is   not technically sufficient to allow evaluation of LV diastolic   function. - Aortic valve: Mildly to moderately calcified annulus. Trileaflet;   mildly calcified leaflets. There was no stenosis. - Mitral valve: Mildly thickened leaflets . There was mild   regurgitation. - Left atrium: The atrium was moderately dilated. - Right ventricle: The  cavity size was mildly dilated. Systolic   function was mildly reduced. TAPSE: 14.3 mm . - Right atrium: The atrium was severely dilated. - Atrial septum: No defect or patent foramen ovale was identified. - Tricuspid valve: There was moderate-severe regurgitation. - Pulmonary arteries: PA peak pressure: 41 mm Hg (S). - Inferior vena cava: The vessel was dilated. The respirophasic   diameter changes were blunted (< 50%), consistent with elevated   central venous pressure. Estimated CVP 15 mmHg.      ASSESSMENT:    1. Chronic diastolic CHF (congestive heart failure) (HCC)   2. Lymphedema   3. Atherosclerosis of native coronary artery of native heart without angina pectoris   4. Chronic atrial fibrillation (HCC)   5. Essential hypertension   6. CKD (chronic kidney disease) stage 3, GFR 30-59 ml/min      PLAN:  In order of problems listed above:  Chronic diastolic CHF improved with 18 pound weight loss with the addition of metolazone. Will continue current Demadex dose at 80 mg twice a day. Decrease metolazone to twice weekly. She can take an extra metolazone if needed for weight gain of 2 or 3 pounds overnight. Check bmet today. Follow-up with Dr. [name] in one month.  Lymphedema to be seen at the lymphedema clinic August 9  CAD without angina  Chronic atrial fibrillation with controlled rate on bisoprolol and Coumadin  Essential hypertension well controlled  CK D stage III creatinine was 1.08 on 10/31/16. Recheck today.     Medication Adjustments/Labs and Tests Ordered: Current medicines are reviewed at length with the patient today.  Concerns regarding medicines are outlined above.  Medication changes, Labs and Tests ordered today are listed in the Patient Instructions below. There are no Patient Instructions on file for this visit.   Sumner Boast, PA-C  11/14/2016 11:41 AM    [facility] [address], [place], [place]  [address] Phone:  [phone]; Fax: [phone]

## 2016-11-15 ENCOUNTER — Telehealth (INDEPENDENT_AMBULATORY_CARE_PROVIDER_SITE_OTHER): Payer: Self-pay | Admitting: *Deleted

## 2016-11-15 LAB — BASIC METABOLIC PANEL
BUN: 34 mg/dL — ABNORMAL HIGH (ref 7–25)
CO2: 30 mmol/L (ref 20–31)
Calcium: 9.7 mg/dL (ref 8.6–10.4)
Chloride: 94 mmol/L — ABNORMAL LOW (ref 98–110)
Creat: 1.29 mg/dL — ABNORMAL HIGH (ref 0.60–0.88)
GLUCOSE: 141 mg/dL — AB (ref 65–99)
POTASSIUM: 4.4 mmol/L (ref 3.5–5.3)
SODIUM: 145 mmol/L (ref 135–146)

## 2016-11-15 NOTE — Telephone Encounter (Signed)
Daughter Vaughan Basta called and asked for her mom if she still needed to be taking the Pantoprazole daily  She takes a lot of medication and is hoping she still does not need this.  Please call Vaughan Basta back [phone]

## 2016-11-15 NOTE — Telephone Encounter (Signed)
Per Dr.Rehman the patient is to continue the Pantoprazole, (Protonix). I have called the patient to let her know.

## 2016-11-19 ENCOUNTER — Telehealth: Payer: Self-pay | Admitting: *Deleted

## 2016-11-19 DIAGNOSIS — R944 Abnormal results of kidney function studies: Secondary | ICD-10-CM

## 2016-11-19 NOTE — Telephone Encounter (Signed)
Lab order placed and pt notified.

## 2016-11-19 NOTE — Telephone Encounter (Signed)
-----   Message from [name], [place] sent at 11/16/2016  4:07 PM EDT ----- Kidney function up but not surprising on all the diuretics. Recheck bmet in 2 weeks on lower metolazone dose that I gave her at ov.

## 2016-11-20 ENCOUNTER — Other Ambulatory Visit: Payer: Self-pay | Admitting: *Deleted

## 2016-11-20 NOTE — Patient Outreach (Signed)
[facility]) Care Management  [facility] Care Manager  11/20/2016   [name] 1932-05-17 [id number]  [name] is an 83 y.o. female with a PMH of PVD, HTN, hyperlipidemia, CKD/ACRF stage 3, AV malformation of GI tract, Carotid Artery Disease, Atrial fibrillation (on chronic anticoagulation with coumadin), Chronic diastolic CHF, 13 blood infusions and with a hx of recent discontinuation of IV iron a few weeks agothat presented with dizziness, lightheadedness upon standing, lower abdominal pain, chronically swollen legs (lymphedema), ulcer, obesity, bright red blood per rectum, nosebleeds to ED on 09/04/16 and was admitted for GI bleed/acute anemia after an ED course showed H/H 6.6/23.0, platelets of 133, potassium slightly low at 3.2, Cr of 1.11, positive orthostatic vital signs, INR of 3.02, negative troponin, hemoccult positive, rectal exam with maroon colored stool. Gastroenterology consulted by EDP.  [name] has had 4 admissions in the last 6 months  [facility] Cm received [name] on 09/05/16 as a referral from [place] hospital liaison to engage for transition of care calls and evaluate for monthly home visits  [facility] CM is completing a telephonic assessment with [name] today   Subjective: "I feel much better" "I am doing well and I don't need anything" Objective:  Wt 154 lb (69.9 kg)   BMI 29.10 kg/m  per [name]  Encounter Medications:  Outpatient Encounter Prescriptions as of 11/20/2016  Medication Sig Note  . acetaminophen (TYLENOL) 500 MG tablet Take 500 mg by mouth every 6 (six) hours as needed for headache.   Marland Kitchen atorvastatin (LIPITOR) 80 MG tablet TAKE 1 TABLET EVERY NIGHT AT BEDTIME   . bisoprolol (ZEBETA) 5 MG tablet take 1 tablet by mouth twice a day   . metolazone (ZAROXOLYN) 5 MG tablet Take 1 tablet (5 mg total) by mouth 2 (two) times a week.   . pantoprazole (PROTONIX) 40 MG tablet Take 1 tablet (40 mg total) by mouth 2  (two) times daily before a meal.   . Pediatric Multivitamins-Iron (FLINTSTONES PLUS IRON PO) Take 1 tablet by mouth 2 (two) times daily.  09/07/2016: No longer taking iron with mvi   . potassium chloride SA (K-DUR,KLOR-CON) 20 MEQ tablet Take 40 meq (2 tablets) in the am , and take 20 meq (1 tablet) in the pm   . torsemide (DEMADEX) 20 MG tablet Take 4 tablets (80 mg total) by mouth 2 (two) times daily.   Marland Kitchen warfarin (COUMADIN) 5 MG tablet Take 1 tablet daily except 1/2 tablet on Tuesdays and Saturdays or as directed by Coumadin Clinic (Patient taking differently: Take 2.5-5 mg by mouth daily. Take 1 tablet daily except 1/2 tablet on Tuesdays and Saturdays or as directed by Coumadin Clinic)    No facility-administered encounter medications on file as of 11/20/2016.     Functional Status:  In your present state of health, do you have any difficulty performing the following activities: 11/20/2016 09/07/2016  Hearing? [name]  Vision? N N  Difficulty concentrating or making decisions? N N  Walking or climbing stairs? N N  Dressing or bathing? N N  Doing errands, shopping? N N  Preparing Food and eating ? N N  Using the Toilet? N N  In the past six months, have you accidently leaked urine? N -  Do you have problems with loss of bowel control? N -  Managing your Medications? N -  Managing your Finances? N -  Housekeeping or managing your Housekeeping? N -  Some recent data might  be hidden    Fall/Depression Screening: Fall Risk  11/20/2016  Falls in the past year? No  Risk for fall due to : Impaired balance/gait;Impaired mobility   PHQ 2/9 Scores 11/20/2016  PHQ - 2 Score 1    Assessment:  [name] informed [facility] CM during the telephonic assessment that she has seen [name] and completed labs with [name] for "my kidneys" and she was contacted by [name] to inform her that her lab was "four point something" [name] has changed her diuretic frequently since last contact with her but is  now on metolazone to 5 mg twice a week [name] has an appointment with the lymphedema clinic on Thursday November 22 2016 at 1 pm "for wound care" [name] reports her daughter "[name] will take me" [name] reports no weeping of her extremities and weight loss. Reports she had gain 10 -15 lbs in July 2018 but weight today was 154 lbs. Continues to have difficulty following diet [name] denied need for any further home visits or contact "I am doing well"  Plan:  [facility] CM assessed for further needs related to CHF, lymphedema, weight & diet- Reminded her of the symptoms for chf levels to report to her medical providers and encouraged her to continue to monitor her weights and vital signs especially her bp [facility] CM inquired if there were questions about the CHF and the Afib resources CM had left and she stated she would review.  She informs [facility] CM her daughter now has these resources and she does not have questions [facility] CM sent closure letters to [name] and [name] [facility] CM updated [facility] CMA, [name] for case closure for Consumer has withdrawn from program  Note routed to medical providers listed in [place] Problem One     Most Recent Value  Care Plan Problem One  knowledge deficit of home care for chronic medical issues (CAD, HTN, CHF) by evidence of 4 admissions in last 6 months  Role Documenting the Problem One  Care Management Coordinator  Care Plan for Problem One  Active  [facility] Long Term Goal   over the next 90 days patient and family will verbalize and demonstrate interventions for home care of chronic medical issues that decreases hospital admissions and ED visits   [facility] Long Term Goal Start Date  09/07/16  [facility] Long Term Goal Met Date  11/20/16  Interventions for Problem One Long Term Goal  Assess knowledge of chronic medical issues, educate on home care for chronic medical issues, discuss zones for chf, assist with obtaining DME needed to monitor chronic medical  issues  [facility] CM Short Term Goal #1   over the next 45 days patient and family will verbalize understanding of home care for chronic medical issues  [facility] CM Short Term Goal #1 Start Date  09/07/16  [facility] CM Short Term Goal #1 Met Date  11/20/16  Interventions for Short Term Goal #1  Assess knowledge of chronic medical issues, educate on home care for chronic medical issues, discuss zones for chf, assist with obtaining DME needed to monitor chronic medical issues    [facility] CM Care Plan Problem Two     Most Recent Value  Care Plan Problem Two  medication discrepancies and compliance with taking medications as ordered   Role Documenting the Problem Two  Care Management [place] for Problem Two  Active  Interventions for Problem Two Long Term Goal  assess medication issues, educate patient/family as Cm assist with clarifying medications, review medications, consult with MDs and pharmacy as needed, evaluate intake of medications  [facility] Long Term Goal  over the next 45 days patient and family medication descrepancies will be resolved and patient will verbalize increased compliance with medication intake as ordered by MDs   [place] Term Goal Start Date  09/07/16  [facility] Long Term Goal Met Date  11/20/16  [facility] CM Short Term Goal #1   over the next 14 days medication discrepanies will be resolved and patient will verbalize importance of taking each medication as ordered  [facility] CM Short Term Goal #1 Start Date  09/07/16  [facility] CM Short Term Goal #1 Met Date   11/20/16  Interventions for Short Term Goal #2   assess medication issues, educate patient/family as Cm assist with clarifying medications, review medications, consult with MDs and pharmacy as needed, evaluate intake of medications       Kimberly L. Lavina Hamman, RN, BSN, [place] Care Management [phone]

## 2016-11-22 ENCOUNTER — Ambulatory Visit (HOSPITAL_COMMUNITY): Payer: Medicare Other | Attending: Cardiology | Admitting: Physical Therapy

## 2016-11-22 DIAGNOSIS — I890 Lymphedema, not elsewhere classified: Secondary | ICD-10-CM | POA: Diagnosis not present

## 2016-11-23 NOTE — Addendum Note (Signed)
Addended by: Leeroy Cha on: 11/23/2016 02:01 PM   Modules accepted: Orders

## 2016-11-23 NOTE — Therapy (Addendum)
[facility] [address] [place], [place], [address] Phone: [phone]   Fax:  [phone]  Physical Therapy Evaluation  Patient Details  Name: DOREA DUFF MRN: [id number] Date of Birth: 07/01/1932 Referring Provider: Rozann Lesches  Encounter Date: 11/22/2016      PT End of Session - 11/23/16 1058    Visit Number 1   Number of Visits 12   Date for PT Re-Evaluation 12/23/16   Authorization - Visit Number 1   Authorization - Number of Visits 12   PT Start Time 1300   PT Stop Time 1425   PT Time Calculation (min) 85 min   Activity Tolerance Patient tolerated treatment well   Behavior During Therapy [facility] for tasks assessed/performed      Past Medical History:  Diagnosis Date  . Angiodysplasia of stomach and duodenum   . AV malformation of gastrointestinal tract   . Carotid artery disease ([place])    RIght CEA 05/2004  . Chronic anticoagulation   . Chronic atrial fibrillation ([place])   . Chronic blood loss anemia   . Chronic diastolic CHF (congestive heart failure) ([place])   . Coronary atherosclerosis of native coronary artery    a. RCA stent 2003 with 60% LCx, 60% OM1, 80% OM2 disease at that time.  . Essential hypertension, benign   . Gastric ulcer   . GI bleeding    Recurrent GIB (prior gastric ulcer/diverticulosis/AVM in 2017, gastric AVM s/p endoscopic therapy in 05/2016, recurrent GIB 06/2016 with AVM s/p therapy on endoscopy.  . Hyperlipidemia   . Lymphedema    a. in general, her LE edema appears to be a poor indicator of her total fluid status - chronic edema.  . Obesity   . Renal artery stenosis (H[facility])    Bilateral renal artery stents 2004  . Tobacco abuse, in remission    40 pack year stopped in 1989    Past Surgical History:  Procedure Laterality Date  . APPENDECTOMY    . CAROTID ENDARTERECTOMY     Right in 2005, left in 2010  . COLONOSCOPY  Never   Declines  . COLONOSCOPY N/A 08/19/2015   Procedure:  COLONOSCOPY;  Surgeon: Rogene Houston, MD;  [facility];  Service: Endoscopy;  Laterality: N/A;  7:30  . CORONARY ANGIOPLASTY    . ESOPHAGOGASTRODUODENOSCOPY N/A 08/19/2015   Procedure: ESOPHAGOGASTRODUODENOSCOPY (EGD);  Surgeon: Rogene Houston, MD;  Location: [facility];  Service: Endoscopy;  Laterality: N/A;  . ESOPHAGOGASTRODUODENOSCOPY N/A 05/24/2016   Procedure: ESOPHAGOGASTRODUODENOSCOPY (EGD);  Surgeon: Rogene Houston, MD;  Location: [facility];  Service: Endoscopy;  Laterality: N/A;  . ESOPHAGOGASTRODUODENOSCOPY N/A 06/19/2016   Procedure: ESOPHAGOGASTRODUODENOSCOPY (EGD);  Surgeon: Rogene Houston, MD;  [facility];  Service: Endoscopy;  Laterality: N/A;  . ESOPHAGOGASTRODUODENOSCOPY N/A 09/05/2016   Procedure: ESOPHAGOGASTRODUODENOSCOPY (EGD);  Surgeon: Rogene Houston, MD;  [facility];  Service: Endoscopy;  Laterality: N/A;  . GIVENS CAPSULE STUDY N/A 06/20/2016   Procedure: GIVENS CAPSULE STUDY;  Surgeon: Rogene Houston, MD;  Location: [facility];  Service: Endoscopy;  Laterality: N/A;    There were no vitals filed for this visit.       Subjective Assessment - 11/23/16 1055    Subjective Ms. Crutcher states that she has been diagnosed with lymphedema for the past year.  She has no significant knowledge of the disease.     Patient is accompained by: Family member   Pertinent  History Afib, hyperlipedemia, HTN.    Limitations Standing;Walking;House hold activities   Currently in Pain? No/denies            [facility] PT Assessment - 11/23/16 0001      Assessment   Medical Diagnosis B LE Lymphedema   Onset Date/Surgical Date 11/29/15   Next MD Visit 12/18/2016   Prior Therapy none     Precautions   Precautions None     Restrictions   Weight Bearing Restrictions No     Balance Screen   Has the patient fallen in the past 6 months No   Has the patient had a decrease in activity level because of a fear of falling?  No   Is the  patient reluctant to leave their home because of a fear of falling?  No     Home Ecologist residence     Prior Function   Level of Independence Independent     Cognition   Overall Cognitive Status Within Functional Limits for tasks assessed           LYMPHEDEMA/ONCOLOGY QUESTIONNAIRE - 11/23/16 1055      Date Lymphedema/Swelling Started   Date 11/19/15     What other symptoms do you have   Are you Having Heaviness or Tightness Yes   Are you having Pain No   Are you having pitting edema Yes   Is it Hard or Difficult finding clothes that fit Yes   Do you have infections No   Stemmer Sign No     Lymphedema Stage   Stage STAGE 2 SPONTANEOUSLY IRREVERSIBLE     Lymphedema Assessments   Lymphedema Assessments Lower extremities     Right Lower Extremity Lymphedema   At Midpatella/Popliteal Crease 44.3 cm   30 cm Proximal to Floor at Lateral Plantar Foot 42.4 cm   20 cm Proximal to Floor at Lateral Plantar Foot 37.5 1   10  cm Proximal to Floor at Lateral Malleoli 30.3 cm   Circumference of ankle/heel 37.5 cm.   5 cm Proximal to 1st MTP Joint 24.8 cm   Across MTP Joint 26 cm   Around Proximal Great Toe 9.5 cm     Left Lower Extremity Lymphedema   At Midpatella/Popliteal Crease 41.2 cm   30 cm Proximal to Floor at Lateral Plantar Foot 43.5 cm   20 cm Proximal to Floor at Lateral Plantar Foot 36.9 cm   10 cm Proximal to Floor at Lateral Malleoli 29.5 cm   Circumference of ankle/heel 37 cm.   5 cm Proximal to 1st MTP Joint 25.1 cm   Across MTP Joint 26.8 cm   Around Proximal Great Toe 9.7 cm         Objective measurements completed on examination: See above findings.          [facility] Adult PT Treatment/Exercise - 11/23/16 0001      Manual Therapy   Manual Therapy Manual Lymphatic Drainage (MLD);Compression Bandaging   Manual therapy comments done seperate from all other aspects of treatment   Manual Lymphatic Drainage (MLD) To  include supraclavicular, deep and superficial abdominal, routing fluid using inguinal/axillary anastomosis and LE's.     Compression Bandaging Profore to B LE done seperate from all other aspects.                 PT Education - 11/22/16 1057    Education provided Yes   Education Details Lymphedema and the disease process, the importance of compression  and Exercises to increase lymph circulation.    Person(s) Educated Patient;Child(ren)   Methods Explanation   Comprehension Verbalized understanding          PT Short Term Goals - 11/23/16 1256      PT SHORT TERM GOAL #1   Title Pt to be completing LE exercises to promote lymph circulation to assist in decreasing edema on a daily basis.    Time 1   Period Weeks   Status New   Target Date 11/29/16     PT SHORT TERM GOAL #2   Title Pt to be able to verbalize signs and symptoms of cellulitis and the need to get immediate medical attention    Time 1   Period Weeks   Status New   Target Date 11/29/16     PT SHORT TERM GOAL #3   Title Pt volume of LE edema to be decreased by 2 cm to reduce risk of cellulitis and allow donning of clothing to be easier.    Time 2   Period Weeks   Status New   Target Date 12/06/16           PT Long Term Goals - 11/23/16 1259      PT LONG TERM GOAL #1   Title Pt to have obtained and be using her compression pump on a daily basis    Time 6   Period Weeks   Status New   Target Date 01/03/17     PT LONG TERM GOAL #2   Title Pt to have obtained and be able to done her compression garment    Time 6   Period Weeks   Status New   Target Date 01/03/17     PT LONG TERM GOAL #3   Title Pt to have lost 3 cm volume from her LE to allow donning of clothing to be easier.   Time 6   Period Weeks   Status New   Target Date 01/03/17                Plan - 11/23/16 1101    Clinical Impression Statement Ms. Saulnier is an 83 yo female who has been diagnosed with lymphedema for  over a year.  She has had no education or treatment for the condition and has basically been told that she will need to live with it.  Her current MD is sending her to skilled physical therapy to address the edema and educate the patient on the disease process.  Ms. Crear will benefit from skilled physical therapy to reduce her volume and educate her on self care to keep the volume of fluid in her LE to a minimal to prevent cellulitis.     History and Personal Factors relevant to plan of care: A fib, HTN,  hyperlipidema    Clinical Presentation Evolving   Clinical Decision Making Moderate   PT Frequency 2x / week   PT Duration 6 weeks   PT Treatment/Interventions ADLs/Self Care Home Management;Patient/family education;Compression bandaging;Manual lymph drainage;Manual techniques;Therapeutic exercise   PT Next Visit Plan Continue with manual and compression bandaging.  Therapist will contact fitter re cut to fit bandages.      Patient will benefit from skilled therapeutic intervention in order to improve the following deficits and impairments:  Abnormal gait, Decreased activity tolerance, Increased edema, Difficulty walking  Visit Diagnosis: Lymphedema, not elsewhere classified      G-Codes - Dec 02, 2016 1302    Functional Assessment Tool Used (Outpatient Only)  life impact   Functional Limitation Other PT primary   Other PT Primary Goal Status [phone]) At least 40 percent but less than 60 percent impaired, limited or restricted   Other PT Primary Discharge Status [phone]) At least 20 percent but less than 40 percent impaired, limited or restricted       Problem List Patient Active Problem List   Diagnosis Date Noted  . Lymphedema 11/14/2016  . CKD (chronic kidney disease) stage 3, GFR 30-59 ml/min 09/05/2016  . Hypokalemia 09/05/2016  . CHF exacerbation ([place]) 08/25/2016  . Acute GI bleeding 06/17/2016  . Difficulty in walking, not elsewhere classified   . Dizziness and giddiness   .  Gastrointestinal hemorrhage associated with peptic ulcer   . Renal impairment   . Acute blood loss anemia 05/22/2016  . Chronic blood loss anemia 05/22/2016  . Chronic diastolic CHF (congestive heart failure) ([place]) 05/22/2016  . Acute renal failure superimposed on stage 3 chronic kidney disease ([place]) 05/22/2016  . Anticoagulated on Coumadin   . Symptomatic anemia 07/11/2015  . Microcytic anemia 07/11/2015  . Fatigue 07/11/2015  . Occlusion and stenosis of carotid artery without mention of cerebral infarction-Bilateral 11/11/2013  . Aftercare following surgery of the circulatory system, [place] 11/11/2013  . Varicose veins of lower extremities with other complications 65/06/5463  . Encounter for therapeutic drug monitoring 05/18/2013  . Fasting hyperglycemia 07/03/2012  . Chronic anticoagulation   . Tobacco abuse, in remission   . Carotid artery occlusion   . Hypertension   . Obesity   . Hyperlipidemia   . Chronic venous insufficiency 07/26/2010  . Chronic atrial fibrillation ([place]) 04/05/2010  . Coronary atherosclerosis of native coronary artery 04/05/2010  . PERIPHERAL VASCULAR DISEASE 04/05/2010   Rayetta Humphrey, PT CLT [phone] 11/23/2016, 1:03 PM  [facility] [address] [place], [place], [address] Phone: [phone]   Fax:  [phone]  Name: MITCHELL EPLING MRN: [id number] Date of Birth: [birth year]

## 2016-11-26 ENCOUNTER — Ambulatory Visit (HOSPITAL_COMMUNITY): Payer: Medicare Other | Admitting: Physical Therapy

## 2016-11-26 DIAGNOSIS — I890 Lymphedema, not elsewhere classified: Secondary | ICD-10-CM | POA: Diagnosis not present

## 2016-11-26 NOTE — Therapy (Signed)
[facility] [address] [place], [place], [address] Phone: [phone]   Fax:  [phone]  Physical Therapy Treatment  Patient Details  Name: [name] MRN: [id number] Date of Birth: 1933-03-16 Referring Provider: Rozann Lesches  Encounter Date: 11/26/2016      PT End of Session - 11/26/16 1405    Visit Number 2   Number of Visits 2   Authorization - Visit Number 2   PT Start Time 9379   PT Stop Time 1400   PT Time Calculation (min) 15 min   Activity Tolerance Patient tolerated treatment well      Past Medical History:  Diagnosis Date  . Angiodysplasia of stomach and duodenum   . AV malformation of gastrointestinal tract   . Carotid artery disease ([place])    RIght CEA 05/2004  . Chronic anticoagulation   . Chronic atrial fibrillation ([place])   . Chronic blood loss anemia   . Chronic diastolic CHF (congestive heart failure) ([place])   . Coronary atherosclerosis of native coronary artery    a. RCA stent 2003 with 60% LCx, 60% OM1, 80% OM2 disease at that time.  . Essential hypertension, benign   . Gastric ulcer   . GI bleeding    Recurrent GIB (prior gastric ulcer/diverticulosis/AVM in 2017, gastric AVM s/p endoscopic therapy in 05/2016, recurrent GIB 06/2016 with AVM s/p therapy on endoscopy.  . Hyperlipidemia   . Lymphedema    a. in general, her LE edema appears to be a poor indicator of her total fluid status - chronic edema.  . Obesity   . Renal artery stenosis ([facility])    Bilateral renal artery stents 2004  . Tobacco abuse, in remission    40 pack year stopped in 1989    Past Surgical History:  Procedure Laterality Date  . APPENDECTOMY    . CAROTID ENDARTERECTOMY     Right in 2005, left in 2010  . COLONOSCOPY  Never   Declines  . COLONOSCOPY N/A 08/19/2015   Procedure: COLONOSCOPY;  Surgeon: Rogene Houston, MD;  [facility];  Service: Endoscopy;  Laterality: N/A;  7:30  . CORONARY ANGIOPLASTY    .  ESOPHAGOGASTRODUODENOSCOPY N/A 08/19/2015   Procedure: ESOPHAGOGASTRODUODENOSCOPY (EGD);  Surgeon: Rogene Houston, MD;  [facility];  Service: Endoscopy;  Laterality: N/A;  . ESOPHAGOGASTRODUODENOSCOPY N/A 05/24/2016   Procedure: ESOPHAGOGASTRODUODENOSCOPY (EGD);  Surgeon: Rogene Houston, MD;  [facility];  Service: Endoscopy;  Laterality: N/A;  . ESOPHAGOGASTRODUODENOSCOPY N/A 06/19/2016   Procedure: ESOPHAGOGASTRODUODENOSCOPY (EGD);  Surgeon: Rogene Houston, MD;  [facility];  Service: Endoscopy;  Laterality: N/A;  . ESOPHAGOGASTRODUODENOSCOPY N/A 09/05/2016   Procedure: ESOPHAGOGASTRODUODENOSCOPY (EGD);  Surgeon: Rogene Houston, MD;  [facility];  Service: Endoscopy;  Laterality: N/A;  . GIVENS CAPSULE STUDY N/A 06/20/2016   Procedure: GIVENS CAPSULE STUDY;  Surgeon: Rogene Houston, MD;  [facility];  Service: Endoscopy;  Laterality: N/A;    There were no vitals filed for this visit.      Subjective Assessment - 11/26/16 1404    Subjective Pt states that she does not want to be seen today and she desires to be discharged as she does not have transportation and will not ride [employer]>   Currently in Pain? No/denies               PT Short Term Goals - 11/26/16 1408      PT  SHORT TERM GOAL #1   Title Pt to be completing LE exercises to promote lymph circulation to assist in decreasing edema on a daily basis.    Time 1   Period Weeks   Status Achieved     PT SHORT TERM GOAL #2   Title Pt to be able to verbalize signs and symptoms of cellulitis and the need to get immediate medical attention    Time 1   Period Weeks   Status Achieved     PT SHORT TERM GOAL #3   Title Pt volume of LE edema to be decreased by 2 cm to reduce risk of cellulitis and allow donning of clothing to be easier.    Time 2   Period Weeks   Status Not Met           PT Long Term Goals - 2016-12-15 1408      PT LONG TERM GOAL #1   Title Pt to  have obtained and be using her compression pump on a daily basis    Time 6   Period Weeks   Status Not Met     PT LONG TERM GOAL #2   Title Pt to have obtained and be able to done her compression garment    Time 6   Period Weeks   Status Not Met     PT LONG TERM GOAL #3   Title Pt to have lost 3 cm volume from her LE to allow donning of clothing to be easier.   Time 6   Period Weeks   Status Not Met               Plan - 2016-12-15 1405    Clinical Impression Statement Evaluation and goals reviewed with patient.  Therapist informed pt that she has ordered pt a pump which will be delivered to her home and she has inquired if her insurance would cover cut to fit compression.  She should expect phone calls from both [employer] [facility] re garments and [employer] re compression pump within the next week.    Rehab Potential Fair   PT Next Visit Plan Pt request discharge due to transportation issues.  Pt will hopefully acquire and use both compression pump and cut to fit garment.       Patient will benefit from skilled therapeutic intervention in order to improve the following deficits and impairments:     Visit Diagnosis: Lymphedema, not elsewhere classified       G-Codes - 12/15/16 1408    Functional Limitation Other PT primary   Other PT Primary Goal Status (I7782) At least 20 percent but less than 40 percent impaired, limited or restricted   Other PT Primary Discharge Status (U2353) At least 40 percent but less than 60 percent impaired, limited or restricted      Problem List Patient Active Problem List   Diagnosis Date Noted  . Lymphedema 11/14/2016  . CKD (chronic kidney disease) stage 3, GFR 30-59 ml/min 09/05/2016  . Hypokalemia 09/05/2016  . CHF exacerbation ([place]) 08/25/2016  . Acute GI bleeding 06/17/2016  . Difficulty in walking, not elsewhere classified   . Dizziness and giddiness   . Gastrointestinal hemorrhage associated with peptic ulcer   . Renal  impairment   . Acute blood loss anemia 05/22/2016  . Chronic blood loss anemia 05/22/2016  . Chronic diastolic CHF (congestive heart failure) ([place]) 05/22/2016  . Acute renal failure superimposed on stage 3 chronic kidney disease ([place]) 05/22/2016  .  Anticoagulated on Coumadin   . Symptomatic anemia 07/11/2015  . Microcytic anemia 07/11/2015  . Fatigue 07/11/2015  . Occlusion and stenosis of carotid artery without mention of cerebral infarction-Bilateral 11/11/2013  . Aftercare following surgery of the circulatory system, [place] 11/11/2013  . Varicose veins of lower extremities with other complications 17/40/8144  . Encounter for therapeutic drug monitoring 05/18/2013  . Fasting hyperglycemia 07/03/2012  . Chronic anticoagulation   . Tobacco abuse, in remission   . Carotid artery occlusion   . Hypertension   . Obesity   . Hyperlipidemia   . Chronic venous insufficiency 07/26/2010  . Chronic atrial fibrillation ([place]) 04/05/2010  . Coronary atherosclerosis of native coronary artery 04/05/2010  . PERIPHERAL VASCULAR DISEASE 04/05/2010    Rayetta Humphrey, PT CLT [phone] 11/26/2016, 2:09 PM  [facility] [address] [place], [place], [address] Phone: [phone]   Fax:  [phone]  Name: [name] MRN: [id number] Date of Birth: [birth year]  PHYSICAL THERAPY DISCHARGE SUMMARY  Visits from Start of Care: 1  Current functional level related to goals / functional outcomes: As above   Remaining deficits: As  above   Education / Equipment: HEP Plan: Patient agrees to discharge.  Patient goals were not met. Patient is being discharged due to the patient's request.  ?????        Rayetta Humphrey, [place] CLT [phone]

## 2016-11-28 ENCOUNTER — Ambulatory Visit (HOSPITAL_COMMUNITY): Payer: Medicare Other | Admitting: Physical Therapy

## 2016-11-30 ENCOUNTER — Encounter: Payer: Self-pay | Admitting: Vascular Surgery

## 2016-12-03 ENCOUNTER — Encounter (HOSPITAL_COMMUNITY): Payer: Self-pay | Admitting: Physical Therapy

## 2016-12-03 ENCOUNTER — Ambulatory Visit (INDEPENDENT_AMBULATORY_CARE_PROVIDER_SITE_OTHER): Payer: Medicare Other | Admitting: *Deleted

## 2016-12-03 DIAGNOSIS — Z5181 Encounter for therapeutic drug level monitoring: Secondary | ICD-10-CM

## 2016-12-03 DIAGNOSIS — I4891 Unspecified atrial fibrillation: Secondary | ICD-10-CM | POA: Diagnosis not present

## 2016-12-03 DIAGNOSIS — I482 Chronic atrial fibrillation, unspecified: Secondary | ICD-10-CM

## 2016-12-03 DIAGNOSIS — R944 Abnormal results of kidney function studies: Secondary | ICD-10-CM | POA: Diagnosis not present

## 2016-12-03 DIAGNOSIS — I2510 Atherosclerotic heart disease of native coronary artery without angina pectoris: Secondary | ICD-10-CM

## 2016-12-03 LAB — POCT INR: INR: 2.9

## 2016-12-04 ENCOUNTER — Telehealth: Payer: Self-pay

## 2016-12-04 LAB — BASIC METABOLIC PANEL
BUN: 44 mg/dL — AB (ref 7–25)
CHLORIDE: 93 mmol/L — AB (ref 98–110)
CO2: 35 mmol/L — AB (ref 20–32)
CREATININE: 1.36 mg/dL — AB (ref 0.60–0.88)
Calcium: 9.3 mg/dL (ref 8.6–10.4)
Glucose, Bld: 106 mg/dL — ABNORMAL HIGH (ref 65–99)
Potassium: 3.5 mmol/L (ref 3.5–5.3)
Sodium: 138 mmol/L (ref 135–146)

## 2016-12-04 NOTE — Telephone Encounter (Signed)
-----   Message from Michele M Lenze, PA-C sent at 12/04/2016  7:58 AM EDT ----- Kidney function up a little more. Decrease metolazone to once weekly or as needed. How is her weight and edema? 

## 2016-12-04 NOTE — Telephone Encounter (Signed)
Called pt. No answer, left message for pt to return call.  

## 2016-12-05 ENCOUNTER — Encounter (HOSPITAL_COMMUNITY): Payer: Self-pay | Admitting: Physical Therapy

## 2016-12-05 ENCOUNTER — Ambulatory Visit: Payer: Self-pay | Admitting: Family

## 2016-12-05 ENCOUNTER — Encounter (HOSPITAL_COMMUNITY): Payer: Self-pay

## 2016-12-06 ENCOUNTER — Other Ambulatory Visit: Payer: Self-pay | Admitting: Cardiology

## 2016-12-06 ENCOUNTER — Telehealth: Payer: Self-pay | Admitting: Cardiology

## 2016-12-06 NOTE — Telephone Encounter (Signed)
Please call patient regarding symptoms and blood work / tg

## 2016-12-06 NOTE — Telephone Encounter (Signed)
Pt made aware of lab results. She states that she is feeling bad today. She is having problems with her stomach. I asked her had she called her PCP and she said she had but was waiting on a return call from them. She denies any CP, SOB or swelling. I advised her to follow up with her PCP about her stomach ache. She voiced understanding.

## 2016-12-07 ENCOUNTER — Encounter (HOSPITAL_COMMUNITY): Payer: Self-pay | Admitting: Physical Therapy

## 2016-12-08 ENCOUNTER — Emergency Department (HOSPITAL_COMMUNITY): Payer: Medicare Other

## 2016-12-08 ENCOUNTER — Other Ambulatory Visit: Payer: Self-pay

## 2016-12-08 ENCOUNTER — Encounter (HOSPITAL_COMMUNITY): Payer: Self-pay | Admitting: Emergency Medicine

## 2016-12-08 ENCOUNTER — Inpatient Hospital Stay (HOSPITAL_COMMUNITY)
Admission: EM | Admit: 2016-12-08 | Discharge: 2016-12-11 | DRG: 378 | Disposition: A | Discharge status: Home / Self Care | Payer: Medicare Other | Attending: Internal Medicine | Admitting: Internal Medicine

## 2016-12-08 DIAGNOSIS — I2510 Atherosclerotic heart disease of native coronary artery without angina pectoris: Secondary | ICD-10-CM | POA: Diagnosis present

## 2016-12-08 DIAGNOSIS — Z9861 Coronary angioplasty status: Secondary | ICD-10-CM | POA: Diagnosis not present

## 2016-12-08 DIAGNOSIS — I130 Hypertensive heart and chronic kidney disease with heart failure and stage 1 through stage 4 chronic kidney disease, or unspecified chronic kidney disease: Secondary | ICD-10-CM | POA: Diagnosis present

## 2016-12-08 DIAGNOSIS — D500 Iron deficiency anemia secondary to blood loss (chronic): Secondary | ICD-10-CM | POA: Diagnosis present

## 2016-12-08 DIAGNOSIS — Z8711 Personal history of peptic ulcer disease: Secondary | ICD-10-CM | POA: Diagnosis not present

## 2016-12-08 DIAGNOSIS — D649 Anemia, unspecified: Secondary | ICD-10-CM | POA: Diagnosis not present

## 2016-12-08 DIAGNOSIS — Z7901 Long term (current) use of anticoagulants: Secondary | ICD-10-CM | POA: Diagnosis not present

## 2016-12-08 DIAGNOSIS — N390 Urinary tract infection, site not specified: Secondary | ICD-10-CM | POA: Diagnosis not present

## 2016-12-08 DIAGNOSIS — R000 Tachycardia, unspecified: Secondary | ICD-10-CM | POA: Diagnosis present

## 2016-12-08 DIAGNOSIS — N179 Acute kidney failure, unspecified: Secondary | ICD-10-CM | POA: Diagnosis present

## 2016-12-08 DIAGNOSIS — R778 Other specified abnormalities of plasma proteins: Secondary | ICD-10-CM

## 2016-12-08 DIAGNOSIS — N200 Calculus of kidney: Secondary | ICD-10-CM | POA: Diagnosis not present

## 2016-12-08 DIAGNOSIS — K31811 Angiodysplasia of stomach and duodenum with bleeding: Principal | ICD-10-CM | POA: Diagnosis present

## 2016-12-08 DIAGNOSIS — E785 Hyperlipidemia, unspecified: Secondary | ICD-10-CM | POA: Diagnosis present

## 2016-12-08 DIAGNOSIS — N3090 Cystitis, unspecified without hematuria: Secondary | ICD-10-CM | POA: Diagnosis present

## 2016-12-08 DIAGNOSIS — Z66 Do not resuscitate: Secondary | ICD-10-CM | POA: Diagnosis present

## 2016-12-08 DIAGNOSIS — R1084 Generalized abdominal pain: Secondary | ICD-10-CM | POA: Diagnosis not present

## 2016-12-08 DIAGNOSIS — I83893 Varicose veins of bilateral lower extremities with other complications: Secondary | ICD-10-CM | POA: Diagnosis present

## 2016-12-08 DIAGNOSIS — R791 Abnormal coagulation profile: Secondary | ICD-10-CM | POA: Diagnosis present

## 2016-12-08 DIAGNOSIS — R8271 Bacteriuria: Secondary | ICD-10-CM | POA: Diagnosis present

## 2016-12-08 DIAGNOSIS — I482 Chronic atrial fibrillation, unspecified: Secondary | ICD-10-CM | POA: Diagnosis present

## 2016-12-08 DIAGNOSIS — I10 Essential (primary) hypertension: Secondary | ICD-10-CM | POA: Diagnosis present

## 2016-12-08 DIAGNOSIS — Z87891 Personal history of nicotine dependence: Secondary | ICD-10-CM | POA: Diagnosis not present

## 2016-12-08 DIAGNOSIS — N183 Chronic kidney disease, stage 3 (moderate): Secondary | ICD-10-CM | POA: Diagnosis present

## 2016-12-08 DIAGNOSIS — R197 Diarrhea, unspecified: Secondary | ICD-10-CM | POA: Diagnosis not present

## 2016-12-08 DIAGNOSIS — I739 Peripheral vascular disease, unspecified: Secondary | ICD-10-CM | POA: Diagnosis present

## 2016-12-08 DIAGNOSIS — R110 Nausea: Secondary | ICD-10-CM | POA: Diagnosis not present

## 2016-12-08 DIAGNOSIS — I872 Venous insufficiency (chronic) (peripheral): Secondary | ICD-10-CM | POA: Diagnosis present

## 2016-12-08 DIAGNOSIS — Z8249 Family history of ischemic heart disease and other diseases of the circulatory system: Secondary | ICD-10-CM | POA: Diagnosis not present

## 2016-12-08 DIAGNOSIS — D6489 Other specified anemias: Secondary | ICD-10-CM | POA: Diagnosis not present

## 2016-12-08 DIAGNOSIS — R112 Nausea with vomiting, unspecified: Secondary | ICD-10-CM

## 2016-12-08 DIAGNOSIS — Z79899 Other long term (current) drug therapy: Secondary | ICD-10-CM

## 2016-12-08 DIAGNOSIS — Z801 Family history of malignant neoplasm of trachea, bronchus and lung: Secondary | ICD-10-CM | POA: Diagnosis not present

## 2016-12-08 DIAGNOSIS — Z5181 Encounter for therapeutic drug level monitoring: Secondary | ICD-10-CM | POA: Diagnosis not present

## 2016-12-08 DIAGNOSIS — K922 Gastrointestinal hemorrhage, unspecified: Secondary | ICD-10-CM | POA: Diagnosis not present

## 2016-12-08 DIAGNOSIS — R9431 Abnormal electrocardiogram [ECG] [EKG]: Secondary | ICD-10-CM

## 2016-12-08 DIAGNOSIS — I5032 Chronic diastolic (congestive) heart failure: Secondary | ICD-10-CM | POA: Diagnosis present

## 2016-12-08 DIAGNOSIS — R7989 Other specified abnormal findings of blood chemistry: Secondary | ICD-10-CM

## 2016-12-08 HISTORY — DX: Diverticulosis of intestine, part unspecified, without perforation or abscess without bleeding: K57.90

## 2016-12-08 LAB — COMPREHENSIVE METABOLIC PANEL
ALT: 18 U/L (ref 14–54)
AST: 38 U/L (ref 15–41)
Albumin: 3.3 g/dL — ABNORMAL LOW (ref 3.5–5.0)
Alkaline Phosphatase: 85 U/L (ref 38–126)
Anion gap: 14 (ref 5–15)
BILIRUBIN TOTAL: 1 mg/dL (ref 0.3–1.2)
BUN: 49 mg/dL — AB (ref 6–20)
CO2: 28 mmol/L (ref 22–32)
CREATININE: 1.61 mg/dL — AB (ref 0.44–1.00)
Calcium: 8.8 mg/dL — ABNORMAL LOW (ref 8.9–10.3)
Chloride: 91 mmol/L — ABNORMAL LOW (ref 101–111)
GFR calc Af Amer: 33 mL/min — ABNORMAL LOW (ref >60)
GFR, EST NON AFRICAN AMERICAN: 29 mL/min — AB (ref >60)
Glucose, Bld: 186 mg/dL — ABNORMAL HIGH (ref 65–99)
Potassium: 3.3 mmol/L — ABNORMAL LOW (ref 3.5–5.1)
Sodium: 133 mmol/L — ABNORMAL LOW (ref 135–145)
TOTAL PROTEIN: 7.2 g/dL (ref 6.5–8.1)

## 2016-12-08 LAB — CBC WITH DIFFERENTIAL/PLATELET
Basophils Absolute: 0 10*3/uL (ref 0.0–0.1)
Basophils Relative: 0 %
EOS PCT: 0 %
Eosinophils Absolute: 0 10*3/uL (ref 0.0–0.7)
HEMATOCRIT: 22.7 % — AB (ref 36.0–46.0)
HEMOGLOBIN: 6.9 g/dL — AB (ref 12.0–15.0)
LYMPHS ABS: 1.3 10*3/uL (ref 0.7–4.0)
LYMPHS PCT: 14 %
MCH: 24.1 pg — AB (ref 26.0–34.0)
MCHC: 30.4 g/dL (ref 30.0–36.0)
MCV: 79.4 fL (ref 78.0–100.0)
Monocytes Absolute: 0.6 10*3/uL (ref 0.1–1.0)
Monocytes Relative: 6 %
Neutro Abs: 7.6 10*3/uL (ref 1.7–7.7)
Neutrophils Relative %: 80 %
PLATELETS: 255 10*3/uL (ref 150–400)
RBC: 2.86 MIL/uL — AB (ref 3.87–5.11)
RDW: 16 % — ABNORMAL HIGH (ref 11.5–15.5)
WBC: 9.5 10*3/uL (ref 4.0–10.5)

## 2016-12-08 LAB — URINALYSIS, ROUTINE W REFLEX MICROSCOPIC
Bilirubin Urine: NEGATIVE
Glucose, UA: NEGATIVE mg/dL
Hgb urine dipstick: NEGATIVE
Ketones, ur: NEGATIVE mg/dL
Nitrite: NEGATIVE
PROTEIN: NEGATIVE mg/dL
Specific Gravity, Urine: 1.008 (ref 1.005–1.030)
pH: 7 (ref 5.0–8.0)

## 2016-12-08 LAB — PREPARE RBC (CROSSMATCH)

## 2016-12-08 LAB — POC OCCULT BLOOD, ED: Fecal Occult Bld: POSITIVE — AB

## 2016-12-08 LAB — TROPONIN I
TROPONIN I: 0.04 ng/mL — AB (ref <0.03)
TROPONIN I: 0.05 ng/mL — AB (ref <0.03)

## 2016-12-08 LAB — PROTIME-INR
INR: 3.02
Prothrombin Time: 32 seconds — ABNORMAL HIGH (ref 11.4–15.2)

## 2016-12-08 LAB — LIPASE, BLOOD: Lipase: 96 U/L — ABNORMAL HIGH (ref 11–51)

## 2016-12-08 MED ORDER — PANTOPRAZOLE SODIUM 40 MG IV SOLR
40.0000 mg | Freq: Two times a day (BID) | INTRAVENOUS | Status: DC
  Administered 2016-12-09 – 2016-12-11 (×5): 40 mg via INTRAVENOUS
  Filled 2016-12-08 (×5): qty 40

## 2016-12-08 MED ORDER — ONDANSETRON HCL 4 MG PO TABS
4.0000 mg | ORAL_TABLET | Freq: Four times a day (QID) | ORAL | Status: DC | PRN
  Administered 2016-12-11: 4 mg via ORAL
  Filled 2016-12-08: qty 1

## 2016-12-08 MED ORDER — POTASSIUM CHLORIDE CRYS ER 20 MEQ PO TBCR
40.0000 meq | EXTENDED_RELEASE_TABLET | Freq: Every day | ORAL | Status: DC
  Administered 2016-12-09 – 2016-12-11 (×4): 40 meq via ORAL
  Filled 2016-12-08 (×4): qty 2

## 2016-12-08 MED ORDER — POTASSIUM CHLORIDE CRYS ER 20 MEQ PO TBCR
20.0000 meq | EXTENDED_RELEASE_TABLET | Freq: Every day | ORAL | Status: DC
  Administered 2016-12-09 – 2016-12-10 (×2): 20 meq via ORAL
  Filled 2016-12-08 (×2): qty 1

## 2016-12-08 MED ORDER — PANTOPRAZOLE SODIUM 40 MG IV SOLR
40.0000 mg | Freq: Once | INTRAVENOUS | Status: AC
Start: 2016-12-08 — End: 2016-12-08
  Administered 2016-12-08: 40 mg via INTRAVENOUS
  Filled 2016-12-08: qty 40

## 2016-12-08 MED ORDER — SODIUM CHLORIDE 0.9 % IV SOLN
INTRAVENOUS | Status: DC
  Administered 2016-12-09 (×2): via INTRAVENOUS

## 2016-12-08 MED ORDER — ATORVASTATIN CALCIUM 40 MG PO TABS
80.0000 mg | ORAL_TABLET | Freq: Every day | ORAL | Status: DC
  Administered 2016-12-09 – 2016-12-10 (×3): 80 mg via ORAL
  Filled 2016-12-08 (×3): qty 2

## 2016-12-08 MED ORDER — BISOPROLOL FUMARATE 5 MG PO TABS
5.0000 mg | ORAL_TABLET | Freq: Two times a day (BID) | ORAL | Status: DC
  Administered 2016-12-09 – 2016-12-11 (×5): 5 mg via ORAL
  Filled 2016-12-08 (×6): qty 1

## 2016-12-08 MED ORDER — ONDANSETRON HCL 4 MG/2ML IJ SOLN: 4.0000 mg | INTRAMUSCULAR | Status: DC | PRN

## 2016-12-08 MED ORDER — SODIUM CHLORIDE 0.9 % IV SOLN
Freq: Once | INTRAVENOUS | Status: AC
  Administered 2016-12-09: 02:00:00 via INTRAVENOUS

## 2016-12-08 MED ORDER — SODIUM CHLORIDE 0.9 % IV SOLN: INTRAVENOUS | Status: DC

## 2016-12-08 MED ORDER — DEXTROSE 5 % IV SOLN
1.0000 g | Freq: Once | INTRAVENOUS | Status: DC
  Filled 2016-12-08: qty 10

## 2016-12-08 MED ORDER — ONDANSETRON HCL 4 MG/2ML IJ SOLN: 4.0000 mg | Freq: Four times a day (QID) | INTRAMUSCULAR | Status: DC | PRN

## 2016-12-08 MED ORDER — ACETAMINOPHEN 325 MG PO TABS
650.0000 mg | ORAL_TABLET | Freq: Four times a day (QID) | ORAL | Status: DC | PRN
  Administered 2016-12-09 – 2016-12-10 (×2): 650 mg via ORAL
  Filled 2016-12-08 (×2): qty 2

## 2016-12-08 MED ORDER — SODIUM CHLORIDE 0.9 % IV SOLN
10.0000 mL/h | Freq: Once | INTRAVENOUS | Status: AC
Start: 2016-12-08 — End: 2016-12-08
  Administered 2016-12-08: 10 mL/h via INTRAVENOUS

## 2016-12-08 MED ORDER — MORPHINE SULFATE (PF) 2 MG/ML IV SOLN: 2.0000 mg | INTRAVENOUS | Status: DC | PRN

## 2016-12-08 MED ORDER — ACETAMINOPHEN 650 MG RE SUPP: 650.0000 mg | Freq: Four times a day (QID) | RECTAL | Status: DC | PRN

## 2016-12-08 NOTE — ED Notes (Signed)
CRITICAL VALUE ALERT  Critical Value:  Troponin 0.04  Date & Time Notied:  12/08/2016 1632  Provider Notified: Thurnell Garbe, MD  Orders Received/Actions taken: None at this time

## 2016-12-08 NOTE — ED Notes (Signed)
Pt returned from xray

## 2016-12-08 NOTE — ED Provider Notes (Signed)
[facility] DEPT Provider Note   CSN: [id number] Arrival date & time: 12/08/16  1515     History   Chief Complaint Chief Complaint  Patient presents with  . Abdominal Pain  . Emesis    HPI [name] is a 83 y.o. female.  HPI Pt was seen at 1540.  Per pt, c/o gradual onset and persistence of constant generalized abd "pain" for the past 1+ week.  Has been associated with nausea.  Describes the abd pain as generalized, but esp LLQ. Describes her stools as "brown."  Denies vomiting/diarrhea, no fevers, no back pain, no rash, no CP/SOB, no black or blood in stools.       Past Medical History:  Diagnosis Date  . Angiodysplasia of stomach and duodenum   . AV malformation of gastrointestinal tract   . Carotid artery disease ([place])    RIght CEA 05/2004  . Chronic anticoagulation   . Chronic atrial fibrillation ([place])   . Chronic blood loss anemia   . Chronic diastolic CHF (congestive heart failure) ([place])   . Coronary atherosclerosis of native coronary artery    a. RCA stent 2003 with 60% LCx, 60% OM1, 80% OM2 disease at that time.  . Diverticulosis   . Essential hypertension, benign   . Gastric ulcer   . GI bleeding    Recurrent GIB (prior gastric ulcer/diverticulosis/AVM in 2017, gastric AVM s/p endoscopic therapy in 05/2016, recurrent GIB 06/2016 with AVM s/p therapy on endoscopy.  . Hyperlipidemia   . Lymphedema    a. in general, her LE edema appears to be a poor indicator of her total fluid status - chronic edema.  . Obesity   . Renal artery stenosis ([facility])    Bilateral renal artery stents 2004  . Tobacco abuse, in remission    40 pack year stopped in 1989    Patient Active Problem List   Diagnosis Date Noted  . Lymphedema 11/14/2016  . CKD (chronic kidney disease) stage 3, GFR 30-59 ml/min 09/05/2016  . Hypokalemia 09/05/2016  . CHF exacerbation ([place]) 08/25/2016  . Acute GI bleeding 06/17/2016  . Difficulty in walking, not elsewhere classified   .  Dizziness and giddiness   . Gastrointestinal hemorrhage associated with peptic ulcer   . Renal impairment   . Acute blood loss anemia 05/22/2016  . Chronic blood loss anemia 05/22/2016  . Chronic diastolic CHF (congestive heart failure) ([place]) 05/22/2016  . Acute renal failure superimposed on stage 3 chronic kidney disease ([place]) 05/22/2016  . Anticoagulated on Coumadin   . Symptomatic anemia 07/11/2015  . Microcytic anemia 07/11/2015  . Fatigue 07/11/2015  . Occlusion and stenosis of carotid artery without mention of cerebral infarction-Bilateral 11/11/2013  . Aftercare following surgery of the circulatory system, [place] 11/11/2013  . Varicose veins of lower extremities with other complications 77/82/4235  . Encounter for therapeutic drug monitoring 05/18/2013  . Fasting hyperglycemia 07/03/2012  . Chronic anticoagulation   . Tobacco abuse, in remission   . Carotid artery occlusion   . Hypertension   . Obesity   . Hyperlipidemia   . Chronic venous insufficiency 07/26/2010  . Chronic atrial fibrillation ([place]) 04/05/2010  . Coronary atherosclerosis of native coronary artery 04/05/2010  . PERIPHERAL VASCULAR DISEASE 04/05/2010    Past Surgical History:  Procedure Laterality Date  . APPENDECTOMY    . CAROTID ENDARTERECTOMY     Right in 2005, left in 2010  . COLONOSCOPY  Never   Declines  . COLONOSCOPY N/A 08/19/2015   Procedure:  COLONOSCOPY;  Surgeon: Rogene Houston, MD;  Location: [facility];  Service: Endoscopy;  Laterality: N/A;  7:30  . CORONARY ANGIOPLASTY    . ESOPHAGOGASTRODUODENOSCOPY N/A 08/19/2015   Procedure: ESOPHAGOGASTRODUODENOSCOPY (EGD);  Surgeon: Rogene Houston, MD;  Location: [facility];  Service: Endoscopy;  Laterality: N/A;  . ESOPHAGOGASTRODUODENOSCOPY N/A 05/24/2016   Procedure: ESOPHAGOGASTRODUODENOSCOPY (EGD);  Surgeon: Rogene Houston, MD;  Location: [facility];  Service: Endoscopy;  Laterality: N/A;  . ESOPHAGOGASTRODUODENOSCOPY N/A 06/19/2016    Procedure: ESOPHAGOGASTRODUODENOSCOPY (EGD);  Surgeon: Rogene Houston, MD;  Location: [facility];  Service: Endoscopy;  Laterality: N/A;  . ESOPHAGOGASTRODUODENOSCOPY N/A 09/05/2016   Procedure: ESOPHAGOGASTRODUODENOSCOPY (EGD);  Surgeon: Rogene Houston, MD;  [facility];  Service: Endoscopy;  Laterality: N/A;  . GIVENS CAPSULE STUDY N/A 06/20/2016   Procedure: GIVENS CAPSULE STUDY;  Surgeon: Rogene Houston, MD;  Location: [facility];  Service: Endoscopy;  Laterality: N/A;    OB History    Gravida Para Term Preterm AB Living             4   SAB TAB Ectopic Multiple Live Births                   Home Medications    Prior to Admission medications   Medication Sig Start Date End Date Taking? Authorizing Provider  acetaminophen (TYLENOL) 500 MG tablet Take 500 mg by mouth every 6 (six) hours as needed for headache.    [provider]  atorvastatin (LIPITOR) 80 MG tablet TAKE 1 TABLET EVERY NIGHT AT BEDTIME 05/07/16   Satira Sark, MD  bisoprolol (ZEBETA) 5 MG tablet take 1 tablet by mouth twice a day 12/06/16   Satira Sark, MD  metolazone (ZAROXOLYN) 5 MG tablet Take 1 tablet (5 mg total) by mouth 2 (two) times a week. 11/15/16 02/13/17  Imogene Burn, PA-C  pantoprazole (PROTONIX) 40 MG tablet Take 1 tablet (40 mg total) by mouth 2 (two) times daily before a meal. 10/12/16   Setzer, Rona Ravens, NP  Pediatric Multivitamins-Iron (FLINTSTONES PLUS IRON PO) Take 1 tablet by mouth 2 (two) times daily.     [provider]  potassium chloride SA (K-DUR,KLOR-CON) 20 MEQ tablet Take 40 meq (2 tablets) in the am , and take 20 meq (1 tablet) in the pm 11/01/16   Satira Sark, MD  torsemide (DEMADEX) 20 MG tablet Take 4 tablets (80 mg total) by mouth 2 (two) times daily. 10/24/16   Satira Sark, MD  warfarin (COUMADIN) 5 MG tablet Take 1 tablet daily except 1/2 tablet on Tuesdays and Saturdays or as directed by [facility] Patient taking  differently: Take 2.5-5 mg by mouth daily. Take 1 tablet daily except 1/2 tablet on Tuesdays and Saturdays or as directed by [facility] 07/19/16   Satira Sark, MD    Family History Family History  Problem Relation Age of Onset  . Lung cancer Mother   . Cancer Mother   . Hypertension Daughter   . Hyperlipidemia Daughter   . Hypertension Son   . Hypertension Daughter   . Heart disease Maternal Grandmother     Social History Social History  Substance Use Topics  . Smoking status: Former Smoker    Types: Cigarettes    Quit date: 04/30/1987  . Smokeless tobacco: Never Used  . Alcohol use No     Allergies   Diltiazem and Verapamil   Review of  Systems Review of Systems ROS: Statement: All systems negative except as marked or noted in the HPI; Constitutional: Negative for fever and chills. ; ; Eyes: Negative for eye pain, redness and discharge. ; ; ENMT: Negative for ear pain, hoarseness, nasal congestion, sinus pressure and sore throat. ; ; Cardiovascular: Negative for chest pain, palpitations, diaphoresis, dyspnea and peripheral edema. ; ; Respiratory: Negative for cough, wheezing and stridor. ; ; Gastrointestinal: +nausea, abd pain. Negative for vomiting, diarrhea, blood in stool, hematemesis, jaundice and rectal bleeding. . ; ; Genitourinary: Negative for dysuria, flank pain and hematuria. ; ; Musculoskeletal: Negative for back pain and neck pain. Negative for swelling and trauma.; ; Skin: Negative for pruritus, rash, abrasions, blisters, bruising and skin lesion.; ; Neuro: Negative for headache, lightheadedness and neck stiffness. Negative for weakness, altered level of consciousness, altered mental status, extremity weakness, paresthesias, involuntary movement, seizure and syncope.      Physical Exam Updated Vital Signs BP (!) 118/59 (BP Location: Right Arm)   Pulse 70   Temp 97.8 F (36.6 C) (Oral)   Resp 18   Ht 5\' 1"  (1.549 m)   Wt 73 kg (161 lb)   SpO2 100%    BMI 30.42 kg/m   Physical Exam 1545: Physical examination:  Nursing notes reviewed; Vital signs and O2 SAT reviewed;  Constitutional: Well developed, Well nourished, Well hydrated, In no acute distress; Head:  Normocephalic, atraumatic; Eyes: EOMI, PERRL, No scleral icterus; ENMT: Mouth and pharynx normal, Mucous membranes moist; Neck: Supple, Full range of motion, No lymphadenopathy; Cardiovascular: Irregular rate and rhythm, No gallop; Respiratory: Breath sounds clear & equal bilaterally, No wheezes.  Speaking full sentences with ease, Normal respiratory effort/excursion; Chest: Nontender, Movement normal; Abdomen: Soft, +LLQ > diffuse tenderness to palp. Nondistended, Normal bowel sounds. Rectal exam performed w/permission of pt and ED RN chaperone present.  Anal tone normal.  Non-tender, soft brown stool in rectal vault, heme positive.  No fissures, no external hemorrhoids, no palp masses.; Genitourinary: No CVA tenderness; Extremities: Pulses normal, No tenderness, No edema, No calf edema or asymmetry.; Neuro: AA&Ox3, Major CN grossly intact.  Speech clear. No gross focal motor or sensory deficits in extremities.; Skin: Color normal, Warm, Dry.   ED Treatments / Results  Labs (all labs ordered are listed, but only abnormal results are displayed)   EKG  EKG Interpretation None       Radiology   Procedures Procedures (including critical care time)  Medications Ordered in ED Medications  ondansetron (ZOFRAN) injection 4 mg (not administered)     Initial Impression / Assessment and Plan / ED Course  I have reviewed the triage vital signs and the nursing notes.  Pertinent labs & imaging results that were available during my care of the patient were reviewed by me and considered in my medical decision making (see chart for details).  MDM Reviewed: previous chart, nursing note and vitals Reviewed previous: labs and ECG Interpretation: labs, ECG, x-ray and CT scan Total time  providing critical care: 30-74 minutes. This excludes time spent performing separately reportable procedures and services. Consults: admitting MD   CRITICAL CARE Performed by: Alfonzo Feller Total critical care time: 35 minutes Critical care time was exclusive of separately billable procedures and treating other patients. Critical care was necessary to treat or prevent imminent or life-threatening deterioration. Critical care was time spent personally by me on the following activities: development of treatment plan with patient and/or surrogate as well as nursing, discussions with consultants, evaluation of patient's  response to treatment, examination of patient, obtaining history from patient or surrogate, ordering and performing treatments and interventions, ordering and review of laboratory studies, ordering and review of radiographic studies, pulse oximetry and re-evaluation of patient's condition.   ED ECG REPORT   Date: 12/08/2016  Rate: 99  Rhythm: atrial fibrillation  QRS Axis: normal  Intervals: normal  ST/T Wave abnormalities: nonspecific ST/T changes, diffuse  Conduction Disutrbances:none  Narrative Interpretation:   Old EKG Reviewed: unchanged; no significant changes from previous EKG dated 05/22/2016.  I have personally reviewed the EKG tracing and agree with the computerized printout as noted.   Results for orders placed or performed during the hospital encounter of 12/08/16  Comprehensive metabolic panel  Result Value Ref Range   Sodium 133 (L) 135 - 145 mmol/L   Potassium 3.3 (L) 3.5 - 5.1 mmol/L   Chloride 91 (L) 101 - 111 mmol/L   CO2 28 22 - 32 mmol/L   Glucose, Bld 186 (H) 65 - 99 mg/dL   BUN 49 (H) 6 - 20 mg/dL   Creatinine, Ser 1.61 (H) 0.44 - 1.00 mg/dL   Calcium 8.8 (L) 8.9 - 10.3 mg/dL   Total Protein 7.2 6.5 - 8.1 g/dL   Albumin 3.3 (L) 3.5 - 5.0 g/dL   AST 38 15 - 41 U/L   ALT 18 14 - 54 U/L   Alkaline Phosphatase 85 38 - 126 U/L   Total  Bilirubin 1.0 0.3 - 1.2 mg/dL   GFR calc non Af Amer 29 (L) >60 mL/min   GFR calc Af Amer 33 (L) >60 mL/min   Anion gap 14 5 - 15  Troponin I  Result Value Ref Range   Troponin I 0.04 (HH) <0.03 ng/mL  Lipase, blood  Result Value Ref Range   Lipase 96 (H) 11 - 51 U/L  CBC with Differential  Result Value Ref Range   WBC 9.5 4.0 - 10.5 K/uL   RBC 2.86 (L) 3.87 - 5.11 MIL/uL   Hemoglobin 6.9 (LL) 12.0 - 15.0 g/dL   HCT 22.7 (L) 36.0 - 46.0 %   MCV 79.4 78.0 - 100.0 fL   MCH 24.1 (L) 26.0 - 34.0 pg   MCHC 30.4 30.0 - 36.0 g/dL   RDW 16.0 (H) 11.5 - 15.5 %   Platelets 255 150 - 400 K/uL   Neutrophils Relative % 80 %   Neutro Abs 7.6 1.7 - 7.7 K/uL   Lymphocytes Relative 14 %   Lymphs Abs 1.3 0.7 - 4.0 K/uL   Monocytes Relative 6 %   Monocytes Absolute 0.6 0.1 - 1.0 K/uL   Eosinophils Relative 0 %   Eosinophils Absolute 0.0 0.0 - 0.7 K/uL   Basophils Relative 0 %   Basophils Absolute 0.0 0.0 - 0.1 K/uL  Urinalysis, Routine w reflex microscopic  Result Value Ref Range   Color, Urine YELLOW YELLOW   APPearance HAZY (A) CLEAR   Specific Gravity, Urine 1.008 1.005 - 1.030   pH 7.0 5.0 - 8.0   Glucose, UA NEGATIVE NEGATIVE mg/dL   Hgb urine dipstick NEGATIVE NEGATIVE   Bilirubin Urine NEGATIVE NEGATIVE   Ketones, ur NEGATIVE NEGATIVE mg/dL   Protein, ur NEGATIVE NEGATIVE mg/dL   Nitrite NEGATIVE NEGATIVE   Leukocytes, UA LARGE (A) NEGATIVE   RBC / HPF 0-5 0 - 5 RBC/hpf   WBC, UA TOO NUMEROUS TO COUNT 0 - 5 WBC/hpf   Bacteria, UA RARE (A) NONE SEEN   Squamous Epithelial / LPF 0-5 (A)  NONE SEEN  Protime-INR  Result Value Ref Range   Prothrombin Time 32.0 (H) 11.4 - 15.2 seconds   INR 3.02   Type and screen [facility]  Result Value Ref Range   ABO/RH(D) O POS    Antibody Screen NEG    Sample Expiration 12/11/2016     Ct Abdomen Pelvis Wo Contrast Result Date: 12/08/2016 CLINICAL DATA:  Intermittent abdominal pain progressively worsening over the past week.  Nausea and vomiting. EXAM: CT ABDOMEN AND PELVIS WITHOUT CONTRAST TECHNIQUE: Multidetector CT imaging of the abdomen and pelvis was performed following the standard protocol without IV contrast. COMPARISON:  Abdomen radiographs from 09/04/2016 FINDINGS: Lower chest: Cardiomegaly with aortic atherosclerosis and 3 vessel coronary arteriosclerosis is noted. No pericardial effusion. Visualization of the ventricular septum can be seen in anemia. No acute pulmonary disease. Hepatobiliary: Patchy ill-defined hypodense appearance of the liver may reflect areas of fatty infiltration. No definite space-occupying mass is identified on given the limitations of an unenhanced study. No biliary dilatation is identified. The gallbladder is nondistended without calculus. Gallbladder wall is borderline thickened but this could be due to underdistention. Mild inflammation is not entirely excluded. Nonspecific minimal perihepatic fatty induration is seen overlying the right hepatic lobe. Pancreas: Unremarkable. No pancreatic ductal dilatation or surrounding inflammatory changes. Spleen: Normal in size without focal abnormality. Adrenals/Urinary Tract: 3 mm nonobstructing calculus in the right lower pole. No hydroureteronephrosis. No focal mass identified on this unenhanced study. The urinary bladder is nondistended and unremarkable in appearance. Stomach/Bowel: Contracted appearance of the stomach. Normal small bowel rotation. No bowel obstruction or inflammation. Scattered colonic diverticulosis. Status post appendectomy. Vascular/Lymphatic: Moderate aortoiliac and branch vessel atherosclerosis without aneurysm. Bilateral renal artery stents are noted. No lymphadenopathy. Reproductive: Uterus and bilateral adnexa are unremarkable. Other: Tiny fat containing umbilical hernia. Mild dependent subcutaneous soft tissue edema along the lower lumbar spine and sacrum. Musculoskeletal: Chronic mild-to-moderate L2 compression of the superior  endplate. No retropulsion. IMPRESSION: 1. Cardiomegaly with aortic atherosclerosis and 3 vessel coronary arteriosclerosis. 2. Fatty infiltration of the liver with slight gallbladder wall thickening likely due to underdistention. No gallstones are noted. 3. Tiny focus of perihepatic mesenteric edema overlying the right hepatic lobe, nonspecific possibly related to recent unspecified inflammatory change. Colonic diverticulosis is currently noted without acute diverticulitis. 4. No acute bowel obstruction or inflammation. 5. 3 mm nonobstructing right renal calculus in the lower pole. 6. Chronic mild to moderate superior endplate compression of L2 without retropulsion. Electronically Signed   By: Ashley Royalty M.D.   On: 12/08/2016 17:13   Dg Chest 2 View Result Date: 12/08/2016 CLINICAL DATA:  Abdominal pain x1 week, nausea EXAM: CHEST  2 VIEW COMPARISON:  09/04/2016 FINDINGS: Lungs are essentially clear. Possible mild right basilar atelectasis. No pleural effusion or pneumothorax. The heart is top-normal in size. Mild degenerative changes of the visualized thoracolumbar spine. IMPRESSION: No evidence of acute cardiopulmonary disease. Electronically Signed   By: Julian Hy M.D.   On: 12/08/2016 16:37     Results for MEL, TADROS (MRN [id number]) as of 12/08/2016 16:41  Ref. Range 10/31/2016 10:19 11/14/2016 15:51 12/03/2016 09:30 12/08/2016 15:39  BUN Latest Ref Range: 6 - 20 mg/dL 33 (H) 34 (H) 44 (H) 49 (H)  Creatinine Latest Ref Range: 0.44 - 1.00 mg/dL 1.08 (H) 1.29 (H) 1.36 (H) 1.61 (H)   Results for RAIVYN, KABLER (MRN [id number]) as of 12/08/2016 16:41  Ref. Range 09/05/2016 04:04 09/05/2016 08:25 09/05/2016 14:38 09/06/2016 04:19 12/08/2016 15:39  Hemoglobin  Latest Ref Range: 12.0 - 15.0 g/dL 8.4 (L) 8.7 (L) 8.8 (L) 8.3 (L) 6.9 (LL)  HCT Latest Ref Range: 36.0 - 46.0 % 27.4 (L) 27.7 (L) 28.4 (L) 27.0 (L) 22.7 (L)    1815:  H/H lower than previous, stool heme positive (pt with known GI  AVMs). Will transfuse PRBC's. Will continue judicious IVF for elevated BUN/Cr. IV protonix given.  +UTI, UC pending; will dose IV rocephin. Dx and testing d/w pt and family.  Questions answered.  Verb understanding, agreeable to admit. T/C to [facility]. Lorin Mercy, case discussed, including:  HPI, pertinent PM/SHx, VS/PE, dx testing, ED course and treatment:  Agreeable to admit.     Final Clinical Impressions(s) / ED Diagnoses   Final diagnoses:  None    New Prescriptions New Prescriptions   No medications on file     Francine Graven, DO 12/09/16 2255

## 2016-12-08 NOTE — ED Triage Notes (Signed)
Pt reports abd pain intermittent in nature and progressively worsening x 1 week.  Nausea and vomiting as well.

## 2016-12-08 NOTE — ED Notes (Signed)
CRITICAL VALUE ALERT  Critical Value:  Hgb 6.9  Date & Time Notied:  1610  Provider Notified: Thurnell Garbe, MD

## 2016-12-08 NOTE — ED Notes (Signed)
Attempted report x1. 

## 2016-12-08 NOTE — ED Notes (Signed)
Pt transported to CT ?

## 2016-12-08 NOTE — ED Notes (Signed)
Phlebotomy at bedside.

## 2016-12-08 NOTE — ED Notes (Signed)
Report given to Angie, RN.

## 2016-12-08 NOTE — H&P (Addendum)
History and Physical    [name]:[id number] DOB: 04-May-1932 DOA: 12/08/2016  PCP: [name], MD Consultants:  [name] - cardiology; Rehman - GI; Scot Dock - vascular Patient coming from: Home - lives alone; NOK: daughter, [phone]  Chief Complaint: abdominal pain  HPI: [name] is a 83 y.o. female with medical history significant of significant varicosities; AVMs and angiodysplasia of GI tract causing bleeds; HTN; Diastolic CHF; PVD: and afib on Coumadin presenting because "I got sick" with abdominal pain.  Pain is all over the lower abdomen.  Nausea x 3-7 days.  Started with abodminal pain and anorexia on Tuesday.  Pain was there intermittently but present daily.  Feels very sluggish in the mornings.  No SOB.  Pain was in B LQ.  She has not vomited at all.  No blood in stools. Last BM was this AM and was normal.  No urinary symptoms.  She had prior episodes of bleeding 3-4 times.  She has been thought to have ulcers.  EGD and colonoscopy both have been abnormal in the past with cauterization multiple times.  During the last hospitalization she was passing large clots.  Patient takes Coumadin for afib.   ED Course: Worse anemia, heme positive, transfuse PRBCs.  Judicious IVFs for elevated BUN/Creatinine.  IV Protonix.  Abnormal UA - ordered Rocephin.  Review of Systems: As per HPI; otherwise review of systems reviewed and negative.   Ambulatory Status:  Ambulates without assistance  Past Medical History:  Diagnosis Date  . Angiodysplasia of stomach and duodenum   . AV malformation of gastrointestinal tract   . Carotid artery disease ([place])    RIght CEA 05/2004  . Chronic anticoagulation   . Chronic atrial fibrillation ([place])   . Chronic blood loss anemia   . Chronic diastolic CHF (congestive heart failure) ([place])   . Coronary atherosclerosis of native coronary artery    a. RCA stent 2003 with 60% LCx, 60% OM1, 80% OM2 disease at that time.  . Diverticulosis    . Essential hypertension, benign   . Gastric ulcer   . GI bleeding    Recurrent GIB (prior gastric ulcer/diverticulosis/AVM in 2017, gastric AVM s/p endoscopic therapy in 05/2016, recurrent GIB 06/2016 with AVM s/p therapy on endoscopy.  . Hyperlipidemia   . Lymphedema    a. in general, her LE edema appears to be a poor indicator of her total fluid status - chronic edema.  . Obesity   . Renal artery stenosis ([facility])    Bilateral renal artery stents 2004  . Tobacco abuse, in remission    40 pack year stopped in 1989  . Varicosities of leg     Past Surgical History:  Procedure Laterality Date  . APPENDECTOMY    . CAROTID ENDARTERECTOMY     Right in 2005, left in 2010  . COLONOSCOPY  Never   Declines  . COLONOSCOPY N/A 08/19/2015   Procedure: COLONOSCOPY;  Surgeon: Rogene Houston, MD;  [facility];  Service: Endoscopy;  Laterality: N/A;  7:30  . CORONARY ANGIOPLASTY    . ESOPHAGOGASTRODUODENOSCOPY N/A 08/19/2015   Procedure: ESOPHAGOGASTRODUODENOSCOPY (EGD);  Surgeon: Rogene Houston, MD;  [facility];  Service: Endoscopy;  Laterality: N/A;  . ESOPHAGOGASTRODUODENOSCOPY N/A 05/24/2016   Procedure: ESOPHAGOGASTRODUODENOSCOPY (EGD);  Surgeon: Rogene Houston, MD;  [facility];  Service: Endoscopy;  Laterality: N/A;  . ESOPHAGOGASTRODUODENOSCOPY N/A 06/19/2016   Procedure: ESOPHAGOGASTRODUODENOSCOPY (EGD);  Surgeon: Rogene Houston, MD;  [facility];  Service: Endoscopy;  Laterality: N/A;  . ESOPHAGOGASTRODUODENOSCOPY N/A 09/05/2016   Procedure: ESOPHAGOGASTRODUODENOSCOPY (EGD);  Surgeon: Rogene Houston, MD;  [facility];  Service: Endoscopy;  Laterality: N/A;  . GIVENS CAPSULE STUDY N/A 06/20/2016   Procedure: GIVENS CAPSULE STUDY;  Surgeon: Rogene Houston, MD;  [facility];  Service: Endoscopy;  Laterality: N/A;    Social History   Social History  . Marital status: Widowed    Spouse name: N/A  . Number of children:  4  . Years of education: N/A   Occupational History  . housewife Retired   Social History Main Topics  . Smoking status: Former Smoker    Types: Cigarettes    Quit date: 04/30/1987  . Smokeless tobacco: Never Used  . Alcohol use No  . Drug use: No  . Sexual activity: Not on file   Other Topics Concern  . Not on file   Social History Narrative  . No narrative on file    Allergies  Allergen Reactions  . Diltiazem Other (See Comments)    Edema  . Verapamil Other (See Comments)    Diarrhea    Family History  Problem Relation Age of Onset  . Lung cancer Mother   . Cancer Mother   . Hypertension Daughter   . Hyperlipidemia Daughter   . Hypertension Son   . Hypertension Daughter   . Heart disease Maternal Grandmother     Prior to Admission medications   Medication Sig Start Date End Date Taking? Authorizing Provider  acetaminophen (TYLENOL) 500 MG tablet Take 500 mg by mouth every 6 (six) hours as needed for headache.   Yes [provider]  atorvastatin (LIPITOR) 80 MG tablet TAKE 1 TABLET EVERY NIGHT AT BEDTIME 05/07/16  Yes Satira Sark, MD  bisoprolol (ZEBETA) 5 MG tablet take 1 tablet by mouth twice a day 12/06/16  Yes Satira Sark, MD  metolazone (ZAROXOLYN) 5 MG tablet Take 1 tablet (5 mg total) by mouth 2 (two) times a week. Patient taking differently: Take 5 mg by mouth every Wednesday.  11/15/16 02/13/17 Yes Imogene Burn, PA-C  pantoprazole (PROTONIX) 40 MG tablet Take 1 tablet (40 mg total) by mouth 2 (two) times daily before a meal. 10/12/16  Yes Setzer, Rona Ravens, NP  Pediatric Multivitamins-Iron (FLINTSTONES PLUS IRON PO) Take 1 tablet by mouth 2 (two) times daily.    Yes [provider]  potassium chloride SA (K-DUR,KLOR-CON) 20 MEQ tablet Take 40 meq (2 tablets) in the am , and take 20 meq (1 tablet) in the pm Patient taking differently: Take 20-40 mEq by mouth See admin instructions. Take 40 meq (2 tablets) in the am , and take 20  meq (1 tablet) in the pm 11/01/16  Yes Satira Sark, MD  torsemide (DEMADEX) 20 MG tablet Take 4 tablets (80 mg total) by mouth 2 (two) times daily. 10/24/16  Yes Satira Sark, MD  warfarin (COUMADIN) 5 MG tablet Take 1 tablet daily except 1/2 tablet on Tuesdays and Saturdays or as directed by [facility] Patient taking differently: Take 2.5-5 mg by mouth daily. Takes 2.5mg  on Thursdays only 07/19/16  Yes Satira Sark, MD    Physical Exam: Vitals:   12/08/16 1830 12/08/16 1937 12/08/16 2100 12/08/16 2115  BP: 129/70 (!) 126/47 108/65 (!) 114/54  Pulse: (!) 150 72 94 95  Resp: 20 18 18 18   Temp:  98.3 F (36.8 C) 98.2 F (36.8  C) 98.4 F (36.9 C)  TempSrc:  Oral Oral Oral  SpO2: 94% 97% 97% 100%  Weight:  73.4 kg (161 lb 13.1 oz)    Height:  5\' 1"  (1.549 m)       General: Appears calm and comfortable and is NAD Eyes:  PERRL, EOMI, normal lids, iris ENT:  grossly normal hearing, lips & tongue, mmm; appropriate dentition Neck:  no LAD, masses or thyromegaly; no carotid bruits Cardiovascular:  RRR, no m/r/g. Respiratory:   CTA bilaterally with no wheezes/rales/rhonchi.  Normal respiratory effort. Abdomen:  soft, diffusely tender, particularly in mid-epigastric region and B LQs, ND, NABS Skin: She has very severe varicosities with chronic lymphedema of the B LE Musculoskeletal:  grossly normal tone BUE/BLE, good ROM, no bony abnormality Psychiatric:  grossly normal mood and affect, speech fluent and appropriate, AOx3 Neurologic:  CN 2-12 grossly intact, moves all extremities in coordinated fashion, sensation intact    Radiological Exams on Admission: Ct Abdomen Pelvis Wo Contrast  Result Date: 12/08/2016 CLINICAL DATA:  Intermittent abdominal pain progressively worsening over the past week. Nausea and vomiting. EXAM: CT ABDOMEN AND PELVIS WITHOUT CONTRAST TECHNIQUE: Multidetector CT imaging of the abdomen and pelvis was performed following the standard protocol  without IV contrast. COMPARISON:  Abdomen radiographs from 09/04/2016 FINDINGS: Lower chest: Cardiomegaly with aortic atherosclerosis and 3 vessel coronary arteriosclerosis is noted. No pericardial effusion. Visualization of the ventricular septum can be seen in anemia. No acute pulmonary disease. Hepatobiliary: Patchy ill-defined hypodense appearance of the liver may reflect areas of fatty infiltration. No definite space-occupying mass is identified on given the limitations of an unenhanced study. No biliary dilatation is identified. The gallbladder is nondistended without calculus. Gallbladder wall is borderline thickened but this could be due to underdistention. Mild inflammation is not entirely excluded. Nonspecific minimal perihepatic fatty induration is seen overlying the right hepatic lobe. Pancreas: Unremarkable. No pancreatic ductal dilatation or surrounding inflammatory changes. Spleen: Normal in size without focal abnormality. Adrenals/Urinary Tract: 3 mm nonobstructing calculus in the right lower pole. No hydroureteronephrosis. No focal mass identified on this unenhanced study. The urinary bladder is nondistended and unremarkable in appearance. Stomach/Bowel: Contracted appearance of the stomach. Normal small bowel rotation. No bowel obstruction or inflammation. Scattered colonic diverticulosis. Status post appendectomy. Vascular/Lymphatic: Moderate aortoiliac and branch vessel atherosclerosis without aneurysm. Bilateral renal artery stents are noted. No lymphadenopathy. Reproductive: Uterus and bilateral adnexa are unremarkable. Other: Tiny fat containing umbilical hernia. Mild dependent subcutaneous soft tissue edema along the lower lumbar spine and sacrum. Musculoskeletal: Chronic mild-to-moderate L2 compression of the superior endplate. No retropulsion. IMPRESSION: 1. Cardiomegaly with aortic atherosclerosis and 3 vessel coronary arteriosclerosis. 2. Fatty infiltration of the liver with slight  gallbladder wall thickening likely due to underdistention. No gallstones are noted. 3. Tiny focus of perihepatic mesenteric edema overlying the right hepatic lobe, nonspecific possibly related to recent unspecified inflammatory change. Colonic diverticulosis is currently noted without acute diverticulitis. 4. No acute bowel obstruction or inflammation. 5. 3 mm nonobstructing right renal calculus in the lower pole. 6. Chronic mild to moderate superior endplate compression of L2 without retropulsion. Electronically Signed   By: Ashley Royalty M.D.   On: 12/08/2016 17:13   Dg Chest 2 View  Result Date: 12/08/2016 CLINICAL DATA:  Abdominal pain x1 week, nausea EXAM: CHEST  2 VIEW COMPARISON:  09/04/2016 FINDINGS: Lungs are essentially clear. Possible mild right basilar atelectasis. No pleural effusion or pneumothorax. The heart is top-normal in size. Mild degenerative changes of the visualized thoracolumbar  spine. IMPRESSION: No evidence of acute cardiopulmonary disease. Electronically Signed   By: Julian Hy M.D.   On: 12/08/2016 16:37    EKG: Independently reviewed.  Afib with rate 99; PVC, significant nonspecific ST changes with no evidence of acute ischemia, NSCSLT   Labs on Admission: I have personally reviewed the available labs and imaging studies at the time of the admission.  Pertinent labs:   Heme positive INR 3.02 Glucose 186 BUN 49/Creatinine 1.61/GFR 29; prior 44/1.36 on 8/20, 33/1.08 on 7/18 Lipase 96 Troponin 0.04 Hgb 6.9; 8.3 on 5/24 UA: large LE, TNTC WBC, rare bacteria   Assessment/Plan Principal Problem:   Symptomatic anemia Active Problems:   Chronic atrial fibrillation (HCC)   Hypertension   Varicose veins of bilateral lower extremities with other complications   Anticoagulated on Coumadin   Acute renal failure superimposed on stage 3 chronic kidney disease (H[facility])   GI bleed   Asymptomatic bacteriuria   Symptomatic anemia -Patient with prior h/o GI bleeding  requiring transfusions -Presenting with weakness, nausea, and abdominal pain -Found to have acute on chronic anemia -Patient consented and transfusion initiated in ER -Will transfuse 2 units of PRBC and recheck CBC after -Since the patient is on Coumadin, her Hgb goal may be higher than 7-8  GI bleed -Patient's fatigue, abdominal pain, and nausea are most likely caused by anemia secondary to upper GI bleeding.  -Patient has history of ulcers as well as AVMs and angiodysplasia -Given the severe varicosities she manifests on her legs, the concern for significant internal vascular abnormalities is high. - will admit  - GI consulted for tomorrow AM, will follow up recommendations - Clear liquids for now - while EGD is likely indicated and possibly also colonoscopy, there is no urgency and the procedure may be delayed until Monday - NS at 50 mL/hr starting after the transfusion - Start IV pantoprazole 40 mg bid - Zofran IV for nausea - Avoid NSAIDs and SQ heparin - Maintain IV access (2 large bore IVs if possible).  Afib on Coumadin -Heart rate is well controlled. -CHA2DS2-VASc Score is 5 with an estimated stroke rate of 6.7% per year -In the past, the patient has clearly stated that she would prefer to take her chances of severe bleeding over chance of having a stroke -This is at least her 3rd GI bleed this year and so there is the question of whether the Coumadin continues to be worth the risk; encourage ongoing discussion -INR is slightly increased about goal of 2-3; at a minimum, consider decreasing INR goal to 2 (1.8-2.2); will hold Coumadin for now and check daily INR.  Acute renal failure on CKD -Likely related to ongoing blood loss and volume depletion -Judiciously replace  Asymptomatic bacteriuria -Patient without any symptoms despite mildly abnormal UA -Antibiotics do not appear to be indicated at this time -I have discussed this with the family and they are in agreement  DVT  prophylaxis: Foot flex - due to GI bleeding and varicosities Code Status:  DNR - confirmed with patient/family Family Communication: Daughters present throughout evaluation  Disposition Plan:  Home once clinically improved Consults called: GI  Admission status: Admit - It is my clinical opinion that admission to INPATIENT is reasonable and necessary because this patient will require at least 2 midnights in the hospital to treat this condition based on the medical complexity of the problems presented.  Given the aforementioned information, the predictability of an adverse outcome is felt to be significant.  Karmen Bongo MD [facility]  If note is complete, please contact covering daytime or nighttime physician. [email or web address] Password [facility]  12/08/2016, 10:13 PM

## 2016-12-09 ENCOUNTER — Encounter (HOSPITAL_COMMUNITY): Payer: Self-pay | Admitting: Gastroenterology

## 2016-12-09 DIAGNOSIS — R8271 Bacteriuria: Secondary | ICD-10-CM

## 2016-12-09 DIAGNOSIS — I482 Chronic atrial fibrillation: Secondary | ICD-10-CM

## 2016-12-09 DIAGNOSIS — N179 Acute kidney failure, unspecified: Secondary | ICD-10-CM

## 2016-12-09 DIAGNOSIS — N390 Urinary tract infection, site not specified: Secondary | ICD-10-CM

## 2016-12-09 DIAGNOSIS — N183 Chronic kidney disease, stage 3 (moderate): Secondary | ICD-10-CM

## 2016-12-09 DIAGNOSIS — K922 Gastrointestinal hemorrhage, unspecified: Secondary | ICD-10-CM

## 2016-12-09 DIAGNOSIS — D649 Anemia, unspecified: Secondary | ICD-10-CM

## 2016-12-09 LAB — BASIC METABOLIC PANEL
Anion gap: 10 (ref 5–15)
BUN: 38 mg/dL — AB (ref 6–20)
CHLORIDE: 94 mmol/L — AB (ref 101–111)
CO2: 31 mmol/L (ref 22–32)
Calcium: 9 mg/dL (ref 8.9–10.3)
Creatinine, Ser: 1.27 mg/dL — ABNORMAL HIGH (ref 0.44–1.00)
GFR calc Af Amer: 44 mL/min — ABNORMAL LOW (ref >60)
GFR, EST NON AFRICAN AMERICAN: 38 mL/min — AB (ref >60)
Glucose, Bld: 112 mg/dL — ABNORMAL HIGH (ref 65–99)
POTASSIUM: 3.3 mmol/L — AB (ref 3.5–5.1)
SODIUM: 135 mmol/L (ref 135–145)

## 2016-12-09 LAB — CBC
HEMATOCRIT: 28.1 % — AB (ref 36.0–46.0)
Hemoglobin: 9 g/dL — ABNORMAL LOW (ref 12.0–15.0)
MCH: 25.4 pg — ABNORMAL LOW (ref 26.0–34.0)
MCHC: 32 g/dL (ref 30.0–36.0)
MCV: 79.2 fL (ref 78.0–100.0)
Platelets: 202 10*3/uL (ref 150–400)
RBC: 3.55 MIL/uL — AB (ref 3.87–5.11)
RDW: 15.5 % (ref 11.5–15.5)
WBC: 9.5 10*3/uL (ref 4.0–10.5)

## 2016-12-09 LAB — PROTIME-INR
INR: 2.46
Prothrombin Time: 27.1 seconds — ABNORMAL HIGH (ref 11.4–15.2)

## 2016-12-09 LAB — TROPONIN I: TROPONIN I: 0.06 ng/mL — AB (ref <0.03)

## 2016-12-09 MED ORDER — AMOXICILLIN 250 MG PO CAPS
500.0000 mg | ORAL_CAPSULE | Freq: Three times a day (TID) | ORAL | Status: DC
  Administered 2016-12-10 – 2016-12-11 (×4): 500 mg via ORAL
  Filled 2016-12-09 (×4): qty 2

## 2016-12-09 NOTE — Progress Notes (Signed)
Per Roselind Messier, RN and Juanell Fairly, RN and report from ED nurse on 12/08/2016, patient received Rocephin in the ED prior to arriving to room 313.

## 2016-12-09 NOTE — Progress Notes (Signed)
PROGRESS NOTE    [name]  HUT:[id number] DOB: 09-Feb-1933 DOA: 12/08/2016 PCP: [name], MD    Brief Narrative:  83 year old female with a history of AVMs, angiodysplasia GI tract, diastolic CHF, atrial fibrillation on Coumadin, presented to hospital with abdominal pain, weakness and nausea. Found to have low hemoglobin and concerns for GI bleeding. She was transfused PRBCs after admission to the hospital and GI was consulted. Coumadin has been held, although INR was supratherapeutic on admission. She is also found to have a urinary tract infection and put on antibiotics.   Assessment & Plan:   Principal Problem:   Symptomatic anemia Active Problems:   Chronic atrial fibrillation (HCC)   Hypertension   Varicose veins of bilateral lower extremities with other complications   Anticoagulated on Coumadin   Acute renal failure superimposed on stage 3 chronic kidney disease (HCC)   GI bleed   Asymptomatic bacteriuria   Urinary tract infection without hematuria   1. Symptomatic anemia. Patient presented with weakness, nausea . and have low hemoglobin and was transfused 2 units of PRBCs. Follow-up hemoglobin has improved. Recheck in a.m . 2. Urinary tract infection. Patient presented with abdominal pain. It is felt that her discomfort may be related to UTI. She received 1 dose of Rocephin. Started on oral amoxicillin. Check urine culture 3. GI bleeding. Patient has a history of AVMs and angiodysplasia. She has had bleeding episodes in the past. Continue on Protonix twice a day. GI following. No indication for endoscopy at this time since INR is elevated. Follow-up further recommendations. 4. Atrial fibrillation, chronic. CHADSVASc 5. Currently rate controlled. Anticoagulation currently on hold due to elevated INR and GI bleeding. 5. Acute kidney injury on chronic kidney disease stage III. Creatinine has improved with volume resuscitation. Continue to monitor.   DVT  prophylaxis: Foot pump Code Status: DO NOT RESUSCITATE Family Communication: Discussed with multiple family members at the bedside Disposition Plan: Discharge home once improved   Consultants:   Gastroenterology  Procedures:     Antimicrobials:   Rocephin 1 dose  Amoxicillin 8/26>>   Subjective: Feeling better today. Abdominal pain is better. Overall weakness is better.  Objective: Vitals:   12/09/16 0203 12/09/16 0430 12/09/16 1201 12/09/16 1300  BP: (!) 107/40 (!) 112/41 (!) 99/57 (!) 101/59  Pulse: 75 76 65 74  Resp: 18 18  18   Temp: 97.7 F (36.5 C) 97.7 F (36.5 C)  (!) 97.5 F (36.4 C)  TempSrc: Oral Oral  Oral  SpO2: 97% 97% 96% 98%  Weight:      Height:        Intake/Output Summary (Last 24 hours) at 12/09/16 1935 Last data filed at 12/09/16 1600  Gross per 24 hour  Intake          2829.83 ml  Output                0 ml  Net          2829.83 ml   Filed Weights   12/08/16 1521 12/08/16 1937  Weight: 73 kg (161 lb) 73.4 kg (161 lb 13.1 oz)    Examination:  General exam: Appears calm and comfortable  Respiratory system: Clear to auscultation. Respiratory effort normal. Cardiovascular system: S1 & S2 heard, Irregular. No JVD, murmurs, rubs, gallops or clicks. 1-2+ pedal edema (chronic). Gastrointestinal system: Abdomen is nondistended, soft and nontender. No organomegaly or masses felt. Normal bowel sounds heard. Central nervous system: Alert and oriented. No focal neurological deficits. Extremities:  Chronic lymphedema and varicosities of lower extremities bilaterally Skin: No rashes, lesions or ulcers Psychiatry: Judgement and insight appear normal. Mood & affect appropriate.     Data Reviewed: I have personally reviewed following labs and imaging studies  CBC:  Recent Labs Lab 12/08/16 1539 12/09/16 0645  WBC 9.5 9.5  NEUTROABS 7.6  --   HGB 6.9* 9.0*  HCT 22.7* 28.1*  MCV 79.4 79.2  PLT 255 696   Basic Metabolic Panel:  Recent  Labs Lab 12/03/16 0930 12/08/16 1539 12/09/16 0645  NA 138 133* 135  K 3.5 3.3* 3.3*  CL 93* 91* 94*  CO2 35* 28 31  GLUCOSE 106* 186* 112*  BUN 44* 49* 38*  CREATININE 1.36* 1.61* 1.27*  CALCIUM 9.3 8.8* 9.0   GFR: Estimated Creatinine Clearance: 31.3 mL/min (A) (by C-G formula based on SCr of 1.27 mg/dL (H)). Liver Function Tests:  Recent Labs Lab 12/08/16 1539  AST 38  ALT 18  ALKPHOS 85  BILITOT 1.0  PROT 7.2  ALBUMIN 3.3*    Recent Labs Lab 12/08/16 1539  LIPASE 96*   No results for input(s): AMMONIA in the last 168 hours. Coagulation Profile:  Recent Labs Lab 12/03/16 0905 12/08/16 1650 12/09/16 0645  INR 2.9 3.02 2.46   Cardiac Enzymes:  Recent Labs Lab 12/08/16 1539 12/08/16 2023 12/09/16 1704  TROPONINI 0.04* 0.05* 0.06*   BNP (last 3 results) No results for input(s): PROBNP in the last 8760 hours. HbA1C: No results for input(s): HGBA1C in the last 72 hours. CBG: No results for input(s): GLUCAP in the last 168 hours. Lipid Profile: No results for input(s): CHOL, HDL, LDLCALC, TRIG, CHOLHDL, LDLDIRECT in the last 72 hours. Thyroid Function Tests: No results for input(s): TSH, T4TOTAL, FREET4, T3FREE, THYROIDAB in the last 72 hours. Anemia Panel: No results for input(s): VITAMINB12, FOLATE, FERRITIN, TIBC, IRON, RETICCTPCT in the last 72 hours. Sepsis Labs: No results for input(s): PROCALCITON, LATICACIDVEN in the last 168 hours.  No results found for this or any previous visit (from the past 240 hour(s)).       Radiology Studies: Ct Abdomen Pelvis Wo Contrast  Result Date: 12/08/2016 CLINICAL DATA:  Intermittent abdominal pain progressively worsening over the past week. Nausea and vomiting. EXAM: CT ABDOMEN AND PELVIS WITHOUT CONTRAST TECHNIQUE: Multidetector CT imaging of the abdomen and pelvis was performed following the standard protocol without IV contrast. COMPARISON:  Abdomen radiographs from 09/04/2016 FINDINGS: Lower chest:  Cardiomegaly with aortic atherosclerosis and 3 vessel coronary arteriosclerosis is noted. No pericardial effusion. Visualization of the ventricular septum can be seen in anemia. No acute pulmonary disease. Hepatobiliary: Patchy ill-defined hypodense appearance of the liver may reflect areas of fatty infiltration. No definite space-occupying mass is identified on given the limitations of an unenhanced study. No biliary dilatation is identified. The gallbladder is nondistended without calculus. Gallbladder wall is borderline thickened but this could be due to underdistention. Mild inflammation is not entirely excluded. Nonspecific minimal perihepatic fatty induration is seen overlying the right hepatic lobe. Pancreas: Unremarkable. No pancreatic ductal dilatation or surrounding inflammatory changes. Spleen: Normal in size without focal abnormality. Adrenals/Urinary Tract: 3 mm nonobstructing calculus in the right lower pole. No hydroureteronephrosis. No focal mass identified on this unenhanced study. The urinary bladder is nondistended and unremarkable in appearance. Stomach/Bowel: Contracted appearance of the stomach. Normal small bowel rotation. No bowel obstruction or inflammation. Scattered colonic diverticulosis. Status post appendectomy. Vascular/Lymphatic: Moderate aortoiliac and branch vessel atherosclerosis without aneurysm. Bilateral renal artery stents are noted.  No lymphadenopathy. Reproductive: Uterus and bilateral adnexa are unremarkable. Other: Tiny fat containing umbilical hernia. Mild dependent subcutaneous soft tissue edema along the lower lumbar spine and sacrum. Musculoskeletal: Chronic mild-to-moderate L2 compression of the superior endplate. No retropulsion. IMPRESSION: 1. Cardiomegaly with aortic atherosclerosis and 3 vessel coronary arteriosclerosis. 2. Fatty infiltration of the liver with slight gallbladder wall thickening likely due to underdistention. No gallstones are noted. 3. Tiny focus  of perihepatic mesenteric edema overlying the right hepatic lobe, nonspecific possibly related to recent unspecified inflammatory change. Colonic diverticulosis is currently noted without acute diverticulitis. 4. No acute bowel obstruction or inflammation. 5. 3 mm nonobstructing right renal calculus in the lower pole. 6. Chronic mild to moderate superior endplate compression of L2 without retropulsion. Electronically Signed   By: Ashley Royalty M.D.   On: 12/08/2016 17:13   Dg Chest 2 View  Result Date: 12/08/2016 CLINICAL DATA:  Abdominal pain x1 week, nausea EXAM: CHEST  2 VIEW COMPARISON:  09/04/2016 FINDINGS: Lungs are essentially clear. Possible mild right basilar atelectasis. No pleural effusion or pneumothorax. The heart is top-normal in size. Mild degenerative changes of the visualized thoracolumbar spine. IMPRESSION: No evidence of acute cardiopulmonary disease. Electronically Signed   By: Julian Hy M.D.   On: 12/08/2016 16:37        Scheduled Meds: . [START ON 12/10/2016] amoxicillin  500 mg Oral Q8H  . atorvastatin  80 mg Oral QHS  . bisoprolol  5 mg Oral BID  . pantoprazole (PROTONIX) IV  40 mg Intravenous Q12H  . potassium chloride  20 mEq Oral q1800  . potassium chloride SA  40 mEq Oral Daily   Continuous Infusions: . sodium chloride 50 mL/hr at 12/09/16 0441     LOS: 1 day    Time spent: 40mins    Dae Highley, MD [facility] Pager [phone]  If 7PM-7AM, please contact night-coverage [email or web address] Password [facility] 12/09/2016, 7:35 PM

## 2016-12-09 NOTE — Consult Note (Signed)
Referring Provider: No ref. provider found Primary Care Physician:  [name], MD Primary Gastroenterologist:  DR. Laural Golden  Reason for Consultation:  ABDOMINAL PAIN, ANEMIA   Impression: ADMITTED WITH LOWER ABDOMINAL PAIN DUE TO CYSTITIS. LOW BLOOD COUNT DUE TO AVMs ON COUMADIN. NO BRBPR OR MELENA.  Plan: 1. SUPPORTIVE CARE. CBC IN AM. 2. CONSIDER HEMATOLOGY FOR IVFE/EPOGEN 3. NO INDICATION FOR ENDOSCOPY AT THIS TIME(INR > 3). DR. Laural Golden WILL RE-ASSESS IN AM. 4. ADVANCE DIET. 5. ADD AMOXICILLIN 500 MG TID FOR 3 DAYS.      HPI:  LAST SEEN BY DR [facility] MAY 2018 FOR EGD W/ APC FOR GASTRIC AVMs. PT FEELING IN HER USUAL STATE OF HEALTH AND WOKE UP WITH LOWER ABDOMINAL DISCOMFORT. ABDOMINAL DISCOMFORT GOT WORSE AS THE DY WENT ON. SHE WENT TO [facility] TO GET HER GROCERIES AND FELT WEAK AND THE PAIN BECAME MORE INTENSE. SHE FELT WEAK AND QUEASY. SHE CAME TO ED AND WORKUP REVEALED UA WITH RARE BACTERIA, AND WBC: TNTC, 0-5 SQ EPIs, AND Hb 6.9 MCV 79.S8. Last CBC May 2018 Hb 8.3 MCV 86.8. CURRENTLY FEELS BETTER AND NOT HAVING ABDOMINAL PAIN.  PT DENIES FEVER, CHILLS, HEMATOCHEZIA, HEMATEMESIS, vomiting, melena, diarrhea, CHEST PAIN, SHORTNESS OF BREATH, CHANGE IN BOWEL IN HABITS, constipation, problems swallowing, OR heartburn or indigestion.     Past Medical History:  Diagnosis Date  . Angiodysplasia of stomach and duodenum   . Carotid artery disease ([place])    RIght CEA 05/2004  . Chronic anticoagulation   . Chronic atrial fibrillation ([place])   . Chronic blood loss anemia   . Chronic diastolic CHF (congestive heart failure) ([place])   . Coronary atherosclerosis of native coronary artery    a. RCA stent 2003 with 60% LCx, 60% OM1, 80% OM2 disease at that time.  . Diverticulosis   . Essential hypertension, benign   . Gastric ulcer   . GI bleeding    Recurrent GIB (prior gastric ulcer/diverticulosis/AVM in 2017, gastric AVM s/p endoscopic therapy in 05/2016, recurrent GIB 06/2016 with AVM  s/p therapy on endoscopy.  . Hyperlipidemia   . Lymphedema    a. in general, her LE edema appears to be a poor indicator of her total fluid status - chronic edema.  . Obesity   . Renal artery stenosis (HCC)    Bilateral renal artery stents 2004  . Tobacco abuse, in remission    40 pack year stopped in 1989  . Varicosities of leg     Past Surgical History:  Procedure Laterality Date  . APPENDECTOMY    . CAROTID ENDARTERECTOMY     Right in 2005, left in 2010  . COLONOSCOPY N/A 08/19/2015   Procedure: COLONOSCOPY;  Surgeon: Rogene Houston, MD;  [facility];  Service: Endoscopy;  Laterality: N/A;  7:30  . CORONARY ANGIOPLASTY    . ESOPHAGOGASTRODUODENOSCOPY N/A 08/19/2015   Procedure: ESOPHAGOGASTRODUODENOSCOPY (EGD);  Surgeon: Rogene Houston, MD;  [facility];  Service: Endoscopy;  Laterality: N/A;  . ESOPHAGOGASTRODUODENOSCOPY N/A 05/24/2016   Procedure: ESOPHAGOGASTRODUODENOSCOPY (EGD);  Surgeon: Rogene Houston, MD;  [facility];  Service: Endoscopy;  Laterality: N/A;  . ESOPHAGOGASTRODUODENOSCOPY N/A 06/19/2016   Procedure: ESOPHAGOGASTRODUODENOSCOPY (EGD);  Surgeon: Rogene Houston, MD;  [facility];  Service: Endoscopy;  Laterality: N/A;  . ESOPHAGOGASTRODUODENOSCOPY N/A 09/05/2016   Procedure: ESOPHAGOGASTRODUODENOSCOPY (EGD);  Surgeon: Rogene Houston, MD;  [facility];  Service: Endoscopy;  Laterality: N/A;  . GIVENS CAPSULE STUDY N/A 06/20/2016  Procedure: GIVENS CAPSULE STUDY;  Surgeon: Rogene Houston, MD;  [facility];  Service: Endoscopy;  Laterality: N/A;    Prior to Admission medications   Medication Sig Start Date End Date Taking? Authorizing Provider  acetaminophen (TYLENOL) 500 MG tablet Take 500 mg by mouth every 6 (six) hours as needed for headache.   Yes [provider]  atorvastatin (LIPITOR) 80 MG tablet TAKE 1 TABLET EVERY NIGHT AT BEDTIME 05/07/16  Yes Satira Sark, MD  bisoprolol  (ZEBETA) 5 MG tablet take 1 tablet by mouth twice a day 12/06/16  Yes Satira Sark, MD  metolazone (ZAROXOLYN) 5 MG tablet Take 1 tablet (5 mg total) by mouth 2 (two) times a week. Patient taking differently: Take 5 mg by mouth every Wednesday.  11/15/16 02/13/17 Yes Imogene Burn, PA-C  pantoprazole (PROTONIX) 40 MG tablet Take 1 tablet (40 mg total) by mouth 2 (two) times daily before a meal. 10/12/16  Yes Setzer, Rona Ravens, NP  Pediatric Multivitamins-Iron (FLINTSTONES PLUS IRON PO) Take 1 tablet by mouth 2 (two) times daily.    Yes [provider]  potassium chloride SA (K-DUR,KLOR-CON) 20 MEQ tablet Take 40 meq (2 tablets) in the am , and take 20 meq (1 tablet) in the pm Patient taking differently: Take 20-40 mEq by mouth See admin instructions. Take 40 meq (2 tablets) in the am , and take 20 meq (1 tablet) in the pm 11/01/16  Yes Satira Sark, MD  torsemide (DEMADEX) 20 MG tablet Take 4 tablets (80 mg total) by mouth 2 (two) times daily. 10/24/16  Yes Satira Sark, MD  warfarin (COUMADIN) 5 MG tablet Take 1 tablet daily except 1/2 tablet on Tuesdays and Saturdays or as directed by [facility] Patient taking differently: Take 2.5-5 mg by mouth daily. Takes 2.5mg  on Thursdays only 07/19/16  Yes Satira Sark, MD    Current Facility-Administered Medications  Medication Dose Route Frequency Provider Last Rate Last Dose  . 0.9 %  sodium chloride infusion   Intravenous Continuous Karmen Bongo, MD 50 mL/hr at 12/09/16 0441    . acetaminophen (TYLENOL) tablet 650 mg  650 mg Oral Q6H PRN Karmen Bongo, MD   650 mg at 12/09/16 0000   Or  . acetaminophen (TYLENOL) suppository 650 mg  650 mg Rectal Q6H PRN Karmen Bongo, MD      . atorvastatin (LIPITOR) tablet 80 mg  80 mg Oral Ivery Quale, MD   80 mg at 12/09/16 0001  . bisoprolol (ZEBETA) tablet 5 mg  5 mg Oral BID Karmen Bongo, MD   5 mg at 12/09/16 0001  . morphine 2 MG/ML injection 2 mg  2 mg  Intravenous Q2H PRN Karmen Bongo, MD      . ondansetron [facility]) tablet 4 mg  4 mg Oral Q6H PRN Karmen Bongo, MD       Or  . ondansetron [facility]) injection 4 mg  4 mg Intravenous Q6H PRN Karmen Bongo, MD      . pantoprazole (PROTONIX) injection 40 mg  40 mg Intravenous Lillia Mountain, MD      . potassium chloride SA (K-DUR,KLOR-CON) CR tablet 20 mEq  20 mEq Oral q1800 Karmen Bongo, MD      . potassium chloride SA (K-DUR,KLOR-CON) CR tablet 40 mEq  40 mEq Oral Daily Karmen Bongo, MD   40 mEq at 12/09/16 0002   ROCEPHIN 1GM  IV x1.  Allergies as of 12/08/2016 - Review Complete 12/08/2016  Allergen Reaction Noted  . Diltiazem Other (See Comments) 01/03/2012  . Verapamil Other (See Comments) 01/03/2012    Family History  Problem Relation Age of Onset  . Lung cancer Mother   . Cancer Mother   . Hypertension Daughter   . Hyperlipidemia Daughter   . Hypertension Son   . Hypertension Daughter   . Heart disease Maternal Grandmother      Social History   Social History  . Marital status: Widowed    Spouse name: N/A  . Number of children: 4  . Years of education: N/A   Occupational History  . housewife Retired   Social History Main Topics  . Smoking status: Former Smoker    Types: Cigarettes    Quit date: 04/30/1987  . Smokeless tobacco: Never Used  . Alcohol use No  . Drug use: No  . Sexual activity: Not on file   Other Topics Concern  . Not on file   Social History Narrative  . No narrative on file    Review of Systems: PER HPI OTHERWISE ALL SYSTEMS ARE NEGATIVE.   Vitals: Blood pressure (!) 112/41, pulse 76, temperature 97.7 F (36.5 C), temperature source Oral, resp. rate 18, height 5\' 1"  (1.549 m), weight 161 lb 13.1 oz (73.4 kg), SpO2 97 %.  Physical Exam: General:   Alert,  Well-developed, well-nourished, pleasant and cooperative in NAD Head:  Normocephalic and atraumatic. Eyes:  Sclera clear, no icterus.   Conjunctiva pink. Neck:   Supple; no masses. Lungs:  Clear throughout to auscultation.   No wheezes. No acute distress. Heart:  Regular rate and IRREGULAR rhythm; no murmurs. Abdomen:  Soft, nontender and nondistended. No masses noted. Normal bowel sounds, without guarding, and without rebound.   Msk:  Symmetrical with gross deformities.  Extremities:  With edema. Neurologic:  Alert and  oriented x4;  NO  NEW FOCAL DEFICITS Cervical Nodes:  No significant cervical adenopathy. Psych:  Alert and cooperative. Normal mood and affect.  Lab Results:  Recent Labs  12/08/16 1539 12/09/16 0645  WBC 9.5 9.5  HGB 6.9* 9.0*  HCT 22.7* 28.1*  PLT 255 202   BMET  Recent Labs  12/08/16 1539 12/09/16 0645  NA 133* 135  K 3.3* 3.3*  CL 91* 94*  CO2 28 31  GLUCOSE 186* 112*  BUN 49* 38*  CREATININE 1.61* 1.27*  CALCIUM 8.8* 9.0   LFT  Recent Labs  12/08/16 1539  PROT 7.2  ALBUMIN 3.3*  AST 38  ALT 18  ALKPHOS 85  BILITOT 1.0     Studies/Results: CXR: AUG 25: NO ACUTE FINDINGS   LOS: 1 day   Sandi Fields  12/09/2016, 11:44 AM

## 2016-12-10 ENCOUNTER — Encounter (HOSPITAL_COMMUNITY): Payer: Self-pay | Admitting: Physical Therapy

## 2016-12-10 ENCOUNTER — Other Ambulatory Visit: Payer: Self-pay

## 2016-12-10 DIAGNOSIS — D649 Anemia, unspecified: Secondary | ICD-10-CM

## 2016-12-10 DIAGNOSIS — K922 Gastrointestinal hemorrhage, unspecified: Secondary | ICD-10-CM

## 2016-12-10 DIAGNOSIS — Z5181 Encounter for therapeutic drug level monitoring: Secondary | ICD-10-CM

## 2016-12-10 DIAGNOSIS — Z7901 Long term (current) use of anticoagulants: Secondary | ICD-10-CM

## 2016-12-10 LAB — TYPE AND SCREEN
ABO/RH(D): O POS
Antibody Screen: NEGATIVE
UNIT DIVISION: 0
UNIT DIVISION: 0

## 2016-12-10 LAB — CBC WITH DIFFERENTIAL/PLATELET
BASOS PCT: 1 %
Basophils Absolute: 0.1 10*3/uL (ref 0.0–0.1)
EOS ABS: 0.1 10*3/uL (ref 0.0–0.7)
Eosinophils Relative: 1 %
HCT: 27.6 % — ABNORMAL LOW (ref 36.0–46.0)
HEMOGLOBIN: 8.7 g/dL — AB (ref 12.0–15.0)
Lymphocytes Relative: 7 %
Lymphs Abs: 0.8 10*3/uL (ref 0.7–4.0)
MCH: 25.1 pg — ABNORMAL LOW (ref 26.0–34.0)
MCHC: 31.5 g/dL (ref 30.0–36.0)
MCV: 79.8 fL (ref 78.0–100.0)
Monocytes Absolute: 1.3 10*3/uL — ABNORMAL HIGH (ref 0.1–1.0)
Monocytes Relative: 13 %
NEUTROS PCT: 78 %
Neutro Abs: 8.1 10*3/uL — ABNORMAL HIGH (ref 1.7–7.7)
Platelets: 195 10*3/uL (ref 150–400)
RBC: 3.46 MIL/uL — AB (ref 3.87–5.11)
RDW: 16.1 % — ABNORMAL HIGH (ref 11.5–15.5)
WBC: 10.3 10*3/uL (ref 4.0–10.5)

## 2016-12-10 LAB — PROTIME-INR
INR: 2.58
PROTHROMBIN TIME: 28.2 s — AB (ref 11.4–15.2)

## 2016-12-10 LAB — BPAM RBC
BLOOD PRODUCT EXPIRATION DATE: 201809242359
Blood Product Expiration Date: 201809242359
ISSUE DATE / TIME: 201808252050
ISSUE DATE / TIME: 201808260120
UNIT TYPE AND RH: 5100
Unit Type and Rh: 5100

## 2016-12-10 MED ORDER — AMOXICILLIN 500 MG PO CAPS: 500.0000 mg | ORAL_CAPSULE | Freq: Three times a day (TID) | ORAL | 0 refills | Status: DC

## 2016-12-10 MED ORDER — DILTIAZEM HCL 60 MG PO TABS: 60.0000 mg | ORAL_TABLET | Freq: Once | ORAL | Status: DC

## 2016-12-10 NOTE — Progress Notes (Signed)
  Subjective:  Patient has no complaints. She says she may feeding tired for the last few days but did not tell her daughters. She also developed lower abdominal pain prior to admission. She's not feeling better since she has been Amoxil for cystitis. She is taking [place] chewable with iron 2 tablets daily. She does not take OTC NSAIDs. She wants to know if she could come off warfarin and also her risk of CVA off anticoagulation.  Objective: Blood pressure 116/80, pulse (!) 144, temperature 98.4 F (36.9 C), temperature source Oral, resp. rate 20, height 5\' 1"  (1.549 m), weight 161 lb 13.1 oz (73.4 kg), SpO2 92 %. Patient is alert and in no acute distress. Abdomen is full soft and nontender without organomegaly or masses. She has 2-3+ pitting edema involving both legs with changes of stasis dermatitis. She also has varicose veins in both legs.  Labs/studies Results:   Recent Labs  12/08/16 1539 12/09/16 0645 12/10/16 0636  WBC 9.5 9.5 10.3  HGB 6.9* 9.0* 8.7*  HCT 22.7* 28.1* 27.6*  PLT 255 202 195    BMET   Recent Labs  12/08/16 1539 12/09/16 0645  NA 133* 135  K 3.3* 3.3*  CL 91* 94*  CO2 28 31  GLUCOSE 186* 112*  BUN 49* 38*  CREATININE 1.61* 1.27*  CALCIUM 8.8* 9.0    LFT   Recent Labs  12/08/16 1539  PROT 7.2  ALBUMIN 3.3*  AST 38  ALT 18  ALKPHOS 85  BILITOT 1.0    PT/INR   Recent Labs  12/09/16 0645 12/10/16 0636  LABPROT 27.1* 28.2*  INR 2.46 2.58       Assessment:  #1.Anemia secondary to chronic GI bleed. Prior evaluation has revealed gastric AV malformations and these have been treated on 2 successive EGDs. She was also found to have few small AVMs in small bowel without bleeding. She cannot be maintained at reasonable level she may have to be referred to [facility] for small bowel endoscopy. Patient has received 2 units of PRBCs.  #2. Cystitis. Patient is on amoxicillin and feeling better. Culture  pending.  Recommendations:  Resume warfarin at a low dose while she is on an antibiotic after which she can go back to her usual dose. She needs to discuss with Dr. [name] her risk of CVA if she was to stop taking warfarin. Agree with discharge planning. Will plan to check her H&H on 12/24/2016.

## 2016-12-10 NOTE — Progress Notes (Signed)
PROGRESS NOTE    [name]  KGY:[id number] DOB: 1932/11/17 DOA: 12/08/2016 PCP: [name], MD    Brief Narrative:  83 year old female with a history of AVMs, angiodysplasia GI tract, diastolic CHF, atrial fibrillation on Coumadin, presented to hospital with abdominal pain, weakness and nausea. Found to have low hemoglobin and concerns for GI bleeding. She was transfused PRBCs after admission to the hospital and GI was consulted. Coumadin has been held, although INR was supratherapeutic on admission. She is also found to have a urinary tract infection and put on antibiotics.   Assessment & Plan:   Principal Problem:   Symptomatic anemia Active Problems:   Chronic atrial fibrillation (HCC)   Hypertension   Varicose veins of bilateral lower extremities with other complications   Anticoagulated on Coumadin   Acute renal failure superimposed on stage 3 chronic kidney disease (HCC)   GI bleed   Asymptomatic bacteriuria   Urinary tract infection without hematuria   1. Symptomatic anemia. Patient presented with weakness, nausea . and have low hemoglobin and was transfused 2 units of PRBCs. Follow-up hemoglobin has improved. Recheck in a.m . 2. Urinary tract infection. Patient presented with abdominal pain. It is felt that her discomfort may be related to UTI. She received 1 dose of Rocephin. Started on oral amoxicillin. Follow-up urine culture 3. GI bleeding. Patient has a history of AVMs and angiodysplasia. She has had bleeding episodes in the past. Continue on Protonix twice a day. GI following. No indication for endoscopy at this time since INR is elevated. Repeat labs in a.m. 4. Atrial fibrillation, chronic. CHADSVASc 5. Patient is tachycardic today. She does not have any palpitations. We'll continue her on bisoprolol. Give 1 extra dose of diltiazem. On her heart rate overnight. Continue anticoagulation. 5. Acute kidney injury on chronic kidney disease stage III. Creatinine has  improved with volume resuscitation. Continue to monitor.   DVT prophylaxis: Foot pump Code Status: DO NOT RESUSCITATE Family Communication: Discussed with multiple family members at the bedside Disposition Plan: Discharge home once improved   Consultants:   Gastroenterology  Procedures:     Antimicrobials:   Rocephin 1 dose  Amoxicillin 8/26>>   Subjective: No abdominal pain. No chest pain or shortness of breath  Objective: Vitals:   12/09/16 2050 12/10/16 0534 12/10/16 1724 12/10/16 1918  BP: 90/60 116/80 (!) 106/56 119/83  Pulse: (!) 110 (!) 144 70 86  Resp: 20 20 (!) 22 20  Temp: 98.7 F (37.1 C) 98.4 F (36.9 C) 98 F (36.7 C) 98 F (36.7 C)  TempSrc: Oral Oral Oral Oral  SpO2: 96% 92% 100% 95%  Weight:      Height:        Intake/Output Summary (Last 24 hours) at 12/10/16 1932 Last data filed at 12/10/16 1900  Gross per 24 hour  Intake          1798.33 ml  Output             2050 ml  Net          -251.67 ml   Filed Weights   12/08/16 1521 12/08/16 1937  Weight: 73 kg (161 lb) 73.4 kg (161 lb 13.1 oz)    Examination:  General exam: Appears calm and comfortable  Respiratory system: Clear to auscultation. Respiratory effort normal. Cardiovascular system: S1 & S2 heard, Irregular. No JVD, murmurs, rubs, gallops or clicks. 1-2+ pedal edema (chronic). Gastrointestinal system: Abdomen is nondistended, soft and nontender. No organomegaly or masses felt. Normal bowel  sounds heard. Central nervous system: Alert and oriented. No focal neurological deficits. Extremities: Chronic lymphedema and varicosities of lower extremities bilaterally Skin: No rashes, lesions or ulcers Psychiatry: Judgement and insight appear normal. Mood & affect appropriate.     Data Reviewed: I have personally reviewed following labs and imaging studies  CBC:  Recent Labs Lab 12/08/16 1539 12/09/16 0645 12/10/16 0636  WBC 9.5 9.5 10.3  NEUTROABS 7.6  --  8.1*  HGB 6.9*  9.0* 8.7*  HCT 22.7* 28.1* 27.6*  MCV 79.4 79.2 79.8  PLT 255 202 814   Basic Metabolic Panel:  Recent Labs Lab 12/08/16 1539 12/09/16 0645  NA 133* 135  K 3.3* 3.3*  CL 91* 94*  CO2 28 31  GLUCOSE 186* 112*  BUN 49* 38*  CREATININE 1.61* 1.27*  CALCIUM 8.8* 9.0   GFR: Estimated Creatinine Clearance: 31.3 mL/min (A) (by C-G formula based on SCr of 1.27 mg/dL (H)). Liver Function Tests:  Recent Labs Lab 12/08/16 1539  AST 38  ALT 18  ALKPHOS 85  BILITOT 1.0  PROT 7.2  ALBUMIN 3.3*    Recent Labs Lab 12/08/16 1539  LIPASE 96*   No results for input(s): AMMONIA in the last 168 hours. Coagulation Profile:  Recent Labs Lab 12/08/16 1650 12/09/16 0645 12/10/16 0636  INR 3.02 2.46 2.58   Cardiac Enzymes:  Recent Labs Lab 12/08/16 1539 12/08/16 2023 12/09/16 1704  TROPONINI 0.04* 0.05* 0.06*   BNP (last 3 results) No results for input(s): PROBNP in the last 8760 hours. HbA1C: No results for input(s): HGBA1C in the last 72 hours. CBG: No results for input(s): GLUCAP in the last 168 hours. Lipid Profile: No results for input(s): CHOL, HDL, LDLCALC, TRIG, CHOLHDL, LDLDIRECT in the last 72 hours. Thyroid Function Tests: No results for input(s): TSH, T4TOTAL, FREET4, T3FREE, THYROIDAB in the last 72 hours. Anemia Panel: No results for input(s): VITAMINB12, FOLATE, FERRITIN, TIBC, IRON, RETICCTPCT in the last 72 hours. Sepsis Labs: No results for input(s): PROCALCITON, LATICACIDVEN in the last 168 hours.  No results found for this or any previous visit (from the past 240 hour(s)).       Radiology Studies: No results found.      Scheduled Meds: . amoxicillin  500 mg Oral Q8H  . atorvastatin  80 mg Oral QHS  . bisoprolol  5 mg Oral BID  . diltiazem  60 mg Oral Once  . pantoprazole (PROTONIX) IV  40 mg Intravenous Q12H  . potassium chloride  20 mEq Oral q1800  . potassium chloride SA  40 mEq Oral Daily   Continuous Infusions: . sodium  chloride 50 mL/hr at 12/09/16 2222     LOS: 2 days    Time spent: 36mins    Ramesha Poster, MD [facility] Pager [phone]  If 7PM-7AM, please contact night-coverage [email or web address] Password [facility] 12/10/2016, 7:32 PM

## 2016-12-11 ENCOUNTER — Other Ambulatory Visit: Payer: Self-pay

## 2016-12-11 ENCOUNTER — Telehealth: Payer: Self-pay | Admitting: *Deleted

## 2016-12-11 LAB — CBC
HCT: 29.5 % — ABNORMAL LOW (ref 36.0–46.0)
Hemoglobin: 9.1 g/dL — ABNORMAL LOW (ref 12.0–15.0)
MCH: 24.9 pg — ABNORMAL LOW (ref 26.0–34.0)
MCHC: 30.8 g/dL (ref 30.0–36.0)
MCV: 80.8 fL (ref 78.0–100.0)
PLATELETS: 202 10*3/uL (ref 150–400)
RBC: 3.65 MIL/uL — ABNORMAL LOW (ref 3.87–5.11)
RDW: 16.5 % — AB (ref 11.5–15.5)
WBC: 9.4 10*3/uL (ref 4.0–10.5)

## 2016-12-11 LAB — PROTIME-INR
INR: 1.9
PROTHROMBIN TIME: 22.1 s — AB (ref 11.4–15.2)

## 2016-12-11 MED ORDER — METOLAZONE 5 MG PO TABS: ORAL_TABLET | ORAL | 6 refills | Status: DC

## 2016-12-11 MED ORDER — VERAPAMIL HCL 80 MG PO TABS
80.0000 mg | ORAL_TABLET | Freq: Two times a day (BID) | ORAL | Status: DC
  Administered 2016-12-11: 80 mg via ORAL
  Filled 2016-12-11 (×2): qty 1

## 2016-12-11 MED ORDER — VERAPAMIL HCL 80 MG PO TABS: 80.0000 mg | ORAL_TABLET | Freq: Two times a day (BID) | ORAL | 0 refills | Status: DC

## 2016-12-11 NOTE — Telephone Encounter (Signed)
-----   Message from Imogene Burn, PA-C sent at 12/04/2016  7:58 AM EDT ----- Kidney function up a little more. Decrease metolazone to once weekly or as needed. How is her weight and edema?

## 2016-12-11 NOTE — Consult Note (Signed)
   [facility] CM Inpatient Consult   12/11/2016  MAURISHA MONGEAU 1932/09/08 [id number]   Patient screened for potential [facility] services. Patient is on the [facility] registry as a benefit of their [employer] medicare . Inpatient case manager stated patient would be a good candidate for [facility] services. Attempted to see patient but was discharged by the time liaison got to inpatient room. For questions please contact:   Zenya Hickam RN, [facility] Liaison  [phone]) Business Mobile [phone]) Toll free office

## 2016-12-11 NOTE — Discharge Summary (Signed)
Physician Discharge Summary  [name] OZH:[id number] DOB: [birth year] DOA: 12/08/2016  PCP: [name], MD  Admit date: 12/08/2016 Discharge date: 12/11/2016  Admitted From: home Disposition:  home  Recommendations for Outpatient Follow-up:  1. Follow up with PCP in 1-2 weeks 2. Please obtain BMP/CBC in one week 3. Follow up with Dr. Laural Golden as scheduled 4. Follow up with Dr. [name] in 1-2 weeks  Discharge Condition: stable CODE STATUS:full code Diet recommendation: Heart Healthy  Brief/Interim Summary: 83 year old female with a history of AVMs, angiodysplasia GI tract, diastolic CHF, atrial fibrillation on Coumadin, presented to hospital with abdominal pain, weakness and nausea. Found to have low hemoglobin and concerns for GI bleeding. She was transfused PRBCs after admission to the hospital and GI was consulted. Coumadin has been held, although INR was supratherapeutic on admission. She is also found to have a urinary tract infection and put on antibiotics.  Discharge Diagnoses:  Principal Problem:   Symptomatic anemia Active Problems:   Chronic atrial fibrillation (HCC)   Hypertension   Varicose veins of bilateral lower extremities with other complications   Anticoagulated on Coumadin   Acute renal failure superimposed on stage 3 chronic kidney disease (HCC)   GI bleed   Asymptomatic bacteriuria   Urinary tract infection without hematuria  1. Symptomatic anemia. Patient presented with weakness, nausea . She was found to have low hemoglobin and was transfused 2 units of PRBCs. Follow-up hemoglobin has improved.  2. Urinary tract infection. Patient presented with abdominal pain. It is felt that her discomfort may be related to UTI. She received 1 dose of Rocephin. Started on oral amoxicillin. Urine culture shows gram negative rods. Follow up sensitivities 3. GI bleeding. Patient has a history of AVMs and angiodysplasia. She has had bleeding episodes in the past.  Continue on Protonix twice a day. GI following. She did not have any gross evidence of bleeding. It was felt that her bleeding may be due to AVMs in small bowel. Dr. Laural Golden offered patient evaluated at [facility], but she has declined at this time. Her hemoglobin has been stable. She she has not had any life threatening bleeding at this time, since she has a high risk for stroke, will continue anticoagulation at this time. 4. Atrial fibrillation, chronic. CHADSVASc 5. Patient developed tachycardia despite taking bisoprolol. Case discussed with Dr. [name] who suggested starting verapamil. She was started on low dose verapamil with good heart rate control. She is anticoagulated on coumadin. Follow up with cardiology. 5. Acute kidney injury on chronic kidney disease stage III. Creatinine has improved with volume resuscitation. Restart on diuretics on discharge. Reviewed cardiology note who recommended decreasing metolazone to weekly prn.  Discharge Instructions  Discharge Instructions    Diet - low sodium heart healthy    Complete by:  As directed    Diet - low sodium heart healthy    Complete by:  As directed    Increase activity slowly    Complete by:  As directed    Increase activity slowly    Complete by:  As directed      Allergies as of 12/11/2016      Reactions   Diltiazem Other (See Comments)   Edema      Medication List    TAKE these medications   acetaminophen 500 MG tablet Commonly known as:  TYLENOL Take 500 mg by mouth every 6 (six) hours as needed for headache.   amoxicillin 500 MG capsule Commonly known as:  AMOXIL Take 1 capsule (  500 mg total) by mouth 3 (three) times daily.   atorvastatin 80 MG tablet Commonly known as:  LIPITOR TAKE 1 TABLET EVERY NIGHT AT BEDTIME   bisoprolol 5 MG tablet Commonly known as:  ZEBETA take 1 tablet by mouth twice a day   FLINTSTONES PLUS IRON PO Take 1 tablet by mouth 2 (two) times daily.   metolazone 5 MG tablet Commonly known as:   ZAROXOLYN Take 1 tablet po once a week as needed for swelling What changed:  how much to take  how to take this  when to take this  additional instructions   pantoprazole 40 MG tablet Commonly known as:  PROTONIX Take 1 tablet (40 mg total) by mouth 2 (two) times daily before a meal.   potassium chloride SA 20 MEQ tablet Commonly known as:  K-DUR,KLOR-CON Take 40 meq (2 tablets) in the am , and take 20 meq (1 tablet) in the pm What changed:  how much to take  how to take this  when to take this  additional instructions   torsemide 20 MG tablet Commonly known as:  DEMADEX Take 4 tablets (80 mg total) by mouth 2 (two) times daily.   verapamil 80 MG tablet Commonly known as:  CALAN Take 1 tablet (80 mg total) by mouth 2 (two) times daily.   warfarin 5 MG tablet Commonly known as:  COUMADIN Take 1 tablet daily except 1/2 tablet on Tuesdays and Saturdays or as directed by [facility] What changed:  how much to take  how to take this  when to take this  additional instructions            Discharge Care Instructions        Start     Ordered   12/11/16 0000  verapamil (CALAN) 80 MG tablet  2 times daily     12/11/16 1142   12/11/16 0000  Increase activity slowly     12/11/16 1142   12/11/16 0000  Diet - low sodium heart healthy     12/11/16 1142   12/11/16 0000  metolazone (ZAROXOLYN) 5 MG tablet     12/11/16 1144   12/10/16 0000  amoxicillin (AMOXIL) 500 MG capsule  3 times daily     12/10/16 1652   12/10/16 0000  Increase activity slowly     12/10/16 1652   12/10/16 0000  Diet - low sodium heart healthy     12/10/16 1652     Follow-up Information    Satira Sark, MD. Schedule an appointment as soon as possible for a visit in 2 week(s).   Specialty:  Cardiology Contact information: [place] [address] [phone]        Rogene Houston, MD Follow up.   Specialty:  Gastroenterology Why:  as  scheduled  Contact information: [address], [address] [place] [address] [phone]          Allergies  Allergen Reactions  . Diltiazem Other (See Comments)    Edema    Consultations:  gastroenterology   Procedures/Studies: Ct Abdomen Pelvis Wo Contrast  Result Date: 12/08/2016 CLINICAL DATA:  Intermittent abdominal pain progressively worsening over the past week. Nausea and vomiting. EXAM: CT ABDOMEN AND PELVIS WITHOUT CONTRAST TECHNIQUE: Multidetector CT imaging of the abdomen and pelvis was performed following the standard protocol without IV contrast. COMPARISON:  Abdomen radiographs from 09/04/2016 FINDINGS: Lower chest: Cardiomegaly with aortic atherosclerosis and 3 vessel coronary arteriosclerosis is noted. No pericardial  effusion. Visualization of the ventricular septum can be seen in anemia. No acute pulmonary disease. Hepatobiliary: Patchy ill-defined hypodense appearance of the liver may reflect areas of fatty infiltration. No definite space-occupying mass is identified on given the limitations of an unenhanced study. No biliary dilatation is identified. The gallbladder is nondistended without calculus. Gallbladder wall is borderline thickened but this could be due to underdistention. Mild inflammation is not entirely excluded. Nonspecific minimal perihepatic fatty induration is seen overlying the right hepatic lobe. Pancreas: Unremarkable. No pancreatic ductal dilatation or surrounding inflammatory changes. Spleen: Normal in size without focal abnormality. Adrenals/Urinary Tract: 3 mm nonobstructing calculus in the right lower pole. No hydroureteronephrosis. No focal mass identified on this unenhanced study. The urinary bladder is nondistended and unremarkable in appearance. Stomach/Bowel: Contracted appearance of the stomach. Normal small bowel rotation. No bowel obstruction or inflammation. Scattered colonic diverticulosis. Status post appendectomy. Vascular/Lymphatic:  Moderate aortoiliac and branch vessel atherosclerosis without aneurysm. Bilateral renal artery stents are noted. No lymphadenopathy. Reproductive: Uterus and bilateral adnexa are unremarkable. Other: Tiny fat containing umbilical hernia. Mild dependent subcutaneous soft tissue edema along the lower lumbar spine and sacrum. Musculoskeletal: Chronic mild-to-moderate L2 compression of the superior endplate. No retropulsion. IMPRESSION: 1. Cardiomegaly with aortic atherosclerosis and 3 vessel coronary arteriosclerosis. 2. Fatty infiltration of the liver with slight gallbladder wall thickening likely due to underdistention. No gallstones are noted. 3. Tiny focus of perihepatic mesenteric edema overlying the right hepatic lobe, nonspecific possibly related to recent unspecified inflammatory change. Colonic diverticulosis is currently noted without acute diverticulitis. 4. No acute bowel obstruction or inflammation. 5. 3 mm nonobstructing right renal calculus in the lower pole. 6. Chronic mild to moderate superior endplate compression of L2 without retropulsion. Electronically Signed   By: Ashley Royalty M.D.   On: 12/08/2016 17:13   Dg Chest 2 View  Result Date: 12/08/2016 CLINICAL DATA:  Abdominal pain x1 week, nausea EXAM: CHEST  2 VIEW COMPARISON:  09/04/2016 FINDINGS: Lungs are essentially clear. Possible mild right basilar atelectasis. No pleural effusion or pneumothorax. The heart is top-normal in size. Mild degenerative changes of the visualized thoracolumbar spine. IMPRESSION: No evidence of acute cardiopulmonary disease. Electronically Signed   By: Julian Hy M.D.   On: 12/08/2016 16:37       Subjective: Feeling better. No chest pain or shortness of breath  Discharge Exam: Vitals:   12/10/16 2156 12/11/16 0640  BP: (!) 139/94 127/81  Pulse: (!) 104   Resp: 20 20  Temp: 98.4 F (36.9 C) 97.7 F (36.5 C)  SpO2: 97% 94%   Vitals:   12/10/16 1918 12/10/16 2154 12/10/16 2156 12/11/16 0640   BP: 119/83  (!) 139/94 127/81  Pulse: 86  (!) 104   Resp: 20  20 20   Temp: 98 F (36.7 C)  98.4 F (36.9 C) 97.7 F (36.5 C)  TempSrc: Oral  Oral Oral  SpO2: 95% 92% 97% 94%  Weight:    77.6 kg (171 lb 1.6 oz)  Height:        General: Pt is alert, awake, not in acute distress Cardiovascular: irregular, S1/S2 +, no rubs, no gallops Respiratory: CTA bilaterally, no wheezing, no rhonchi Abdominal: Soft, NT, ND, bowel sounds + Extremities: lymphedema present    The results of significant diagnostics from this hospitalization (including imaging, microbiology, ancillary and laboratory) are listed below for reference.     Microbiology: Recent Results (from the past 240 hour(s))  Culture, Urine     Status: Abnormal (Preliminary result)  Collection Time: 12/08/16  3:39 PM  Result Value Ref Range Status   Specimen Description URINE, CLEAN CATCH  Final   Special Requests NONE  Final   Culture >=100,000 COLONIES/mL GRAM NEGATIVE RODS (A)  Final   Report Status PENDING  Incomplete     Labs: BNP (last 3 results)  Recent Labs  08/25/16 0922  BNP 154.0*   Basic Metabolic Panel:  Recent Labs Lab 12/08/16 1539 12/09/16 0645  NA 133* 135  K 3.3* 3.3*  CL 91* 94*  CO2 28 31  GLUCOSE 186* 112*  BUN 49* 38*  CREATININE 1.61* 1.27*  CALCIUM 8.8* 9.0   Liver Function Tests:  Recent Labs Lab 12/08/16 1539  AST 38  ALT 18  ALKPHOS 85  BILITOT 1.0  PROT 7.2  ALBUMIN 3.3*    Recent Labs Lab 12/08/16 1539  LIPASE 96*   No results for input(s): AMMONIA in the last 168 hours. CBC:  Recent Labs Lab 12/08/16 1539 12/09/16 0645 12/10/16 0636 12/11/16 0625  WBC 9.5 9.5 10.3 9.4  NEUTROABS 7.6  --  8.1*  --   HGB 6.9* 9.0* 8.7* 9.1*  HCT 22.7* 28.1* 27.6* 29.5*  MCV 79.4 79.2 79.8 80.8  PLT 255 202 195 202   Cardiac Enzymes:  Recent Labs Lab 12/08/16 1539 12/08/16 2023 12/09/16 1704  TROPONINI 0.04* 0.05* 0.06*   BNP: Invalid input(s):  POCBNP CBG: No results for input(s): GLUCAP in the last 168 hours. D-Dimer No results for input(s): DDIMER in the last 72 hours. Hgb A1c No results for input(s): HGBA1C in the last 72 hours. Lipid Profile No results for input(s): CHOL, HDL, LDLCALC, TRIG, CHOLHDL, LDLDIRECT in the last 72 hours. Thyroid function studies No results for input(s): TSH, T4TOTAL, T3FREE, THYROIDAB in the last 72 hours.  Invalid input(s): FREET3 Anemia work up No results for input(s): VITAMINB12, FOLATE, FERRITIN, TIBC, IRON, RETICCTPCT in the last 72 hours. Urinalysis    Component Value Date/Time   COLORURINE YELLOW 12/08/2016 1539   APPEARANCEUR HAZY (A) 12/08/2016 1539   LABSPEC 1.008 12/08/2016 1539   PHURINE 7.0 12/08/2016 1539   GLUCOSEU NEGATIVE 12/08/2016 1539   HGBUR NEGATIVE 12/08/2016 1539   BILIRUBINUR NEGATIVE 12/08/2016 1539   KETONESUR NEGATIVE 12/08/2016 1539   PROTEINUR NEGATIVE 12/08/2016 1539   UROBILINOGEN 0.2 08/31/2009 1628   NITRITE NEGATIVE 12/08/2016 1539   LEUKOCYTESUR LARGE (A) 12/08/2016 1539   Sepsis Labs Invalid input(s): PROCALCITONIN,  WBC,  LACTICIDVEN Microbiology Recent Results (from the past 240 hour(s))  Culture, Urine     Status: Abnormal (Preliminary result)   Collection Time: 12/08/16  3:39 PM  Result Value Ref Range Status   Specimen Description URINE, CLEAN CATCH  Final   Special Requests NONE  Final   Culture >=100,000 COLONIES/mL GRAM NEGATIVE RODS (A)  Final   Report Status PENDING  Incomplete     Time coordinating discharge: Over 30 minutes  SIGNED:   Kathie Dike, MD  [facility] 12/11/2016, 11:46 AM Pager   If 7PM-7AM, please contact night-coverage [email or web address] Password [email or web address]

## 2016-12-11 NOTE — Telephone Encounter (Signed)
Called patient with test results. No answer. Left message to call back.  

## 2016-12-11 NOTE — Progress Notes (Signed)
IV removed, WNL. Pt d/c instructions given to pt. Verbalized understanding. Pt family member at bedside to transport home.

## 2016-12-12 ENCOUNTER — Encounter (HOSPITAL_COMMUNITY): Payer: Self-pay

## 2016-12-12 ENCOUNTER — Ambulatory Visit: Payer: Self-pay | Admitting: Family

## 2016-12-12 ENCOUNTER — Encounter (HOSPITAL_COMMUNITY): Payer: Self-pay | Admitting: Physical Therapy

## 2016-12-12 ENCOUNTER — Ambulatory Visit: Payer: Medicare Other | Admitting: Family

## 2016-12-12 LAB — URINE CULTURE

## 2016-12-13 ENCOUNTER — Other Ambulatory Visit: Payer: Self-pay

## 2016-12-13 ENCOUNTER — Other Ambulatory Visit (INDEPENDENT_AMBULATORY_CARE_PROVIDER_SITE_OTHER): Payer: Self-pay | Admitting: *Deleted

## 2016-12-13 ENCOUNTER — Encounter (HOSPITAL_COMMUNITY)
Admission: RE | Admit: 2016-12-13 | Discharge: 2016-12-13 | Disposition: A | Discharge status: Home / Self Care | Payer: Medicare Other | Source: Ambulatory Visit | Attending: Ophthalmology | Admitting: Ophthalmology

## 2016-12-13 ENCOUNTER — Encounter (INDEPENDENT_AMBULATORY_CARE_PROVIDER_SITE_OTHER): Payer: Self-pay | Admitting: *Deleted

## 2016-12-13 DIAGNOSIS — Z01818 Encounter for other preprocedural examination: Secondary | ICD-10-CM | POA: Insufficient documentation

## 2016-12-13 DIAGNOSIS — D649 Anemia, unspecified: Secondary | ICD-10-CM

## 2016-12-13 LAB — POCT HEMOGLOBIN-HEMACUE: HEMOGLOBIN: 8.4 g/dL — AB (ref 12.0–15.0)

## 2016-12-13 NOTE — Progress Notes (Signed)
Dr. Delena Bali consulted to evaluate pt after current admission for symptomatic anemia due to blood loss from unknown cause. She received 2 units of PRBC and was discharged with hgb of 9.1. Upon arrival for PAT for cataract surgery, daughter was concerned that her "mom looked bad" and appeared that her heart was pounding. Performed an EKG for comparison and HemoCue. Will repeat HemoCue tomorrow per MD order. Pt and daughter educated and  instructed to seek emergency medical attention if symptoms of severe anemia presented. Verbalized understanding.

## 2016-12-14 ENCOUNTER — Encounter (HOSPITAL_COMMUNITY)
Admission: RE | Admit: 2016-12-14 | Discharge: 2016-12-14 | Disposition: A | Discharge status: Home / Self Care | Payer: Medicare Other | Source: Ambulatory Visit | Attending: Ophthalmology | Admitting: Ophthalmology

## 2016-12-14 ENCOUNTER — Encounter (HOSPITAL_COMMUNITY): Payer: Self-pay | Admitting: Emergency Medicine

## 2016-12-14 ENCOUNTER — Encounter (HOSPITAL_COMMUNITY): Payer: Self-pay | Admitting: Physical Therapy

## 2016-12-14 ENCOUNTER — Emergency Department (HOSPITAL_COMMUNITY)
Admission: EM | Admit: 2016-12-14 | Discharge: 2016-12-14 | Disposition: A | Discharge status: Home / Self Care | Payer: Medicare Other | Attending: Emergency Medicine | Admitting: Emergency Medicine

## 2016-12-14 ENCOUNTER — Other Ambulatory Visit: Payer: Self-pay

## 2016-12-14 DIAGNOSIS — Z79899 Other long term (current) drug therapy: Secondary | ICD-10-CM | POA: Insufficient documentation

## 2016-12-14 DIAGNOSIS — K922 Gastrointestinal hemorrhage, unspecified: Secondary | ICD-10-CM | POA: Diagnosis not present

## 2016-12-14 DIAGNOSIS — I2510 Atherosclerotic heart disease of native coronary artery without angina pectoris: Secondary | ICD-10-CM | POA: Insufficient documentation

## 2016-12-14 DIAGNOSIS — N179 Acute kidney failure, unspecified: Secondary | ICD-10-CM | POA: Diagnosis not present

## 2016-12-14 DIAGNOSIS — I5032 Chronic diastolic (congestive) heart failure: Secondary | ICD-10-CM | POA: Insufficient documentation

## 2016-12-14 DIAGNOSIS — R5083 Postvaccination fever: Secondary | ICD-10-CM | POA: Diagnosis not present

## 2016-12-14 DIAGNOSIS — Z7901 Long term (current) use of anticoagulants: Secondary | ICD-10-CM | POA: Diagnosis not present

## 2016-12-14 DIAGNOSIS — D500 Iron deficiency anemia secondary to blood loss (chronic): Secondary | ICD-10-CM | POA: Diagnosis not present

## 2016-12-14 DIAGNOSIS — N183 Chronic kidney disease, stage 3 (moderate): Secondary | ICD-10-CM | POA: Insufficient documentation

## 2016-12-14 DIAGNOSIS — Z87891 Personal history of nicotine dependence: Secondary | ICD-10-CM | POA: Insufficient documentation

## 2016-12-14 DIAGNOSIS — R5383 Other fatigue: Secondary | ICD-10-CM | POA: Diagnosis present

## 2016-12-14 DIAGNOSIS — I130 Hypertensive heart and chronic kidney disease with heart failure and stage 1 through stage 4 chronic kidney disease, or unspecified chronic kidney disease: Secondary | ICD-10-CM | POA: Diagnosis not present

## 2016-12-14 DIAGNOSIS — Z01818 Encounter for other preprocedural examination: Secondary | ICD-10-CM | POA: Diagnosis not present

## 2016-12-14 LAB — CBC WITH DIFFERENTIAL/PLATELET
BASOS ABS: 0 10*3/uL (ref 0.0–0.1)
Basophils Relative: 0 %
EOS PCT: 0 %
Eosinophils Absolute: 0 10*3/uL (ref 0.0–0.7)
HEMATOCRIT: 29.3 % — AB (ref 36.0–46.0)
HEMOGLOBIN: 9.1 g/dL — AB (ref 12.0–15.0)
LYMPHS ABS: 1.3 10*3/uL (ref 0.7–4.0)
LYMPHS PCT: 11 %
MCH: 24.8 pg — AB (ref 26.0–34.0)
MCHC: 31.1 g/dL (ref 30.0–36.0)
MCV: 79.8 fL (ref 78.0–100.0)
Monocytes Absolute: 1 10*3/uL (ref 0.1–1.0)
Monocytes Relative: 9 %
NEUTROS ABS: 9.4 10*3/uL — AB (ref 1.7–7.7)
NEUTROS PCT: 80 %
PLATELETS: 189 10*3/uL (ref 150–400)
RBC: 3.67 MIL/uL — AB (ref 3.87–5.11)
RDW: 17.1 % — ABNORMAL HIGH (ref 11.5–15.5)
WBC: 11.7 10*3/uL — AB (ref 4.0–10.5)

## 2016-12-14 LAB — COMPREHENSIVE METABOLIC PANEL
ALK PHOS: 98 U/L (ref 38–126)
ALT: 37 U/L (ref 14–54)
AST: 57 U/L — AB (ref 15–41)
Albumin: 3.3 g/dL — ABNORMAL LOW (ref 3.5–5.0)
Anion gap: 10 (ref 5–15)
BUN: 36 mg/dL — AB (ref 6–20)
CALCIUM: 9.3 mg/dL (ref 8.9–10.3)
CHLORIDE: 98 mmol/L — AB (ref 101–111)
CO2: 23 mmol/L (ref 22–32)
CREATININE: 2.2 mg/dL — AB (ref 0.44–1.00)
GFR, EST AFRICAN AMERICAN: 23 mL/min — AB (ref >60)
GFR, EST NON AFRICAN AMERICAN: 20 mL/min — AB (ref >60)
Glucose, Bld: 101 mg/dL — ABNORMAL HIGH (ref 65–99)
Potassium: 5.1 mmol/L (ref 3.5–5.1)
Sodium: 131 mmol/L — ABNORMAL LOW (ref 135–145)
Total Bilirubin: 1.8 mg/dL — ABNORMAL HIGH (ref 0.3–1.2)
Total Protein: 7 g/dL (ref 6.5–8.1)

## 2016-12-14 LAB — POC OCCULT BLOOD, ED: FECAL OCCULT BLD: POSITIVE — AB

## 2016-12-14 LAB — POCT HEMOGLOBIN-HEMACUE: HEMOGLOBIN: 6.5 g/dL — AB (ref 12.0–15.0)

## 2016-12-14 LAB — PROTIME-INR
INR: 2.06
PROTHROMBIN TIME: 23 s — AB (ref 11.4–15.2)

## 2016-12-14 LAB — I-STAT TROPONIN, ED: Troponin i, poc: 0.01 ng/mL (ref 0.00–0.08)

## 2016-12-14 LAB — APTT: APTT: 37 s — AB (ref 24–36)

## 2016-12-14 MED ORDER — SODIUM CHLORIDE 0.9 % IV BOLUS (SEPSIS)
500.0000 mL | Freq: Once | INTRAVENOUS | Status: AC
  Administered 2016-12-14: 500 mL via INTRAVENOUS

## 2016-12-14 NOTE — ED Provider Notes (Signed)
[facility] DEPT Provider Note   CSN: [id number] Arrival date & time: 12/14/16  0803     History   Chief Complaint Chief Complaint  Patient presents with  . Abnormal Lab    HPI [name] is a 83 y.o. female.  The history is provided by the patient.  Abnormal Lab  Time since result:  Hours Patient referred by:  ED personnel and PCP Resulting agency:  Internal Result type: hematology   Hematology:    Hematocrit:  Low   Hemoglobin:  Low   83 year old female who presents with abnormal hemoglobin. She has a history of angiodysplasia of the stomach and small bowel, gastric AVMs, prior peptic ulcer disease, atrial fibrillation on Coumadin, hypertension hyperlipidemia. Has history of chronic GI bleed due to AVMs, and as needed for recurrent blood transfusions. Was admitted earlier this week and discharged 2 days ago after a blood transfusion. Followed by Dr. Laural Golden.   States that she continues to feel weak since discharge from the hospital and still appears pale. She is currently also being treated for UTI. Denies any melena, hematochezia, nausea or vomiting, abdominal pain, fevers or chills, chest pain or difficulty breathing.  Past Medical History:  Diagnosis Date  . Angiodysplasia of stomach and duodenum   . Carotid artery disease ([place])    RIght CEA 05/2004  . Chronic anticoagulation   . Chronic atrial fibrillation ([place])   . Chronic blood loss anemia   . Chronic diastolic CHF (congestive heart failure) ([place])   . Coronary atherosclerosis of native coronary artery    a. RCA stent 2003 with 60% LCx, 60% OM1, 80% OM2 disease at that time.  . Diverticulosis   . Essential hypertension, benign   . Gastric ulcer   . GI bleeding    Recurrent GIB (prior gastric ulcer/diverticulosis/AVM in 2017, gastric AVM s/p endoscopic therapy in 05/2016, recurrent GIB 06/2016 with AVM s/p therapy on endoscopy.  . Hyperlipidemia   . Lymphedema    a. in general, her LE edema appears to  be a poor indicator of her total fluid status - chronic edema.  . Obesity   . Renal artery stenosis ([facility])    Bilateral renal artery stents 2004  . Tobacco abuse, in remission    40 pack year stopped in 1989  . Varicosities of leg     Patient Active Problem List   Diagnosis Date Noted  . Urinary tract infection without hematuria   . GI bleed 12/08/2016  . Asymptomatic bacteriuria 12/08/2016  . Lymphedema 11/14/2016  . CKD (chronic kidney disease) stage 3, GFR 30-59 ml/min 09/05/2016  . Hypokalemia 09/05/2016  . CHF exacerbation ([place]) 08/25/2016  . Difficulty in walking, not elsewhere classified   . Dizziness and giddiness   . Gastrointestinal hemorrhage associated with peptic ulcer   . Renal impairment   . Acute blood loss anemia 05/22/2016  . Chronic blood loss anemia 05/22/2016  . Chronic diastolic CHF (congestive heart failure) ([place]) 05/22/2016  . Acute renal failure superimposed on stage 3 chronic kidney disease ([place]) 05/22/2016  . Anticoagulated on Coumadin   . Symptomatic anemia 07/11/2015  . Microcytic anemia 07/11/2015  . Fatigue 07/11/2015  . Occlusion and stenosis of carotid artery without mention of cerebral infarction-Bilateral 11/11/2013  . Aftercare following surgery of the circulatory system, [place] 11/11/2013  . Varicose veins of bilateral lower extremities with other complications 73/41/9379  . Encounter for therapeutic drug monitoring 05/18/2013  . Fasting hyperglycemia 07/03/2012  . Chronic anticoagulation   . Tobacco  abuse, in remission   . Carotid artery occlusion   . Hypertension   . Obesity   . Hyperlipidemia   . Chronic venous insufficiency 07/26/2010  . Chronic atrial fibrillation ([place]) 04/05/2010  . Coronary atherosclerosis of native coronary artery 04/05/2010  . PERIPHERAL VASCULAR DISEASE 04/05/2010    Past Surgical History:  Procedure Laterality Date  . APPENDECTOMY    . CAROTID ENDARTERECTOMY     Right in 2005, left in 2010  . COLONOSCOPY  N/A 08/19/2015   Procedure: COLONOSCOPY;  Surgeon: Rogene Houston, MD;  [facility];  Service: Endoscopy;  Laterality: N/A;  7:30  . CORONARY ANGIOPLASTY    . ESOPHAGOGASTRODUODENOSCOPY N/A 08/19/2015   Procedure: ESOPHAGOGASTRODUODENOSCOPY (EGD);  Surgeon: Rogene Houston, MD;  Location[facility];  Service: Endoscopy;  Laterality: N/A;  . ESOPHAGOGASTRODUODENOSCOPY N/A 05/24/2016   Procedure: ESOPHAGOGASTRODUODENOSCOPY (EGD);  Surgeon: Rogene Houston, MD;  Location[facility];  Service: Endoscopy;  Laterality: N/A;  . ESOPHAGOGASTRODUODENOSCOPY N/A 06/19/2016   Procedure: ESOPHAGOGASTRODUODENOSCOPY (EGD);  Surgeon: Rogene Houston, MD;  [facility];  Service: Endoscopy;  Laterality: N/A;  . ESOPHAGOGASTRODUODENOSCOPY N/A 09/05/2016   Procedure: ESOPHAGOGASTRODUODENOSCOPY (EGD);  Surgeon: Rogene Houston, MD;  [facility];  Service: Endoscopy;  Laterality: N/A;  . GIVENS CAPSULE STUDY N/A 06/20/2016   Procedure: GIVENS CAPSULE STUDY;  Surgeon: Rogene Houston, MD;  [facility];  Service: Endoscopy;  Laterality: N/A;    OB History    Gravida Para Term Preterm AB Living             4   SAB TAB Ectopic Multiple Live Births                   Home Medications    Prior to Admission medications   Medication Sig Start Date End Date Taking? Authorizing Provider  acetaminophen (TYLENOL) 500 MG tablet Take 500 mg by mouth every 6 (six) hours as needed for headache.   Yes [provider]  amoxicillin (AMOXIL) 500 MG capsule Take 1 capsule (500 mg total) by mouth 3 (three) times daily. 12/10/16  Yes Kathie Dike, MD  atorvastatin (LIPITOR) 80 MG tablet TAKE 1 TABLET EVERY NIGHT AT BEDTIME 05/07/16  Yes Satira Sark, MD  bisoprolol (ZEBETA) 5 MG tablet take 1 tablet by mouth twice a day 12/06/16  Yes Satira Sark, MD  pantoprazole (PROTONIX) 40 MG tablet Take 1 tablet (40 mg total) by mouth 2 (two) times daily before a meal.  10/12/16  Yes Setzer, Rona Ravens, NP  Pediatric Multivitamins-Iron (FLINTSTONES PLUS IRON PO) Take 1 tablet by mouth 2 (two) times daily.    Yes [provider]  potassium chloride SA (K-DUR,KLOR-CON) 20 MEQ tablet Take 40 meq (2 tablets) in the am , and take 20 meq (1 tablet) in the pm Patient taking differently: Take 20-40 mEq by mouth See admin instructions. Take 40 meq (2 tablets) in the am , and take 20 meq (1 tablet) in the pm 11/01/16  Yes Satira Sark, MD  torsemide (DEMADEX) 20 MG tablet Take 4 tablets (80 mg total) by mouth 2 (two) times daily. 10/24/16  Yes Satira Sark, MD  verapamil (CALAN) 80 MG tablet Take 1 tablet (80 mg total) by mouth 2 (two) times daily. 12/11/16  Yes Kathie Dike, MD  warfarin (COUMADIN) 5 MG tablet Take 1 tablet daily except 1/2 tablet on Tuesdays and Saturdays or as directed by Coumadin  Clinic Patient taking differently: Take 2.5-5 mg by mouth daily. Takes 2.5mg  on Thursdays only 07/19/16  Yes Satira Sark, MD  metolazone (ZAROXOLYN) 5 MG tablet Take 1 tablet po once a week as needed for swelling 12/11/16   Kathie Dike, MD    Family History Family History  Problem Relation Age of Onset  . Lung cancer Mother   . Cancer Mother   . Hypertension Daughter   . Hyperlipidemia Daughter   . Hypertension Son   . Hypertension Daughter   . Heart disease Maternal Grandmother     Social History Social History  Substance Use Topics  . Smoking status: Former Smoker    Types: Cigarettes    Quit date: 04/30/1987  . Smokeless tobacco: Never Used  . Alcohol use No     Allergies   Diltiazem   Review of Systems Review of Systems  Constitutional: Positive for fatigue. Negative for fever.  Respiratory: Negative for shortness of breath.   Cardiovascular: Positive for leg swelling (chronic). Negative for chest pain.  Gastrointestinal: Negative for abdominal pain, anal bleeding, blood in stool, diarrhea, nausea and vomiting.  All other  systems reviewed and are negative.    Physical Exam Updated Vital Signs BP 121/73   Pulse (!) 114   Temp 97.6 F (36.4 C) (Oral)   Resp 12   Ht 5\' 1"  (1.549 m)   Wt 77.6 kg (171 lb)   SpO2 96%   BMI 32.31 kg/m   Physical Exam Physical Exam  Nursing note and vitals reviewed. Constitutional: Well developed, well nourished, appears pale, but non-toxic, and in no acute distress Head: Normocephalic and atraumatic.  Mouth/Throat: Oropharynx is clear and moist.  Neck: Normal range of motion. Neck supple.  Cardiovascular: Tachycardic rate and regular rhythm.   Pulmonary/Chest: Effort normal and breath sounds normal.  Abdominal: Soft. There is no tenderness. There is no rebound and no guarding. Weakly positive occult blood stool. Stool light brown without melena or hematochezia. Musculoskeletal: No deformity Neurological: Alert, no facial droop, fluent speech, moves all extremities symmetrically Skin: Skin is warm and dry. Pale.  Psychiatric: Cooperative   ED Treatments / Results  Labs (all labs ordered are listed, but only abnormal results are displayed) Labs Reviewed  CBC WITH DIFFERENTIAL/PLATELET - Abnormal; Notable for the following:       Result Value   WBC 11.7 (*)    RBC 3.67 (*)    Hemoglobin 9.1 (*)    HCT 29.3 (*)    MCH 24.8 (*)    RDW 17.1 (*)    Neutro Abs 9.4 (*)    All other components within normal limits  COMPREHENSIVE METABOLIC PANEL - Abnormal; Notable for the following:    Sodium 131 (*)    Chloride 98 (*)    Glucose, Bld 101 (*)    BUN 36 (*)    Creatinine, Ser 2.20 (*)    Albumin 3.3 (*)    AST 57 (*)    Total Bilirubin 1.8 (*)    GFR calc non Af Amer 20 (*)    GFR calc Af Amer 23 (*)    All other components within normal limits  PROTIME-INR - Abnormal; Notable for the following:    Prothrombin Time 23.0 (*)    All other components within normal limits  APTT - Abnormal; Notable for the following:    aPTT 37 (*)    All other components  within normal limits  POC OCCULT BLOOD, ED - Abnormal; Notable for the  following:    Fecal Occult Bld POSITIVE (*)    All other components within normal limits  I-STAT TROPONIN, ED  TYPE AND SCREEN    EKG  EKG Interpretation  Date/Time:  Friday December 14 2016 08:11:04 EDT Ventricular Rate:  98 PR Interval:    QRS Duration: 102 QT Interval:  339 QTC Calculation: 433 R Axis:   81 Text Interpretation:  Atrial fibrillation Low voltage, precordial leads Consider right ventricular hypertrophy Borderline T abnormalities, diffuse leads similar to previous EKG Confirmed by Brantley Stage [phone]) on 12/14/2016 8:22:49 AM       Radiology No results found.  Procedures Procedures (including critical care time)  Medications Ordered in ED Medications  sodium chloride 0.9 % bolus 500 mL (500 mLs Intravenous New Bag/Given 12/14/16 0949)     Initial Impression / Assessment and Plan / ED Course  I have reviewed the triage vital signs and the nursing notes.  Pertinent labs & imaging results that were available during my care of the patient were reviewed by me and considered in my medical decision making (see chart for details).     83 year old female with history of chronic anemia due to chronic GI bleed who presents with abnormal hemoglobin. Hemoglobin was I-STAT fingerstick obtained in preop area, she was getting clearance to undergo cataract extraction. Her lab hemoglobin here is confirmed to be 9.1, and this does not require blood transfusion emergently today.  She has brown stool that is mildly fecal occult positive. She has chronic GI bleeding from AVMs. Her INR is therapeutic at 2.0. It was decided upon discharge from the hospital that she will continue to benefit from persistent Coumadin use despite chronic GI bleed.  Blood work also shows evidence of mild acute kidney injury. She did receive a small bolus of IV fluids. She is not felt to require acute admission today. Discussed with  patient and her son to have close follow-up within 1 week for recheck on her kidney function and hemoglobin.  Strict return and follow-up instructions reviewed. She expressed understanding of all discharge instructions and felt comfortable with the plan of care.   Final Clinical Impressions(s) / ED Diagnoses   Final diagnoses:  Iron deficiency anemia due to chronic blood loss  Chronic GI bleeding  Acute kidney injury [facility])    New Prescriptions New Prescriptions   No medications on file     Forde Dandy, MD 12/14/16 [phone]

## 2016-12-14 NOTE — Progress Notes (Signed)
Pt here for follow up of hgb.after finding that her hegb was dropping after recent admission for blood trasnfusion. Hemocue hgb found to be 6.4 g/dl. Dr. Delena Bali notified of result. Dr. Laural Golden made aware. Pt taken to ED via wheelchair accompanied by son. Report given to [place], [place]

## 2016-12-14 NOTE — ED Triage Notes (Signed)
Pt brought over from day sx after having preop for cataract extraction yesterday.  Noted HGB has had steady decline in the past few days with hx of transfusion.  Pt denies complaints, but hgb was 6.4 today.

## 2016-12-14 NOTE — Discharge Instructions (Signed)
Your hemoglobin is 9.1 today. You do not need blood transfusion. Your kidney function as decreased. Drink fluids and eat well. Please have your primary care doctor or your GI doctor repeat your blood work within 1 week to check your hemoglobin and kidney function Return for worsening symptoms, including confusion, passing out, extreme fatigue or shortness of breath, chest pain, blood in your stools or black stools, or any other symptoms concerning to you.

## 2016-12-18 ENCOUNTER — Encounter (HOSPITAL_COMMUNITY): Payer: Self-pay | Admitting: *Deleted

## 2016-12-18 ENCOUNTER — Ambulatory Visit (INDEPENDENT_AMBULATORY_CARE_PROVIDER_SITE_OTHER): Payer: Medicare Other | Admitting: Adult Health

## 2016-12-18 ENCOUNTER — Encounter: Payer: Self-pay | Admitting: Adult Health

## 2016-12-18 ENCOUNTER — Inpatient Hospital Stay (HOSPITAL_COMMUNITY)
Admission: EM | Admit: 2016-12-18 | Discharge: 2016-12-20 | DRG: 291 | Disposition: A | Discharge status: Home / Self Care | Payer: Medicare Other | Attending: Internal Medicine | Admitting: Internal Medicine

## 2016-12-18 ENCOUNTER — Emergency Department (HOSPITAL_COMMUNITY): Payer: Medicare Other

## 2016-12-18 ENCOUNTER — Encounter (HOSPITAL_COMMUNITY): Payer: Self-pay | Admitting: Physical Therapy

## 2016-12-18 VITALS — BP 106/62 | HR 77 | Ht 60.0 in | Wt 173.0 lb

## 2016-12-18 DIAGNOSIS — R600 Localized edema: Secondary | ICD-10-CM

## 2016-12-18 DIAGNOSIS — I509 Heart failure, unspecified: Secondary | ICD-10-CM

## 2016-12-18 DIAGNOSIS — N183 Chronic kidney disease, stage 3 unspecified: Secondary | ICD-10-CM | POA: Diagnosis present

## 2016-12-18 DIAGNOSIS — Z8249 Family history of ischemic heart disease and other diseases of the circulatory system: Secondary | ICD-10-CM | POA: Diagnosis not present

## 2016-12-18 DIAGNOSIS — I2510 Atherosclerotic heart disease of native coronary artery without angina pectoris: Secondary | ICD-10-CM

## 2016-12-18 DIAGNOSIS — I739 Peripheral vascular disease, unspecified: Secondary | ICD-10-CM | POA: Diagnosis present

## 2016-12-18 DIAGNOSIS — I482 Chronic atrial fibrillation, unspecified: Secondary | ICD-10-CM | POA: Diagnosis present

## 2016-12-18 DIAGNOSIS — I130 Hypertensive heart and chronic kidney disease with heart failure and stage 1 through stage 4 chronic kidney disease, or unspecified chronic kidney disease: Principal | ICD-10-CM | POA: Diagnosis present

## 2016-12-18 DIAGNOSIS — Z801 Family history of malignant neoplasm of trachea, bronchus and lung: Secondary | ICD-10-CM

## 2016-12-18 DIAGNOSIS — N289 Disorder of kidney and ureter, unspecified: Secondary | ICD-10-CM

## 2016-12-18 DIAGNOSIS — I5033 Acute on chronic diastolic (congestive) heart failure: Secondary | ICD-10-CM | POA: Diagnosis present

## 2016-12-18 DIAGNOSIS — I517 Cardiomegaly: Secondary | ICD-10-CM | POA: Diagnosis not present

## 2016-12-18 DIAGNOSIS — Z66 Do not resuscitate: Secondary | ICD-10-CM | POA: Diagnosis present

## 2016-12-18 DIAGNOSIS — E669 Obesity, unspecified: Secondary | ICD-10-CM | POA: Diagnosis present

## 2016-12-18 DIAGNOSIS — E785 Hyperlipidemia, unspecified: Secondary | ICD-10-CM | POA: Diagnosis present

## 2016-12-18 DIAGNOSIS — E876 Hypokalemia: Secondary | ICD-10-CM | POA: Diagnosis present

## 2016-12-18 DIAGNOSIS — E7800 Pure hypercholesterolemia, unspecified: Secondary | ICD-10-CM | POA: Diagnosis not present

## 2016-12-18 DIAGNOSIS — Z7901 Long term (current) use of anticoagulants: Secondary | ICD-10-CM

## 2016-12-18 DIAGNOSIS — I10 Essential (primary) hypertension: Secondary | ICD-10-CM | POA: Diagnosis present

## 2016-12-18 DIAGNOSIS — Z87891 Personal history of nicotine dependence: Secondary | ICD-10-CM | POA: Diagnosis not present

## 2016-12-18 DIAGNOSIS — Z8711 Personal history of peptic ulcer disease: Secondary | ICD-10-CM

## 2016-12-18 DIAGNOSIS — Z6834 Body mass index (BMI) 34.0-34.9, adult: Secondary | ICD-10-CM | POA: Diagnosis not present

## 2016-12-18 DIAGNOSIS — I890 Lymphedema, not elsewhere classified: Secondary | ICD-10-CM

## 2016-12-18 DIAGNOSIS — R609 Edema, unspecified: Secondary | ICD-10-CM

## 2016-12-18 LAB — COMPREHENSIVE METABOLIC PANEL
ALT: 45 U/L (ref 14–54)
AST: 48 U/L — ABNORMAL HIGH (ref 15–41)
Albumin: 3.4 g/dL — ABNORMAL LOW (ref 3.5–5.0)
Alkaline Phosphatase: 108 U/L (ref 38–126)
Anion gap: 11 (ref 5–15)
BUN: 19 mg/dL (ref 6–20)
CHLORIDE: 94 mmol/L — AB (ref 101–111)
CO2: 28 mmol/L (ref 22–32)
CREATININE: 1.35 mg/dL — AB (ref 0.44–1.00)
Calcium: 8.6 mg/dL — ABNORMAL LOW (ref 8.9–10.3)
GFR, EST AFRICAN AMERICAN: 41 mL/min — AB (ref >60)
GFR, EST NON AFRICAN AMERICAN: 35 mL/min — AB (ref >60)
Glucose, Bld: 163 mg/dL — ABNORMAL HIGH (ref 65–99)
Potassium: 2.3 mmol/L — CL (ref 3.5–5.1)
SODIUM: 133 mmol/L — AB (ref 135–145)
Total Bilirubin: 1.1 mg/dL (ref 0.3–1.2)
Total Protein: 7.1 g/dL (ref 6.5–8.1)

## 2016-12-18 LAB — TSH: TSH: 0.987 u[IU]/mL (ref 0.350–4.500)

## 2016-12-18 LAB — CBC WITH DIFFERENTIAL/PLATELET
Basophils Absolute: 0 10*3/uL (ref 0.0–0.1)
Basophils Relative: 0 %
EOS ABS: 0.1 10*3/uL (ref 0.0–0.7)
Eosinophils Relative: 1 %
HCT: 30.2 % — ABNORMAL LOW (ref 36.0–46.0)
Hemoglobin: 9.3 g/dL — ABNORMAL LOW (ref 12.0–15.0)
LYMPHS ABS: 0.9 10*3/uL (ref 0.7–4.0)
Lymphocytes Relative: 9 %
MCH: 24.2 pg — AB (ref 26.0–34.0)
MCHC: 30.8 g/dL (ref 30.0–36.0)
MCV: 78.4 fL (ref 78.0–100.0)
MONO ABS: 0.5 10*3/uL (ref 0.1–1.0)
Monocytes Relative: 5 %
NEUTROS PCT: 85 %
Neutro Abs: 8.7 10*3/uL — ABNORMAL HIGH (ref 1.7–7.7)
Platelets: 203 10*3/uL (ref 150–400)
RBC: 3.85 MIL/uL — AB (ref 3.87–5.11)
RDW: 16.9 % — AB (ref 11.5–15.5)
WBC: 10.2 10*3/uL (ref 4.0–10.5)

## 2016-12-18 LAB — URINALYSIS, ROUTINE W REFLEX MICROSCOPIC
Bilirubin Urine: NEGATIVE
Glucose, UA: NEGATIVE mg/dL
Hgb urine dipstick: NEGATIVE
Ketones, ur: NEGATIVE mg/dL
LEUKOCYTES UA: NEGATIVE
NITRITE: NEGATIVE
PROTEIN: NEGATIVE mg/dL
SPECIFIC GRAVITY, URINE: 1.008 (ref 1.005–1.030)
pH: 7 (ref 5.0–8.0)

## 2016-12-18 LAB — BASIC METABOLIC PANEL
ANION GAP: 9 (ref 5–15)
BUN: 18 mg/dL (ref 6–20)
CHLORIDE: 94 mmol/L — AB (ref 101–111)
CO2: 29 mmol/L (ref 22–32)
Calcium: 8.1 mg/dL — ABNORMAL LOW (ref 8.9–10.3)
Creatinine, Ser: 1.13 mg/dL — ABNORMAL HIGH (ref 0.44–1.00)
GFR calc non Af Amer: 44 mL/min — ABNORMAL LOW (ref >60)
GFR, EST AFRICAN AMERICAN: 51 mL/min — AB (ref >60)
Glucose, Bld: 102 mg/dL — ABNORMAL HIGH (ref 65–99)
POTASSIUM: 2.7 mmol/L — AB (ref 3.5–5.1)
SODIUM: 132 mmol/L — AB (ref 135–145)

## 2016-12-18 LAB — MAGNESIUM: MAGNESIUM: 1.6 mg/dL — AB (ref 1.7–2.4)

## 2016-12-18 LAB — PROTIME-INR
INR: 2.67
PROTHROMBIN TIME: 28.2 s — AB (ref 11.4–15.2)

## 2016-12-18 LAB — BRAIN NATRIURETIC PEPTIDE: B Natriuretic Peptide: 538 pg/mL — ABNORMAL HIGH (ref 0.0–100.0)

## 2016-12-18 MED ORDER — POTASSIUM CHLORIDE 10 MEQ/100ML IV SOLN
10.0000 meq | INTRAVENOUS | Status: AC
  Administered 2016-12-19 (×2): 10 meq via INTRAVENOUS
  Filled 2016-12-18: qty 100

## 2016-12-18 MED ORDER — ACETAMINOPHEN 500 MG PO TABS: 500.0000 mg | ORAL_TABLET | Freq: Four times a day (QID) | ORAL | Status: DC | PRN

## 2016-12-18 MED ORDER — PANTOPRAZOLE SODIUM 40 MG PO TBEC
40.0000 mg | DELAYED_RELEASE_TABLET | Freq: Two times a day (BID) | ORAL | Status: DC
Start: 2016-12-19 — End: 2016-12-20
  Administered 2016-12-19 – 2016-12-20 (×3): 40 mg via ORAL
  Filled 2016-12-18 (×4): qty 1

## 2016-12-18 MED ORDER — HEPARIN SODIUM (PORCINE) 5000 UNIT/ML IJ SOLN
5000.0000 [IU] | Freq: Three times a day (TID) | INTRAMUSCULAR | Status: DC
  Administered 2016-12-18 – 2016-12-19 (×2): 5000 [IU] via SUBCUTANEOUS
  Filled 2016-12-18 (×2): qty 1

## 2016-12-18 MED ORDER — POTASSIUM CHLORIDE 10 MEQ/100ML IV SOLN
10.0000 meq | INTRAVENOUS | Status: AC
  Administered 2016-12-18 (×3): 10 meq via INTRAVENOUS
  Filled 2016-12-18 (×2): qty 100

## 2016-12-18 MED ORDER — VERAPAMIL HCL 80 MG PO TABS
80.0000 mg | ORAL_TABLET | Freq: Two times a day (BID) | ORAL | Status: DC
  Administered 2016-12-18 – 2016-12-20 (×4): 80 mg via ORAL
  Filled 2016-12-18 (×4): qty 1

## 2016-12-18 MED ORDER — SODIUM CHLORIDE 0.9 % IV SOLN: 250.0000 mL | INTRAVENOUS | Status: DC | PRN

## 2016-12-18 MED ORDER — FUROSEMIDE 10 MG/ML IJ SOLN
80.0000 mg | Freq: Once | INTRAMUSCULAR | Status: AC
  Administered 2016-12-18: 80 mg via INTRAVENOUS
  Filled 2016-12-18: qty 8

## 2016-12-18 MED ORDER — SODIUM CHLORIDE 0.9% FLUSH
3.0000 mL | Freq: Two times a day (BID) | INTRAVENOUS | Status: DC
Start: 2016-12-18 — End: 2016-12-20
  Administered 2016-12-19 – 2016-12-20 (×2): 3 mL via INTRAVENOUS

## 2016-12-18 MED ORDER — ATORVASTATIN CALCIUM 40 MG PO TABS
80.0000 mg | ORAL_TABLET | Freq: Every day | ORAL | Status: DC
  Administered 2016-12-18 – 2016-12-19 (×2): 80 mg via ORAL
  Filled 2016-12-18 (×2): qty 2

## 2016-12-18 MED ORDER — FUROSEMIDE 10 MG/ML IJ SOLN
8.0000 mg/h | INTRAVENOUS | Status: DC
  Administered 2016-12-19: 8 mg/h via INTRAVENOUS
  Filled 2016-12-18 (×2): qty 25

## 2016-12-18 MED ORDER — ACETAMINOPHEN 325 MG PO TABS
650.0000 mg | ORAL_TABLET | ORAL | Status: DC | PRN
  Administered 2016-12-19 – 2016-12-20 (×3): 650 mg via ORAL
  Filled 2016-12-18 (×3): qty 2

## 2016-12-18 MED ORDER — POTASSIUM CHLORIDE CRYS ER 20 MEQ PO TBCR
40.0000 meq | EXTENDED_RELEASE_TABLET | Freq: Once | ORAL | Status: AC
  Administered 2016-12-18: 40 meq via ORAL
  Filled 2016-12-18: qty 2

## 2016-12-18 MED ORDER — SODIUM CHLORIDE 0.9% FLUSH: 3.0000 mL | INTRAVENOUS | Status: DC | PRN

## 2016-12-18 MED ORDER — BISOPROLOL FUMARATE 5 MG PO TABS
5.0000 mg | ORAL_TABLET | Freq: Two times a day (BID) | ORAL | Status: DC
  Administered 2016-12-18 – 2016-12-20 (×4): 5 mg via ORAL
  Filled 2016-12-18 (×4): qty 1

## 2016-12-18 MED ORDER — ONDANSETRON HCL 4 MG/2ML IJ SOLN: 4.0000 mg | Freq: Four times a day (QID) | INTRAMUSCULAR | Status: DC | PRN

## 2016-12-18 MED ORDER — POTASSIUM CHLORIDE CRYS ER 20 MEQ PO TBCR
20.0000 meq | EXTENDED_RELEASE_TABLET | Freq: Every day | ORAL | Status: DC
  Administered 2016-12-19: 20 meq via ORAL
  Filled 2016-12-18: qty 1

## 2016-12-18 MED ORDER — MAGNESIUM SULFATE 2 GM/50ML IV SOLN
2.0000 g | Freq: Once | INTRAVENOUS | Status: AC
  Administered 2016-12-19: 2 g via INTRAVENOUS
  Filled 2016-12-18: qty 50

## 2016-12-18 NOTE — ED Notes (Signed)
Report to Lisa, RN 300 

## 2016-12-18 NOTE — ED Provider Notes (Signed)
Emergency Department Provider Note   I have reviewed the triage vital signs and the nursing notes.   HISTORY  Chief Complaint Leg Swelling   HPI [name] is a 83 y.o. female with PMH of CAD, chronic a-fib on Coumadin, CHF, HTN, HLD, and lymphedema presents to the emergency department from cardiac rehabilitation with unintentional 20 pound weight gain and worsening kidney function despite attempt for outpatient diuresis. Patient denies any chest pain or dyspnea. She's had worsening lower extremity edema over the past week and a half. She reports having very heavy legs which are making it difficult to walk. No fever or chills. She has ongoing oozing from the legs.   Past Medical History:  Diagnosis Date  . Angiodysplasia of stomach and duodenum   . Carotid artery disease ([place])    RIght CEA 05/2004  . Chronic anticoagulation   . Chronic atrial fibrillation ([place])   . Chronic blood loss anemia   . Chronic diastolic CHF (congestive heart failure) ([place])   . Coronary atherosclerosis of native coronary artery    a. RCA stent 2003 with 60% LCx, 60% OM1, 80% OM2 disease at that time.  . Diverticulosis   . Essential hypertension, benign   . Gastric ulcer   . GI bleeding    Recurrent GIB (prior gastric ulcer/diverticulosis/AVM in 2017, gastric AVM s/p endoscopic therapy in 05/2016, recurrent GIB 06/2016 with AVM s/p therapy on endoscopy.  . Hyperlipidemia   . Lymphedema    a. in general, her LE edema appears to be a poor indicator of her total fluid status - chronic edema.  . Obesity   . Renal artery stenosis ([facility])    Bilateral renal artery stents 2004  . Tobacco abuse, in remission    40 pack year stopped in 1989  . Varicosities of leg     Patient Active Problem List   Diagnosis Date Noted  . Urinary tract infection without hematuria   . GI bleed 12/08/2016  . Asymptomatic bacteriuria 12/08/2016  . Lymphedema 11/14/2016  . CKD (chronic kidney disease) stage 3, GFR 30-59  ml/min 09/05/2016  . Hypokalemia 09/05/2016  . CHF exacerbation ([place]) 08/25/2016  . Difficulty in walking, not elsewhere classified   . Dizziness and giddiness   . Gastrointestinal hemorrhage associated with peptic ulcer   . Renal impairment   . Acute blood loss anemia 05/22/2016  . Chronic blood loss anemia 05/22/2016  . Chronic diastolic CHF (congestive heart failure) ([place]) 05/22/2016  . Acute renal failure superimposed on stage 3 chronic kidney disease ([place]) 05/22/2016  . Anticoagulated on Coumadin   . Symptomatic anemia 07/11/2015  . Microcytic anemia 07/11/2015  . Fatigue 07/11/2015  . Occlusion and stenosis of carotid artery without mention of cerebral infarction-Bilateral 11/11/2013  . Aftercare following surgery of the circulatory system, [place] 11/11/2013  . Varicose veins of bilateral lower extremities with other complications 03/54/6568  . Encounter for therapeutic drug monitoring 05/18/2013  . Fasting hyperglycemia 07/03/2012  . Chronic anticoagulation   . Tobacco abuse, in remission   . Carotid artery occlusion   . Hypertension   . Obesity   . Hyperlipidemia   . Chronic venous insufficiency 07/26/2010  . Chronic atrial fibrillation ([place]) 04/05/2010  . Coronary atherosclerosis of native coronary artery 04/05/2010  . PERIPHERAL VASCULAR DISEASE 04/05/2010    Past Surgical History:  Procedure Laterality Date  . APPENDECTOMY    . CAROTID ENDARTERECTOMY     Right in 2005, left in 2010  . COLONOSCOPY N/A 08/19/2015  Procedure: COLONOSCOPY;  Surgeon: Rogene Houston, MD;  [facility];  Service: Endoscopy;  Laterality: N/A;  7:30  . CORONARY ANGIOPLASTY    . ESOPHAGOGASTRODUODENOSCOPY N/A 08/19/2015   Procedure: ESOPHAGOGASTRODUODENOSCOPY (EGD);  Surgeon: Rogene Houston, MD;  [facility];  Service: Endoscopy;  Laterality: N/A;  . ESOPHAGOGASTRODUODENOSCOPY N/A 05/24/2016   Procedure: ESOPHAGOGASTRODUODENOSCOPY (EGD);  Surgeon: Rogene Houston, MD;   [facility];  Service: Endoscopy;  Laterality: N/A;  . ESOPHAGOGASTRODUODENOSCOPY N/A 06/19/2016   Procedure: ESOPHAGOGASTRODUODENOSCOPY (EGD);  Surgeon: Rogene Houston, MD;  [facility];  Service: Endoscopy;  Laterality: N/A;  . ESOPHAGOGASTRODUODENOSCOPY N/A 09/05/2016   Procedure: ESOPHAGOGASTRODUODENOSCOPY (EGD);  Surgeon: Rogene Houston, MD;  [facility];  Service: Endoscopy;  Laterality: N/A;  . GIVENS CAPSULE STUDY N/A 06/20/2016   Procedure: GIVENS CAPSULE STUDY;  Surgeon: Rogene Houston, MD;  [facility];  Service: Endoscopy;  Laterality: N/A;    Current Outpatient Rx  . Order #: [id number] Class: Historical Med  . Order #: [id number] Class: Normal  . Order #: [id number] Class: Normal  . Order #: [id number] Class: No Print  . Order #: [id number] Class: Normal  . Order #: [id number] Class: Historical Med  . Order #: [id number] Class: Normal  . Order #: [id number] Class: Normal  . Order #: [id number] Class: Normal  . Order #: [id number] Class: Normal    Allergies Diltiazem  Family History  Problem Relation Age of Onset  . Lung cancer Mother   . Cancer Mother   . Hypertension Daughter   . Hyperlipidemia Daughter   . Hypertension Son   . Hypertension Daughter   . Heart disease Maternal Grandmother     Social History Social History  Substance Use Topics  . Smoking status: Former Smoker    Types: Cigarettes    Quit date: 04/30/1987  . Smokeless tobacco: Never Used  . Alcohol use No    Review of Systems  Constitutional: No fever/chills Eyes: No visual changes. ENT: No sore throat. Cardiovascular: Denies chest pain. Respiratory: Denies shortness of breath. Gastrointestinal: No abdominal pain.  No nausea, no vomiting.  No diarrhea.  No constipation. Genitourinary: Negative for dysuria. Musculoskeletal: Negative for back pain. Positive worsening lower extremity edema.  Skin: Negative for rash. Neurological: Negative for headaches,  focal weakness or numbness.  10-point ROS otherwise negative.  ____________________________________________   PHYSICAL EXAM:  VITAL SIGNS: ED Triage Vitals  Enc Vitals Group     BP 12/18/16 1415 (!) 117/28     Pulse Rate 12/18/16 1415 66     Resp 12/18/16 1415 20     Temp 12/18/16 1415 97.6 F (36.4 C)     Temp Source 12/18/16 1415 Temporal     SpO2 12/18/16 1415 99 %     Weight 12/18/16 1414 174 lb (78.9 kg)     Pain Score 12/18/16 1415 0   Constitutional: Alert and oriented. Well appearing and in no acute distress. Eyes: Conjunctivae are normal.  Head: Atraumatic. Nose: No congestion/rhinnorhea. Mouth/Throat: Mucous membranes are moist.  Neck: No stridor.   Cardiovascular: Normal rate, regular rhythm. Good peripheral circulation. Grossly normal heart sounds.   Respiratory: Normal respiratory effort.  No retractions. Lungs CTAB. Gastrointestinal: Soft and nontender. No distention.  Musculoskeletal: Bilateral 4+ pitting edema in LEs. Tenderness to palpation with some weeping. No overlying cellulitis. No gross deformities of extremities. Neurologic:  Normal speech and language. No gross focal neurologic deficits are appreciated.  Skin:  Skin is warm, dry and intact. Chronic  stasis dermatitis in B/L LEs.   ____________________________________________   LABS (all labs ordered are listed, but only abnormal results are displayed)  Labs Reviewed  CBC WITH DIFFERENTIAL/PLATELET - Abnormal; Notable for the following:       Result Value   RBC 3.85 (*)    Hemoglobin 9.3 (*)    HCT 30.2 (*)    MCH 24.2 (*)    RDW 16.9 (*)    Neutro Abs 8.7 (*)    All other components within normal limits  COMPREHENSIVE METABOLIC PANEL - Abnormal; Notable for the following:    Sodium 133 (*)    Potassium 2.3 (*)    Chloride 94 (*)    Glucose, Bld 163 (*)    Creatinine, Ser 1.35 (*)    Calcium 8.6 (*)    Albumin 3.4 (*)    AST 48 (*)    GFR calc non Af Amer 35 (*)    GFR calc Af Amer 41  (*)    All other components within normal limits  BRAIN NATRIURETIC PEPTIDE - Abnormal; Notable for the following:    B Natriuretic Peptide 538.0 (*)    All other components within normal limits  PROTIME-INR - Abnormal; Notable for the following:    Prothrombin Time 28.2 (*)    All other components within normal limits  I-STAT TROPONIN, ED   ____________________________________________  EKG  Reviewed in MUSE. No STEMI. Similar to prior.  ____________________________________________  [id number]  Dg Chest 2 View  Result Date: 12/18/2016 CLINICAL DATA:  Lower extremity pain and swelling. 20 pound weight gain EXAM: CHEST  2 VIEW COMPARISON:  12/08/2016 FINDINGS: Chronic cardiomegaly. Stable mild aortic tortuosity. Negative hila. Chronic mild reticulation of lower lung markings. There is no edema, consolidation, effusion, or pneumothorax. No acute osseous finding. IMPRESSION: 1. Stable from prior.  No evidence of acute disease. 2. Cardiomegaly. Electronically Signed   By: Monte Fantasia M.D.   On: 12/18/2016 16:08    ____________________________________________   PROCEDURES  Procedure(s) performed:   Procedures  None ____________________________________________   INITIAL IMPRESSION / ASSESSMENT AND PLAN / ED COURSE  Pertinent labs & imaging results that were available during my care of the patient were reviewed by me and considered in my medical decision making (see chart for details).  Patient presents to the emergency department for evaluation of unintentional weight gain in the setting of attempted PO diuresis at home. Worsening renal function on labs from 8/31. Today creatinine improved but patient with hypokalemia. Plan for PO repletion, IV diuresis, and discussion with Cardiology regarding admission.   Discussed patient's case with Hospitalist, Dr. Marin Comment to request admission. Patient and family (if present) updated with plan. Care transferred to Hospitalist service.  I  reviewed all nursing notes, vitals, pertinent old records, EKGs, labs, imaging (as available).  ____________________________________________  FINAL CLINICAL IMPRESSION(S) / ED DIAGNOSES  Final diagnoses:  Peripheral edema  Hypokalemia     MEDICATIONS GIVEN DURING THIS VISIT:  Medications  furosemide (LASIX) injection 80 mg (80 mg Intravenous Given 12/18/16 1638)  potassium chloride SA (K-DUR,KLOR-CON) CR tablet 40 mEq (40 mEq Oral Given 12/18/16 1639)     NEW OUTPATIENT MEDICATIONS STARTED DURING THIS VISIT:  None   Note:  This document was prepared using Dragon voice recognition software and may include unintentional dictation errors.  Nanda Quinton, MD Emergency Medicine    Long, [facility], MD 12/19/16 [phone]

## 2016-12-18 NOTE — Progress Notes (Addendum)
Cardiology Office Note   Date:  12/18/2016   ID:  [name], DOB [birth year], MRN [id number]  PCP:  [name], MD  Cardiologist:  [name] Chief Complaint  Patient presents with  . Congestive Heart Failure     History of Present Illness: [name] is a 83 y.o. female who presents for ongoing assessment and management of chronic atrial fib, CHADS VASC Score of 5, presses on Coumadin followed by [place] [facility]) HTN,recent hospitalization in the setting of anemia, UTI. Patient comes today with complaints of LEE.  She comes today having gained 20 pounds. Legs are extremely swollen and edematous to the mid thighs. She had recently reduced her metolazone to 5 mg once a week due to worsening kidney function. She denies increased salt intake. The patient states she feels miserable, the weight in her legs  Makes it hard to walk, she also has symptoms of early satiety.    Past Medical History:  Diagnosis Date  . Angiodysplasia of stomach and duodenum   . Carotid artery disease ([place])    RIght CEA 05/2004  . Chronic anticoagulation   . Chronic atrial fibrillation ([place])   . Chronic blood loss anemia   . Chronic diastolic CHF (congestive heart failure) ([place])   . Coronary atherosclerosis of native coronary artery    a. RCA stent 2003 with 60% LCx, 60% OM1, 80% OM2 disease at that time.  . Diverticulosis   . Essential hypertension, benign   . Gastric ulcer   . GI bleeding    Recurrent GIB (prior gastric ulcer/diverticulosis/AVM in 2017, gastric AVM s/p endoscopic therapy in 05/2016, recurrent GIB 06/2016 with AVM s/p therapy on endoscopy.  . Hyperlipidemia   . Lymphedema    a. in general, her LE edema appears to be a poor indicator of her total fluid status - chronic edema.  . Obesity   . Renal artery stenosis ([facility])    Bilateral renal artery stents 2004  . Tobacco abuse, in remission    40 pack year stopped in 1989  . Varicosities of leg     Past Surgical History:    Procedure Laterality Date  . APPENDECTOMY    . CAROTID ENDARTERECTOMY     Right in 2005, left in 2010  . COLONOSCOPY N/A 08/19/2015   Procedure: COLONOSCOPY;  Surgeon: Rogene Houston, MD;  [facility];  Service: Endoscopy;  Laterality: N/A;  7:30  . CORONARY ANGIOPLASTY    . ESOPHAGOGASTRODUODENOSCOPY N/A 08/19/2015   Procedure: ESOPHAGOGASTRODUODENOSCOPY (EGD);  Surgeon: Rogene Houston, MD;  [facility];  Service: Endoscopy;  Laterality: N/A;  . ESOPHAGOGASTRODUODENOSCOPY N/A 05/24/2016   Procedure: ESOPHAGOGASTRODUODENOSCOPY (EGD);  Surgeon: Rogene Houston, MD;  [facility];  Service: Endoscopy;  Laterality: N/A;  . ESOPHAGOGASTRODUODENOSCOPY N/A 06/19/2016   Procedure: ESOPHAGOGASTRODUODENOSCOPY (EGD);  Surgeon: Rogene Houston, MD;  [facility];  Service: Endoscopy;  Laterality: N/A;  . ESOPHAGOGASTRODUODENOSCOPY N/A 09/05/2016   Procedure: ESOPHAGOGASTRODUODENOSCOPY (EGD);  Surgeon: Rogene Houston, MD;  [facility];  Service: Endoscopy;  Laterality: N/A;  . GIVENS CAPSULE STUDY N/A 06/20/2016   Procedure: GIVENS CAPSULE STUDY;  Surgeon: Rogene Houston, MD;  [facility];  Service: Endoscopy;  Laterality: N/A;     Current Outpatient Prescriptions  Medication Sig Dispense Refill  . acetaminophen (TYLENOL) 500 MG tablet Take 500 mg by mouth every 6 (six) hours as needed for headache.    Marland Kitchen atorvastatin (LIPITOR) 80 MG tablet  TAKE 1 TABLET EVERY NIGHT AT BEDTIME 60 tablet 6  . bisoprolol (ZEBETA) 5 MG tablet take 1 tablet by mouth twice a day 60 tablet 0  . metolazone (ZAROXOLYN) 5 MG tablet Take 1 tablet po once a week as needed for swelling 20 tablet 6  . pantoprazole (PROTONIX) 40 MG tablet Take 1 tablet (40 mg total) by mouth 2 (two) times daily before a meal. 60 tablet 0  . Pediatric Multivitamins-Iron (FLINTSTONES PLUS IRON PO) Take 1 tablet by mouth 2 (two) times daily.     . potassium chloride SA (K-DUR,KLOR-CON)  20 MEQ tablet Take 40 meq (2 tablets) in the am , and take 20 meq (1 tablet) in the pm (Patient taking differently: Take 20-40 mEq by mouth See admin instructions. Take 40 meq (2 tablets) in the am , and take 20 meq (1 tablet) in the pm) 90 tablet 3  . torsemide (DEMADEX) 20 MG tablet Take 4 tablets (80 mg total) by mouth 2 (two) times daily. 240 tablet 3  . verapamil (CALAN) 80 MG tablet Take 1 tablet (80 mg total) by mouth 2 (two) times daily. 60 tablet 0  . warfarin (COUMADIN) 5 MG tablet Take 1 tablet daily except 1/2 tablet on Tuesdays and Saturdays or as directed by Coumadin Clinic (Patient taking differently: Take 2.5-5 mg by mouth daily. Takes 2.5mg  on Thursdays only) 40 tablet 4   No current facility-administered medications for this visit.     Allergies:   Diltiazem    Social History:  The patient  reports that she quit smoking about 29 years ago. Her smoking use included Cigarettes. She has never used smokeless tobacco. She reports that she does not drink alcohol or use drugs.   Family History:  The patient's family history includes Cancer in her mother; Heart disease in her maternal grandmother; Hyperlipidemia in her daughter; Hypertension in her daughter, daughter, and son; Lung cancer in her mother.    ROS: All other systems are reviewed and negative. Unless otherwise mentioned in H&P    PHYSICAL EXAM: VS:  BP 106/62 (BP Location: Left Arm)   Pulse 77   Ht 5' (1.524 m)   Wt 173 lb (78.5 kg)   SpO2 97%   BMI 33.79 kg/m  , BMI Body mass index is 33.79 kg/m. GEN: Well nourished, well developed, in no acute distress  HEENT: normal  Neck: no JVD, carotid bruits, or masses Cardiac: RRR; no murmurs, rubs, or gallops significant 2+, 3+ edema into the upper thighs bilaterally unable to palpate pulses dorsalis pedis due to fluid accumulation. Respiratory:  clear to auscultation bilaterally, normal work of breathing GI: soft, nontender, mildly distended + BS MS: no deformity or  atrophy significant varicosities. Skin: warm and dry, no rash Neuro:  Strength and sensation are intact Psych: euthymic mood, full affect   Recent Labs: 05/09/2016: TSH 1.76 08/25/2016: B Natriuretic Peptide 438.0 09/06/2016: Magnesium 1.9 12/14/2016: ALT 37; BUN 36; Creatinine, Ser 2.20; Hemoglobin 9.1; Platelets 189; Potassium 5.1; Sodium 131    Lipid Panel    Component Value Date/Time   CHOL 91 (L) 12/22/2014 1035   TRIG 79 12/22/2014 1035   HDL 34 (L) 12/22/2014 1035   CHOLHDL 2.7 12/22/2014 1035   VLDL 16 12/22/2014 1035   LDLCALC 41 12/22/2014 1035      Wt Readings from Last 3 Encounters:  12/18/16 173 lb (78.5 kg)  12/14/16 171 lb (77.6 kg)  12/13/16 171 lb (77.6 kg)      Other  studies Reviewed: Echocardiogram 09/11/2016 Left ventricle: The cavity size was normal. Wall thickness was   increased in a pattern of mild LVH. Systolic function was normal.   The estimated ejection fraction was 60%. Wall motion was normal;   there were no regional wall motion abnormalities. The study is   not technically sufficient to allow evaluation of LV diastolic   function. - Aortic valve: Mildly to moderately calcified annulus. Trileaflet;   mildly calcified leaflets. There was no stenosis. - Mitral valve: Mildly thickened leaflets . There was mild   regurgitation. - Left atrium: The atrium was moderately dilated. - Right ventricle: The cavity size was mildly dilated. Systolic   function was mildly reduced. TAPSE: 14.3 mm . - Right atrium: The atrium was severely dilated. - Atrial septum: No defect or patent foramen ovale was identified. - Tricuspid valve: There was moderate-severe regurgitation. - Pulmonary arteries: PA peak pressure: 41 mm Hg (S). - Inferior vena cava: The vessel was dilated. The respirophasic   diameter changes were blunted (< 50%), consistent with elevated   central venous pressure. Estimated CVP 15 mmHg.  ASSESSMENT AND PLAN:  1. Acute on chronic diastolic  heart failure: She has gained 20 pounds in approximately one month. She has significant bilateral lower extremity edema up into the middle thighs. She had reduced her metolazone to once a week on Wednesdays per instruction due to elevated creatinine. I believe the patient should be hospitalized for IV diuresis as I am not certain about the bioavailability and absorption of current diuretic dosing along with once a week metolazone. She adamantly refuses hospitalization. I have reviewed recent labs, these were completed on 12/14/2016.  Sodium 131, potassium 5.1, chloride 98, CO2 33, glucose 101, BUN 36, creatinine 2.20.  After long discussion, the patient continued to refuse hospitalization, but after thinking about it and ready to check out with our RN, she has finally agreed to be admitted to the hospital for IV diuresis and close monitoring of kidney status. She will be seen in the emergency room where they will begin IV diuresis there so that she will not have to wait any longer for diuresis as she awaits the bed.  2. Hypertension: Blood pressure is soft and I'm concerned about this concerning diuresis. This will need to be monitored closely.  3. Chronic atrial fibrillation: Heart rate is well controlled on verapamil. Calcium channel blocker can be contributing to lower extremity edema but not to this extent. She will remain on Coumadin therapy as directed. She is followed in our [place] office.  4, Bilateral cataracts: The patient has been unable to drive due to vision impairment. She was planned to have cataract surgery completed at the end of the week. She asked me if she could still have this completed. I've advised against this as if she continues to diuresis with her eyesight diminished, and usually covered one eye post surgery, I a.m. concerned about safety issues until she is fully diuresed. Especially now that she is on Coumadin. I do not wish her to fall and injure herself due to impaired  vision postoperatively.   Current medicines are reviewed at length with the patient today.    Labs/ tests ordered today include: BMET Phill Myron. [place] Pugh, ANP, AACC   12/18/2016 1:56 PM    [facility] [address], [place],  [address] Phone: [phone]; Fax: [phone]

## 2016-12-18 NOTE — Patient Instructions (Addendum)
Keep Sept 12, 2018 apt  At 11:20 am with Dr.McDowell   Return for nurse visit on Thursday, Sept 6 th at 12:30 pm IF your weight is unchanged, Dr.Lawrence plans to admit you.Keep an over nite bag in the car.   Take Metolazone 5 mg today, tomorrow, and Thursday    Get lab work before nurse visit: BMET         Thank you for choosing [facility] !       Addendum:  Pt admitted to hospital

## 2016-12-18 NOTE — Progress Notes (Signed)
Paged MD with critical lab value of K+ of 2.7.  Also pharmacy inquired about patient's dose for tomorrow, as patient takes 40 of potassium in the AM and 20 in the PM regularly.

## 2016-12-18 NOTE — ED Triage Notes (Signed)
Pt was brought here from cardiac rehab for leg swelling and a 20 lb weight gain. Pt denies any pain.

## 2016-12-18 NOTE — Progress Notes (Signed)
ANTICOAGULATION CONSULT NOTE - Initial Consult  Pharmacy Consult for coumadin Indication: atrial fibrillation  Allergies  Allergen Reactions  . Diltiazem Other (See Comments)    Edema    Patient Measurements: Height: 5' (152.4 cm) Weight: 172 lb 4.8 oz (78.2 kg) IBW/kg (Calculated) : 45.5   Vital Signs: Temp: 98.3 F (36.8 C) (09/04 2020) Temp Source: Oral (09/04 2020) BP: 121/50 (09/04 2020) Pulse Rate: 72 (09/04 2020)  Labs:  Recent Labs  12/18/16 1437 12/18/16 1440  HGB 9.3*  --   HCT 30.2*  --   PLT 203  --   LABPROT  --  28.2*  INR  --  2.67  CREATININE 1.35*  --     Estimated Creatinine Clearance: 29.7 mL/min (A) (by C-G formula based on SCr of 1.35 mg/dL (H)).   Medical History: Past Medical History:  Diagnosis Date  . Angiodysplasia of stomach and duodenum   . Carotid artery disease ([place])    RIght CEA 05/2004  . Chronic anticoagulation   . Chronic atrial fibrillation ([place])   . Chronic blood loss anemia   . Chronic diastolic CHF (congestive heart failure) ([place])   . Coronary atherosclerosis of native coronary artery    a. RCA stent 2003 with 60% LCx, 60% OM1, 80% OM2 disease at that time.  . Diverticulosis   . Essential hypertension, benign   . Gastric ulcer   . GI bleeding    Recurrent GIB (prior gastric ulcer/diverticulosis/AVM in 2017, gastric AVM s/p endoscopic therapy in 05/2016, recurrent GIB 06/2016 with AVM s/p therapy on endoscopy.  . Hyperlipidemia   . Lymphedema    a. in general, her LE edema appears to be a poor indicator of her total fluid status - chronic edema.  . Obesity   . Renal artery stenosis ([facility])    Bilateral renal artery stents 2004  . Tobacco abuse, in remission    40 pack year stopped in 1989  . Varicosities of leg     Medications:  Prescriptions Prior to Admission  Medication Sig Dispense Refill Last Dose  . acetaminophen (TYLENOL) 500 MG tablet Take 500 mg by mouth every 6 (six) hours as needed for headache.    unknown  . atorvastatin (LIPITOR) 80 MG tablet TAKE 1 TABLET EVERY NIGHT AT BEDTIME 60 tablet 6 12/17/2016 at Unknown time  . bisoprolol (ZEBETA) 5 MG tablet take 1 tablet by mouth twice a day 60 tablet 0 12/18/2016 at 0900  . metolazone (ZAROXOLYN) 5 MG tablet Take 1 tablet po once a week as needed for swelling 20 tablet 6 12/18/2016 at Unknown time  . pantoprazole (PROTONIX) 40 MG tablet Take 1 tablet (40 mg total) by mouth 2 (two) times daily before a meal. 60 tablet 0 12/18/2016 at Unknown time  . Pediatric Multivitamins-Iron (FLINTSTONES PLUS IRON PO) Take 1 tablet by mouth 2 (two) times daily.    12/18/2016 at Unknown time  . potassium chloride SA (K-DUR,KLOR-CON) 20 MEQ tablet Take 40 meq (2 tablets) in the am , and take 20 meq (1 tablet) in the pm (Patient taking differently: Take 20-40 mEq by mouth See admin instructions. Take 40 meq (2 tablets) in the am , and take 20 meq (1 tablet) in the pm) 90 tablet 3 12/18/2016 at Unknown time  . torsemide (DEMADEX) 20 MG tablet Take 4 tablets (80 mg total) by mouth 2 (two) times daily. 240 tablet 3 12/18/2016 at Unknown time  . verapamil (CALAN) 80 MG tablet Take 1 tablet (80 mg total)  by mouth 2 (two) times daily. 60 tablet 0 12/18/2016 at Unknown time  . warfarin (COUMADIN) 5 MG tablet Take 1 tablet daily except 1/2 tablet on Tuesdays and Saturdays or as directed by Coumadin Clinic (Patient taking differently: Take 2.5-5 mg by mouth daily. Takes 5 mg on every day except on Tuesdays and Saturday take 2.5 mg.) 40 tablet 4 12/18/2016 at 0900    Assessment: 83 yo lady to continue coumadin for afib.  Her INR is therapeutic and she has taken dose for today Goal of Therapy:  INR 2-3 Monitor platelets by anticoagulation protocol: Yes   Plan:  Daily PT/INR Monitor for bleeding complications  Mahitha Hickling [place] 12/18/2016,8:30 PM

## 2016-12-18 NOTE — H&P (Signed)
History and Physical    [name] XVQ:[id number] DOB: [birth year] DOA: 12/18/2016  PCP: [name], MD  Patient coming from: Home.    Chief Complaint: sent in by Hildred Priest, NP for IV diuresis from cardiology clinic.   HPI: [name] is an 83 y.o. female with hx of diastolic CHF, last ECHO May 2018 showing EF 60%, with moderate to severe TR, afib on therapeutic Coumadin, anemia, UTI, HTN, recurrent GI Bleed, HLD, chronic lymphedema, sent in from cardiology clinic as she has gained weight, having severe bilateral lower leg edema.  She was not taking her diuresis, as she had elevated her Cr, but since, it has normalized.  She was recommended IV diuresis, but at first refused to be admitted.  Work up included a K of 2.3 mE/L, and no leukocytosis with Cr of 1.3, and her CXR did not show any vascular congestion.  She was given 80mg  of IV Lasix, and hospitalist was asked to admit her per Ms Purcell Nails 's recommendation.  She denied CP, SOB, fever, chills or coughs.    ED Course:  See above.  Rewiew of Systems:  Constitutional: Negative for malaise, fever and chills. No significant weight loss or weight gain Eyes: Negative for eye pain, redness and discharge, diplopia, visual changes, or flashes of light. ENMT: Negative for ear pain, hoarseness, nasal congestion, sinus pressure and sore throat. No headaches; tinnitus, drooling, or problem swallowing. Cardiovascular: Negative for chest pain, palpitations, diaphoresis, dyspnea ; No orthopnea, PND Respiratory: Negative for cough, hemoptysis, wheezing and stridor. No pleuritic chestpain. Gastrointestinal: Negative for diarrhea, constipation,  melena, blood in stool, hematemesis, jaundice and rectal bleeding.    Genitourinary: Negative for frequency, dysuria, incontinence,flank pain and hematuria; Musculoskeletal: Negative for back pain and neck pain. Negative for swelling and trauma.;  Skin: . Negative for pruritus, rash,  abrasions, bruising and skin lesion.; ulcerations Neuro: Negative for headache, lightheadedness and neck stiffness. Negative for weakness, altered level of consciousness , altered mental status, extremity weakness, burning feet, involuntary movement, seizure and syncope.  Psych: negative for anxiety, depression, insomnia, tearfulness, panic attacks, hallucinations, paranoia, suicidal or homicidal ideation   Past Medical History:  Diagnosis Date  . Angiodysplasia of stomach and duodenum   . Carotid artery disease ([place])    RIght CEA 05/2004  . Chronic anticoagulation   . Chronic atrial fibrillation ([place])   . Chronic blood loss anemia   . Chronic diastolic CHF (congestive heart failure) ([place])   . Coronary atherosclerosis of native coronary artery    a. RCA stent 2003 with 60% LCx, 60% OM1, 80% OM2 disease at that time.  . Diverticulosis   . Essential hypertension, benign   . Gastric ulcer   . GI bleeding    Recurrent GIB (prior gastric ulcer/diverticulosis/AVM in 2017, gastric AVM s/p endoscopic therapy in 05/2016, recurrent GIB 06/2016 with AVM s/p therapy on endoscopy.  . Hyperlipidemia   . Lymphedema    a. in general, her Nahom Carfagno edema appears to be a poor indicator of her total fluid status - chronic edema.  . Obesity   . Renal artery stenosis (HCC)    Bilateral renal artery stents 2004  . Tobacco abuse, in remission    40 pack year stopped in 1989  . Varicosities of leg    Past Surgical History:  Procedure Laterality Date  . APPENDECTOMY    . CAROTID ENDARTERECTOMY     Right in 2005, left in 2010  . COLONOSCOPY N/A 08/19/2015   Procedure: COLONOSCOPY;  Surgeon: Rogene Houston, MD;  [facility];  Service: Endoscopy;  Laterality: N/A;  7:30  . CORONARY ANGIOPLASTY    . ESOPHAGOGASTRODUODENOSCOPY N/A 08/19/2015   Procedure: ESOPHAGOGASTRODUODENOSCOPY (EGD);  Surgeon: Rogene Houston, MD;  [facility];  Service: Endoscopy;  Laterality: N/A;  .  ESOPHAGOGASTRODUODENOSCOPY N/A 05/24/2016   Procedure: ESOPHAGOGASTRODUODENOSCOPY (EGD);  Surgeon: Rogene Houston, MD;  [facility];  Service: Endoscopy;  Laterality: N/A;  . ESOPHAGOGASTRODUODENOSCOPY N/A 06/19/2016   Procedure: ESOPHAGOGASTRODUODENOSCOPY (EGD);  Surgeon: Rogene Houston, MD;  [facility];  Service: Endoscopy;  Laterality: N/A;  . ESOPHAGOGASTRODUODENOSCOPY N/A 09/05/2016   Procedure: ESOPHAGOGASTRODUODENOSCOPY (EGD);  Surgeon: Rogene Houston, MD;  [facility];  Service: Endoscopy;  Laterality: N/A;  . GIVENS CAPSULE STUDY N/A 06/20/2016   Procedure: GIVENS CAPSULE STUDY;  Surgeon: Rogene Houston, MD;  [facility];  Service: Endoscopy;  Laterality: N/A;     reports that she quit smoking about 29 years ago. Her smoking use included Cigarettes. She has never used smokeless tobacco. She reports that she does not drink alcohol or use drugs.  Allergies  Allergen Reactions  . Diltiazem Other (See Comments)    Edema    Family History  Problem Relation Age of Onset  . Lung cancer Mother   . Cancer Mother   . Hypertension Daughter   . Hyperlipidemia Daughter   . Hypertension Son   . Hypertension Daughter   . Heart disease Maternal Grandmother      Prior to Admission medications   Medication Sig Start Date End Date Taking? Authorizing Provider  acetaminophen (TYLENOL) 500 MG tablet Take 500 mg by mouth every 6 (six) hours as needed for headache.   Yes [provider]  atorvastatin (LIPITOR) 80 MG tablet TAKE 1 TABLET EVERY NIGHT AT BEDTIME 05/07/16  Yes Satira Sark, MD  bisoprolol (ZEBETA) 5 MG tablet take 1 tablet by mouth twice a day 12/06/16  Yes Satira Sark, MD  metolazone (ZAROXOLYN) 5 MG tablet Take 1 tablet po once a week as needed for swelling 12/11/16  Yes Kathie Dike, MD  pantoprazole (PROTONIX) 40 MG tablet Take 1 tablet (40 mg total) by mouth 2 (two) times daily before a meal. 10/12/16  Yes  Setzer, Rona Ravens, NP  Pediatric Multivitamins-Iron (FLINTSTONES PLUS IRON PO) Take 1 tablet by mouth 2 (two) times daily.    Yes [provider]  potassium chloride SA (K-DUR,KLOR-CON) 20 MEQ tablet Take 40 meq (2 tablets) in the am , and take 20 meq (1 tablet) in the pm Patient taking differently: Take 20-40 mEq by mouth See admin instructions. Take 40 meq (2 tablets) in the am , and take 20 meq (1 tablet) in the pm 11/01/16  Yes Satira Sark, MD  torsemide (DEMADEX) 20 MG tablet Take 4 tablets (80 mg total) by mouth 2 (two) times daily. 10/24/16  Yes Satira Sark, MD  verapamil (CALAN) 80 MG tablet Take 1 tablet (80 mg total) by mouth 2 (two) times daily. 12/11/16  Yes Kathie Dike, MD  warfarin (COUMADIN) 5 MG tablet Take 1 tablet daily except 1/2 tablet on Tuesdays and Saturdays or as directed by Coumadin Clinic Patient taking differently: Take 2.5-5 mg by mouth daily. Takes 5 mg on every day except on Tuesdays and Saturday take 2.5 mg. 07/19/16  Yes Satira Sark, MD    Physical Exam: Vitals:   12/18/16 1414 12/18/16 1415 12/18/16 1626  BP:  (!) 117/28 (!) 110/42  Pulse:  66 69  Resp:  20 18  Temp:  97.6 F (36.4 C)   TempSrc:  Temporal   SpO2:  99% 100%  Weight: 78.9 kg (174 lb)        Constitutional: NAD, calm, comfortable Vitals:   12/18/16 1414 12/18/16 1415 12/18/16 1626  BP:  (!) 117/28 (!) 110/42  Pulse:  66 69  Resp:  20 18  Temp:  97.6 F (36.4 C)   TempSrc:  Temporal   SpO2:  99% 100%  Weight: 78.9 kg (174 lb)     Eyes: PERRL, lids and conjunctivae normal ENMT: Mucous membranes are moist. Posterior pharynx clear of any exudate or lesions.Normal dentition.  Neck: normal, supple, no masses, no thyromegaly Respiratory: clear to auscultation bilaterally, no wheezing, no crackles. Normal respiratory effort. No accessory muscle use.  Cardiovascular: Regular rate and rhythm, no murmurs / rubs / gallops. She has significant edema bilaterally. 2+  pedal pulses. No carotid bruits.  Abdomen: no tenderness, no masses palpated. No hepatosplenomegaly. Bowel sounds positive.  Musculoskeletal: no clubbing / cyanosis. No joint deformity upper and lower extremities. Good ROM, no contractures. Normal muscle tone.  Skin: no rashes, lesions, ulcers. No induration Neurologic: CN 2-12 grossly intact. Sensation intact, DTR normal. Strength 5/5 in all 4.  Psychiatric: Normal judgment and insight. Alert and oriented x 3. Normal mood.    Labs on Admission: I have personally reviewed following labs and imaging studies  CBC:  Recent Labs Lab 12/13/16 0918 12/14/16 0748 12/14/16 0847 12/18/16 1437  WBC  --   --  11.7* 10.2  NEUTROABS  --   --  9.4* 8.7*  HGB 8.4* 6.5* 9.1* 9.3*  HCT  --   --  29.3* 30.2*  MCV  --   --  79.8 78.4  PLT  --   --  189 782   Basic Metabolic Panel:  Recent Labs Lab 12/14/16 0847 12/18/16 1437  NA 131* 133*  K 5.1 2.3*  CL 98* 94*  CO2 23 28  GLUCOSE 101* 163*  BUN 36* 19  CREATININE 2.20* 1.35*  CALCIUM 9.3 8.6*   GFR: Estimated Creatinine Clearance: 29.9 mL/min (A) (by C-G formula based on SCr of 1.35 mg/dL (H)). Liver Function Tests:  Recent Labs Lab 12/14/16 0847 12/18/16 1437  AST 57* 48*  ALT 37 45  ALKPHOS 98 108  BILITOT 1.8* 1.1  PROT 7.0 7.1  ALBUMIN 3.3* 3.4*   Coagulation Profile:  Recent Labs Lab 12/14/16 0847 12/18/16 1440  INR 2.06 2.67   Urine analysis:    Component Value Date/Time   COLORURINE YELLOW 12/08/2016 1539   APPEARANCEUR HAZY (A) 12/08/2016 1539   LABSPEC 1.008 12/08/2016 1539   PHURINE 7.0 12/08/2016 1539   GLUCOSEU NEGATIVE 12/08/2016 1539   HGBUR NEGATIVE 12/08/2016 1539   BILIRUBINUR NEGATIVE 12/08/2016 1539   KETONESUR NEGATIVE 12/08/2016 1539   PROTEINUR NEGATIVE 12/08/2016 1539   UROBILINOGEN 0.2 08/31/2009 1628   NITRITE NEGATIVE 12/08/2016 1539   LEUKOCYTESUR LARGE (A) 12/08/2016 1539    Radiological Exams on Admission: Dg Chest 2  View  Result Date: 12/18/2016 CLINICAL DATA:  Lower extremity pain and swelling. 20 pound weight gain EXAM: CHEST  2 VIEW COMPARISON:  12/08/2016 FINDINGS: Chronic cardiomegaly. Stable mild aortic tortuosity. Negative hila. Chronic mild reticulation of lower lung markings. There is no edema, consolidation, effusion, or pneumothorax. No acute osseous finding. IMPRESSION: 1. Stable from prior.  No evidence of acute disease. 2.  Cardiomegaly. Electronically Signed   By: Monte Fantasia M.D.   On: 12/18/2016 16:08    EKG: Independently reviewed.  Assessment/Plan Active Problems:   Chronic atrial fibrillation (HCC)   PERIPHERAL VASCULAR DISEASE   Chronic anticoagulation   Hypertension   Obesity   Hyperlipidemia   Renal impairment   CKD (chronic kidney disease) stage 3, GFR 30-59 ml/min   Lymphedema   CHF, acute on chronic (HCC)   PLAN:   Acute on chronic diastolic CHF:  She has no SOB and CXR is clear.  But I suspect she has volume excess, relatively well compensated.  Will give her IV Lasix drip as she will benefit with continual diuresis.   Follow Cr carefully, and she will need K supplement.  Given recent ECHO.  Will not repeat.   Hypokalemia:  Will give several runs of KCL.  She will need oral supplementation daily as well.  Check Magnesium level.  HLD:  Continue with lipitor.   CKD:  Her Cr has improved.  With diuresis, will need careful following of her GFR and K.  HTN:  BP is OK.    Afib:  Rate is control, and she is anticoagulated.  Will continue with Coumadin per pharmacy's dosing.    DVT prophylaxis: Coumadin.  Code Status: DNR.  Family Communication: None.  Disposition Plan: Home.  Consults called: None.  Admission status: inpatient.    Adel Neyer MD FACP. [facility]  If 7PM-7AM, please contact night-coverage [email or web address] Password [email or web address]  12/18/2016, 5:09 PM

## 2016-12-19 ENCOUNTER — Encounter (HOSPITAL_COMMUNITY): Payer: Self-pay

## 2016-12-19 DIAGNOSIS — I5033 Acute on chronic diastolic (congestive) heart failure: Secondary | ICD-10-CM

## 2016-12-19 DIAGNOSIS — N183 Chronic kidney disease, stage 3 (moderate): Secondary | ICD-10-CM

## 2016-12-19 DIAGNOSIS — E7800 Pure hypercholesterolemia, unspecified: Secondary | ICD-10-CM

## 2016-12-19 DIAGNOSIS — I10 Essential (primary) hypertension: Secondary | ICD-10-CM

## 2016-12-19 LAB — BASIC METABOLIC PANEL
ANION GAP: 11 (ref 5–15)
BUN: 16 mg/dL (ref 6–20)
CALCIUM: 8.5 mg/dL — AB (ref 8.9–10.3)
CO2: 27 mmol/L (ref 22–32)
Chloride: 95 mmol/L — ABNORMAL LOW (ref 101–111)
Creatinine, Ser: 1.11 mg/dL — ABNORMAL HIGH (ref 0.44–1.00)
GFR, EST AFRICAN AMERICAN: 52 mL/min — AB (ref >60)
GFR, EST NON AFRICAN AMERICAN: 45 mL/min — AB (ref >60)
Glucose, Bld: 91 mg/dL (ref 65–99)
Potassium: 3 mmol/L — ABNORMAL LOW (ref 3.5–5.1)
Sodium: 133 mmol/L — ABNORMAL LOW (ref 135–145)

## 2016-12-19 LAB — PROTIME-INR
INR: 2.52
PROTHROMBIN TIME: 27 s — AB (ref 11.4–15.2)

## 2016-12-19 MED ORDER — TORSEMIDE 20 MG PO TABS
80.0000 mg | ORAL_TABLET | Freq: Two times a day (BID) | ORAL | Status: DC
  Administered 2016-12-19 – 2016-12-20 (×2): 80 mg via ORAL
  Filled 2016-12-19 (×3): qty 4

## 2016-12-19 MED ORDER — POTASSIUM CHLORIDE CRYS ER 20 MEQ PO TBCR
40.0000 meq | EXTENDED_RELEASE_TABLET | Freq: Every morning | ORAL | Status: DC
  Administered 2016-12-20: 40 meq via ORAL
  Filled 2016-12-19: qty 2

## 2016-12-19 MED ORDER — WARFARIN - PHARMACIST DOSING INPATIENT: Freq: Every day | Status: DC

## 2016-12-19 MED ORDER — WARFARIN SODIUM 5 MG PO TABS
5.0000 mg | ORAL_TABLET | Freq: Once | ORAL | Status: AC
  Administered 2016-12-19: 5 mg via ORAL
  Filled 2016-12-19: qty 1

## 2016-12-19 MED ORDER — POTASSIUM CHLORIDE CRYS ER 20 MEQ PO TBCR
20.0000 meq | EXTENDED_RELEASE_TABLET | Freq: Every day | ORAL | Status: DC
  Administered 2016-12-19: 20 meq via ORAL
  Filled 2016-12-19: qty 1

## 2016-12-19 MED ORDER — FUROSEMIDE 10 MG/ML IJ SOLN
INTRAMUSCULAR | Status: AC
  Filled 2016-12-19: qty 30

## 2016-12-19 NOTE — Progress Notes (Signed)
PROGRESS NOTE    [name]  CVE:[id number] DOB: May 23, 1932 DOA: 12/18/2016 PCP: [name], MD     Brief Narrative:  83 year old woman admitted to the hospital on 9/4 directly from the cardiologist's office due to concerns for CHF exacerbation.   Assessment & Plan:   Active Problems:   Chronic atrial fibrillation (HCC)   PERIPHERAL VASCULAR DISEASE   Chronic anticoagulation   Hypertension   Obesity   Hyperlipidemia   Renal impairment   CKD (chronic kidney disease) stage 3, GFR 30-59 ml/min   Lymphedema   CHF, acute on chronic (HCC)   Acute diastolic CHF -Echocardiogram from May 2018 with normal ejection fraction of 60%. -Patient placed on Lasix drip on admission, she has diuresed approximately 2.2 L. Volume status is much improved. -We will transition back to home torsemide dose and keep in the hospital overnight to make sure that she can sustain adequate diuresis and if such can possibly discharge home over the next 24 hours.  Hypokalemia -Potassium is 3.0 today, continue to replace orally. -Magnesium was 1.6, given IV mag yesterday.  Hyperlipidemia -Continue atorvastatin  Atrial fibrillation .Rate controlled, anticoagulated on Coumadin.  Hypertension -Well-controlled, continue current medications.  Chronic kidney disease stage III -Creatinine remains at baseline of around 1.1-1.3.   DVT prophylaxis: Coumadin  Code Status: Full code  Family Communication: Daughter at bedside  Disposition Plan: Anticipate discharge home over next 24-48 hours  Consultants:  None  Procedures:  None  Antimicrobials:  Anti-infectives    None       Subjective: Is much better, less short of breath, states her swelling has decreased significantly, wants to be discharged home today.   Objective: Vitals:   12/18/16 2109 12/19/16 0500 12/19/16 0655 12/19/16 1449  BP:   (!) 108/44   Pulse:   83   Resp:   18   Temp:   98.5 F (36.9 C)   TempSrc:   Oral     SpO2: 98%  94% 92%  Weight:  80 kg (176 lb 6 oz)    Height:        Intake/Output Summary (Last 24 hours) at 12/19/16 1858 Last data filed at 12/19/16 1700  Gross per 24 hour  Intake              240 ml  Output             1325 ml  Net            -1085 ml   Filed Weights   12/18/16 1414 12/18/16 2020 12/19/16 0500  Weight: 78.9 kg (174 lb) 78.2 kg (172 lb 4.8 oz) 80 kg (176 lb 6 oz)    Examination:  General exam: Alert, awake, oriented x 3 Respiratory system: Clear to auscultation. Respiratory effort normal. Cardiovascular system:RRR. No murmurs, rubs, gallops. Gastrointestinal system: Abdomen is nondistended, soft and nontender. No organomegaly or masses felt. Normal bowel sounds heard. Central nervous system: Alert and oriented. No focal neurological deficits. Extremities: 1+ bilateral pitting edema, positive pedal pulses  Skin: No rashes, lesions or ulcers Psychiatry: Judgement and insight appear normal. Mood & affect appropriate.     Data Reviewed: I have personally reviewed following labs and imaging studies  CBC:  Recent Labs Lab 12/13/16 0918 12/14/16 0748 12/14/16 0847 12/18/16 1437  WBC  --   --  11.7* 10.2  NEUTROABS  --   --  9.4* 8.7*  HGB 8.4* 6.5* 9.1* 9.3*  HCT  --   --  29.3* 30.2*  MCV  --   --  79.8 78.4  PLT  --   --  189 983   Basic Metabolic Panel:  Recent Labs Lab 12/14/16 0847 12/18/16 1437 12/18/16 2224 12/19/16 0714  NA 131* 133* 132* 133*  K 5.1 2.3* 2.7* 3.0*  CL 98* 94* 94* 95*  CO2 23 28 29 27   GLUCOSE 101* 163* 102* 91  BUN 36* 19 18 16   CREATININE 2.20* 1.35* 1.13* 1.11*  CALCIUM 9.3 8.6* 8.1* 8.5*  MG  --  1.6*  --   --    GFR: Estimated Creatinine Clearance: 36.6 mL/min (A) (by C-G formula based on SCr of 1.11 mg/dL (H)). Liver Function Tests:  Recent Labs Lab 12/14/16 0847 12/18/16 1437  AST 57* 48*  ALT 37 45  ALKPHOS 98 108  BILITOT 1.8* 1.1  PROT 7.0 7.1  ALBUMIN 3.3* 3.4*   No results for input(s):  LIPASE, AMYLASE in the last 168 hours. No results for input(s): AMMONIA in the last 168 hours. Coagulation Profile:  Recent Labs Lab 12/14/16 0847 12/18/16 1440 12/19/16 0714  INR 2.06 2.67 2.52   Cardiac Enzymes: No results for input(s): CKTOTAL, CKMB, CKMBINDEX, TROPONINI in the last 168 hours. BNP (last 3 results) No results for input(s): PROBNP in the last 8760 hours. HbA1C: No results for input(s): HGBA1C in the last 72 hours. CBG: No results for input(s): GLUCAP in the last 168 hours. Lipid Profile: No results for input(s): CHOL, HDL, LDLCALC, TRIG, CHOLHDL, LDLDIRECT in the last 72 hours. Thyroid Function Tests:  Recent Labs  12/18/16 1528  TSH 0.987   Anemia Panel: No results for input(s): VITAMINB12, FOLATE, FERRITIN, TIBC, IRON, RETICCTPCT in the last 72 hours. Urine analysis:    Component Value Date/Time   COLORURINE YELLOW 12/18/2016 1700   APPEARANCEUR CLEAR 12/18/2016 1700   LABSPEC 1.008 12/18/2016 1700   PHURINE 7.0 12/18/2016 1700   GLUCOSEU NEGATIVE 12/18/2016 1700   HGBUR NEGATIVE 12/18/2016 1700   BILIRUBINUR NEGATIVE 12/18/2016 1700   KETONESUR NEGATIVE 12/18/2016 1700   PROTEINUR NEGATIVE 12/18/2016 1700   UROBILINOGEN 0.2 08/31/2009 1628   NITRITE NEGATIVE 12/18/2016 1700   LEUKOCYTESUR NEGATIVE 12/18/2016 1700   Sepsis Labs: @LABRCNTIP (procalcitonin:4,lacticidven:4)  )No results found for this or any previous visit (from the past 240 hour(s)).       Radiology Studies: Dg Chest 2 View  Result Date: 12/18/2016 CLINICAL DATA:  Lower extremity pain and swelling. 20 pound weight gain EXAM: CHEST  2 VIEW COMPARISON:  12/08/2016 FINDINGS: Chronic cardiomegaly. Stable mild aortic tortuosity. Negative hila. Chronic mild reticulation of lower lung markings. There is no edema, consolidation, effusion, or pneumothorax. No acute osseous finding. IMPRESSION: 1. Stable from prior.  No evidence of acute disease. 2. Cardiomegaly. Electronically Signed    By: Monte Fantasia M.D.   On: 12/18/2016 16:08        Scheduled Meds: . atorvastatin  80 mg Oral QHS  . bisoprolol  5 mg Oral BID  . pantoprazole  40 mg Oral BID AC  . potassium chloride  20 mEq Oral QHS  . [START ON 12/20/2016] potassium chloride  40 mEq Oral q morning - 10a  . sodium chloride flush  3 mL Intravenous Q12H  . torsemide  80 mg Oral BID  . verapamil  80 mg Oral BID  . Warfarin - Pharmacist Dosing Inpatient   Does not apply q1800   Continuous Infusions: . sodium chloride       LOS: 1 day  Time spent: 35 minutes. Greater than 50% of this time was spent in direct contact with the patient coordinating care.     Lelon Frohlich, MD [facility] Pager [phone]  If 7PM-7AM, please contact night-coverage [email or web address] Password [facility] 12/19/2016, 6:58 PM

## 2016-12-19 NOTE — Progress Notes (Signed)
ANTICOAGULATION CONSULT NOTE - Initial Consult  Pharmacy Consult for coumadin Indication: atrial fibrillation  Allergies  Allergen Reactions  . Diltiazem Other (See Comments)    Edema    Patient Measurements: Height: 5' (152.4 cm) Weight: 176 lb 6 oz (80 kg) IBW/kg (Calculated) : 45.5   Vital Signs: Temp: 98.5 F (36.9 C) (09/05 0655) Temp Source: Oral (09/05 0655) BP: 108/44 (09/05 0655) Pulse Rate: 83 (09/05 0655)  Labs:  Recent Labs  12/18/16 1437 12/18/16 1440 12/18/16 2224 12/19/16 0714  HGB 9.3*  --   --   --   HCT 30.2*  --   --   --   PLT 203  --   --   --   LABPROT  --  28.2*  --  27.0*  INR  --  2.67  --  2.52  CREATININE 1.35*  --  1.13*  --     Estimated Creatinine Clearance: 35.9 mL/min (A) (by C-G formula based on SCr of 1.13 mg/dL (H)).   Medical History: Past Medical History:  Diagnosis Date  . Angiodysplasia of stomach and duodenum   . Carotid artery disease ([place])    RIght CEA 05/2004  . Chronic anticoagulation   . Chronic atrial fibrillation ([place])   . Chronic blood loss anemia   . Chronic diastolic CHF (congestive heart failure) ([place])   . Coronary atherosclerosis of native coronary artery    a. RCA stent 2003 with 60% LCx, 60% OM1, 80% OM2 disease at that time.  . Diverticulosis   . Essential hypertension, benign   . Gastric ulcer   . GI bleeding    Recurrent GIB (prior gastric ulcer/diverticulosis/AVM in 2017, gastric AVM s/p endoscopic therapy in 05/2016, recurrent GIB 06/2016 with AVM s/p therapy on endoscopy.  . Hyperlipidemia   . Lymphedema    a. in general, her LE edema appears to be a poor indicator of her total fluid status - chronic edema.  . Obesity   . Renal artery stenosis (HCC)    Bilateral renal artery stents 2004  . Tobacco abuse, in remission    40 pack year stopped in 1989  . Varicosities of leg     Medications:  Prescriptions Prior to Admission  Medication Sig Dispense Refill Last Dose  . acetaminophen  (TYLENOL) 500 MG tablet Take 500 mg by mouth every 6 (six) hours as needed for headache.   unknown  . atorvastatin (LIPITOR) 80 MG tablet TAKE 1 TABLET EVERY NIGHT AT BEDTIME 60 tablet 6 12/17/2016 at Unknown time  . bisoprolol (ZEBETA) 5 MG tablet take 1 tablet by mouth twice a day 60 tablet 0 12/18/2016 at 0900  . metolazone (ZAROXOLYN) 5 MG tablet Take 1 tablet po once a week as needed for swelling 20 tablet 6 12/18/2016 at Unknown time  . pantoprazole (PROTONIX) 40 MG tablet Take 1 tablet (40 mg total) by mouth 2 (two) times daily before a meal. 60 tablet 0 12/18/2016 at Unknown time  . Pediatric Multivitamins-Iron (FLINTSTONES PLUS IRON PO) Take 1 tablet by mouth 2 (two) times daily.    12/18/2016 at Unknown time  . potassium chloride SA (K-DUR,KLOR-CON) 20 MEQ tablet Take 40 meq (2 tablets) in the am , and take 20 meq (1 tablet) in the pm (Patient taking differently: Take 20-40 mEq by mouth See admin instructions. Take 40 meq (2 tablets) in the am , and take 20 meq (1 tablet) in the pm) 90 tablet 3 12/18/2016 at Unknown time  . torsemide (DEMADEX) 20  MG tablet Take 4 tablets (80 mg total) by mouth 2 (two) times daily. 240 tablet 3 12/18/2016 at Unknown time  . verapamil (CALAN) 80 MG tablet Take 1 tablet (80 mg total) by mouth 2 (two) times daily. 60 tablet 0 12/18/2016 at Unknown time  . warfarin (COUMADIN) 5 MG tablet Take 1 tablet daily except 1/2 tablet on Tuesdays and Saturdays or as directed by Coumadin Clinic (Patient taking differently: Take 2.5-5 mg by mouth daily. Takes 5 mg on every day except on Tuesdays and Saturday take 2.5 mg.) 40 tablet 4 12/18/2016 at 0900    Assessment: 83 yo lady to continue coumadin for afib.  Her INR remains therapeutic.   Goal of Therapy:  INR 2-3 Monitor platelets by anticoagulation protocol: Yes   Plan:  Coumadin 5mg  po x 1 today Daily PT/INR Monitor for bleeding complications  Isac Sarna, BS [place], BCPS Clinical Pharmacist Pager [phone] 12/19/2016,8:46  AM

## 2016-12-20 ENCOUNTER — Encounter (HOSPITAL_COMMUNITY): Payer: Self-pay | Admitting: Physical Therapy

## 2016-12-20 ENCOUNTER — Ambulatory Visit: Payer: Self-pay

## 2016-12-20 LAB — BASIC METABOLIC PANEL
ANION GAP: 9 (ref 5–15)
BUN: 18 mg/dL (ref 6–20)
CALCIUM: 8.5 mg/dL — AB (ref 8.9–10.3)
CHLORIDE: 98 mmol/L — AB (ref 101–111)
CO2: 30 mmol/L (ref 22–32)
Creatinine, Ser: 1.13 mg/dL — ABNORMAL HIGH (ref 0.44–1.00)
GFR calc non Af Amer: 44 mL/min — ABNORMAL LOW (ref >60)
GFR, EST AFRICAN AMERICAN: 51 mL/min — AB (ref >60)
GLUCOSE: 91 mg/dL (ref 65–99)
Potassium: 3.1 mmol/L — ABNORMAL LOW (ref 3.5–5.1)
Sodium: 137 mmol/L (ref 135–145)

## 2016-12-20 LAB — PROTIME-INR
INR: 2.14
Prothrombin Time: 23.7 seconds — ABNORMAL HIGH (ref 11.4–15.2)

## 2016-12-20 MED ORDER — WARFARIN SODIUM 5 MG PO TABS
5.0000 mg | ORAL_TABLET | Freq: Once | ORAL | Status: DC
  Filled 2016-12-20: qty 1

## 2016-12-20 MED ORDER — POTASSIUM CHLORIDE CRYS ER 20 MEQ PO TBCR
40.0000 meq | EXTENDED_RELEASE_TABLET | Freq: Once | ORAL | Status: AC
  Administered 2016-12-20: 40 meq via ORAL
  Filled 2016-12-20: qty 2

## 2016-12-20 NOTE — Discharge Summary (Signed)
Physician Discharge Summary  [name]:[id number] DOB: [birth year] DOA: 12/18/2016  PCP: [name], MD  Admit date: 12/18/2016 Discharge date: 12/20/2016  Time spent: 45 minutes  Recommendations for Outpatient Follow-up:  -To be discharged home today. -Advised follow-up with cardiologist in 1-2 weeks.   Discharge Diagnoses:  Active Problems:   Chronic atrial fibrillation (HCC)   PERIPHERAL VASCULAR DISEASE   Chronic anticoagulation   Hypertension   Obesity   Hyperlipidemia   Renal impairment   CKD (chronic kidney disease) stage 3, GFR 30-59 ml/min   Lymphedema   CHF, acute on chronic [facility])   Discharge Condition: Stable and improved  Filed Weights   12/18/16 1414 12/18/16 2020 12/19/16 0500  Weight: 78.9 kg (174 lb) 78.2 kg (172 lb 4.8 oz) 80 kg (176 lb 6 oz)    History of present illness:  As per Dr. Marin Comment on 9/4: [name] is an 83 y.o. female with hx of diastolic CHF, last ECHO May 2018 showing EF 60%, with moderate to severe TR, afib on therapeutic Coumadin, anemia, UTI, HTN, recurrent GI Bleed, HLD, chronic lymphedema, sent in from cardiology clinic as she has gained weight, having severe bilateral lower leg edema.  She was not taking her diuresis, as she had elevated her Cr, but since, it has normalized.  She was recommended IV diuresis, but at first refused to be admitted.  Work up included a K of 2.3 mE/L, and no leukocytosis with Cr of 1.3, and her CXR did not show any vascular congestion.  She was given 80mg  of IV Lasix, and hospitalist was asked to admit her per Ms Purcell Nails 's recommendation.  She denied CP, SOB, fever, chills or coughs.    Hospital Course:   Acute diastolic CHF -Echocardiogram from May 2018 with normal ejection fraction of 60%. -Patient placed on Lasix drip on admission, she has diuresed approximately 2.2 L since admission. Volume status is much improved. -Was transitioned back to home torsemide dose 24 hours prior to  discharge and kept in the hospital to ensure she could sustain adequate diuresis which she has.  Hypokalemia -Potassium is 3. 1 on discharge, continue to replace daily. -Magnesium was 1.6, given IV mag prior to discharge.  Hyperlipidemia -Continue atorvastatin  Atrial fibrillation .Rate controlled, anticoagulated on Coumadin.  Hypertension -Well-controlled, continue current medications.  Chronic kidney disease stage III -Creatinine remains at baseline of around 1.1-1.3.    Procedures:  None   Consultations:  None  Discharge Instructions  Discharge Instructions    Diet - low sodium heart healthy    Complete by:  As directed    Increase activity slowly    Complete by:  As directed      Allergies as of 12/20/2016      Reactions   Diltiazem Other (See Comments)   Edema      Medication List    STOP taking these medications   metolazone 5 MG tablet Commonly known as:  ZAROXOLYN     TAKE these medications   acetaminophen 500 MG tablet Commonly known as:  TYLENOL Take 500 mg by mouth every 6 (six) hours as needed for headache.   atorvastatin 80 MG tablet Commonly known as:  LIPITOR TAKE 1 TABLET EVERY NIGHT AT BEDTIME   bisoprolol 5 MG tablet Commonly known as:  ZEBETA take 1 tablet by mouth twice a day   FLINTSTONES PLUS IRON PO Take 1 tablet by mouth 2 (two) times daily.   pantoprazole 40 MG tablet  Commonly known as:  PROTONIX Take 1 tablet (40 mg total) by mouth 2 (two) times daily before a meal.   potassium chloride SA 20 MEQ tablet Commonly known as:  K-DUR,KLOR-CON Take 40 meq (2 tablets) in the am , and take 20 meq (1 tablet) in the pm What changed:  how much to take  how to take this  when to take this  additional instructions   torsemide 20 MG tablet Commonly known as:  DEMADEX Take 4 tablets (80 mg total) by mouth 2 (two) times daily.   verapamil 80 MG tablet Commonly known as:  CALAN Take 1 tablet (80 mg total) by mouth 2  (two) times daily.   warfarin 5 MG tablet Commonly known as:  COUMADIN Take 1 tablet daily except 1/2 tablet on Tuesdays and Saturdays or as directed by Coumadin Clinic What changed:  how much to take  how to take this  when to take this  additional instructions            Discharge Care Instructions        Start     Ordered   12/20/16 0000  Increase activity slowly     12/20/16 1215   12/20/16 0000  Diet - low sodium heart healthy     12/20/16 1215     Allergies  Allergen Reactions  . Diltiazem Other (See Comments)    Edema   Follow-up Information    [name], MD. Schedule an appointment as soon as possible for a visit in 2 week(s).   Specialty:  Family Medicine Contact information: [address] [place] [address] [phone]            The results of significant diagnostics from this hospitalization (including imaging, microbiology, ancillary and laboratory) are listed below for reference.    Significant Diagnostic Studies: Ct Abdomen Pelvis Wo Contrast  Result Date: 12/08/2016 CLINICAL DATA:  Intermittent abdominal pain progressively worsening over the past week. Nausea and vomiting. EXAM: CT ABDOMEN AND PELVIS WITHOUT CONTRAST TECHNIQUE: Multidetector CT imaging of the abdomen and pelvis was performed following the standard protocol without IV contrast. COMPARISON:  Abdomen radiographs from 09/04/2016 FINDINGS: Lower chest: Cardiomegaly with aortic atherosclerosis and 3 vessel coronary arteriosclerosis is noted. No pericardial effusion. Visualization of the ventricular septum can be seen in anemia. No acute pulmonary disease. Hepatobiliary: Patchy ill-defined hypodense appearance of the liver may reflect areas of fatty infiltration. No definite space-occupying mass is identified on given the limitations of an unenhanced study. No biliary dilatation is identified. The gallbladder is nondistended without calculus. Gallbladder wall is  borderline thickened but this could be due to underdistention. Mild inflammation is not entirely excluded. Nonspecific minimal perihepatic fatty induration is seen overlying the right hepatic lobe. Pancreas: Unremarkable. No pancreatic ductal dilatation or surrounding inflammatory changes. Spleen: Normal in size without focal abnormality. Adrenals/Urinary Tract: 3 mm nonobstructing calculus in the right lower pole. No hydroureteronephrosis. No focal mass identified on this unenhanced study. The urinary bladder is nondistended and unremarkable in appearance. Stomach/Bowel: Contracted appearance of the stomach. Normal small bowel rotation. No bowel obstruction or inflammation. Scattered colonic diverticulosis. Status post appendectomy. Vascular/Lymphatic: Moderate aortoiliac and branch vessel atherosclerosis without aneurysm. Bilateral renal artery stents are noted. No lymphadenopathy. Reproductive: Uterus and bilateral adnexa are unremarkable. Other: Tiny fat containing umbilical hernia. Mild dependent subcutaneous soft tissue edema along the lower lumbar spine and sacrum. Musculoskeletal: Chronic mild-to-moderate L2 compression of the superior endplate. No retropulsion. IMPRESSION: 1. Cardiomegaly with aortic atherosclerosis  and 3 vessel coronary arteriosclerosis. 2. Fatty infiltration of the liver with slight gallbladder wall thickening likely due to underdistention. No gallstones are noted. 3. Tiny focus of perihepatic mesenteric edema overlying the right hepatic lobe, nonspecific possibly related to recent unspecified inflammatory change. Colonic diverticulosis is currently noted without acute diverticulitis. 4. No acute bowel obstruction or inflammation. 5. 3 mm nonobstructing right renal calculus in the lower pole. 6. Chronic mild to moderate superior endplate compression of L2 without retropulsion. Electronically Signed   By: Ashley Royalty M.D.   On: 12/08/2016 17:13   Dg Chest 2 View  Result Date:  12/18/2016 CLINICAL DATA:  Lower extremity pain and swelling. 20 pound weight gain EXAM: CHEST  2 VIEW COMPARISON:  12/08/2016 FINDINGS: Chronic cardiomegaly. Stable mild aortic tortuosity. Negative hila. Chronic mild reticulation of lower lung markings. There is no edema, consolidation, effusion, or pneumothorax. No acute osseous finding. IMPRESSION: 1. Stable from prior.  No evidence of acute disease. 2. Cardiomegaly. Electronically Signed   By: Monte Fantasia M.D.   On: 12/18/2016 16:08   Dg Chest 2 View  Result Date: 12/08/2016 CLINICAL DATA:  Abdominal pain x1 week, nausea EXAM: CHEST  2 VIEW COMPARISON:  09/04/2016 FINDINGS: Lungs are essentially clear. Possible mild right basilar atelectasis. No pleural effusion or pneumothorax. The heart is top-normal in size. Mild degenerative changes of the visualized thoracolumbar spine. IMPRESSION: No evidence of acute cardiopulmonary disease. Electronically Signed   By: Julian Hy M.D.   On: 12/08/2016 16:37    Microbiology: No results found for this or any previous visit (from the past 240 hour(s)).   Labs: Basic Metabolic Panel:  Recent Labs Lab 12/14/16 0847 12/18/16 1437 12/18/16 2224 12/19/16 0714 12/20/16 0440  NA 131* 133* 132* 133* 137  K 5.1 2.3* 2.7* 3.0* 3.1*  CL 98* 94* 94* 95* 98*  CO2 23 28 29 27 30   GLUCOSE 101* 163* 102* 91 91  BUN 36* 19 18 16 18   CREATININE 2.20* 1.35* 1.13* 1.11* 1.13*  CALCIUM 9.3 8.6* 8.1* 8.5* 8.5*  MG  --  1.6*  --   --   --    Liver Function Tests:  Recent Labs Lab 12/14/16 0847 12/18/16 1437  AST 57* 48*  ALT 37 45  ALKPHOS 98 108  BILITOT 1.8* 1.1  PROT 7.0 7.1  ALBUMIN 3.3* 3.4*   No results for input(s): LIPASE, AMYLASE in the last 168 hours. No results for input(s): AMMONIA in the last 168 hours. CBC:  Recent Labs Lab 12/14/16 0748 12/14/16 0847 12/18/16 1437  WBC  --  11.7* 10.2  NEUTROABS  --  9.4* 8.7*  HGB 6.5* 9.1* 9.3*  HCT  --  29.3* 30.2*  MCV  --  79.8  78.4  PLT  --  189 203   Cardiac Enzymes: No results for input(s): CKTOTAL, CKMB, CKMBINDEX, TROPONINI in the last 168 hours. BNP: BNP (last 3 results)  Recent Labs  08/25/16 0922 12/18/16 1528  BNP 438.0* 538.0*    ProBNP (last 3 results) No results for input(s): PROBNP in the last 8760 hours.  CBG: No results for input(s): GLUCAP in the last 168 hours.     SignedLelon Frohlich  [facility] Pager: [phone] 12/20/2016, 2:45 PM

## 2016-12-20 NOTE — Progress Notes (Signed)
ANTICOAGULATION CONSULT NOTE - follow up  Pharmacy Consult for coumadin Indication: atrial fibrillation  Allergies  Allergen Reactions  . Diltiazem Other (See Comments)    Edema    Patient Measurements: Height: 5' (152.4 cm) Weight: 176 lb 6 oz (80 kg) IBW/kg (Calculated) : 45.5   Vital Signs: Temp: 98 F (36.7 C) (09/06 0546) Temp Source: Oral (09/06 0546) BP: 112/50 (09/06 0546) Pulse Rate: 64 (09/06 0546)  Labs:  Recent Labs  12/18/16 1437 12/18/16 1440 12/18/16 2224 12/19/16 0714 12/20/16 0440  HGB 9.3*  --   --   --   --   HCT 30.2*  --   --   --   --   PLT 203  --   --   --   --   LABPROT  --  28.2*  --  27.0* 23.7*  INR  --  2.67  --  2.52 2.14  CREATININE 1.35*  --  1.13* 1.11* 1.13*    Estimated Creatinine Clearance: 35.9 mL/min (A) (by C-G formula based on SCr of 1.13 mg/dL (H)).   Medical History: Past Medical History:  Diagnosis Date  . Angiodysplasia of stomach and duodenum   . Carotid artery disease ([place])    RIght CEA 05/2004  . Chronic anticoagulation   . Chronic atrial fibrillation ([place])   . Chronic blood loss anemia   . Chronic diastolic CHF (congestive heart failure) ([place])   . Coronary atherosclerosis of native coronary artery    a. RCA stent 2003 with 60% LCx, 60% OM1, 80% OM2 disease at that time.  . Diverticulosis   . Essential hypertension, benign   . Gastric ulcer   . GI bleeding    Recurrent GIB (prior gastric ulcer/diverticulosis/AVM in 2017, gastric AVM s/p endoscopic therapy in 05/2016, recurrent GIB 06/2016 with AVM s/p therapy on endoscopy.  . Hyperlipidemia   . Lymphedema    a. in general, her LE edema appears to be a poor indicator of her total fluid status - chronic edema.  . Obesity   . Renal artery stenosis (HCC)    Bilateral renal artery stents 2004  . Tobacco abuse, in remission    40 pack year stopped in 1989  . Varicosities of leg     Medications:  Prescriptions Prior to Admission  Medication Sig Dispense  Refill Last Dose  . acetaminophen (TYLENOL) 500 MG tablet Take 500 mg by mouth every 6 (six) hours as needed for headache.   unknown  . atorvastatin (LIPITOR) 80 MG tablet TAKE 1 TABLET EVERY NIGHT AT BEDTIME 60 tablet 6 12/17/2016 at Unknown time  . bisoprolol (ZEBETA) 5 MG tablet take 1 tablet by mouth twice a day 60 tablet 0 12/18/2016 at 0900  . metolazone (ZAROXOLYN) 5 MG tablet Take 1 tablet po once a week as needed for swelling 20 tablet 6 12/18/2016 at Unknown time  . pantoprazole (PROTONIX) 40 MG tablet Take 1 tablet (40 mg total) by mouth 2 (two) times daily before a meal. 60 tablet 0 12/18/2016 at Unknown time  . Pediatric Multivitamins-Iron (FLINTSTONES PLUS IRON PO) Take 1 tablet by mouth 2 (two) times daily.    12/18/2016 at Unknown time  . potassium chloride SA (K-DUR,KLOR-CON) 20 MEQ tablet Take 40 meq (2 tablets) in the am , and take 20 meq (1 tablet) in the pm (Patient taking differently: Take 20-40 mEq by mouth See admin instructions. Take 40 meq (2 tablets) in the am , and take 20 meq (1 tablet) in the pm)  90 tablet 3 12/18/2016 at Unknown time  . torsemide (DEMADEX) 20 MG tablet Take 4 tablets (80 mg total) by mouth 2 (two) times daily. 240 tablet 3 12/18/2016 at Unknown time  . verapamil (CALAN) 80 MG tablet Take 1 tablet (80 mg total) by mouth 2 (two) times daily. 60 tablet 0 12/18/2016 at Unknown time  . warfarin (COUMADIN) 5 MG tablet Take 1 tablet daily except 1/2 tablet on Tuesdays and Saturdays or as directed by Coumadin Clinic (Patient taking differently: Take 2.5-5 mg by mouth daily. Takes 5 mg on every day except on Tuesdays and Saturday take 2.5 mg.) 40 tablet 4 12/18/2016 at 0900    Assessment: 83 yo lady to continue coumadin for afib.  Her INR remains therapeutic.   Goal of Therapy:  INR 2-3 Monitor platelets by anticoagulation protocol: Yes   Plan:  Coumadin 5mg  po x 1 today Daily PT/INR Monitor for bleeding complications  Isac Sarna, BS [place], BCPS Clinical  Pharmacist Pager [phone] 12/20/2016,11:52 AM

## 2016-12-20 NOTE — Plan of Care (Addendum)
Problem: Food- and Nutrition-Related Knowledge Deficit (NB-1.1) Goal: Nutrition education Formal process to instruct or train a patient/client in a skill or to impart knowledge to help patients/clients voluntarily manage or modify food choices and eating behavior to maintain or improve health. Outcome: Adequate for Discharge Nutrition Education Note  RD consulted for nutrition education regarding CHF.   Pt is delightful 83 y/o female who states she has been trying to "take care of myself". She has been educated on diet in past. She feels her diuretics do not help her. She is frustrated with her uncontrolled swelling.   Went through dietary recall Breakfast (10 AM): Toast, Oatmeal, grits, Egg. Makes own pancakes/waffles with Bisquick. Uses Crisco to grease pan Lunch: Sandwich: Bologna, [place] Dinner: Varies, always contains vegetable. Steams cabbage frequently. Uses frozen meats.  Bevs: 2x 16.9 oz waters per day , 1 glass of coffee, 1 glass tea Other: Does eat canned salt fairly frequently, makes a lot of homemade sauces and soups. Denies eating hotdogs, frozen meals or fast food on recurring basis.   Pt's diet is all in all not to bad, though, there are some changes she could make to help lower her sodium intake.   First and foremost, she should eliminate her condensed, canned soup. She says she does make her own soup as well. Told her this is a MUCH better option. She says she uses a low sodium Broth. Advised using the lowest sodium broth/stock she could find. She add a mix of vegetables that does likely have salt, but this is probably still a much better option than her canned processed soups.   She eats deli meat sandwiches frequently. She knows these are high in sodium. RD recommended slicing her own fresh turkey/chicken breasts. She says she lives by her self and this is just not economically reasonable for her. As an alternative, she can rinse off her sliced meats. This is an easy and  quick way for her to reduce sodium intake. She agreed to this.   She eats a fairly large amount of pasta. However, like her soup, she makes homemade sauce with low sodium tomato juice/paste. This is good. Again, advised her to find lowest sodium option available. Also be cognizant of amount of sauce she eats at one time. RD recommended brown noodles. She said she could switch  She does use some condiments, namely ketchup, mustard and salad dressing. She only on occasion will have mustard and typically eats her ketchup with baked fries. RD did express some concern over the fries, though she maintains she eats these mindfully. Condiment wise, RD had most concern regarding her salad dressing. She uses thousand [place] and [place]. RD educated how dressings commonly are extremely high in sodium. RD asked her to go home and check the labels. She should aim for no more than 200-300 mg/ serving. She said she would check.   She bakes and makes a lot of items from scratch. This is a very good habit for her, if she is smart about what ingredients she chooses. RD recommended looking at labels and choosing the lowest sodium bread crumbs, flour, tomato paste, crust etc to use in her recipes.   She asks if she can still eat some of her favorite items, including frozen pizza and bratwurst (she is german and eats a lot of cultural options) . She says she has been skipping church, which is something that brings her great joy, because the congregation always brings a large amount of food that she  knows is not good for her. She cannot help eating some, so she does not go to church. SHe is saddened by this. RD had frank discussion that she deserves to have a good quality of life and enjoy some of her favorite foods with friends; she should just be careful of frequency and amount she eats them. Stressed everything in moderation'. She was very appreciative to hear this  RD "Heart Failure Nutrition Therapy" handout from the  [employer]y [employer].   RD discussed why it is important for patient to adhere to diet recommendations, and emphasized the role of fluids. From her reports, she sounds to only be drinking 48 oz of fluids a day. Do not believe this is a problem area.   She does not like weighing herself because she feels like no matter what she does the weight just goes up. RD stressed importance of weighing self daily and asked her to communicate to her cardiology provider that she feels her diuretics have no affect on her. They can adjust these if needed. Also, she says she was sized to receive compression stockings 3 weeks ago, but has not received these. Urged her to f/u with company. These will also help a lot.   Summary of recommendations 1. Eliminate canned soup. Batch prepare fresh soup. Use lowest sodium broth/stock available 2. Rinse Lunch meats 3. Investigate sodium content of her salad dressings, choose one no more than 200-300/serving 4. When making home made recipes, choose low sodium bread crumbs, flour, tomato paste, crust etc 5. Read labels more often! Know what she is eating. Choose low salt brands 6. Everything in moderation! Maintain quality of life.  7. Weigh self daily, f/u with cardiology regarding belief her diuretics have not helped her.   Expect GOOD compliance. She was extremely cordial and asked very insightful questions. She DESIRES to be healthier, but is frustrated by her inability to manage her swelling. She also rightfully wants to have some of her favorite ethnic choices. Spent large amount of time with patient and we established good dietary interventions, to all of which she was agreeable.  No further nutrition interventions warranted at this time. RD provide office number for patient to call in event she is still struggling. If additional nutrition issues arise, please re-consult RD.   Burtis Junes RD, LDN, CNSC Clinical Nutrition Pager: [phone] 12/20/2016 3:11  PM

## 2016-12-20 NOTE — Care Management Note (Signed)
Case Management Note  Patient Details  Name: [name] MRN: [id number] Date of Birth: June 22, 1932  Subjective/Objective:                  Pt admitted with CHF. Pt from home, alone, ind with ADL's. She has PCP, cardiologist and insurance with drug coverage. Pt has scale, used to weigh herself daily but quit because she didn't like "seeing the scale go up, up, up". Pt reports being noncompliant with diet. Now plan to be more compliant with diet and weighing in order to stay out of hospital. Pt says her only concern about discharge is that the fluid will re-accumulate. Pt declines [facility] [place] [facility] nursing for dx management.   Action/Plan: DC home today with self care. CM has placed consult for dietitian for education. CM has educated pt on importance of weighing daily and that preventing readmissions. Pt will be referred for emmi transition calls for [facility] at DC.   Expected Discharge Date:     12/20/2016             Expected Discharge Plan:  Home/Self Care  In-House Referral:  Nutrition  Discharge planning Services  CM Consult  Post Acute Care Choice:  NA Choice offered to:  NA  Status of Service:  Completed, signed off  Sherald Barge, RN 12/20/2016, 11:05 AM

## 2016-12-24 ENCOUNTER — Telehealth (HOSPITAL_COMMUNITY): Payer: Self-pay | Admitting: Physical Therapy

## 2016-12-24 ENCOUNTER — Encounter (HOSPITAL_COMMUNITY): Payer: Self-pay | Admitting: Physical Therapy

## 2016-12-24 ENCOUNTER — Other Ambulatory Visit: Payer: Self-pay | Admitting: *Deleted

## 2016-12-24 NOTE — Patient Outreach (Signed)
[facility]) Care Management  12/24/2016  [name] [birth year] [id number]  Peggy S McKinneyis an 83 y.o.femalewith hx of diastolic CHF, moderate to severe TR, afib on therapeutic Coumadin, anemia, UTI, HTN, recurrent GI Bleed, HLD, chronic lymphedema, who was hospitalized from the cardiology office for CHF exacerbation.   [name] admitted to not taking her prescribed diuretic. She was treated with IV diuresis with improvement in fluid volume status = -2.2L at discharge. She was prescribed her regular torsemide prior to discharge. Mrs. Both endured difficulty with hypokalemia and low magnesium which were both treated and resolved prior to discharge.   Upon discharge [name] was advised as follows:   1. Low Sodium, Heart Healthy diet.  2. Discontinue Metolazone.  3. Increase Activity Slowly.  I reached out to [name] today to follow up on her discharge and to respond to an [employer] (CHF). I was unable to reach [name] but left a HIPPA compliant voice message requesting a return call.   Plan: I will reach out to [name] again tomorrow by phone and will notify her primary care manager, upon her return, of the events as outlined above.    [facility]  [phone]

## 2016-12-24 NOTE — Telephone Encounter (Signed)
Pts daughter called to get the number to [place] and [employer]. Pt's daughter was supplied with both number. NF 12/24/16

## 2016-12-25 ENCOUNTER — Ambulatory Visit: Payer: Self-pay | Admitting: *Deleted

## 2016-12-25 NOTE — Progress Notes (Signed)
Cardiology Office Note  Date: 12/26/2016   ID: LACONDA BASICH, DOB Dec 24, 1932, MRN [id number]  PCP: [name], MD  Primary Cardiologist: Rozann Lesches, MD   Chief Complaint  Patient presents with  . Lymphedema  . Coronary Artery Disease    History of Present Illness: [name] is an 83 y.o. female seen very recently on September 4 by Ms. West Pugh. At that time she had reportedly gained 20 pounds with worsening leg edema and lymphedema following reduction in metolazone in the face of worsening renal function. She was hospitalized for IV diuresis, approximately 2.2 L. She was discharged on Demadex 80 mg twice daily.  She is here today with her daughter. She states that she is uncomfortable due to leg pain and swelling. She reports compliance with Demadex 80 mg twice daily. She has also been drinking 32 ounces of water daily.  Today we discussed management of lymphedema which optimally includes regular mechanical compression. She has not been able to follow-up regularly with PT due to lack of transportation. She states that she does have a home device on the way. Nursing is going to check to see if she can have PT here in [place] and if there are other options for transportation.  Past Medical History:  Diagnosis Date  . Angiodysplasia of stomach and duodenum   . Carotid artery disease ([place])    RIght CEA 05/2004  . Chronic anticoagulation   . Chronic atrial fibrillation ([place])   . Chronic blood loss anemia   . Chronic diastolic CHF (congestive heart failure) ([place])   . Coronary atherosclerosis of native coronary artery    a. RCA stent 2003 with 60% LCx, 60% OM1, 80% OM2 disease at that time.  . Diverticulosis   . Essential hypertension   . Gastric ulcer   . GI bleeding    Recurrent GIB (prior gastric ulcer/diverticulosis/AVM in 2017, gastric AVM s/p endoscopic therapy in 05/2016, recurrent GIB 06/2016 with AVM s/p therapy on endoscopy.  . Hyperlipidemia   .  Lymphedema   . Obesity   . Renal artery stenosis (H[facility])    Bilateral renal artery stents 2004  . Tobacco abuse, in remission    40 pack year stopped in 1989    Past Surgical History:  Procedure Laterality Date  . APPENDECTOMY    . CAROTID ENDARTERECTOMY     Right in 2005, left in 2010  . COLONOSCOPY N/A 08/19/2015   Procedure: COLONOSCOPY;  Surgeon: Rogene Houston, MD;  [facility];  Service: Endoscopy;  Laterality: N/A;  7:30  . CORONARY ANGIOPLASTY    . ESOPHAGOGASTRODUODENOSCOPY N/A 08/19/2015   Procedure: ESOPHAGOGASTRODUODENOSCOPY (EGD);  Surgeon: Rogene Houston, MD;  [facility];  Service: Endoscopy;  Laterality: N/A;  . ESOPHAGOGASTRODUODENOSCOPY N/A 05/24/2016   Procedure: ESOPHAGOGASTRODUODENOSCOPY (EGD);  Surgeon: Rogene Houston, MD;  [facility];  Service: Endoscopy;  Laterality: N/A;  . ESOPHAGOGASTRODUODENOSCOPY N/A 06/19/2016   Procedure: ESOPHAGOGASTRODUODENOSCOPY (EGD);  Surgeon: Rogene Houston, MD;  [facility];  Service: Endoscopy;  Laterality: N/A;  . ESOPHAGOGASTRODUODENOSCOPY N/A 09/05/2016   Procedure: ESOPHAGOGASTRODUODENOSCOPY (EGD);  Surgeon: Rogene Houston, MD;  [facility];  Service: Endoscopy;  Laterality: N/A;  . GIVENS CAPSULE STUDY N/A 06/20/2016   Procedure: GIVENS CAPSULE STUDY;  Surgeon: Rogene Houston, MD;  [facility];  Service: Endoscopy;  Laterality: N/A;    Current Outpatient Prescriptions  Medication Sig Dispense Refill  . acetaminophen (TYLENOL)  500 MG tablet Take 500 mg by mouth every 6 (six) hours as needed for headache.    Marland Kitchen atorvastatin (LIPITOR) 80 MG tablet TAKE 1 TABLET EVERY NIGHT AT BEDTIME 60 tablet 6  . bisoprolol (ZEBETA) 5 MG tablet take 1 tablet by mouth twice a day 60 tablet 0  . Multiple Vitamin (MULTIVITAMIN) capsule Take 1 capsule by mouth daily.    . pantoprazole (PROTONIX) 40 MG tablet Take 1 tablet (40 mg total) by mouth 2 (two) times daily before a meal.  60 tablet 0  . potassium chloride SA (K-DUR,KLOR-CON) 20 MEQ tablet Take 40 meq (2 tablets) in the am , and take 20 meq (1 tablet) in the pm (Patient taking differently: Take 20-40 mEq by mouth See admin instructions. Take 40 meq (2 tablets) in the am , and take 20 meq (1 tablet) in the pm) 90 tablet 3  . torsemide (DEMADEX) 20 MG tablet Take 4 tablets (80 mg total) by mouth 2 (two) times daily. 240 tablet 3  . verapamil (CALAN) 80 MG tablet Take 1 tablet (80 mg total) by mouth 2 (two) times daily. 60 tablet 0  . warfarin (COUMADIN) 5 MG tablet Take 1 tablet daily except 1/2 tablet on Tuesdays and Saturdays or as directed by Coumad[facility] Clinic (Patient taking differently: Take 2.5-5 mg by mouth daily. Takes 5 mg on every day except on Tuesdays and Saturday take 2.5 mg.) 40 tablet 4  . metolazone (ZAROXOLYN) 2.5 MG tablet Take 2.5 mg daily as directed. 30 tablet 1   No current facility-administered medications for this visit.    Allergies:  Diltiazem   Social History: The patient  reports that she quit smoking about 29 years ago. Her smoking use included Cigarettes. She has never used smokeless tobacco. She reports that she does not drink alcohol or use drugs.   ROS:  Please see the history of present illness. Otherwise, complete review of systems is positive for hearing loss, fatigue.  All other systems are reviewed and negative.   Physical Exam: VS:  BP 108/64 (BP Location: Left Arm)   Pulse 74   Ht 5\' 3"  (1.6 m)   Wt 177 lb (80.3 kg)   SpO2 98%   BMI 31.35 kg/m , BMI Body mass index is 31.35 kg/m.  Wt Readings from Last 3 Encounters:  12/26/16 177 lb (80.3 kg)  12/19/16 176 lb 6 oz (80 kg)  12/18/16 173 lb (78.5 kg)    Gen.: Overweight elderly woman, no distress. HEENT: Conjunctiva and lids normal, oropharynx clear. Neck: Supple, no elevated JVP or carotid bruits, no thyromegaly. Lungs: Clear to auscultation, nonlabored breathing at rest. Cardiac: Irregularly irregular, no S3 or  significant systolic murmur, no pericardial rub. Abdomen: Soft, nontender, bowel sounds present, no guarding or rebound. Extremities: Bilateral lymphedema and venous stasis affecting both legs, distal pulses 1+. Skin: Warm and dry. Dry skin on legs. Musculoskeletal: No kyphosis. Neuropsychiatric: Alert and oriented x3, affect grossly appropriate.  ECG: I personally reviewed the tracing from 12/14/2016 which showed atrial fibrillation with low voltage, incomplete right bundle branch block, nonspecific ST-T changes.  Recent Labwork: 12/18/2016: ALT 45; AST 48; B Natriuretic Peptide 538.0; Hemoglobin 9.3; Magnesium 1.6; Platelets 203; TSH 0.987 12/20/2016: BUN 18; Creatinine, Ser 1.13; Potassium 3.1; Sodium 137     Component Value Date/Time   CHOL 91 (L) 12/22/2014 1035   TRIG 79 12/22/2014 1035   HDL 34 (L) 12/22/2014 1035   CHOLHDL 2.7 12/22/2014 1035   VLDL 16 12/22/2014 1035  [place] 41 12/22/2014 1035    Other Studies Reviewed Today:  Echocardiogram 08/26/2016: Study Conclusions  - Left ventricle: The cavity size was normal. Wall thickness was   increased in a pattern of mild LVH. Systolic function was normal.   The estimated ejection fraction was 60%. Wall motion was normal;   there were no regional wall motion abnormalities. The study is   not technically sufficient to allow evaluation of LV diastolic   function. - Aortic valve: Mildly to moderately calcified annulus. Trileaflet;   mildly calcified leaflets. There was no stenosis. - Mitral valve: Mildly thickened leaflets . There was mild   regurgitation. - Left atrium: The atrium was moderately dilated. - Right ventricle: The cavity size was mildly dilated. Systolic   function was mildly reduced. TAPSE: 14.3 mm . - Right atrium: The atrium was severely dilated. - Atrial septum: No defect or patent foramen ovale was identified. - Tricuspid valve: There was moderate-severe regurgitation. - Pulmonary arteries: PA peak  pressure: 41 mm Hg (S). - Inferior vena cava: The vessel was dilated. The respirophasic   diameter changes were blunted (< 50%), consistent with elevated   central venous pressure. Estimated CVP 15 mmHg.  Assessment and Plan:  1. Significant lymphedema affecting both legs. This has been a chronic recurring problem despite high-dose diuretics. She is now on Demadex 80 mg twice daily. She is prone to renal insufficiency with escalating doses of diuretics. She responded well to IV Lasix during recent hospital stay. Plan at this time is to add metolazone 2.5 mg temporarily beginning at 3 times a week, then 2 times a week, then once a week. She will have a BMET in 2 weeks with clinical visit in the office. Importantly, we are going to try and get her enrolled in lymphedema treatment through PT here in [place]. I do not think that she is going to make much improvement unless she is consistent with mechanical compression.  2. Chronic atrial fibrillation. Heart rate is adequately controlled on current regimen and she continues on Coumadin.  3. CAD status post RCA intervention in 2003 with moderate residual circumflex and obtuse marginal disease. She reports no angina on medical therapy and we will continue with observation.  4. History of blood loss anemia with gastric AVMs. Recently stable, hemoglobin 9.3.  Current medicines were reviewed with the patient today.   Orders Placed This Encounter  Procedures  . Basic Metabolic Panel (BMET)    Disposition: Follow-up in 2 weeks.  Signed, Satira Sark, MD, [facility] 12/26/2016 11:50 AM    [facility]. [address], [place], [place] [address] Phone: [phone]; Fax: [phone]

## 2016-12-26 ENCOUNTER — Ambulatory Visit (INDEPENDENT_AMBULATORY_CARE_PROVIDER_SITE_OTHER): Payer: Medicare Other | Admitting: Cardiology

## 2016-12-26 ENCOUNTER — Encounter: Payer: Self-pay | Admitting: Cardiology

## 2016-12-26 ENCOUNTER — Ambulatory Visit (INDEPENDENT_AMBULATORY_CARE_PROVIDER_SITE_OTHER): Payer: Medicare Other | Admitting: *Deleted

## 2016-12-26 ENCOUNTER — Encounter (HOSPITAL_COMMUNITY): Payer: Self-pay | Admitting: Physical Therapy

## 2016-12-26 ENCOUNTER — Emergency Department (HOSPITAL_COMMUNITY)
Admission: EM | Admit: 2016-12-26 | Discharge: 2016-12-26 | Disposition: A | Discharge status: Home / Self Care | Payer: Medicare Other | Attending: Emergency Medicine | Admitting: Emergency Medicine

## 2016-12-26 ENCOUNTER — Encounter (HOSPITAL_COMMUNITY): Payer: Self-pay | Admitting: Emergency Medicine

## 2016-12-26 VITALS — BP 108/64 | HR 74 | Ht 63.0 in | Wt 177.0 lb

## 2016-12-26 DIAGNOSIS — Z87891 Personal history of nicotine dependence: Secondary | ICD-10-CM | POA: Insufficient documentation

## 2016-12-26 DIAGNOSIS — I5032 Chronic diastolic (congestive) heart failure: Secondary | ICD-10-CM | POA: Insufficient documentation

## 2016-12-26 DIAGNOSIS — I4891 Unspecified atrial fibrillation: Secondary | ICD-10-CM | POA: Diagnosis not present

## 2016-12-26 DIAGNOSIS — Z7901 Long term (current) use of anticoagulants: Secondary | ICD-10-CM | POA: Insufficient documentation

## 2016-12-26 DIAGNOSIS — I482 Chronic atrial fibrillation, unspecified: Secondary | ICD-10-CM

## 2016-12-26 DIAGNOSIS — N183 Chronic kidney disease, stage 3 (moderate): Secondary | ICD-10-CM | POA: Diagnosis not present

## 2016-12-26 DIAGNOSIS — I130 Hypertensive heart and chronic kidney disease with heart failure and stage 1 through stage 4 chronic kidney disease, or unspecified chronic kidney disease: Secondary | ICD-10-CM | POA: Diagnosis not present

## 2016-12-26 DIAGNOSIS — I890 Lymphedema, not elsewhere classified: Secondary | ICD-10-CM

## 2016-12-26 DIAGNOSIS — I2510 Atherosclerotic heart disease of native coronary artery without angina pectoris: Secondary | ICD-10-CM

## 2016-12-26 DIAGNOSIS — R55 Syncope and collapse: Secondary | ICD-10-CM | POA: Insufficient documentation

## 2016-12-26 DIAGNOSIS — Z79899 Other long term (current) drug therapy: Secondary | ICD-10-CM | POA: Diagnosis not present

## 2016-12-26 DIAGNOSIS — Z95828 Presence of other vascular implants and grafts: Secondary | ICD-10-CM | POA: Diagnosis not present

## 2016-12-26 DIAGNOSIS — Z5181 Encounter for therapeutic drug level monitoring: Secondary | ICD-10-CM | POA: Diagnosis not present

## 2016-12-26 DIAGNOSIS — R42 Dizziness and giddiness: Secondary | ICD-10-CM | POA: Diagnosis present

## 2016-12-26 DIAGNOSIS — D500 Iron deficiency anemia secondary to blood loss (chronic): Secondary | ICD-10-CM | POA: Diagnosis not present

## 2016-12-26 LAB — CBG MONITORING, ED: GLUCOSE-CAPILLARY: 136 mg/dL — AB (ref 65–99)

## 2016-12-26 LAB — POCT INR: INR: 2.3

## 2016-12-26 MED ORDER — METOLAZONE 2.5 MG PO TABS: ORAL_TABLET | ORAL | 1 refills | Status: AC

## 2016-12-26 NOTE — ED Triage Notes (Signed)
Pt was seen at PCP office this morning and had sudden onset generalized trembling and dizziness. Pt denies dizziness at this time. Pt alert and oriented. Pt denies pain,numbness. Speech clear. Facial symmetry noted. Pt denies any changes in vision. nad noted.

## 2016-12-26 NOTE — Discharge Instructions (Signed)
Return for any new or worse symptoms. Follow up with your doctor as needed.

## 2016-12-26 NOTE — Addendum Note (Signed)
Addended by: Debbora Lacrosse R on: 12/26/2016 02:13 PM   Modules accepted: Orders

## 2016-12-26 NOTE — ED Provider Notes (Signed)
[facility] DEPT Provider Note   CSN: [id number] Arrival date & time: 12/26/16  1256     History   Chief Complaint Chief Complaint  Patient presents with  . Shaking    HPI Liya OMER MONTER is a 83 y.o. female.  Patient with recent admission to the hospital September 4 through September 6. This was for lower extremity edema and body fluid overload. Patient had follow-up with her primary care doctor's office today. Patient was seen her INR was checked and was therapeutic. At discharge from the office patient stood up for checkout at the desk and got of feeling lightheaded and looked pale and had dizziness. Patient was brought here for further evaluation. Upon arrival here patient in the bed without any complaints. No chest pain stated she felt back to normal. Patient without any abdominal pain no focal deficits.No shortness of breath.      Past Medical History:  Diagnosis Date  . Angiodysplasia of stomach and duodenum   . Carotid artery disease ([place])    RIght CEA 05/2004  . Chronic anticoagulation   . Chronic atrial fibrillation ([place])   . Chronic blood loss anemia   . Chronic diastolic CHF (congestive heart failure) ([place])   . Coronary atherosclerosis of native coronary artery    a. RCA stent 2003 with 60% LCx, 60% OM1, 80% OM2 disease at that time.  . Diverticulosis   . Essential hypertension   . Gastric ulcer   . GI bleeding    Recurrent GIB (prior gastric ulcer/diverticulosis/AVM in 2017, gastric AVM s/p endoscopic therapy in 05/2016, recurrent GIB 06/2016 with AVM s/p therapy on endoscopy.  . Hyperlipidemia   . Lymphedema   . Obesity   . Renal artery stenosis (H[facility])    Bilateral renal artery stents 2004  . Tobacco abuse, in remission    40 pack year stopped in 1989    Patient Active Problem List   Diagnosis Date Noted  . CHF, acute on chronic ([place]) 12/18/2016  . Urinary tract infection without hematuria   . GI bleed 12/08/2016  . Asymptomatic bacteriuria  12/08/2016  . Lymphedema 11/14/2016  . CKD (chronic kidney disease) stage 3, GFR 30-59 ml/min 09/05/2016  . Hypokalemia 09/05/2016  . CHF exacerbation ([place]) 08/25/2016  . Difficulty in walking, not elsewhere classified   . Dizziness and giddiness   . Gastrointestinal hemorrhage associated with peptic ulcer   . Renal impairment   . Acute blood loss anemia 05/22/2016  . Chronic blood loss anemia 05/22/2016  . Acute on chronic diastolic CHF (congestive heart failure) ([place]) 05/22/2016  . Acute renal failure superimposed on stage 3 chronic kidney disease ([place]) 05/22/2016  . Anticoagulated on Coumadin   . Symptomatic anemia 07/11/2015  . Microcytic anemia 07/11/2015  . Fatigue 07/11/2015  . Occlusion and stenosis of carotid artery without mention of cerebral infarction-Bilateral 11/11/2013  . Aftercare following surgery of the circulatory system, [place] 11/11/2013  . Varicose veins of bilateral lower extremities with other complications 97/67/3419  . Encounter for therapeutic drug monitoring 05/18/2013  . Fasting hyperglycemia 07/03/2012  . Chronic anticoagulation   . Tobacco abuse, in remission   . Carotid artery occlusion   . Hypertension   . Obesity   . Hyperlipidemia   . Chronic venous insufficiency 07/26/2010  . Chronic atrial fibrillation ([place]) 04/05/2010  . Coronary atherosclerosis of native coronary artery 04/05/2010  . PERIPHERAL VASCULAR DISEASE 04/05/2010    Past Surgical History:  Procedure Laterality Date  . APPENDECTOMY    . CAROTID  ENDARTERECTOMY     Right in 2005, left in 2010  . COLONOSCOPY N/A 08/19/2015   Procedure: COLONOSCOPY;  Surgeon: Rogene Houston, MD;  [facility];  Service: Endoscopy;  Laterality: N/A;  7:30  . CORONARY ANGIOPLASTY    . ESOPHAGOGASTRODUODENOSCOPY N/A 08/19/2015   Procedure: ESOPHAGOGASTRODUODENOSCOPY (EGD);  Surgeon: Rogene Houston, MD;  [facility];  Service: Endoscopy;  Laterality: N/A;  .  ESOPHAGOGASTRODUODENOSCOPY N/A 05/24/2016   Procedure: ESOPHAGOGASTRODUODENOSCOPY (EGD);  Surgeon: Rogene Houston, MD;  [facility];  Service: Endoscopy;  Laterality: N/A;  . ESOPHAGOGASTRODUODENOSCOPY N/A 06/19/2016   Procedure: ESOPHAGOGASTRODUODENOSCOPY (EGD);  Surgeon: Rogene Houston, MD;  [facility];  Service: Endoscopy;  Laterality: N/A;  . ESOPHAGOGASTRODUODENOSCOPY N/A 09/05/2016   Procedure: ESOPHAGOGASTRODUODENOSCOPY (EGD);  Surgeon: Rogene Houston, MD;  [facility];  Service: Endoscopy;  Laterality: N/A;  . GIVENS CAPSULE STUDY N/A 06/20/2016   Procedure: GIVENS CAPSULE STUDY;  Surgeon: Rogene Houston, MD;  [facility];  Service: Endoscopy;  Laterality: N/A;    OB History    Gravida Para Term Preterm AB Living             4   SAB TAB Ectopic Multiple Live Births                   Home Medications    Prior to Admission medications   Medication Sig Start Date End Date Taking? Authorizing Provider  acetaminophen (TYLENOL) 500 MG tablet Take 500 mg by mouth every 6 (six) hours as needed for headache.    [provider]  atorvastatin (LIPITOR) 80 MG tablet TAKE 1 TABLET EVERY NIGHT AT BEDTIME 05/07/16   Satira Sark, MD  bisoprolol (ZEBETA) 5 MG tablet take 1 tablet by mouth twice a day 12/06/16   Satira Sark, MD  metolazone (ZAROXOLYN) 2.5 MG tablet Take 2.5 mg daily as directed. 12/26/16   Satira Sark, MD  Multiple Vitamin (MULTIVITAMIN) capsule Take 1 capsule by mouth daily.    [provider]  pantoprazole (PROTONIX) 40 MG tablet Take 1 tablet (40 mg total) by mouth 2 (two) times daily before a meal. 10/12/16   Setzer, Rona Ravens, NP  potassium chloride SA (K-DUR,KLOR-CON) 20 MEQ tablet Take 40 meq (2 tablets) in the am , and take 20 meq (1 tablet) in the pm Patient taking differently: Take 20-40 mEq by mouth See admin instructions. Take 40 meq (2 tablets) in the am , and take 20 meq (1 tablet) in  the pm 11/01/16   Satira Sark, MD  torsemide (DEMADEX) 20 MG tablet Take 4 tablets (80 mg total) by mouth 2 (two) times daily. 10/24/16   Satira Sark, MD  verapamil (CALAN) 80 MG tablet Take 1 tablet (80 mg total) by mouth 2 (two) times daily. 12/11/16   Kathie Dike, MD  warfarin (COUMADIN) 5 MG tablet Take as directed by coumadin clinic    [provider]    Family History Family History  Problem Relation Age of Onset  . Lung cancer Mother   . Cancer Mother   . Hypertension Daughter   . Hyperlipidemia Daughter   . Hypertension Son   . Hypertension Daughter   . Heart disease Maternal Grandmother     Social History Social History  Substance Use Topics  . Smoking status: Former Smoker    Types: Cigarettes    Quit date: 04/30/1987  . Smokeless tobacco:  Never Used  . Alcohol use No     Allergies   Diltiazem   Review of Systems Review of Systems  Constitutional: Positive for fatigue.  HENT: Negative for congestion.   Eyes: Negative for visual disturbance.  Respiratory: Negative for shortness of breath.   Gastrointestinal: Negative for abdominal pain and nausea.  Genitourinary: Negative for dysuria.  Musculoskeletal: Negative for back pain.  Neurological: Positive for dizziness, tremors, weakness and light-headedness. Negative for seizures, syncope, speech difficulty and headaches.  Hematological: Does not bruise/bleed easily.  Psychiatric/Behavioral: Negative for confusion.     Physical Exam Updated Vital Signs BP (!) 118/50 (BP Location: Left Arm)   Pulse 70   Temp 97.8 F (36.6 C) (Oral)   Resp 20   Ht 1.549 m (5\' 1" )   Wt 80.3 kg (177 lb)   SpO2 95%   BMI 33.44 kg/m   Physical Exam  Constitutional: She is oriented to person, place, and time. She appears well-developed and well-nourished. No distress.  HENT:  Head: Normocephalic and atraumatic.  Mouth/Throat: Oropharynx is clear and moist.  Eyes: Pupils are equal, round, and  reactive to light. Conjunctivae and EOM are normal.  Neck: Neck supple.  Cardiovascular: Normal rate.   irregular  Pulmonary/Chest: Effort normal and breath sounds normal.  Abdominal: Soft. Bowel sounds are normal. There is no tenderness.  Musculoskeletal: Normal range of motion. She exhibits edema.  Neurological: She is alert and oriented to person, place, and time. No cranial nerve deficit or sensory deficit. She exhibits normal muscle tone. Coordination normal.  Skin: Skin is warm.  Nursing note and vitals reviewed.    ED Treatments / Results  Labs (all labs ordered are listed, but only abnormal results are displayed) Labs Reviewed  CBG MONITORING, ED - Abnormal; Notable for the following:       Result Value   Glucose-Capillary 136 (*)    All other components within normal limits    EKG  EKG Interpretation None       Radiology No results found.  Procedures Procedures (including critical care time)  Medications Ordered in ED Medications - No data to display   Initial Impression / Assessment and Plan / ED Course  I have reviewed the triage vital signs and the nursing notes.  Pertinent labs & imaging results that were available during my care of the patient were reviewed by me and considered in my medical decision making (see chart for details).     Patient asymptomatic upon arrival here. Patient ambulated without any symptoms at all. Patient's vital signs. Stable. Patient's blood sugar normal here. Cardiac monitor shows her baseline atrial fib with rate control. Patient does not do one additional lab work. Patient just discharged from the hospital September 6. Patient's oxygen saturation were in the upper 90s while ambulating. Patient stable for discharge home.  Final Clinical Impressions(s) / ED Diagnoses   Final diagnoses:  Near syncope    New Prescriptions New Prescriptions   No medications on file     Fredia Sorrow, MD 12/26/16 1627

## 2016-12-26 NOTE — Patient Instructions (Signed)
Medication Instructions:  Take metolazone 2.5 mg daily for 3 days this week, 2 days next week and 1 day the following week. ( after that do not take unless directed)    Labwork: 2 weeks  bmet  Testing/Procedures: none  Follow-Up: Your physician recommends that you schedule a follow-up appointment in: 2 weeks    Any Other Special Instructions Will Be Listed Below (If Applicable).     If you need a refill on your cardiac medications before your next appointment, please call your pharmacy.

## 2016-12-26 NOTE — ED Notes (Signed)
cbg en triage 138.

## 2016-12-27 ENCOUNTER — Other Ambulatory Visit (HOSPITAL_COMMUNITY): Payer: Self-pay

## 2016-12-27 ENCOUNTER — Telehealth: Payer: Self-pay | Admitting: *Deleted

## 2016-12-27 NOTE — Telephone Encounter (Signed)
Spoke with [facility] Surgery Coordinator for Dr Marisa Hua at [facility] and she states they do not hold coumadin for cataract surgery

## 2016-12-28 ENCOUNTER — Encounter (HOSPITAL_COMMUNITY): Payer: Self-pay | Admitting: Physical Therapy

## 2016-12-28 DIAGNOSIS — H25811 Combined forms of age-related cataract, right eye: Secondary | ICD-10-CM | POA: Diagnosis not present

## 2016-12-31 ENCOUNTER — Encounter (HOSPITAL_COMMUNITY)
Admit: 2016-12-31 | Discharge: 2016-12-31 | Disposition: A | Discharge status: Home / Self Care | Payer: Medicare Other | Attending: Ophthalmology | Admitting: Ophthalmology

## 2016-12-31 ENCOUNTER — Encounter (HOSPITAL_COMMUNITY): Payer: Self-pay

## 2017-01-01 NOTE — Pre-Procedure Instructions (Signed)
Dr Patsey Berthold aware of 3.1 potassium. No orders given.

## 2017-01-04 ENCOUNTER — Telehealth: Payer: Self-pay | Admitting: *Deleted

## 2017-01-04 ENCOUNTER — Encounter (HOSPITAL_COMMUNITY)
Admission: RE | Disposition: A | Discharge status: Home / Self Care | Payer: Self-pay | Source: Ambulatory Visit | Attending: Ophthalmology

## 2017-01-04 ENCOUNTER — Ambulatory Visit (HOSPITAL_COMMUNITY)
Admission: RE | Admit: 2017-01-04 | Discharge: 2017-01-04 | Disposition: A | Discharge status: Home / Self Care | Payer: Medicare Other | Source: Ambulatory Visit | Attending: Ophthalmology | Admitting: Ophthalmology

## 2017-01-04 ENCOUNTER — Ambulatory Visit (HOSPITAL_COMMUNITY): Payer: Medicare Other | Admitting: Anesthesiology

## 2017-01-04 ENCOUNTER — Encounter (HOSPITAL_COMMUNITY): Payer: Self-pay | Admitting: *Deleted

## 2017-01-04 DIAGNOSIS — E7800 Pure hypercholesterolemia, unspecified: Secondary | ICD-10-CM | POA: Insufficient documentation

## 2017-01-04 DIAGNOSIS — I509 Heart failure, unspecified: Secondary | ICD-10-CM | POA: Diagnosis not present

## 2017-01-04 DIAGNOSIS — I110 Hypertensive heart disease with heart failure: Secondary | ICD-10-CM | POA: Insufficient documentation

## 2017-01-04 DIAGNOSIS — Z79899 Other long term (current) drug therapy: Secondary | ICD-10-CM | POA: Insufficient documentation

## 2017-01-04 DIAGNOSIS — I2510 Atherosclerotic heart disease of native coronary artery without angina pectoris: Secondary | ICD-10-CM | POA: Insufficient documentation

## 2017-01-04 DIAGNOSIS — H25811 Combined forms of age-related cataract, right eye: Secondary | ICD-10-CM | POA: Diagnosis not present

## 2017-01-04 DIAGNOSIS — Z87891 Personal history of nicotine dependence: Secondary | ICD-10-CM | POA: Insufficient documentation

## 2017-01-04 DIAGNOSIS — I739 Peripheral vascular disease, unspecified: Secondary | ICD-10-CM | POA: Insufficient documentation

## 2017-01-04 DIAGNOSIS — H2511 Age-related nuclear cataract, right eye: Secondary | ICD-10-CM | POA: Diagnosis not present

## 2017-01-04 DIAGNOSIS — Z7901 Long term (current) use of anticoagulants: Secondary | ICD-10-CM | POA: Diagnosis not present

## 2017-01-04 HISTORY — PX: CATARACT EXTRACTION W/PHACO: SHX586

## 2017-01-04 SURGERY — PHACOEMULSIFICATION, CATARACT, WITH IOL INSERTION
Anesthesia: Monitor Anesthesia Care | Site: Eye | Laterality: Right

## 2017-01-04 MED ORDER — LACTATED RINGERS IV SOLN
INTRAVENOUS | Status: DC
  Administered 2017-01-04 (×2): via INTRAVENOUS

## 2017-01-04 MED ORDER — MIDAZOLAM HCL 2 MG/2ML IJ SOLN
INTRAMUSCULAR | Status: AC
  Filled 2017-01-04: qty 2

## 2017-01-04 MED ORDER — NEOMYCIN-POLYMYXIN-DEXAMETH 3.5-10000-0.1 OP SUSP
OPHTHALMIC | Status: DC | PRN
  Administered 2017-01-04: 2 [drp] via OPHTHALMIC

## 2017-01-04 MED ORDER — PHENYLEPHRINE HCL 2.5 % OP SOLN
1.0000 [drp] | OPHTHALMIC | Status: AC | PRN
  Administered 2017-01-04 (×3): 1 [drp] via OPHTHALMIC

## 2017-01-04 MED ORDER — TETRACAINE HCL 0.5 % OP SOLN
1.0000 [drp] | OPHTHALMIC | Status: AC | PRN
  Administered 2017-01-04 (×3): 1 [drp] via OPHTHALMIC

## 2017-01-04 MED ORDER — SODIUM HYALURONATE 23 MG/ML IO SOLN
INTRAOCULAR | Status: DC | PRN
  Administered 2017-01-04: 0.6 mL via INTRAOCULAR

## 2017-01-04 MED ORDER — PROVISC 10 MG/ML IO SOLN
INTRAOCULAR | Status: DC | PRN
  Administered 2017-01-04: 0.85 mL via INTRAOCULAR

## 2017-01-04 MED ORDER — ONDANSETRON 4 MG PO TBDP
ORAL_TABLET | ORAL | Status: AC
  Filled 2017-01-04: qty 1

## 2017-01-04 MED ORDER — MIDAZOLAM HCL 2 MG/2ML IJ SOLN
1.0000 mg | Freq: Once | INTRAMUSCULAR | Status: AC | PRN
  Administered 2017-01-04: 2 mg via INTRAVENOUS

## 2017-01-04 MED ORDER — EPINEPHRINE PF 1 MG/ML IJ SOLN
INTRAOCULAR | Status: DC | PRN
  Administered 2017-01-04: 500 mL

## 2017-01-04 MED ORDER — LIDOCAINE HCL 3.5 % OP GEL
1.0000 "application " | Freq: Once | OPHTHALMIC | Status: AC
  Administered 2017-01-04: 1 via OPHTHALMIC

## 2017-01-04 MED ORDER — CYCLOPENTOLATE-PHENYLEPHRINE 0.2-1 % OP SOLN
1.0000 [drp] | OPHTHALMIC | Status: AC | PRN
  Administered 2017-01-04 (×3): 1 [drp] via OPHTHALMIC

## 2017-01-04 MED ORDER — LIDOCAINE HCL (PF) 1 % IJ SOLN
INTRAOCULAR | Status: DC | PRN
  Administered 2017-01-04: 1 mL

## 2017-01-04 MED ORDER — POVIDONE-IODINE 5 % OP SOLN
OPHTHALMIC | Status: DC | PRN
  Administered 2017-01-04: 1 via OPHTHALMIC

## 2017-01-04 MED ORDER — ONDANSETRON 4 MG PO TBDP
4.0000 mg | ORAL_TABLET | Freq: Once | ORAL | Status: AC
  Administered 2017-01-04: 4 mg via ORAL

## 2017-01-04 MED ORDER — BSS IO SOLN
INTRAOCULAR | Status: DC | PRN
  Administered 2017-01-04: 15 mL

## 2017-01-04 SURGICAL SUPPLY — 15 items
CLOTH BEACON ORANGE TIMEOUT ST (SAFETY) ×3 IMPLANT
EYE SHIELD UNIVERSAL CLEAR (GAUZE/BANDAGES/DRESSINGS) ×2 IMPLANT
GLOVE BIOGEL PI IND STRL 6.5 (GLOVE) ×1 IMPLANT
GLOVE BIOGEL PI IND STRL 7.0 (GLOVE) IMPLANT
GLOVE BIOGEL PI INDICATOR 6.5 (GLOVE) ×2
GLOVE BIOGEL PI INDICATOR 7.0 (GLOVE) ×2
LENS ALC ACRYL/TECN (Ophthalmic Related) ×3 IMPLANT
NDL HYPO 18GX1.5 BLUNT FILL (NEEDLE) IMPLANT
NEEDLE HYPO 18GX1.5 BLUNT FILL (NEEDLE) ×3 IMPLANT
PAD ARMBOARD 7.5X6 YLW CONV (MISCELLANEOUS) ×2 IMPLANT
SYR TB 1ML LL NO SAFETY (SYRINGE) ×2 IMPLANT
TAPE SURG TRANSPORE 1 IN (GAUZE/BANDAGES/DRESSINGS) IMPLANT
TAPE SURGICAL TRANSPORE 1 IN (GAUZE/BANDAGES/DRESSINGS) ×2
VISCOELASTIC ADDITIONAL (OPHTHALMIC RELATED) ×2 IMPLANT
WATER STERILE IRR 250ML POUR (IV SOLUTION) ×2 IMPLANT

## 2017-01-04 NOTE — Discharge Instructions (Signed)
Please discharge patient when stable, will follow up today with Dr. Marisa Hua at the [facility] office at 10:30AM.  Leave shield in place until visit.  All paperwork with discharge instructions will be given at the office.  Monitored Anesthesia Care, Care After These instructions provide you with information about caring for yourself after your procedure. Your health care provider may also give you more specific instructions. Your treatment has been planned according to current medical practices, but problems sometimes occur. Call your health care provider if you have any problems or questions after your procedure. What can I expect after the procedure? After your procedure, it is common to:  Feel sleepy for several hours.  Feel clumsy and have poor balance for several hours.  Feel forgetful about what happened after the procedure.  Have poor judgment for several hours.  Feel nauseous or vomit.  Have a sore throat if you had a breathing tube during the procedure.  Follow these instructions at home: For at least 24 hours after the procedure:   Do not: ? Participate in activities in which you could fall or become injured. ? Drive. ? Use heavy machinery. ? Drink alcohol. ? Take sleeping pills or medicines that cause drowsiness. ? Make important decisions or sign legal documents. ? Take care of children on your own.  Rest. Eating and drinking  Follow the diet that is recommended by your health care provider.  If you vomit, drink water, juice, or soup when you can drink without vomiting.  Make sure you have little or no nausea before eating solid foods. General instructions  Have a responsible adult stay with you until you are awake and alert.  Take over-the-counter and prescription medicines only as told by your health care provider.  If you smoke, do not smoke without supervision.  Keep all follow-up visits as told by your health care provider. This is  important. Contact a health care provider if:  You keep feeling nauseous or you keep vomiting.  You feel light-headed.  You develop a rash.  You have a fever. Get help right away if:  You have trouble breathing. This information is not intended to replace advice given to you by your health care provider. Make sure you discuss any questions you have with your health care provider. Document Released: 07/24/2015 Document Revised: 11/23/2015 Document Reviewed: 07/24/2015 Elsevier Interactive Patient Education  [employer].

## 2017-01-04 NOTE — Anesthesia Procedure Notes (Signed)
Procedure Name: MAC Date/Time: 01/04/2017 7:27 AM Performed by: Vista Deck Pre-anesthesia Checklist: Patient identified, Emergency Drugs available, Suction available, Timeout performed and Patient being monitored Patient Re-evaluated:Patient Re-evaluated prior to induction Oxygen Delivery Method: Nasal Cannula

## 2017-01-04 NOTE — Op Note (Signed)
Date of procedure: 01/04/17  Pre-operative diagnosis: Visually significant cataract, Right Eye  Post-operative diagnosis: Visually significant cataract, Right Eye  Procedure: Removal of cataract via phacoemulsification and insertion of intra-ocular lens AMO PCB00  +24.5D into the capsular bag of the Right Eye  Attending surgeon: Gerda Diss. Esaias Cleavenger, MD, MA  Anesthesia: MAC, Topical Akten  Complications: None  Estimated Blood Loss: <65m (minimal)  Specimens: None  Implants: As above  Indications:  Visually significant cataract, Right Eye  Procedure:  The patient was seen and identified in the pre-operative area. The operative eye was identified and dilated.  The operative eye was marked.  Topical anesthesia was administered to the operative eye.     The patient was then to the operative suite and placed in the supine position.  A timeout was performed confirming the patient, procedure to be performed, and all other relevant information.   The patient's face was prepped and draped in the usual fashion for intra-ocular surgery.  A lid speculum was placed into the operative eye and the surgical microscope moved into place and focused.  A superotemporal paracentesis was created using a 20 gauge paracentesis blade.  Shugarcaine was injected into the anterior chamber.  Viscoelastic was injected into the anterior chamber.  A temporal clear-corneal main wound incision was created using a 2.483mmicrokeratome.  A continuous curvilinear capsulorrhexis was initiated using an irrigating cystitome and completed using capsulorrhexis forceps.  Hydrodissection and hydrodeliniation were performed.  Viscoelastic was injected into the anterior chamber.  A phacoemulsification handpiece and a chopper as a second instrument were used to remove the nucleus and epinucleus. The irrigation/aspiration handpiece was used to remove any remaining cortical material.   The capsular bag was reinflated with viscoelastic,  checked, and found to be intact.  The intraocular lens was inserted into the capsular bag and dialed into place using a Kuglen hook.  The irrigation/aspiration handpiece was used to remove any remaining viscoelastic.  The clear corneal wound and paracentesis wounds were then hydrated and checked with Weck-Cels to be watertight.  The lid-speculum and drape was removed, and the patient's face was cleaned with a wet and dry 4x4.  Maxitrol was instilled in the eye before a clear shield was taped over the eye. The patient was taken to the post-operative care unit in good condition, having tolerated the procedure well.  Post-Op Instructions: [facility] and will receive all other orders and instructions.

## 2017-01-04 NOTE — H&P (Signed)
The H and P was reviewed and updated. The patient was examined.  No changes were found after exam.  The surgical eye was marked.  

## 2017-01-04 NOTE — Anesthesia Postprocedure Evaluation (Signed)
Anesthesia Post Note  Patient: [name]  Procedure(s) Performed: Procedure(s) (LRB): CATARACT EXTRACTION PHACO AND INTRAOCULAR LENS PLACEMENT (IOC) (Right)  Patient location during evaluation: Short Stay Anesthesia Type: MAC Level of consciousness: awake and alert Pain management: satisfactory to patient Vital Signs Assessment: post-procedure vital signs reviewed and stable Respiratory status: spontaneous breathing Cardiovascular status: stable Postop Assessment: no apparent nausea or vomiting Anesthetic complications: no     Last Vitals:  Vitals:   01/04/17 0645 01/04/17 0700  BP: (!) 125/44 (!) 112/46  Resp: (!) 54 (!) 22  Temp:    SpO2: 98% 97%    Last Pain:  Vitals:   01/04/17 0630  TempSrc: Oral                 Lavene Penagos

## 2017-01-04 NOTE — Patient Outreach (Signed)
[facility]) Care Management  01/04/2017  [name] January 07, 1933 [id number]  Care coordination/transition of care call #2 [facility] CM called and spoke with [name] "[name]" about her recent EMMI (CHF) flags and her right cataract extraction procedure today, 01/04/17  [name] reports "I can now see colors better" and "I will get the left one done later"   CHF- [name] confirms she continues to be followed for CHF EMMI, weighs herself daily and is becoming more aware of her intake of "foods causing me to gain" and sodium related to her weight She continues to record her weights daily and reports her weight this morning was 157 Lbs [name] reports to [facility] CM that she "loss twenty pounds after being in the hospital"  She reports being weight 154 lbs leaving the hospital on 12/21/16 [name] able to provide [facility] CM with her documented weights  12/24/16 at 725 171 lbs  12/25/16 at 0637 171 lbs  12/26/16 at 0630 176 lbs 12/27/16 at 0649 172 lbs 12/28/16 166 lbs  12/29/17 at 0615 163 lbs 12/30/17 160 lbs 12/31/16 161 lbs  01/02/17 163 lbs 01/03/17 at 0648 159 lbs  [name] continues not to agree to home visits but agrees to allow [facility] CM to call her at intervals to check on her progress "I'm okay really.  I still have your number to call you if I need to." [name] counseled on monitoring her weight, use of diuretics as ordered, monitor her intake of foods containing sodium and CHF action plan/levels. [name] voiced understanding  [name] reports frequent visits from her daughters and son.  Denies other medical concerns. States she has all her medications and doctor appointments scheduled  [name] reports that she is getting compression "Velcour" hose from "[employer] soon" that was ordered when [facility] CM inquired about lymphedema treatment [name] stated "They will come out maybe once to teach me how to put them on, " when [facility] CM  offered to assist.  [name] unable to confirm a name of a home health agency when [facility] CM inquired.    Plans: [facility] CM will call to follow up with [name] at intervals for transition of care and other needs that may arise  Suleima Ohlendorf L. Lavina Hamman, RN, BSN, [facility] Management [phone]

## 2017-01-04 NOTE — Anesthesia Preprocedure Evaluation (Signed)
Anesthesia Evaluation    Airway       Dental   Pulmonary former smoker,          Cardiovascular hypertension,     Neuro/Psych    GI/Hepatic   Endo/Other    Renal/GU      Musculoskeletal   Abdominal   Peds  Hematology   Anesthesia Other Findings   Reproductive/Obstetrics                          Anesthesia Physical Anesthesia Plan Anesthesia Quick Evaluation  

## 2017-01-04 NOTE — Transfer of Care (Signed)
Immediate Anesthesia Transfer of Care Note  Patient: [name]  Procedure(s) Performed: Procedure(s) (LRB): CATARACT EXTRACTION PHACO AND INTRAOCULAR LENS PLACEMENT (IOC) (Right)  Patient Location: Shortstay  Anesthesia Type: MAC  Level of Consciousness: awake  Airway & Oxygen Therapy: Patient Spontanous Breathing   Post-op Assessment: Report given to PACU RN, Post -op Vital signs reviewed and stable and Patient moving all extremities  Post vital signs: Reviewed and stable  Complications: No apparent anesthesia complications

## 2017-01-04 NOTE — Anesthesia Preprocedure Evaluation (Addendum)
Anesthesia Evaluation  Patient identified by MRN, date of birth, ID band Patient awake    Reviewed: Allergy & Precautions, NPO status , Patient's Chart, lab work & pertinent test results, reviewed documented beta blocker date and time   Airway Mallampati: I       Dental  (+) Edentulous Upper, Edentulous Lower   Pulmonary former smoker,    Pulmonary exam normal        Cardiovascular METS: 3 - Mets hypertension, Pt. on medications + CAD, + Peripheral Vascular Disease and +CHF (stable)   Rhythm:Regular Rate:Normal  Study Conclusions May 2018 - Left ventricle: The cavity size was normal. Wall thickness was   increased in a pattern of mild LVH. Systolic function was normal.   The estimated ejection fraction was 60%. Wall motion was normal  Patient denies any recent CP/SOB    Neuro/Psych July 2015 Patent right and left carotid arteries    GI/Hepatic   Endo/Other    Renal/GU Results for [name], [name] (MRN [id number]) as of 01/04/2017 06:35  12/20/2016 04:40 BUN: 18 Creatinine: 1.13 (H)      Musculoskeletal   Abdominal Normal abdominal exam  (+)   Peds  Hematology  (+) anemia , No recent Hgb/Hct   Anesthesia Other Findings   Reproductive/Obstetrics                            Anesthesia Physical Anesthesia Plan  ASA: III  Anesthesia Plan: MAC   Post-op Pain Management:    Induction:   PONV Risk Score and Plan: Ondansetron and Midazolam  Airway Management Planned: Nasal Cannula  Additional Equipment:   Intra-op Plan:   Post-operative Plan:   Informed Consent: I have reviewed the patients History and Physical, chart, labs and discussed the procedure including the risks, benefits and alternatives for the proposed anesthesia with the patient or authorized representative who has indicated his/her understanding and acceptance.     Plan Discussed with: CRNA  Anesthesia Plan  Comments:         Anesthesia Quick Evaluation

## 2017-01-07 ENCOUNTER — Encounter (HOSPITAL_COMMUNITY): Payer: Self-pay | Admitting: Ophthalmology

## 2017-01-08 ENCOUNTER — Other Ambulatory Visit: Payer: Self-pay | Admitting: Cardiology

## 2017-01-08 ENCOUNTER — Ambulatory Visit: Payer: Self-pay | Admitting: Adult Health

## 2017-01-08 NOTE — Progress Notes (Signed)
Cardiology Office Note    Date:  01/09/2017   ID:  [name], DOB Nov 23, 1932, MRN [id number]  PCP:  [name], MD  Cardiologist: Dr. [name]  No chief complaint on file.   History of Present Illness:  [name] is a 83 y.o. female with history of lymphedema despite high-dose diuretics. She has had trouble with renal insufficiency. Last saw Dr. [name] 12/26/16 who added metolazone 2.5 mg 3 times a week then twice a week and once a week. Also enrolled her in the lymphedema treatment through physical therapy in [place]. She is not using the mechanical compression consistently.  Patient also has chronic atrial fibrillation on Coumadin, CAD status post stent RCA  2003 with moderate residual circumflex and OM disease, and history of blood loss anemia with gastric AVMs.  Patient comes in today accompanied by her daughter. Her weight is down to 161 pounds today, was 177 in the hospital. It had gotten down to 157 pounds last week. She took her last metolazone dose today. She has an appointment with the lymphedema clinic today but is doing compression at home and is getting the wrap so she can wrap her legs herself. She did prefer not to go to the lymphedema clinic any longer because she can't drive herself. She denies any chest pain, palpitations, dizziness or presyncope. Her daughter says she gets short of breath while walking to the mailbox.    Past Medical History:  Diagnosis Date  . Angiodysplasia of stomach and duodenum   . Carotid artery disease ([place])    RIght CEA 05/2004  . Chronic anticoagulation   . Chronic atrial fibrillation ([place])   . Chronic blood loss anemia   . Chronic diastolic CHF (congestive heart failure) ([place])   . Coronary atherosclerosis of native coronary artery    a. RCA stent 2003 with 60% LCx, 60% OM1, 80% OM2 disease at that time.  . Diverticulosis   . Essential hypertension   . Gastric ulcer   . GI bleeding    Recurrent GIB (prior  gastric ulcer/diverticulosis/AVM in 2017, gastric AVM s/p endoscopic therapy in 05/2016, recurrent GIB 06/2016 with AVM s/p therapy on endoscopy.  . Hyperlipidemia   . Lymphedema   . Obesity   . Renal artery stenosis (HCC)    Bilateral renal artery stents 2004  . Tobacco abuse, in remission    40 pack year stopped in 1989    Past Surgical History:  Procedure Laterality Date  . APPENDECTOMY    . CAROTID ENDARTERECTOMY     Right in 2005, left in 2010  . CATARACT EXTRACTION W/PHACO Right 01/04/2017   Procedure: CATARACT EXTRACTION PHACO AND INTRAOCULAR LENS PLACEMENT (IOC);  Surgeon: Baruch Goldmann, MD;  Location: [facility] ORS;  Service: Ophthalmology;  Laterality: Right;  CDE: 30.25  . COLONOSCOPY N/A 08/19/2015   Procedure: COLONOSCOPY;  Surgeon: Rogene Houston, MD;  Location: [facility] ENDO SUITE;  Service: Endoscopy;  Laterality: N/A;  7:30  . CORONARY ANGIOPLASTY    . ESOPHAGOGASTRODUODENOSCOPY N/A 08/19/2015   Procedure: ESOPHAGOGASTRODUODENOSCOPY (EGD);  Surgeon: Rogene Houston, MD;  Location: [facility];  Service: Endoscopy;  Laterality: N/A;  . ESOPHAGOGASTRODUODENOSCOPY N/A 05/24/2016   Procedure: ESOPHAGOGASTRODUODENOSCOPY (EGD);  Surgeon: Rogene Houston, MD;  Location: [facility];  Service: Endoscopy;  Laterality: N/A;  . ESOPHAGOGASTRODUODENOSCOPY N/A 06/19/2016   Procedure: ESOPHAGOGASTRODUODENOSCOPY (EGD);  Surgeon: Rogene Houston, MD;  Location: [facility];  Service: Endoscopy;  Laterality: N/A;  . ESOPHAGOGASTRODUODENOSCOPY N/A 09/05/2016  Procedure: ESOPHAGOGASTRODUODENOSCOPY (EGD);  Surgeon: Rogene Houston, MD;  Location: [facility];  Service: Endoscopy;  Laterality: N/A;  . GIVENS CAPSULE STUDY N/A 06/20/2016   Procedure: GIVENS CAPSULE STUDY;  Surgeon: Rogene Houston, MD;  Location: [facility];  Service: Endoscopy;  Laterality: N/A;    Current Medications: Current Meds  Medication Sig  . acetaminophen (TYLENOL) 500 MG tablet Take 500 mg by mouth every 6 (six) hours as  needed for headache.  Marland Kitchen atorvastatin (LIPITOR) 80 MG tablet TAKE 1 TABLET EVERY NIGHT AT BEDTIME  . bisoprolol (ZEBETA) 5 MG tablet take 1 tablet by mouth twice a day  . metolazone (ZAROXOLYN) 2.5 MG tablet Take 2.5 mg daily as directed. (Patient taking differently: Take 2.5 mg by mouth once a week. Take 2.5 mg daily as directed.)  . Multiple Vitamin (MULTIVITAMIN) capsule Take 1 capsule by mouth daily.  . pantoprazole (PROTONIX) 40 MG tablet Take 1 tablet (40 mg total) by mouth 2 (two) times daily before a meal.  . potassium chloride SA (K-DUR,KLOR-CON) 20 MEQ tablet Take 40 meq (2 tablets) in the am , and take 20 meq (1 tablet) in the pm (Patient taking differently: Take 20-40 mEq by mouth See admin instructions. Take 40 meq (2 tablets) in the am , and take 20 meq (1 tablet) in the pm)  . torsemide (DEMADEX) 20 MG tablet Take 4 tablets (80 mg total) by mouth 2 (two) times daily.  . verapamil (CALAN) 80 MG tablet Take 1 tablet (80 mg total) by mouth 2 (two) times daily.  Marland Kitchen warfarin (COUMADIN) 5 MG tablet Take as directed by coumadin clinic     Allergies:   Diltiazem   Social History   Social History  . Marital status: Widowed    Spouse name: N/A  . Number of children: 4  . Years of education: N/A   Occupational History  . housewife Retired   Social History Main Topics  . Smoking status: Former Smoker    Types: Cigarettes    Quit date: 04/30/1987  . Smokeless tobacco: Never Used  . Alcohol use No  . Drug use: No  . Sexual activity: Not Asked   Other Topics Concern  . None   Social History Narrative  . None     Family History:  The patient's family history includes Cancer in her mother; Heart disease in her maternal grandmother; Hyperlipidemia in her daughter; Hypertension in her daughter, daughter, and son; Lung cancer in her mother.   ROS:   Please see the history of present illness.    Review of Systems  Constitution: Negative.  HENT: Positive for hearing loss.     Eyes: Positive for visual disturbance.  Cardiovascular: Positive for irregular heartbeat and leg swelling.  Respiratory: Positive for snoring.   Hematologic/Lymphatic: Bruises/bleeds easily.  Musculoskeletal: Negative.  Negative for joint pain.  Gastrointestinal: Positive for constipation.  Genitourinary: Negative.   Neurological: Positive for dizziness and headaches.   All other systems reviewed and are negative.   PHYSICAL EXAM:   VS:  BP (!) 140/58   Pulse 80   Ht 5\' 3"  (1.6 m)   Wt 161 lb (73 kg)   SpO2 94%   BMI 28.52 kg/m   Physical Exam  GEN: Well nourished, well developed, in no acute distress  Neck: Increased JVD, no carotid bruits, or masses Cardiac: Irregular irregular; 2/6 systolic murmur at left sternal border Respiratory:  Decreased breath sounds but clear to auscultation bilaterally, normal work of breathing  GI: soft, nontender, nondistended, + BS Ext: Lymphedema bilaterally actually looks better than before Neuro:  Alert and Oriented x 3 Psych: euthymic mood, full affect  Wt Readings from Last 3 Encounters:  01/09/17 161 lb (73 kg)  12/26/16 177 lb (80.3 kg)  12/26/16 177 lb (80.3 kg)      Studies/Labs Reviewed:   EKG:  EKG is notordered today.    Recent Labs: 12/18/2016: ALT 45; B Natriuretic Peptide 538.0; Hemoglobin 9.3; Magnesium 1.6; Platelets 203; TSH 0.987 12/20/2016: BUN 18; Creatinine, Ser 1.13; Potassium 3.1; Sodium 137   Lipid Panel    Component Value Date/Time   CHOL 91 (L) 12/22/2014 1035   TRIG 79 12/22/2014 1035   HDL 34 (L) 12/22/2014 1035   CHOLHDL 2.7 12/22/2014 1035   VLDL 16 12/22/2014 1035   LDLCALC 41 12/22/2014 1035    Additional studies/ records that were reviewed today include:   Echocardiogram 08/14/2016 Left ventricle: The cavity size was normal. Wall thickness was   increased in a pattern of mild LVH. Systolic function was normal.   The estimated ejection fraction was 60%. Wall motion was normal;   there were no  regional wall motion abnormalities. The study is   not technically sufficient to allow evaluation of LV diastolic   function. - Aortic valve: Mildly to moderately calcified annulus. Trileaflet;   mildly calcified leaflets. There was no stenosis. - Mitral valve: Mildly thickened leaflets . There was mild   regurgitation. - Left atrium: The atrium was moderately dilated. - Right ventricle: The cavity size was mildly dilated. Systolic   function was mildly reduced. TAPSE: 14.3 mm . - Right atrium: The atrium was severely dilated. - Atrial septum: No defect or patent foramen ovale was identified. - Tricuspid valve: There was moderate-severe regurgitation. - Pulmonary arteries: PA peak pressure: 41 mm Hg (S). - Inferior vena cava: The vessel was dilated. The respirophasic   diameter changes were blunted (< 50%), consistent with elevated   central venous pressure. Estimated CVP 15 mmHg.      ASSESSMENT:    1. Acute on chronic diastolic congestive heart failure (HCC)   2. Lymphedema   3. Chronic atrial fibrillation ([place])   4. Essential hypertension   5. Atherosclerosis of native coronary artery of native heart without angina pectoris      PLAN:  In order of problems listed above:  Acute on chronic diastolic CHF echo 0/1751 normal LVEF, couldn't assess diastolic function. Weight 177 lbs 9/12/18Today 161 pounds. Will check renal function. Continue metolazone 2.5 mg once a week and may take an extra for 5 pound weight gain. Continued Demadex 80 mg twice a day. Take an extra potassium 20 mEq on the days she takes metolazone. Follow-up with Dr. [name] in 2-3 weeks.  Lymphedema going to the lymphedema clinic today and will discuss whether or not she needs to continue if she wraps her legs and uses mechanical compression twice a day.  Chronic atrial fibrillation rate controlled with verapamil bisoprolol also on Coumadin  Essential hypertension blood pressure stable  CAD with previous  stent to the RCA in 2003 with residual circumflex and OM1 and OM 2 disease treated medically    Medication Adjustments/Labs and Tests Ordered: Current medicines are reviewed at length with the patient today.  Concerns regarding medicines are outlined above.  Medication changes, Labs and Tests ordered today are listed in the Patient Instructions below. There are no Patient Instructions on file for this visit.   Signed, Ermalinda Barrios, PA-C  01/09/2017 11:49 AM    [address], [place], [place]  [address] Phone: [phone]; Fax: [phone]

## 2017-01-09 ENCOUNTER — Ambulatory Visit: Payer: Self-pay | Admitting: Physician Assistant

## 2017-01-09 ENCOUNTER — Other Ambulatory Visit: Payer: Self-pay | Admitting: Cardiology

## 2017-01-09 ENCOUNTER — Ambulatory Visit (INDEPENDENT_AMBULATORY_CARE_PROVIDER_SITE_OTHER): Payer: Medicare Other | Admitting: Physician Assistant

## 2017-01-09 ENCOUNTER — Ambulatory Visit (HOSPITAL_COMMUNITY): Payer: Self-pay | Admitting: Physical Therapy

## 2017-01-09 ENCOUNTER — Encounter: Payer: Self-pay | Admitting: Physician Assistant

## 2017-01-09 ENCOUNTER — Ambulatory Visit (HOSPITAL_COMMUNITY): Payer: Medicare Other | Attending: Cardiology | Admitting: Physical Therapy

## 2017-01-09 ENCOUNTER — Ambulatory Visit (INDEPENDENT_AMBULATORY_CARE_PROVIDER_SITE_OTHER): Payer: Medicare Other | Admitting: *Deleted

## 2017-01-09 VITALS — BP 140/58 | HR 80 | Ht 63.0 in | Wt 161.0 lb

## 2017-01-09 DIAGNOSIS — I482 Chronic atrial fibrillation, unspecified: Secondary | ICD-10-CM

## 2017-01-09 DIAGNOSIS — I5033 Acute on chronic diastolic (congestive) heart failure: Secondary | ICD-10-CM

## 2017-01-09 DIAGNOSIS — I10 Essential (primary) hypertension: Secondary | ICD-10-CM | POA: Diagnosis not present

## 2017-01-09 DIAGNOSIS — Z5181 Encounter for therapeutic drug level monitoring: Secondary | ICD-10-CM

## 2017-01-09 DIAGNOSIS — I890 Lymphedema, not elsewhere classified: Secondary | ICD-10-CM

## 2017-01-09 DIAGNOSIS — I2510 Atherosclerotic heart disease of native coronary artery without angina pectoris: Secondary | ICD-10-CM

## 2017-01-09 DIAGNOSIS — I4891 Unspecified atrial fibrillation: Secondary | ICD-10-CM | POA: Diagnosis not present

## 2017-01-09 LAB — POCT INR: INR: 2.3

## 2017-01-09 NOTE — Therapy (Signed)
[facility] [address] [place], [place], [address] Phone: [phone]   Fax:  [phone]  Physical Therapy Evaluation  Patient Details  Name: [name] MRN: [id number] Date of Birth: Oct 03, 1932 Referring Provider: Rozann Lesches   Encounter Date: 01/09/2017      PT End of Session - 01/09/17 1409    Visit Number 1   Number of Visits 1   Authorization - Visit Number 1   Authorization - Number of Visits 1   PT Start Time 7169   PT Stop Time 1400   PT Time Calculation (min) 55 min   Activity Tolerance Patient tolerated treatment well   Behavior During Therapy [facility] for tasks assessed/performed      Past Medical History:  Diagnosis Date  . Angiodysplasia of stomach and duodenum   . Carotid artery disease ([place])    RIght CEA 05/2004  . Chronic anticoagulation   . Chronic atrial fibrillation ([place])   . Chronic blood loss anemia   . Chronic diastolic CHF (congestive heart failure) ([place])   . Coronary atherosclerosis of native coronary artery    a. RCA stent 2003 with 60% LCx, 60% OM1, 80% OM2 disease at that time.  . Diverticulosis   . Essential hypertension   . Gastric ulcer   . GI bleeding    Recurrent GIB (prior gastric ulcer/diverticulosis/AVM in 2017, gastric AVM s/p endoscopic therapy in 05/2016, recurrent GIB 06/2016 with AVM s/p therapy on endoscopy.  . Hyperlipidemia   . Lymphedema   . Obesity   . Renal artery stenosis ([facility])    Bilateral renal artery stents 2004  . Tobacco abuse, in remission    40 pack year stopped in 1989    Past Surgical History:  Procedure Laterality Date  . APPENDECTOMY    . CAROTID ENDARTERECTOMY     Right in 2005, left in 2010  . CATARACT EXTRACTION W/PHACO Right 01/04/2017   Procedure: CATARACT EXTRACTION PHACO AND INTRAOCULAR LENS PLACEMENT (IOC);  Surgeon: Baruch Goldmann, MD;  Location: [facility];  Service: Ophthalmology;  Laterality: Right;  CDE: 30.25  . COLONOSCOPY N/A 08/19/2015   Procedure: COLONOSCOPY;  Surgeon: Rogene Houston, MD;  Location: [facility];  Service: Endoscopy;  Laterality: N/A;  7:30  . CORONARY ANGIOPLASTY    . ESOPHAGOGASTRODUODENOSCOPY N/A 08/19/2015   Procedure: ESOPHAGOGASTRODUODENOSCOPY (EGD);  Surgeon: Rogene Houston, MD;  Location: [facility];  Service: Endoscopy;  Laterality: N/A;  . ESOPHAGOGASTRODUODENOSCOPY N/A 05/24/2016   Procedure: ESOPHAGOGASTRODUODENOSCOPY (EGD);  Surgeon: Rogene Houston, MD;  Location: [facility];  Service: Endoscopy;  Laterality: N/A;  . ESOPHAGOGASTRODUODENOSCOPY N/A 06/19/2016   Procedure: ESOPHAGOGASTRODUODENOSCOPY (EGD);  Surgeon: Rogene Houston, MD;  [facility];  Service: Endoscopy;  Laterality: N/A;  . ESOPHAGOGASTRODUODENOSCOPY N/A 09/05/2016   Procedure: ESOPHAGOGASTRODUODENOSCOPY (EGD);  Surgeon: Rogene Houston, MD;  [facility];  Service: Endoscopy;  Laterality: N/A;  . GIVENS CAPSULE STUDY N/A 06/20/2016   Procedure: GIVENS CAPSULE STUDY;  Surgeon: Rogene Houston, MD;  Location: [facility];  Service: Endoscopy;  Laterality: N/A;    There were no vitals filed for this visit.       Subjective Assessment - 01/09/17 1404    Subjective Pt states was seen on 11/21/2016 for an evaluation and refused to come for further treatments due to transportation issues.  Her legs continued to swell and she was hospitalized on 12/18/2016 with a 20 pound weight gain.   She is  now once again being referred to skilled physical therapy.  She states that she has not recieved her compression garments but they are ordered,(therapist referred pt for compression stockings last evaluation.  Due to the contour of her legs she needed custom made garments which take 3-4 weeks), She has recieved her pump and states that she is using it twice a day.  [name] states that she is still not willing to come for manual  treatments.    Patient is accompained by: Family member   Pertinent History Afib,  hyperlipedemia, HTN.    Limitations Standing;Walking;House hold activities   How long can you walk comfortably? less than five minutes    Currently in Pain? No/denies            [facility] PT Assessment - 01/09/17 0001      Assessment   Medical Diagnosis B LE Lymphedema   Referring Provider Rozann Lesches    Onset Date/Surgical Date 11/29/15   Next MD Visit unknown    Prior Therapy none     Precautions   Precautions None     Restrictions   Weight Bearing Restrictions No     Balance Screen   Has the patient fallen in the past 6 months No   Has the patient had a decrease in activity level because of a fear of falling?  No   Is the patient reluctant to leave their home because of a fear of falling?  No     Home Ecologist residence     Prior Function   Level of Independence Independent     Cognition   Overall Cognitive Status Within Functional Limits for tasks assessed           LYMPHEDEMA/ONCOLOGY QUESTIONNAIRE - 01/09/17 1303      Date Lymphedema/Swelling Started   Date (P)  11/19/15     What other symptoms do you have   Are you Having Heaviness or Tightness (P)  Yes   Are you having Pain (P)  No   Are you having pitting edema (P)  Yes   Is it Hard or Difficult finding clothes that fit (P)  Yes   Do you have infections (P)  No   Stemmer Sign (P)  No     Lymphedema Stage   Stage (P)  STAGE 2 SPONTANEOUSLY IRREVERSIBLE     Lymphedema Assessments   Lymphedema Assessments (P)  Lower extremities     Right Lower Extremity Lymphedema   At Midpatella/Popliteal Crease (P)  44.3 cm   30 cm Proximal to Floor at Lateral Plantar Foot (P)  42.4 cm   20 cm Proximal to Floor at Lateral Plantar Foot (P)  37.5 1   10  cm Proximal to Floor at Lateral Malleoli (P)  30.3 cm   Circumference of ankle/heel (P)  37.5 cm.   5 cm Proximal to 1st MTP Joint (P)  24.8 cm   Across MTP Joint (P)  26 cm   Around Proximal Great Toe (P)  9.5 cm     Left  Lower Extremity Lymphedema   At Midpatella/Popliteal Crease (P)  41.2 cm   30 cm Proximal to Floor at Lateral Plantar Foot (P)  43.5 cm   20 cm Proximal to Floor at Lateral Plantar Foot (P)  36.9 cm   10 cm Proximal to Floor at Lateral Malleoli (P)  29.5 cm   Circumference of ankle/heel (P)  37 cm.   5 cm Proximal to 1st  MTP Joint (P)  25.1 cm   Across MTP Joint (P)  26.8 cm   Around Proximal Great Toe (P)  9.7 cm         Objective measurements completed on examination: See above findings.          [facility] Adult PT Treatment/Exercise - Jan 21, 2017 0001      Manual Therapy   Manual Therapy Manual Lymphatic Drainage (MLD);Compression Bandaging   Manual therapy comments done seperate from all other aspects of treatment   Manual Lymphatic Drainage (MLD) To include supraclavicular, deep and superficial abdominal, routing fluid using inguinal/axillary anastomosis and LE's.                  PT Education - January 21, 2017 1408    Education provided Yes   Education Details Self massage to increase lymphedema circulation   Person(s) Educated Patient   Methods Explanation;Handout   Comprehension Verbalized understanding;Returned demonstration          PT Short Term Goals - 2017/01/21 1416      PT SHORT TERM GOAL #1   Title PT to be I in self decogestion techniques    Time 1   Period Days   Status New   Target Date 2017/01/21                         Plan - 01/21/2017 1410    Clinical Impression Statement Ms Revelle is an 83 yo female diagnosed with lymphedema.  She was referred to this clinic last month and stated that she would only come for the evaluation.  The therapist gave Ms. Joy exercises to improve her lymph circulation, sent our for a compression pump which she has recieved and is using and sent our for compression garment which she has been measured for but has not obtained.  Ms. Folker states that she is not going to return.  Her volumes were measured  and they are down from last month, she was educated in self manual techniques and the therapist completed manual treatment today.    Clinical Presentation Stable   PT Frequency 1x / week   PT Duration --  one treatment only per pt wishes   PT Treatment/Interventions ADLs/Self Care Home Management;Patient/family education   PT Next Visit Plan Discharge pt to home self manual techniques.    Consulted and Agree with Plan of Care Patient      Patient will benefit from skilled therapeutic intervention in order to improve the following deficits and impairments:  Abnormal gait, Decreased activity tolerance, Increased edema, Difficulty walking  Visit Diagnosis: Lymphedema, not elsewhere classified      G-Codes - Jan 21, 2017 1416    Functional Assessment Tool Used (Outpatient Only) life impact survey   Functional Limitation Other PT primary   Other PT Primary Current Status (R5188) At least 40 percent but less than 60 percent impaired, limited or restricted   Other PT Primary Goal Status (C1660) At least 40 percent but less than 60 percent impaired, limited or restricted   Other PT Primary Discharge Status (Y3016) At least 40 percent but less than 60 percent impaired, limited or restricted       Problem List Patient Active Problem List   Diagnosis Date Noted  . CHF, acute on chronic ([place]) 12/18/2016  . Urinary tract infection without hematuria   . GI bleed 12/08/2016  . Asymptomatic bacteriuria 12/08/2016  . Lymphedema 11/14/2016  . CKD (chronic kidney disease) stage 3, GFR 30-59  ml/min 09/05/2016  . Hypokalemia 09/05/2016  . CHF exacerbation ([place]) 08/25/2016  . Difficulty in walking, not elsewhere classified   . Dizziness and giddiness   . Gastrointestinal hemorrhage associated with peptic ulcer   . Renal impairment   . Acute blood loss anemia 05/22/2016  . Chronic blood loss anemia 05/22/2016  . Acute on chronic diastolic CHF (congestive heart failure) ([place]) 05/22/2016  . Acute  renal failure superimposed on stage 3 chronic kidney disease ([place]) 05/22/2016  . Anticoagulated on Coumadin   . Symptomatic anemia 07/11/2015  . Microcytic anemia 07/11/2015  . Fatigue 07/11/2015  . Occlusion and stenosis of carotid artery without mention of cerebral infarction-Bilateral 11/11/2013  . Aftercare following surgery of the circulatory system, [place] 11/11/2013  . Varicose veins of bilateral lower extremities with other complications 61/22/4497  . Encounter for therapeutic drug monitoring 05/18/2013  . Fasting hyperglycemia 07/03/2012  . Chronic anticoagulation   . Tobacco abuse, in remission   . Carotid artery occlusion   . Hypertension   . Obesity   . Hyperlipidemia   . Chronic venous insufficiency 07/26/2010  . Chronic atrial fibrillation ([place]) 04/05/2010  . Coronary atherosclerosis of native coronary artery 04/05/2010  . PERIPHERAL VASCULAR DISEASE 04/05/2010    Rayetta Humphrey, PT CLT [phone] 01/09/2017, 2:19 PM  [facility] [address] [place], [place], [address] Phone: [phone]   Fax:  [phone]  Name: TONGA PROUT MRN: [id number] Date of Birth: Aug 27, 1932

## 2017-01-09 NOTE — Patient Instructions (Signed)
Medication Instructions:  Your physician has recommended you make the following change in your medication:  Take Metolazone 2.5 mg Weekly  Take Extra Potassium on the Days you take Metolazone    Labwork: NONE   Testing/Procedures: NONE   Follow-Up: Your physician recommends that you schedule a follow-up appointment in: 2 Weeks    Any Other Special Instructions Will Be Listed Below (If Applicable).     If you need a refill on your cardiac medications before your next appointment, please call your pharmacy. Thank you for choosing [facility]!

## 2017-01-14 ENCOUNTER — Telehealth: Payer: Self-pay | Admitting: Cardiology

## 2017-01-14 ENCOUNTER — Other Ambulatory Visit: Payer: Self-pay

## 2017-01-14 MED ORDER — VERAPAMIL HCL 80 MG PO TABS: 80.0000 mg | ORAL_TABLET | Freq: Two times a day (BID) | ORAL | 6 refills | Status: AC

## 2017-01-14 NOTE — Telephone Encounter (Signed)
Patient would like to discuss medication refill. Patient does not know what medication it is. States that she spoke to someone on Friday but there was no note on that. / tg

## 2017-01-14 NOTE — Telephone Encounter (Signed)
Pt needed a refill on verapamil. Sent in to [facility].

## 2017-01-16 LAB — BASIC METABOLIC PANEL WITH GFR
BUN/Creatinine Ratio: 24 (calc) — ABNORMAL HIGH (ref 6–22)
BUN: 30 mg/dL — ABNORMAL HIGH (ref 7–25)
CALCIUM: 9.5 mg/dL (ref 8.6–10.4)
CO2: 33 mmol/L — AB (ref 20–32)
Chloride: 92 mmol/L — ABNORMAL LOW (ref 98–110)
Creat: 1.27 mg/dL — ABNORMAL HIGH (ref 0.60–0.88)
GFR, Est African American: 46 mL/min/{1.73_m2} — ABNORMAL LOW (ref >=60)
GFR, Est Non African American: 39 mL/min/{1.73_m2} — ABNORMAL LOW (ref >=60)
GLUCOSE: 146 mg/dL — AB (ref 65–139)
POTASSIUM: 4.5 mmol/L (ref 3.5–5.3)
SODIUM: 138 mmol/L (ref 135–146)

## 2017-01-17 ENCOUNTER — Encounter (HOSPITAL_COMMUNITY): Payer: Self-pay | Admitting: *Deleted

## 2017-01-17 ENCOUNTER — Inpatient Hospital Stay (HOSPITAL_COMMUNITY): Payer: Medicare Other

## 2017-01-17 ENCOUNTER — Inpatient Hospital Stay (HOSPITAL_COMMUNITY)
Admission: EM | Admit: 2017-01-17 | Discharge: 2017-02-14 | DRG: 394 | Disposition: E | Payer: Medicare Other | Attending: Internal Medicine | Admitting: Internal Medicine

## 2017-01-17 ENCOUNTER — Telehealth (INDEPENDENT_AMBULATORY_CARE_PROVIDER_SITE_OTHER): Payer: Self-pay | Admitting: *Deleted

## 2017-01-17 ENCOUNTER — Telehealth: Payer: Self-pay | Admitting: *Deleted

## 2017-01-17 DIAGNOSIS — R791 Abnormal coagulation profile: Secondary | ICD-10-CM | POA: Diagnosis present

## 2017-01-17 DIAGNOSIS — I130 Hypertensive heart and chronic kidney disease with heart failure and stage 1 through stage 4 chronic kidney disease, or unspecified chronic kidney disease: Secondary | ICD-10-CM | POA: Diagnosis present

## 2017-01-17 DIAGNOSIS — R6510 Systemic inflammatory response syndrome (SIRS) of non-infectious origin without acute organ dysfunction: Secondary | ICD-10-CM | POA: Diagnosis present

## 2017-01-17 DIAGNOSIS — K625 Hemorrhage of anus and rectum: Secondary | ICD-10-CM

## 2017-01-17 DIAGNOSIS — N183 Chronic kidney disease, stage 3 unspecified: Secondary | ICD-10-CM | POA: Diagnosis present

## 2017-01-17 DIAGNOSIS — N179 Acute kidney failure, unspecified: Secondary | ICD-10-CM

## 2017-01-17 DIAGNOSIS — I5032 Chronic diastolic (congestive) heart failure: Secondary | ICD-10-CM | POA: Diagnosis present

## 2017-01-17 DIAGNOSIS — E871 Hypo-osmolality and hyponatremia: Secondary | ICD-10-CM | POA: Diagnosis present

## 2017-01-17 DIAGNOSIS — Z87891 Personal history of nicotine dependence: Secondary | ICD-10-CM | POA: Diagnosis not present

## 2017-01-17 DIAGNOSIS — T45511A Poisoning by anticoagulants, accidental (unintentional), initial encounter: Secondary | ICD-10-CM | POA: Diagnosis present

## 2017-01-17 DIAGNOSIS — Z515 Encounter for palliative care: Secondary | ICD-10-CM | POA: Diagnosis not present

## 2017-01-17 DIAGNOSIS — E875 Hyperkalemia: Secondary | ICD-10-CM | POA: Diagnosis present

## 2017-01-17 DIAGNOSIS — K559 Vascular disorder of intestine, unspecified: Secondary | ICD-10-CM | POA: Diagnosis not present

## 2017-01-17 DIAGNOSIS — E860 Dehydration: Secondary | ICD-10-CM | POA: Diagnosis present

## 2017-01-17 DIAGNOSIS — D72829 Elevated white blood cell count, unspecified: Secondary | ICD-10-CM

## 2017-01-17 DIAGNOSIS — Z8249 Family history of ischemic heart disease and other diseases of the circulatory system: Secondary | ICD-10-CM

## 2017-01-17 DIAGNOSIS — E872 Acidosis, unspecified: Secondary | ICD-10-CM | POA: Diagnosis present

## 2017-01-17 DIAGNOSIS — Z801 Family history of malignant neoplasm of trachea, bronchus and lung: Secondary | ICD-10-CM | POA: Diagnosis not present

## 2017-01-17 DIAGNOSIS — Z955 Presence of coronary angioplasty implant and graft: Secondary | ICD-10-CM

## 2017-01-17 DIAGNOSIS — K922 Gastrointestinal hemorrhage, unspecified: Secondary | ICD-10-CM

## 2017-01-17 DIAGNOSIS — D62 Acute posthemorrhagic anemia: Secondary | ICD-10-CM

## 2017-01-17 DIAGNOSIS — R310 Gross hematuria: Secondary | ICD-10-CM | POA: Diagnosis present

## 2017-01-17 DIAGNOSIS — Z66 Do not resuscitate: Secondary | ICD-10-CM | POA: Diagnosis present

## 2017-01-17 DIAGNOSIS — I482 Chronic atrial fibrillation, unspecified: Secondary | ICD-10-CM | POA: Diagnosis present

## 2017-01-17 DIAGNOSIS — K2950 Unspecified chronic gastritis without bleeding: Secondary | ICD-10-CM | POA: Diagnosis present

## 2017-01-17 DIAGNOSIS — T45515A Adverse effect of anticoagulants, initial encounter: Secondary | ICD-10-CM | POA: Diagnosis present

## 2017-01-17 DIAGNOSIS — Z7189 Other specified counseling: Secondary | ICD-10-CM | POA: Diagnosis not present

## 2017-01-17 DIAGNOSIS — Z7901 Long term (current) use of anticoagulants: Secondary | ICD-10-CM | POA: Diagnosis not present

## 2017-01-17 DIAGNOSIS — R109 Unspecified abdominal pain: Secondary | ICD-10-CM

## 2017-01-17 DIAGNOSIS — Z8711 Personal history of peptic ulcer disease: Secondary | ICD-10-CM

## 2017-01-17 DIAGNOSIS — Z888 Allergy status to other drugs, medicaments and biological substances status: Secondary | ICD-10-CM

## 2017-01-17 DIAGNOSIS — K5730 Diverticulosis of large intestine without perforation or abscess without bleeding: Secondary | ICD-10-CM | POA: Diagnosis present

## 2017-01-17 DIAGNOSIS — I700 Atherosclerosis of aorta: Secondary | ICD-10-CM | POA: Diagnosis not present

## 2017-01-17 DIAGNOSIS — I2510 Atherosclerotic heart disease of native coronary artery without angina pectoris: Secondary | ICD-10-CM | POA: Diagnosis present

## 2017-01-17 DIAGNOSIS — D649 Anemia, unspecified: Secondary | ICD-10-CM | POA: Diagnosis not present

## 2017-01-17 DIAGNOSIS — R0603 Acute respiratory distress: Secondary | ICD-10-CM | POA: Diagnosis not present

## 2017-01-17 DIAGNOSIS — I959 Hypotension, unspecified: Secondary | ICD-10-CM | POA: Diagnosis not present

## 2017-01-17 DIAGNOSIS — E876 Hypokalemia: Secondary | ICD-10-CM | POA: Diagnosis not present

## 2017-01-17 DIAGNOSIS — G253 Myoclonus: Secondary | ICD-10-CM | POA: Diagnosis present

## 2017-01-17 LAB — CBC
HEMATOCRIT: 32.7 % — AB (ref 36.0–46.0)
Hemoglobin: 9 g/dL — ABNORMAL LOW (ref 12.0–15.0)
MCH: 22.9 pg — ABNORMAL LOW (ref 26.0–34.0)
MCHC: 27.5 g/dL — ABNORMAL LOW (ref 30.0–36.0)
MCV: 83.2 fL (ref 78.0–100.0)
PLATELETS: 258 10*3/uL (ref 150–400)
RBC: 3.93 MIL/uL (ref 3.87–5.11)
RDW: 18.2 % — AB (ref 11.5–15.5)
WBC: 43.7 10*3/uL — AB (ref 4.0–10.5)

## 2017-01-17 LAB — COMPREHENSIVE METABOLIC PANEL
ALBUMIN: 3.3 g/dL — AB (ref 3.5–5.0)
ALK PHOS: 106 U/L (ref 38–126)
ALT: 24 U/L (ref 14–54)
AST: 51 U/L — AB (ref 15–41)
BUN: 49 mg/dL — AB (ref 6–20)
CO2: 7 mmol/L — ABNORMAL LOW (ref 22–32)
CREATININE: 2.98 mg/dL — AB (ref 0.44–1.00)
Calcium: 9.6 mg/dL (ref 8.9–10.3)
Chloride: 91 mmol/L — ABNORMAL LOW (ref 101–111)
GFR calc Af Amer: 16 mL/min — ABNORMAL LOW (ref >60)
GFR calc non Af Amer: 14 mL/min — ABNORMAL LOW (ref >60)
GLUCOSE: 87 mg/dL (ref 65–99)
POTASSIUM: 5.3 mmol/L — AB (ref 3.5–5.1)
SODIUM: 128 mmol/L — AB (ref 135–145)
TOTAL PROTEIN: 7.6 g/dL (ref 6.5–8.1)
Total Bilirubin: 1.9 mg/dL — ABNORMAL HIGH (ref 0.3–1.2)

## 2017-01-17 LAB — CBC WITH DIFFERENTIAL/PLATELET
BASOS ABS: 0 10*3/uL (ref 0.0–0.1)
BASOS PCT: 0 %
Eosinophils Absolute: 0 10*3/uL (ref 0.0–0.7)
Eosinophils Relative: 0 %
HEMATOCRIT: 28.5 % — AB (ref 36.0–46.0)
HEMOGLOBIN: 7.5 g/dL — AB (ref 12.0–15.0)
LYMPHS PCT: 2 %
Lymphs Abs: 0.7 10*3/uL (ref 0.7–4.0)
MCH: 21.1 pg — ABNORMAL LOW (ref 26.0–34.0)
MCHC: 26.3 g/dL — ABNORMAL LOW (ref 30.0–36.0)
MCV: 80.3 fL (ref 78.0–100.0)
Monocytes Absolute: 4.1 10*3/uL — ABNORMAL HIGH (ref 0.1–1.0)
Monocytes Relative: 14 %
NEUTROS ABS: 23.6 10*3/uL — AB (ref 1.7–7.7)
NEUTROS PCT: 83 %
Platelets: 322 10*3/uL (ref 150–400)
RBC: 3.55 MIL/uL — AB (ref 3.87–5.11)
RDW: 18.3 % — ABNORMAL HIGH (ref 11.5–15.5)
WBC: 28.4 10*3/uL — ABNORMAL HIGH (ref 4.0–10.5)

## 2017-01-17 LAB — POC OCCULT BLOOD, ED: Fecal Occult Bld: POSITIVE — AB

## 2017-01-17 LAB — PROTIME-INR
INR: 6.42
INR: 2.02
INR: 2.37
PROTHROMBIN TIME: 25.7 s — AB (ref 11.4–15.2)
Prothrombin Time: 56 seconds — ABNORMAL HIGH (ref 11.4–15.2)
Prothrombin Time: 22.7 seconds — ABNORMAL HIGH (ref 11.4–15.2)

## 2017-01-17 LAB — GLUCOSE, CAPILLARY: GLUCOSE-CAPILLARY: 154 mg/dL — AB (ref 65–99)

## 2017-01-17 LAB — PREPARE RBC (CROSSMATCH)

## 2017-01-17 LAB — LACTIC ACID, PLASMA: Lactic Acid, Venous: 18 mmol/L (ref 0.5–1.9)

## 2017-01-17 LAB — PROCALCITONIN: PROCALCITONIN: 0.26 ng/mL

## 2017-01-17 MED ORDER — SODIUM CHLORIDE 0.9 % IV BOLUS (SEPSIS)
500.0000 mL | Freq: Once | INTRAVENOUS | Status: AC
  Administered 2017-01-17: 500 mL via INTRAVENOUS

## 2017-01-17 MED ORDER — SODIUM CHLORIDE 0.9 % IV BOLUS (SEPSIS): 250.0000 mL | Freq: Once | INTRAVENOUS | Status: AC

## 2017-01-17 MED ORDER — SODIUM CHLORIDE 0.9 % IV SOLN
2.0000 mg/h | INTRAVENOUS | Status: DC
  Administered 2017-01-17: 2 mg/h via INTRAVENOUS
  Administered 2017-01-18: 1 mg/h via INTRAVENOUS
  Filled 2017-01-17 (×2): qty 10

## 2017-01-17 MED ORDER — MORPHINE SULFATE 25 MG/ML IV SOLN
INTRAVENOUS | Status: AC
  Filled 2017-01-17: qty 10

## 2017-01-17 MED ORDER — ORAL CARE MOUTH RINSE: 15.0000 mL | Freq: Two times a day (BID) | OROMUCOSAL | Status: DC

## 2017-01-17 MED ORDER — ONDANSETRON HCL 4 MG/2ML IJ SOLN
4.0000 mg | Freq: Once | INTRAMUSCULAR | Status: AC
  Administered 2017-01-17: 4 mg via INTRAVENOUS
  Filled 2017-01-17: qty 2

## 2017-01-17 MED ORDER — VANCOMYCIN HCL IN DEXTROSE 1-5 GM/200ML-% IV SOLN: 1000.0000 mg | Freq: Once | INTRAVENOUS | Status: DC

## 2017-01-17 MED ORDER — MORPHINE SULFATE (PF) 2 MG/ML IV SOLN
2.0000 mg | Freq: Once | INTRAVENOUS | Status: AC
  Administered 2017-01-17: 2 mg via INTRAVENOUS
  Filled 2017-01-17: qty 1

## 2017-01-17 MED ORDER — MORPHINE SULFATE (PF) 2 MG/ML IV SOLN
2.0000 mg | INTRAVENOUS | Status: DC | PRN
  Administered 2017-01-17: 2 mg via INTRAVENOUS
  Filled 2017-01-17: qty 1

## 2017-01-17 MED ORDER — PIPERACILLIN SOD-TAZOBACTAM SO 2.25 (2-0.25) G IV SOLR
2.2500 g | Freq: Three times a day (TID) | INTRAVENOUS | Status: DC
  Administered 2017-01-17 – 2017-01-18 (×2): 2.25 g via INTRAVENOUS
  Filled 2017-01-17 (×12): qty 2.25

## 2017-01-17 MED ORDER — ACETAMINOPHEN 325 MG PO TABS
650.0000 mg | ORAL_TABLET | Freq: Once | ORAL | Status: AC
  Administered 2017-01-17: 650 mg via ORAL
  Filled 2017-01-17: qty 2

## 2017-01-17 MED ORDER — ACETAMINOPHEN 325 MG PO TABS: 650.0000 mg | ORAL_TABLET | Freq: Four times a day (QID) | ORAL | Status: DC | PRN

## 2017-01-17 MED ORDER — EMPTY CONTAINERS FLEXIBLE MISC
3877.0000 [IU] | Status: AC
  Administered 2017-01-17: 3877 [IU] via INTRAVENOUS
  Filled 2017-01-17: qty 155.08

## 2017-01-17 MED ORDER — PANTOPRAZOLE SODIUM 40 MG IV SOLR
40.0000 mg | Freq: Two times a day (BID) | INTRAVENOUS | Status: DC
  Administered 2017-01-17 – 2017-01-18 (×2): 40 mg via INTRAVENOUS
  Filled 2017-01-17 (×2): qty 40

## 2017-01-17 MED ORDER — ACETAMINOPHEN 650 MG RE SUPP: 650.0000 mg | Freq: Four times a day (QID) | RECTAL | Status: DC | PRN

## 2017-01-17 MED ORDER — ONDANSETRON HCL 4 MG/2ML IJ SOLN: 4.0000 mg | Freq: Four times a day (QID) | INTRAMUSCULAR | Status: DC | PRN

## 2017-01-17 MED ORDER — PIPERACILLIN-TAZOBACTAM 3.375 G IVPB 30 MIN
3.3750 g | Freq: Once | INTRAVENOUS | Status: DC
  Filled 2017-01-17: qty 50

## 2017-01-17 MED ORDER — SODIUM CHLORIDE 0.9 % IV BOLUS (SEPSIS): 1000.0000 mL | Freq: Once | INTRAVENOUS | Status: AC

## 2017-01-17 MED ORDER — SODIUM CHLORIDE 0.9 % IV SOLN
10.0000 mL/h | Freq: Once | INTRAVENOUS | Status: AC
  Administered 2017-01-17: 10 mL/h via INTRAVENOUS

## 2017-01-17 MED ORDER — PIPERACILLIN-TAZOBACTAM IN DEX 2-0.25 GM/50ML IV SOLN
2.2500 g | Freq: Three times a day (TID) | INTRAVENOUS | Status: DC
  Filled 2017-01-17 (×3): qty 50

## 2017-01-17 MED ORDER — SODIUM BICARBONATE 8.4 % IV SOLN
INTRAVENOUS | Status: DC
  Administered 2017-01-17 – 2017-01-18 (×3): via INTRAVENOUS
  Filled 2017-01-17 (×6): qty 1000

## 2017-01-17 MED ORDER — SODIUM BICARBONATE 8.4 % IV SOLN
50.0000 meq | Freq: Once | INTRAVENOUS | Status: AC
  Administered 2017-01-17: 50 meq via INTRAVENOUS
  Filled 2017-01-17: qty 50

## 2017-01-17 MED ORDER — CHLORHEXIDINE GLUCONATE 0.12 % MT SOLN
15.0000 mL | Freq: Two times a day (BID) | OROMUCOSAL | Status: DC
  Administered 2017-01-17 – 2017-01-18 (×2): 15 mL via OROMUCOSAL
  Filled 2017-01-17 (×2): qty 15

## 2017-01-17 MED ORDER — VANCOMYCIN HCL IN DEXTROSE 1-5 GM/200ML-% IV SOLN: 1000.0000 mg | INTRAVENOUS | Status: DC

## 2017-01-17 MED ORDER — VITAMIN K1 10 MG/ML IJ SOLN
10.0000 mg | INTRAVENOUS | Status: AC
  Administered 2017-01-17: 10 mg via INTRAVENOUS
  Filled 2017-01-17: qty 1

## 2017-01-17 MED ORDER — ONDANSETRON HCL 4 MG PO TABS: 4.0000 mg | ORAL_TABLET | Freq: Four times a day (QID) | ORAL | Status: DC | PRN

## 2017-01-17 NOTE — H&P (Signed)
History and Physical    [name] DDU:[id number] DOB: 1932-09-14 DOA: 01/26/2017  PCP: [name], MD  Patient coming from: home  I have personally briefly reviewed patient's old medical records in [place]  Chief Complaint: generalized weakness  HPI: [name] is a 83 y.o. female with medical history significant of chronic atrial fibrillation on anticoagulation, chronic diastolic congestive heart failure, chronic GI bleeding from angiodysplasia of stomach and duodenum who lives alone. Patient's family reports that over the past 2-3 days, she's complained of worsening generalized weakness and dizziness. By mouth intake has been normal for her. She's not had any fevers. It is unclear whether she is having any dark colored stools, although family is aware that yesterday the patient had noted blood after wiping. She's not had any shortness of breath or cough. No vomiting. She felt unwell yesterday but refused to come in the hospital. This morning, she was brought to the hospital for further evaluation.  ED Course: Patient was found to be hypotensive and hypothermic. Creatinine was elevated from baseline at 2.98. Hemoglobin was7.5, but likely falsely elevated due to hemoconcentration. Chest x-ray did not show any acute findings. Serum bicarbonate noted to be low at 7. WBC count significantly elevated at 20,000. INR elevated at 6. Stools were noted to be brown by ER physician and strongly Hemoccult positive  Review of Systems: limited due to mental status   Past Medical History:  Diagnosis Date  . Angiodysplasia of stomach and duodenum   . Carotid artery disease ([place])    RIght CEA 05/2004  . Chronic anticoagulation   . Chronic atrial fibrillation ([place])   . Chronic blood loss anemia   . Chronic diastolic CHF (congestive heart failure) ([place])   . Coronary atherosclerosis of native coronary artery    a. RCA stent 2003 with 60% LCx, 60% OM1, 80% OM2 disease at that time.   . Diverticulosis   . Essential hypertension   . Gastric ulcer   . GI bleeding    Recurrent GIB (prior gastric ulcer/diverticulosis/AVM in 2017, gastric AVM s/p endoscopic therapy in 05/2016, recurrent GIB 06/2016 with AVM s/p therapy on endoscopy.  . Hyperlipidemia   . Lymphedema   . Obesity   . Renal artery stenosis (H[facility])    Bilateral renal artery stents 2004  . Tobacco abuse, in remission    40 pack year stopped in 1989    Past Surgical History:  Procedure Laterality Date  . APPENDECTOMY    . CAROTID ENDARTERECTOMY     Right in 2005, left in 2010  . CATARACT EXTRACTION W/PHACO Right 01/04/2017   Procedure: CATARACT EXTRACTION PHACO AND INTRAOCULAR LENS PLACEMENT (IOC);  Surgeon: Baruch Goldmann, MD;  Location: [facility];  Service: Ophthalmology;  Laterality: Right;  CDE: 30.25  . COLONOSCOPY N/A 08/19/2015   Procedure: COLONOSCOPY;  Surgeon: Rogene Houston, MD;  Location: [facility];  Service: Endoscopy;  Laterality: N/A;  7:30  . CORONARY ANGIOPLASTY    . ESOPHAGOGASTRODUODENOSCOPY N/A 08/19/2015   Procedure: ESOPHAGOGASTRODUODENOSCOPY (EGD);  Surgeon: Rogene Houston, MD;  Location: [facility];  Service: Endoscopy;  Laterality: N/A;  . ESOPHAGOGASTRODUODENOSCOPY N/A 05/24/2016   Procedure: ESOPHAGOGASTRODUODENOSCOPY (EGD);  Surgeon: Rogene Houston, MD;  Location[facility];  Service: Endoscopy;  Laterality: N/A;  . ESOPHAGOGASTRODUODENOSCOPY N/A 06/19/2016   Procedure: ESOPHAGOGASTRODUODENOSCOPY (EGD);  Surgeon: Rogene Houston, MD;  [facility];  Service: Endoscopy;  Laterality: N/A;  . ESOPHAGOGASTRODUODENOSCOPY N/A 09/05/2016   Procedure: ESOPHAGOGASTRODUODENOSCOPY (  EGD);  Surgeon: Rogene Houston, MD;  [facility];  Service: Endoscopy;  Laterality: N/A;  . GIVENS CAPSULE STUDY N/A 06/20/2016   Procedure: GIVENS CAPSULE STUDY;  Surgeon: Rogene Houston, MD;  Location: [facility];  Service: Endoscopy;  Laterality: N/A;     reports that she quit  smoking about 29 years ago. Her smoking use included Cigarettes. She has never used smokeless tobacco. She reports that she does not drink alcohol or use drugs.  Allergies  Allergen Reactions  . Diltiazem Other (See Comments)    Edema    Family History  Problem Relation Age of Onset  . Lung cancer Mother   . Cancer Mother   . Hypertension Daughter   . Hyperlipidemia Daughter   . Hypertension Son   . Hypertension Daughter   . Heart disease Maternal Grandmother     Prior to Admission medications   Medication Sig Start Date End Date Taking? Authorizing Provider  acetaminophen (TYLENOL) 500 MG tablet Take 500 mg by mouth every 6 (six) hours as needed for headache.   Yes [provider]  atorvastatin (LIPITOR) 80 MG tablet TAKE 1 TABLET EVERY NIGHT AT BEDTIME 05/07/16  Yes Satira Sark, MD  bisoprolol (ZEBETA) 5 MG tablet take 1 tablet by mouth twice a day 01/09/17  Yes Satira Sark, MD  metolazone (ZAROXOLYN) 2.5 MG tablet Take 2.5 mg daily as directed. Patient taking differently: Take 2.5 mg by mouth once a week. Take 2.5 mg daily as directed. 12/26/16  Yes Satira Sark, MD  Multiple Vitamin (MULTIVITAMIN) capsule Take 1 capsule by mouth daily.   Yes [provider]  pantoprazole (PROTONIX) 40 MG tablet Take 1 tablet (40 mg total) by mouth 2 (two) times daily before a meal. 10/12/16  Yes Setzer, Terri L, NP  potassium chloride SA (K-DUR,KLOR-CON) 20 MEQ tablet Take 40 meq (2 tablets) in the am , and take 20 meq (1 tablet) in the pm Patient taking differently: Take 20-40 mEq by mouth See admin instructions. Take 40 meq (2 tablets) in the am , and take 20 meq (1 tablet) in the pm, except on Wednesday take 40 meq twice daily. 11/01/16  Yes Satira Sark, MD  torsemide (DEMADEX) 20 MG tablet Take 4 tablets (80 mg total) by mouth 2 (two) times daily. 10/24/16  Yes Satira Sark, MD  verapamil (CALAN) 80 MG tablet Take 1 tablet (80 mg total) by mouth 2  (two) times daily. 01/14/17  Yes Satira Sark, MD  warfarin (COUMADIN) 5 MG tablet Take 5 mg by mouth daily at 6 PM. Take 5 mg daily except 2.5 mg on Tuesday and Saturday.   Yes [provider]    Physical Exam: Vitals:   01/24/2017 1207 01/18/2017 1218 01/25/2017 1219 01/16/2017 1236  BP: (!) 81/28 (!) 84/71 (!) 84/71 (!) 82/22  Pulse: (!) 46  (!) 44 (!) 46  Resp: (!) 30 (!) 28 (!) 24 (!) 25  Temp:   (!) 91.4 F (33 C) (!) 91.3 F (32.9 C)  TempSrc:   Rectal Rectal  SpO2: 100%   91%  Weight:      Height:        Constitutional: NAD, calm, comfortable Vitals:   02/07/2017 1207 01/20/2017 1218 01/21/2017 1219 02/05/2017 1236  BP: (!) 81/28 (!) 84/71 (!) 84/71 (!) 82/22  Pulse: (!) 46  (!) 44 (!) 46  Resp: (!) 30 (!) 28 (!) 24 (!) 25  Temp:   Marland Kitchen)  91.4 F (33 C) (!) 91.3 F (32.9 C)  TempSrc:   Rectal Rectal  SpO2: 100%   91%  Weight:      Height:       Eyes: PERRL, lids and conjunctivae normal ENMT: Mucous membranes are dry. Posterior pharynx clear of any exudate or lesions.Normal dentition.  Neck: normal, supple, no masses, no thyromegaly Respiratory: clear to auscultation bilaterally, no wheezing, no crackles. increased respiratory effort. No accessory muscle use.  Cardiovascular: irregular rate and rhythm, no murmurs / rubs / gallops. Trace extremity edema. 2+ pedal pulses. No carotid bruits.  Abdomen: no tenderness, no masses palpated. No hepatosplenomegaly. Bowel sounds positive.  Musculoskeletal: no clubbing / cyanosis. No joint deformity upper and lower extremities. Good ROM, no contractures. Normal muscle tone.  Skin: no rashes, lesions, ulcers. No induration Neurologic: limited exam due to patient's lethargy. No gross deficits  Psychiatric: lethargic   Labs on Admission: I have personally reviewed following labs and imaging studies  CBC:  Recent Labs Lab 01/30/2017 0909  WBC 28.4*  NEUTROABS 23.6*  HGB 7.5*  HCT 28.5*  MCV 80.3  PLT 956   Basic Metabolic  Panel:  Recent Labs Lab 01/31/2017 0909  NA 128*  K 5.3*  CL 91*  CO2 7*  GLUCOSE 87  BUN 49*  CREATININE 2.98*  CALCIUM 9.6   GFR: Estimated Creatinine Clearance: 13.9 mL/min (A) (by C-G formula based on SCr of 2.98 mg/dL (H)). Liver Function Tests:  Recent Labs Lab 01/14/2017 0909  AST 51*  ALT 24  ALKPHOS 106  BILITOT 1.9*  PROT 7.6  ALBUMIN 3.3*   No results for input(s): LIPASE, AMYLASE in the last 168 hours. No results for input(s): AMMONIA in the last 168 hours. Coagulation Profile:  Recent Labs Lab 01/14/2017 0909  INR 6.42*   Cardiac Enzymes: No results for input(s): CKTOTAL, CKMB, CKMBINDEX, TROPONINI in the last 168 hours. BNP (last 3 results) No results for input(s): PROBNP in the last 8760 hours. HbA1C: No results for input(s): HGBA1C in the last 72 hours. CBG: No results for input(s): GLUCAP in the last 168 hours. Lipid Profile: No results for input(s): CHOL, HDL, LDLCALC, TRIG, CHOLHDL, LDLDIRECT in the last 72 hours. Thyroid Function Tests: No results for input(s): TSH, T4TOTAL, FREET4, T3FREE, THYROIDAB in the last 72 hours. Anemia Panel: No results for input(s): VITAMINB12, FOLATE, FERRITIN, TIBC, IRON, RETICCTPCT in the last 72 hours. Urine analysis:    Component Value Date/Time   COLORURINE YELLOW 12/18/2016 1700   APPEARANCEUR CLEAR 12/18/2016 1700   LABSPEC 1.008 12/18/2016 1700   PHURINE 7.0 12/18/2016 1700   GLUCOSEU NEGATIVE 12/18/2016 1700   HGBUR NEGATIVE 12/18/2016 1700   BILIRUBINUR NEGATIVE 12/18/2016 1700   KETONESUR NEGATIVE 12/18/2016 1700   PROTEINUR NEGATIVE 12/18/2016 1700   UROBILINOGEN 0.2 08/31/2009 1628   NITRITE NEGATIVE 12/18/2016 1700   LEUKOCYTESUR NEGATIVE 12/18/2016 1700    Radiological Exams on Admission: Dg Chest Port 1 View  Result Date: 02/09/2017 CLINICAL DATA:  Leukocytosis EXAM: PORTABLE CHEST 1 VIEW COMPARISON:  12/18/2016 FINDINGS: Cardiac shadow is mildly enlarged but stable. Aortic calcifications  are again seen and stable. The lungs are well aerated bilaterally. No bony abnormality is seen. IMPRESSION: No acute abnormality noted. Electronically Signed   By: Inez Catalina M.D.   On: 01/23/2017 11:53    Assessment/Plan Active Problems:   Chronic atrial fibrillation (HCC)   Chronic anticoagulation   Symptomatic anemia   Acute blood loss anemia   CKD (chronic kidney disease) stage  3, GFR 30-59 ml/min (HCC)   GI bleed   Hypotension   AKI (acute kidney injury) ([place])   Dehydration   Metabolic acidosis   Coumadin toxicity   Hyponatremia    1. Hypotension. Patient meets criteria for systemic inflammatory response syndrome. Cannot rule out sepsis at this time. She'll be continued on IV hydration. Her hypotension may be related to simple volume depletion from dehydration/severe anemia. Will check lactic acid. Continue hydration. She will receive blood products. Infectious workup including blood culture and urinalysis has been ordered. Broad-spectrum antibiotics have been ordered for now with low threshold to discontinue. She is also hypothermic, and warming blanket has been applied. 2. Acute blood loss anemia. Baseline hemoglobin appears to be around 9. She presents with a hemoglobin of 7.5, likely falsely elevated due to hemoconcentration. She is hypotensive. 2 units PRBCs have been ordered. Continue to follow 3. GI bleeding. Stools are brown but Hemoccult positive. She does not have any evidence of ongoing active bleeding at this time. Gastroenterology has been consulted. She'll need to be stabilized before considering any procedures. 4. Acute kidney injury on chronic kidney disease stage III. Creatinine elevated at 2.9. Likely related to hypotension/volume depletion. Will continue with hydration and recheck labs in a.m. If no improvement, can consider further workup. 5. Hyponatremia. Likely due to volume depletion. Continue IV fluids. 6. Metabolic acidosis. Suspect this is related to  dehydration and renal failure. She's been started on a bicarbonate infusion. I suspect this is also driving her increased respiratory effort since her chest x-ray does not show any acute findings. 7. Chronic atrial fibrillation. Hold rate control medications today ongoing issues with hypotension. Anticoagulation also on hold. CHADSVASc 5, although with her ongoing issues with bleeding, it would likely be safer to discontinue anticoagulation moving forward. 8. Coagulopathy related to Coumadin. She is received K Centra and vitamin K in the emergency room. Follow-up labs.  DVT prophylaxis: scd Code Status: DNR Family Communication: discussed with children at the bedside. They're agreeable to continue with current treatments, but if patient does not have any significant improvement/has further deterioration, they would want to transition her care towards comfort measures. Disposition Plan: pending hospital course Consults called: gastroenterology Admission status: inpatient/ stepdown   Orlander Norwood MD [facility] Pager [phone]  If 7PM-7AM, please contact night-coverage [email or web address] Password [email or web address]  01/15/2017, 1:12 PM

## 2017-01-17 NOTE — Telephone Encounter (Signed)
Dr.Rehman was made aware. 

## 2017-01-17 NOTE — Patient Outreach (Signed)
[facility]) Care Management  01/16/2017  MARLANE HIRSCHMANN 21-May-1932 [id number]   Care coordination   [facility] CM saw and spoke with Peggy Arlin daughters after noting her inpatient admission [name] reports calling Peggy Sura this am and noted confusion She picked her up and brought Peggy Milani to the ED  After evaluation she is being admitted for Hypotension, meeting criteria for systemic inflammatory response syndrome. Cannot rule out sepsis- Acute blood loss anemia. Baseline hemoglobin appears to be around 9. She presents with a hemoglobin of 7.5 GI bleeding. Stools are brown but Hemoccult positive  Plans continue to follow [name] for discharge  [place]. Lavina Hamman, RN, BSN, [place] Care Management [phone]

## 2017-01-17 NOTE — Telephone Encounter (Signed)
Daughter called to let us know her mom is pulling into [facility] now and is bleeding again.  Just wanted him to know as FYI--last time they did not tell him until she was IP second day.

## 2017-01-17 NOTE — Progress Notes (Signed)
Pharmacy Antibiotic Note  [name] is a 83 y.o. female admitted on 01/29/2017 with sepsis.  Pharmacy has been consulted for Vancomycin and Zosyn dosing.  Initial doses given in ED.  Plan: Vancomycin 1gm IV every 48 hours.  Goal trough 15-20 mcg/mL. Zosyn 2.25gm IV every 8 hours. Follow-up micro data, labs, vitals.    Height: 5\' 3"  (160 cm) Weight: 161 lb (73 kg) IBW/kg (Calculated) : 52.4  Temp (24hrs), Avg:93.8 F (34.3 C), Min:91.3 F (32.9 C), Max:98.6 F (37 C)   Recent Labs Lab 01/18/2017 0909  WBC 28.4*  CREATININE 2.98*    Estimated Creatinine Clearance: 13.9 mL/min (A) (by C-G formula based on SCr of 2.98 mg/dL (H)).    Allergies  Allergen Reactions  . Diltiazem Other (See Comments)    Edema   Antimicrobials this admission: Vanc 10/4 >>  Zosyn 10/4 >>   Dose adjustments this admission: n/a   Microbiology results: 10/4 BCx: pending 10/4 UCx: pending   Sputum:    MRSA PCR:   Thank you for allowing pharmacy to be a part of this patient's care.  Pricilla Larsson 01/26/2017 2:30 PM

## 2017-01-17 NOTE — Consult Note (Signed)
Referring Provider: Kathie Dike, MD Primary Care Physician:  [name], MD Primary Gastroenterologist:  Dr. Laural Golden  Reason for Consultation:    GI bleed and anemia.  HPI:   Patient is 83 year old Caucasian female with multiple medical problems including chronic atrial fibrillation on anticoagulant who was briefly hospitalized in March 2017 for profound anemia. She received 3 units of PRBCs. She was concerned to have iron deficiency anemia. She had EGD and colonoscopy in May 2017. She was found to have Chronic gastritis along with gastric ulcer without stigmata of bleeding. Biopsy revealed benign etiology and H. pylori gastritis. She was treated for H. pylori gastritis at a later date. On colonoscopy she was found to have 2 small polyps which were cold snared. She had 3 small nonbleeding colonic AV malformations and sigmoid colon diverticulosis. She was hospitalized again in February 2018 and found to be bleeding from gastric AV malformation treated endoscopically. Previously noted gastric ulcer had completely healed. She was readmitted in March 2018 and found to have 5 nonbleeding gastric AV malformations which were all ablated with APC. She also had given capsule study revealing small bowel angiodysplasia without bleeding. She had 4 more gastric AV malformations ablated on EGD of 09/05/2016. She has required periodic blood transfusion. She had H&H 4 weeks ago and hemoglobin was 9.3.  She was doing fine until 2 or 3 days ago when she felt weak. She did not experience melena or rectal bleeding. According to her daughter she complained of abdominal pain 2 days ago. Yesterday she noted gross hematuria and felt she had vaginal and rectal bleeding. She felt weak and nauseated. She was brought to the emergency room this morning. She was noted to have hemoglobin of 7.5 g and INR was 6. She was noted to be hypotensive and hypothermic. WBC was 28.4 and platelet count 322K. Serum creatinine was 2.98.  It was 1.271 week ago. She is also noted to be hyponatremic with sodium of 128 and CO2 was 7. Her bilirubin was 1.9 with AST of 51 and ALT of 24. Patient was recess is dated with IV fluids and given 1 unit of PRBCs. In emergency room she received  KCentra and vitamin K. She was admitted to ICU. There was concern for sepsis/systemic inflammatory response syndrome. Patient has been cultured and she was begun on vancomycin and Zosyn. Patient is receiving second unit of PRBCs. In emergency room she was noted to have brown but heme positive stool. She is covered with warming blanket. She denies chest pain or shortness of breath. She complains of abdominal pain primarily on left side of her abdomen. According to her daughters she has not complained of dysuria fever or chills. She has had good appetite and has been drinking plenty of fluids. She felt well enough to go to church this past weekend. Her daughters have noted memory impairment recently. According to nursing staff and patient was brought to ICU she had transient myoclonic activity.    Past Medical History:  Diagnosis Date  . Angiodysplasia of stomach and duodenum   . Carotid artery disease ([place])    RIght CEA 05/2004  . Chronic anticoagulation   . Chronic atrial fibrillation ([place])   . Chronic blood loss anemia   . Chronic diastolic CHF (congestive heart failure) ([place])   . Coronary atherosclerosis of native coronary artery    a. RCA stent 2003 with 60% LCx, 60% OM1, 80% OM2 disease at that time.  . Diverticulosis   . Essential hypertension   .  Gastric ulcer   . GI bleeding    Recurrent GIB (prior gastric ulcer/diverticulosis/AVM in 2017, gastric AVM s/p endoscopic therapy in 05/2016, recurrent GIB 06/2016 with AVM s/p therapy on endoscopy.  . Hyperlipidemia   . Lymphedema   . Obesity   . Renal artery stenosis ([facility])    Bilateral renal artery stents 2004  . Tobacco abuse, in remission    40 pack year stopped in 1989    Past Surgical  History:  Procedure Laterality Date  . APPENDECTOMY    . CAROTID ENDARTERECTOMY     Right in 2005, left in 2010  . CATARACT EXTRACTION W/PHACO Right 01/04/2017   Procedure: CATARACT EXTRACTION PHACO AND INTRAOCULAR LENS PLACEMENT (IOC);  Surgeon: Baruch Goldmann, MD;  Location: [facility];  Service: Ophthalmology;  Laterality: Right;  CDE: 30.25  . COLONOSCOPY N/A 08/19/2015   Procedure: COLONOSCOPY;  Surgeon: Rogene Houston, MD;  Location: [facility];  Service: Endoscopy;  Laterality: N/A;  7:30  . CORONARY ANGIOPLASTY    . ESOPHAGOGASTRODUODENOSCOPY N/A 08/19/2015   Procedure: ESOPHAGOGASTRODUODENOSCOPY (EGD);  Surgeon: Rogene Houston, MD;  Location: [facility];  Service: Endoscopy;  Laterality: N/A;  . ESOPHAGOGASTRODUODENOSCOPY N/A 05/24/2016   Procedure: ESOPHAGOGASTRODUODENOSCOPY (EGD);  Surgeon: Rogene Houston, MD;  Location: [facility];  Service: Endoscopy;  Laterality: N/A;  . ESOPHAGOGASTRODUODENOSCOPY N/A 06/19/2016   Procedure: ESOPHAGOGASTRODUODENOSCOPY (EGD);  Surgeon: Rogene Houston, MD;  [facility];  Service: Endoscopy;  Laterality: N/A;  . ESOPHAGOGASTRODUODENOSCOPY N/A 09/05/2016   Procedure: ESOPHAGOGASTRODUODENOSCOPY (EGD);  Surgeon: Rogene Houston, MD;  [facility]: [facility];  Service: Endoscopy;  Laterality: N/A;  . GIVENS CAPSULE STUDY N/A 06/20/2016   Procedure: GIVENS CAPSULE STUDY;  Surgeon: Rogene Houston, MD;  Location: [facility];  Service: Endoscopy;  Laterality: N/A;    Prior to Admission medications   Medication Sig Start Date End Date Taking? Authorizing Provider  acetaminophen (TYLENOL) 500 MG tablet Take 500 mg by mouth every 6 (six) hours as needed for headache.   Yes [provider]  atorvastatin (LIPITOR) 80 MG tablet TAKE 1 TABLET EVERY NIGHT AT BEDTIME 05/07/16  Yes Satira Sark, MD  bisoprolol (ZEBETA) 5 MG tablet take 1 tablet by mouth twice a day 01/09/17  Yes Satira Sark, MD  metolazone (ZAROXOLYN) 2.5 MG  tablet Take 2.5 mg daily as directed. Patient taking differently: Take 2.5 mg by mouth once a week. Take 2.5 mg daily as directed. 12/26/16  Yes Satira Sark, MD  Multiple Vitamin (MULTIVITAMIN) capsule Take 1 capsule by mouth daily.   Yes [provider]  pantoprazole (PROTONIX) 40 MG tablet Take 1 tablet (40 mg total) by mouth 2 (two) times daily before a meal. 10/12/16  Yes Setzer, Terri L, NP  potassium chloride SA (K-DUR,KLOR-CON) 20 MEQ tablet Take 40 meq (2 tablets) in the am , and take 20 meq (1 tablet) in the pm Patient taking differently: Take 20-40 mEq by mouth See admin instructions. Take 40 meq (2 tablets) in the am , and take 20 meq (1 tablet) in the pm, except on Wednesday take 40 meq twice daily. 11/01/16  Yes Satira Sark, MD  torsemide (DEMADEX) 20 MG tablet Take 4 tablets (80 mg total) by mouth 2 (two) times daily. 10/24/16  Yes Satira Sark, MD  verapamil (CALAN) 80 MG tablet Take 1 tablet (80 mg total) by mouth 2 (two) times daily. 01/14/17  Yes Satira Sark, MD  warfarin (  COUMADIN) 5 MG tablet Take 5 mg by mouth daily at 6 PM. Take 5 mg daily except 2.5 mg on Tuesday and Saturday.   Yes [provider]    Current Facility-Administered Medications  Medication Dose Route Frequency Provider Last Rate Last Dose  . acetaminophen (TYLENOL) tablet 650 mg  650 mg Oral Q6H PRN Kathie Dike, MD       Or  . acetaminophen (TYLENOL) suppository 650 mg  650 mg Rectal Q6H PRN Memon, Jehanzeb, MD      . dextrose 5 % 1,000 mL with sodium bicarbonate 150 mEq infusion   Intravenous Continuous Kathie Dike, MD 125 mL/hr at 01/14/2017 1234    . ondansetron (ZOFRAN) tablet 4 mg  4 mg Oral Q6H PRN Kathie Dike, MD       Or  . ondansetron (ZOFRAN) injection 4 mg  4 mg Intravenous Q6H PRN Kathie Dike, MD      . pantoprazole (PROTONIX) injection 40 mg  40 mg Intravenous Q12H Memon, Jolaine Artist, MD      . piperacillin-tazobactam (ZOSYN) 2.25 g in dextrose  5 % 50 mL IVPB  2.25 g Intravenous Q8H Memon, Jehanzeb, MD      . piperacillin-tazobactam (ZOSYN) IVPB 3.375 g  3.375 g Intravenous Once Kathie Dike, MD      . sodium chloride 0.9 % bolus 1,000 mL  1,000 mL Intravenous Once Kathie Dike, MD       And  . sodium chloride 0.9 % bolus 1,000 mL  1,000 mL Intravenous Once Kathie Dike, MD       And  . sodium chloride 0.9 % bolus 250 mL  250 mL Intravenous Once Kathie Dike, MD      . vancomycin (VANCOCIN) IVPB 1000 mg/200 mL premix  1,000 mg Intravenous Once Kathie Dike, MD      . Derrill Memo ON 01-23-2017] vancomycin (VANCOCIN) IVPB 1000 mg/200 mL premix  1,000 mg Intravenous Q48H Kathie Dike, MD        Allergies as of 01/22/2017 - Review Complete 02/09/2017  Allergen Reaction Noted  . Diltiazem Other (See Comments) 01/03/2012    Family History  Problem Relation Age of Onset  . Lung cancer Mother   . Cancer Mother   . Hypertension Daughter   . Hyperlipidemia Daughter   . Hypertension Son   . Hypertension Daughter   . Heart disease Maternal Grandmother     Social History   Social History  . Marital status: Widowed    Spouse name: N/A  . Number of children: 4  . Years of education: N/A   Occupational History  . housewife Retired   Social History Main Topics  . Smoking status: Former Smoker    Types: Cigarettes    Quit date: 04/30/1987  . Smokeless tobacco: Never Used  . Alcohol use No  . Drug use: No  . Sexual activity: Not on file   Other Topics Concern  . Not on file   Social History Narrative  . No narrative on file    Review of Systems: See HPI, otherwise normal ROS  Physical Exam: Temp:  [91.3 F (32.9 C)-98.6 F (37 C)] 93.2 F (34 C) (10/04 1528) Pulse Rate:  [44-82] 44 (10/04 1528) Resp:  [19-33] 29 (10/04 1528) BP: (65-117)/(18-71) 87/31 (10/04 1515) SpO2:  [88 %-100 %] 88 % (10/04 1528) Weight:  [161 lb (73 kg)] 161 lb (73 kg) (10/04 0856)   Patient appears pale. She is in no acute  distress. She responds appropriately to questions.  Conjunctiva is pale.. Sclerae nonicteric. No neck masses or thyromegaly noted. Cardiac exam with slow heart rate. No murmur or gallop noted. Auscultation of lungs reveals visit her breath sounds bilaterally. Abdomen is full. Bowel sounds are hypoactive. Abdomen is soft with tenderness noted in epigastric region left upper quadrant and left lower quadrant. A small to moderate. No tenderness noted in right upper or right low quadrant. No organomegaly or masses. He has at least 2+ pitting edema involving both lower extremities. She has prominent varicose veins involving left leg.    Lab Results:  Recent Labs  02/05/2017 0909  WBC 28.4*  HGB 7.5*  HCT 28.5*  PLT 322   BMET  Recent Labs  01/29/2017 0909  NA 128*  K 5.3*  CL 91*  CO2 7*  GLUCOSE 87  BUN 49*  CREATININE 2.98*  CALCIUM 9.6   LFT  Recent Labs  01/18/2017 0909  PROT 7.6  ALBUMIN 3.3*  AST 51*  ALT 24  ALKPHOS 106  BILITOT 1.9*   PT/INR  Recent Labs  02/05/2017 0909 01/20/2017 1302  LABPROT 56.0* 22.7*  INR 6.42* 2.02   Hepatitis Panel No results for input(s): HEPBSAG, HCVAB, HEPAIGM, HEPBIGM in the last 72 hours.  Studies/Results: Dg Chest Port 1 View  Result Date: 01/15/2017 CLINICAL DATA:  Leukocytosis EXAM: PORTABLE CHEST 1 VIEW COMPARISON:  12/18/2016 FINDINGS: Cardiac shadow is mildly enlarged but stable. Aortic calcifications are again seen and stable. The lungs are well aerated bilaterally. No bony abnormality is seen. IMPRESSION: No acute abnormality noted. Electronically Signed   By: Inez Catalina M.D.   On: 01/25/2017 11:53    Assessment;  Patient is 83 year old Caucasian female with multiple medical problems including chronic atrial fibrillation who is anticoagulated with supratherapeutic INR who presents with weakness and noted to have hemoglobin of 7.5 g. She noted gross hematuria and vaginal/rectal bleeding yesterday. On presentation she  was hypotensive and she has low CO2 suggestive of metabolic acidosis. She has leukocytosis. She is afebrile. Abdominal examination reveals hypoactive bowel sounds and left-sided abdominal tenderness. Patient has been cultured and begun on broad-spectrum antibiotics. Patient's current presentation is unlike prior presentations when she has presented with acute on chronic GI bleed and anemia. There must be a second diagnosis in addition to GI bleed to account for acute illness. While she could have sepsis I'm also worried that she may have intestinal ischemic injury which would explain her presentation including worsening renal function. She has received vitamin K and prothrombin complex which should reverse coagulopathy. Patient's condition discuss with her son and 2 daughters. If she has ischemic distal injury her prognosis is poor.   Recommendations;  Portable KUB. Agree with blood transfusion in order to keep her hemoglobin above 9 g. Will consider abdominopelvic CT if she improves.   LOS: 0 days   Teairra Millar  01/16/2017, 4:03 PM

## 2017-01-17 NOTE — ED Triage Notes (Signed)
Pt c/o vaginal or rectal bleeding that started yesterday along with dizziness and nausea. Pt reports she is unsure where it is coming from. Pt is on Coumadin. Pt is very weak and tired.

## 2017-01-17 NOTE — ED Provider Notes (Signed)
[facility] DEPT Provider Note   CSN: [id number] Arrival date & time: 01/27/2017  0844     History   Chief Complaint Chief Complaint  Patient presents with  . Rectal Bleeding    HPI [name] is a 83 y.o. female.  Is here for evaluation of "bleeding like she is having a period."  Patient told family members that she was bleeding, so they brought her here.  She has a history of gastrointestinal bleeding, has been cauterized, and is on warfarin.  The patient is elderly but lives alone, despite her family's recommendations to live with them.  The patient denies other symptoms.  She is a poor historian.  Family members state that the patient was referred to "[facility],", for further evaluation of her gastro-intestinal bleeding.  The patient chose not to go to that consultant.  Level 5 caveat-poor historian  HPI  Past Medical History:  Diagnosis Date  . Angiodysplasia of stomach and duodenum   . Carotid artery disease ([place])    RIght CEA 05/2004  . Chronic anticoagulation   . Chronic atrial fibrillation ([place])   . Chronic blood loss anemia   . Chronic diastolic CHF (congestive heart failure) ([place])   . Coronary atherosclerosis of native coronary artery    a. RCA stent 2003 with 60% LCx, 60% OM1, 80% OM2 disease at that time.  . Diverticulosis   . Essential hypertension   . Gastric ulcer   . GI bleeding    Recurrent GIB (prior gastric ulcer/diverticulosis/AVM in 2017, gastric AVM s/p endoscopic therapy in 05/2016, recurrent GIB 06/2016 with AVM s/p therapy on endoscopy.  . Hyperlipidemia   . Lymphedema   . Obesity   . Renal artery stenosis (HCC)    Bilateral renal artery stents 2004  . Tobacco abuse, in remission    40 pack year stopped in 1989    Patient Active Problem List   Diagnosis Date Noted  . Hypotension 02/10/2017  . AKI (acute kidney injury) ([place]) 02/04/2017  . Dehydration 01/24/2017  . Metabolic acidosis 29/47/6546  . Coumadin toxicity 01/18/2017  .  Hyponatremia 02/02/2017  . CHF, acute on chronic ([place]) 12/18/2016  . Urinary tract infection without hematuria   . GI bleed 12/08/2016  . Asymptomatic bacteriuria 12/08/2016  . Lymphedema 11/14/2016  . CKD (chronic kidney disease) stage 3, GFR 30-59 ml/min (HCC) 09/05/2016  . Hypokalemia 09/05/2016  . CHF exacerbation ([place]) 08/25/2016  . Difficulty in walking, not elsewhere classified   . Dizziness and giddiness   . Gastrointestinal hemorrhage associated with peptic ulcer   . Renal impairment   . Acute blood loss anemia 05/22/2016  . Chronic blood loss anemia 05/22/2016  . Acute on chronic diastolic CHF (congestive heart failure) ([place]) 05/22/2016  . Acute renal failure superimposed on stage 3 chronic kidney disease ([place]) 05/22/2016  . Anticoagulated on Coumadin   . Symptomatic anemia 07/11/2015  . Microcytic anemia 07/11/2015  . Fatigue 07/11/2015  . Occlusion and stenosis of carotid artery without mention of cerebral infarction-Bilateral 11/11/2013  . Aftercare following surgery of the circulatory system, [place] 11/11/2013  . Varicose veins of bilateral lower extremities with other complications 50/35/4656  . Encounter for therapeutic drug monitoring 05/18/2013  . Fasting hyperglycemia 07/03/2012  . Chronic anticoagulation   . Tobacco abuse, in remission   . Carotid artery occlusion   . Hypertension   . Obesity   . Hyperlipidemia   . Chronic venous insufficiency 07/26/2010  . Chronic atrial fibrillation ([place]) 04/05/2010  . Coronary  atherosclerosis of native coronary artery 04/05/2010  . PERIPHERAL VASCULAR DISEASE 04/05/2010    Past Surgical History:  Procedure Laterality Date  . APPENDECTOMY    . CAROTID ENDARTERECTOMY     Right in 2005, left in 2010  . CATARACT EXTRACTION W/PHACO Right 01/04/2017   Procedure: CATARACT EXTRACTION PHACO AND INTRAOCULAR LENS PLACEMENT (IOC);  Surgeon: Baruch Goldmann, MD;  Location: [facility] ORS;  Service: Ophthalmology;  Laterality: Right;  CDE:  30.25  . COLONOSCOPY N/A 08/19/2015   Procedure: COLONOSCOPY;  Surgeon: Rogene Houston, MD;  Location: [facility] ENDO SUITE;  Service: Endoscopy;  Laterality: N/A;  7:30  . CORONARY ANGIOPLASTY    . ESOPHAGOGASTRODUODENOSCOPY N/A 08/19/2015   Procedure: ESOPHAGOGASTRODUODENOSCOPY (EGD);  Surgeon: Rogene Houston, MD;  Location: [facility];  Service: Endoscopy;  Laterality: N/A;  . ESOPHAGOGASTRODUODENOSCOPY N/A 05/24/2016   Procedure: ESOPHAGOGASTRODUODENOSCOPY (EGD);  Surgeon: Rogene Houston, MD;  Location: [facility];  Service: Endoscopy;  Laterality: N/A;  . ESOPHAGOGASTRODUODENOSCOPY N/A 06/19/2016   Procedure: ESOPHAGOGASTRODUODENOSCOPY (EGD);  Surgeon: Rogene Houston, MD;  Location: [facility];  Service: Endoscopy;  Laterality: N/A;  . ESOPHAGOGASTRODUODENOSCOPY N/A 09/05/2016   Procedure: ESOPHAGOGASTRODUODENOSCOPY (EGD);  Surgeon: Rogene Houston, MD;  Location: [facility];  Service: Endoscopy;  Laterality: N/A;  . GIVENS CAPSULE STUDY N/A 06/20/2016   Procedure: GIVENS CAPSULE STUDY;  Surgeon: Rogene Houston, MD;  Location: [facility];  Service: Endoscopy;  Laterality: N/A;    OB History    Gravida Para Term Preterm AB Living             4   SAB TAB Ectopic Multiple Live Births                   Home Medications    Prior to Admission medications   Medication Sig Start Date End Date Taking? Authorizing Provider  acetaminophen (TYLENOL) 500 MG tablet Take 500 mg by mouth every 6 (six) hours as needed for headache.   Yes [provider]  atorvastatin (LIPITOR) 80 MG tablet TAKE 1 TABLET EVERY NIGHT AT BEDTIME 05/07/16  Yes Satira Sark, MD  bisoprolol (ZEBETA) 5 MG tablet take 1 tablet by mouth twice a day 01/09/17  Yes Satira Sark, MD  metolazone (ZAROXOLYN) 2.5 MG tablet Take 2.5 mg daily as directed. Patient taking differently: Take 2.5 mg by mouth once a week. Take 2.5 mg daily as directed. 12/26/16  Yes Satira Sark, MD  Multiple Vitamin  (MULTIVITAMIN) capsule Take 1 capsule by mouth daily.   Yes [provider]  pantoprazole (PROTONIX) 40 MG tablet Take 1 tablet (40 mg total) by mouth 2 (two) times daily before a meal. 10/12/16  Yes Setzer, Terri L, NP  potassium chloride SA (K-DUR,KLOR-CON) 20 MEQ tablet Take 40 meq (2 tablets) in the am , and take 20 meq (1 tablet) in the pm Patient taking differently: Take 20-40 mEq by mouth See admin instructions. Take 40 meq (2 tablets) in the am , and take 20 meq (1 tablet) in the pm, except on Wednesday take 40 meq twice daily. 11/01/16  Yes Satira Sark, MD  torsemide (DEMADEX) 20 MG tablet Take 4 tablets (80 mg total) by mouth 2 (two) times daily. 10/24/16  Yes Satira Sark, MD  verapamil (CALAN) 80 MG tablet Take 1 tablet (80 mg total) by mouth 2 (two) times daily. 01/14/17  Yes Satira Sark, MD  warfarin (COUMADIN) 5 MG tablet Take 5 mg  by mouth daily at 6 PM. Take 5 mg daily except 2.5 mg on Tuesday and Saturday.   Yes [provider]    Family History Family History  Problem Relation Age of Onset  . Lung cancer Mother   . Cancer Mother   . Hypertension Daughter   . Hyperlipidemia Daughter   . Hypertension Son   . Hypertension Daughter   . Heart disease Maternal Grandmother     Social History Social History  Substance Use Topics  . Smoking status: Former Smoker    Types: Cigarettes    Quit date: 04/30/1987  . Smokeless tobacco: Never Used  . Alcohol use No     Allergies   Diltiazem   Review of Systems Review of Systems   Physical Exam Updated Vital Signs BP (!) 87/31   Pulse (!) 44   Temp (!) 93.2 F (34 C) (Core (Comment))   Resp (!) 29   Ht 5\' 3"  (1.6 m)   Wt 73 kg (161 lb)   SpO2 (!) 88%   BMI 28.52 kg/m   Physical Exam   ED Treatments / Results  Labs (all labs ordered are listed, but only abnormal results are displayed) Labs Reviewed  CBC WITH DIFFERENTIAL/PLATELET - Abnormal; Notable for the following:        Result Value   WBC 28.4 (*)    RBC 3.55 (*)    Hemoglobin 7.5 (*)    HCT 28.5 (*)    MCH 21.1 (*)    MCHC 26.3 (*)    RDW 18.3 (*)    Neutro Abs 23.6 (*)    Monocytes Absolute 4.1 (*)    All other components within normal limits  COMPREHENSIVE METABOLIC PANEL - Abnormal; Notable for the following:    Sodium 128 (*)    Potassium 5.3 (*)    Chloride 91 (*)    CO2 7 (*)    BUN 49 (*)    Creatinine, Ser 2.98 (*)    Albumin 3.3 (*)    AST 51 (*)    Total Bilirubin 1.9 (*)    GFR calc non Af Amer 14 (*)    GFR calc Af Amer 16 (*)    All other components within normal limits  PROTIME-INR - Abnormal; Notable for the following:    Prothrombin Time 56.0 (*)    INR 6.42 (*)    All other components within normal limits  PROTIME-INR - Abnormal; Notable for the following:    Prothrombin Time 22.7 (*)    All other components within normal limits  POC OCCULT BLOOD, ED - Abnormal; Notable for the following:    Fecal Occult Bld POSITIVE (*)    All other components within normal limits  CULTURE, BLOOD (ROUTINE X 2)  CULTURE, BLOOD (ROUTINE X 2)  MRSA PCR SCREENING  PROTIME-INR  URINALYSIS, ROUTINE W REFLEX MICROSCOPIC  PROTIME-INR  CBC  CBC  LACTIC ACID, PLASMA  LACTIC ACID, PLASMA  PROCALCITONIN  TYPE AND SCREEN  PREPARE RBC (CROSSMATCH)    Hemoglobin  Date Value Ref Range Status  01/21/2017 7.5 (L) 12.0 - 15.0 g/dL Final  12/18/2016 9.3 (L) 12.0 - 15.0 g/dL Final  12/14/2016 9.1 (L) 12.0 - 15.0 g/dL Final    Comment:    DELTA CHECK NOTED PREVIOUS HGB DONE IN ED ISTAT.   12/14/2016 6.5 (LL) 12.0 - 15.0 g/dL Final   BUN  Date Value Ref Range Status  01/22/2017 49 (H) 6 - 20 mg/dL Final  01/09/2017 30 (H)  7 - 25 mg/dL Final  12/20/2016 18 6 - 20 mg/dL Final  12/19/2016 16 6 - 20 mg/dL Final   Creat  Date Value Ref Range Status  01/09/2017 1.27 (H) 0.60 - 0.88 mg/dL Final    Comment:    For patients >41 years of age, the reference limit for Creatinine is  approximately 13% higher for people identified as African-American. Marland Kitchen   12/03/2016 1.36 (H) 0.60 - 0.88 mg/dL Final    Comment:      For patients > or = 81 years of age: The upper reference limit for Creatinine is approximately 13% higher for people identified as African-American.     11/14/2016 1.29 (H) 0.60 - 0.88 mg/dL Final    Comment:      For patients > or = 81 years of age: The upper reference limit for Creatinine is approximately 13% higher for people identified as African-American.     10/31/2016 1.08 (H) 0.60 - 0.88 mg/dL Final    Comment:      For patients > or = 81 years of age: The upper reference limit for Creatinine is approximately 13% higher for people identified as African-American.      Creatinine, Ser  Date Value Ref Range Status  01/29/2017 2.98 (H) 0.44 - 1.00 mg/dL Final  12/20/2016 1.13 (H) 0.44 - 1.00 mg/dL Final  12/19/2016 1.11 (H) 0.44 - 1.00 mg/dL Final  12/18/2016 1.13 (H) 0.44 - 1.00 mg/dL Final   POC INR  Date Value Ref Range Status  06/21/2010 2.2    05/22/2010 2.7    04/24/2010 2.9    03/27/2010 2.4     INR  Date Value Ref Range Status  01/31/2017 2.02  Final  01/27/2017 6.42 (HH)  Final    Comment:    REPEATED TO VERIFY CRITICAL RESULT CALLED TO, READ BACK BY AND VERIFIED WITH: [facility] WHITE AT 1025 BY HFLYNT 02/10/2017   01/09/2017 2.3  Final  12/26/2016 2.3  Final  12/20/2016 2.14  Final  12/19/2016 2.52  Final     EKG  EKG Interpretation None      CHA2DS2/VAS Stroke Risk Points      6 >= 2 Points: High Risk  1 - 1.99 Points: Medium Risk  0 Points: Low Risk    The patient's score has not changed in the past year.:  No Change         Details    Note: External data might be a factor in metrics not marked with    Points Metrics   This score determines the patient's risk of having a stroke if the  patient has atrial fibrillation.       1 Has Congestive Heart Failure:  Yes   1 Has Vascular Disease:  Yes   1  Has Hypertension:  Yes   2 Age:  78   0 Has Diabetes:  No   0 Had Stroke:  No Had TIA:  No Had thromboembolism:  No   1 Female:  Yes          Radiology Dg Chest Port 1 View  Result Date: 01/26/2017 CLINICAL DATA:  Leukocytosis EXAM: PORTABLE CHEST 1 VIEW COMPARISON:  12/18/2016 FINDINGS: Cardiac shadow is mildly enlarged but stable. Aortic calcifications are again seen and stable. The lungs are well aerated bilaterally. No bony abnormality is seen. IMPRESSION: No acute abnormality noted. Electronically Signed   By: Inez Catalina M.D.   On: 01/15/2017 11:53    Procedures Procedures (including critical care  time)  Medications Ordered in ED Medications  dextrose 5 % 1,000 mL with sodium bicarbonate 150 mEq infusion ( Intravenous New Bag/Given 01/16/2017 1234)  pantoprazole (PROTONIX) injection 40 mg (not administered)  acetaminophen (TYLENOL) tablet 650 mg (not administered)    Or  acetaminophen (TYLENOL) suppository 650 mg (not administered)  ondansetron (ZOFRAN) tablet 4 mg (not administered)    Or  ondansetron (ZOFRAN) injection 4 mg (not administered)  sodium chloride 0.9 % bolus 1,000 mL (not administered)    And  sodium chloride 0.9 % bolus 1,000 mL (not administered)    And  sodium chloride 0.9 % bolus 250 mL (not administered)  piperacillin-tazobactam (ZOSYN) IVPB 3.375 g (not administered)  vancomycin (VANCOCIN) IVPB 1000 mg/200 mL premix (not administered)  vancomycin (VANCOCIN) IVPB 1000 mg/200 mL premix (not administered)  piperacillin-tazobactam (ZOSYN) 2.25 g in dextrose 5 % 50 mL IVPB (not administered)  sodium chloride 0.9 % bolus 500 mL (0 mLs Intravenous Stopped 01/16/2017 1020)  ondansetron (ZOFRAN) injection 4 mg (4 mg Intravenous Given 02/09/2017 0942)  acetaminophen (TYLENOL) tablet 650 mg (650 mg Oral Given 02/10/2017 1025)  prothrombin complex conc human (KCENTRA) IVPB 3,877 Units (0 Units Intravenous Stopped 02/05/2017 1205)  phytonadione (VITAMIN K) 10 mg in dextrose  5 % 50 mL IVPB (0 mg Intravenous Stopped 02/10/2017 1243)  sodium chloride 0.9 % bolus 500 mL (0 mLs Intravenous Stopped 02/13/2017 1210)  0.9 %  sodium chloride infusion (10 mL/hr Intravenous New Bag/Given 01/23/2017 1220)  sodium chloride 0.9 % bolus 500 mL (0 mLs Intravenous Stopped 02/07/2017 1235)  sodium bicarbonate injection 50 mEq (50 mEq Intravenous Given 01/14/2017 1229)     Initial Impression / Assessment and Plan / ED Course  I have reviewed the triage vital signs and the nursing notes.  Pertinent labs & imaging results that were available during my care of the patient were reviewed by me and considered in my medical decision making (see chart for details).  Clinical Course as of Jan 17 1537  Thu Jan 17, 2017  1058 Low BP: (!) 93/25 [EW]  1058 Low Sodium: (!) 128 [EW]  1059 High Potassium: (!) 5.3 [EW]  1059 High Creatinine: (!) 2.98 [EW]  1059 Abnormal Fecal Occult Blood, POC: (!) POSITIVE [EW]  1059 WBC: (!) 28.4 [EW]  1059 Abnormally high INR: (!!) 6.42 [EW]  1059 High WBC: (!) 28.4 [EW]  1059 Low Hemoglobin: (!) 7.5 [EW]  1059 The evaluation is consistent with acute rectal bleeding associated with Coumadin toxicity, and markedly elevated INR.  INR will need to be reversed, with Kcentra.  Mild hypotension, with efficiency suspect complicated by volume depletion, multifactorial.  Patient requires aggressive hydration and will need to be admitted to the hospital.  Blood transfusions ordered.  [EW]  [facility] gastroenterology for assistance with care, while hospitalized  [EW]    Clinical Course User Index [EW] Daleen Bo, MD     Patient Vitals for the past 24 hrs:  BP Temp Temp src Pulse Resp SpO2 Height Weight  01/16/2017 1528 - (!) 93.2 F (34 C) Core (!) 44 (!) 29 (!) 88 % - -  01/28/2017 1515 (!) 87/31 (!) 93 F (33.9 C) - 61 (!) 32 96 % - -  01/18/2017 1513 (!) 100/54 (!) 93 F (33.9 C) Core 82 (!) 32 100 % - -  01/16/2017 1500 - - - - - 94 % - -  02/07/2017 1450 (!) 89/33 (!)  93 F (33.9 C) Core (!) 57 (!) 27 94 % - -  01/15/2017 1430 96/62 - - (!) 44 (!) 22 (!) 89 % - -  01/16/2017 1400 (!) 96/35 - - (!) 48 20 92 % - -  02/05/2017 1330 (!) 89/35 - - (!) 49 (!) 31 90 % - -  01/18/2017 1300 (!) 100/25 - - (!) 48 (!) 27 (!) 88 % - -  02/02/2017 1236 (!) 82/22 (!) 91.3 F (32.9 C) Rectal (!) 46 (!) 25 91 % - -  01/29/2017 1219 (!) 84/71 (!) 91.4 F (33 C) Rectal (!) 44 (!) 24 - - -  02/08/2017 1218 (!) 84/71 - - - (!) 28 - - -  02/02/2017 1207 (!) 81/28 - - (!) 46 (!) 30 100 % - -  01/24/2017 1200 (!) 81/28 - - (!) 50 (!) 33 95 % - -  02/06/2017 1145 91/70 - - (!) 48 (!) 31 97 % - -  02/12/2017 1130 (!) 65/18 - - (!) 49 (!) 27 97 % - -  01/21/2017 1115 (!) 79/47 - - (!) 45 (!) 21 92 % - -  01/30/2017 1100 (!) 94/26 - - (!) 48 (!) 24 100 % - -  01/14/2017 1045 (!) 96/30 - - - (!) 21 - - -  02/12/2017 1030 (!) 93/25 - - - (!) 23 - - -  02/05/2017 1015 (!) 93/31 - - (!) 52 (!) 24 99 % - -  01/21/2017 0945 (!) 102/28 - - (!) 50 (!) 26 92 % - -  02/13/2017 0942 (!) 90/34 - - - (!) 25 - - -  01/18/2017 0930 (!) 93/28 - - - (!) 21 - - -  01/16/2017 0920 (!) 101/32 - - - (!) 27 - - -  02/02/2017 0916 (!) 91/59 - - 60 19 - - -  01/30/2017 0901 (!) 117/25 - - - - - - -  01/27/2017 0900 (!) 103/29 98.6 F (37 C) Oral 60 - 100 % - -  02/12/2017 0856 - - - - - - 5\' 3"  (1.6 m) 73 kg (161 lb)    11:22 AM-Consult complete with hospitalist. Patient case explained and discussed.  The agrees to admit patient for further evaluation and treatment. Call ended at 19: 58    CRITICAL CARE Performed by: Daleen Bo L Total critical care time: 50 minutes Critical care time was exclusive of separately billable procedures and treating other patients. Critical care was necessary to treat or prevent imminent or life-threatening deterioration. Critical care was time spent personally by me on the following activities: development of treatment plan with patient and/or surrogate as well as nursing, discussions with consultants,  evaluation of patient's response to treatment, examination of patient, obtaining history from patient or surrogate, ordering and performing treatments and interventions, ordering and review of laboratory studies, ordering and review of radiographic studies, pulse oximetry and re-evaluation of patient's condition.   Final Clinical Impressions(s) / ED Diagnoses   Final diagnoses:  Poisoning by warfarin sodium, accidental or unintentional, initial encounter  Rectal bleeding  Anemia, unspecified type  AKI (acute kidney injury) ([place])  Hyperkalemia  Leukocytosis, unspecified type   Rectal bleeding, with Coumadin toxicity.  Prior GI source for bleeding gastric angiodysplasia.  She had a normal colonoscopy, in 2017.  Moderate elevation of creatinine consistent with volume depletion, dehydration.,  Likely multifactorial with dehydration, and blood in the intestinal tract.  Mild hypotension is likely multifactorial as well.  Patient will require hospitalization for treatment stabilization.  Gastroenterology has been consulted.   Nursing Notes Reviewed/ Care Coordinated  Applicable Imaging Reviewed Interpretation of Laboratory Data incorporated into ED treatment   Plan: [place] Prescriptions Current Discharge Medication List       Daleen Bo, MD 01/21/2017 1542

## 2017-01-17 NOTE — Progress Notes (Signed)
Family requesting increase on patient's morphine dose and debating putting patient on a morphine gtt. MD notified and made aware. New order given to give Morphine 2mg  IV push x 1 time only at this time until further decisions decided by the family.

## 2017-01-17 NOTE — ED Notes (Signed)
CRITICAL VALUE ALERT  Critical Value:  INR 6.42  Date & Time Notied:  01/16/2017 1025  Provider Notified: Dr. Eulis Foster  Orders Received/Actions taken: EDP notified, no further orders given

## 2017-01-17 NOTE — Progress Notes (Signed)
KUB reveals gas-filled stomach but no other abnormalities noted. Will leave her nothing by mouth for now.

## 2017-01-17 NOTE — Progress Notes (Signed)
Case discussed with Dr. Laural Golden. Possibility of ischemic injury to bowel has been raised, which is certainly a possibility. Unfortunately, at this time patient does not appear stable enough to transport for CT imaging. In any case, she does not appear to be an appropriate candidate for invasive surgical management and family is in agreement with this. Will continue with supportive treatment for now. Continue with IV fluids and antibiotics. She has been transfused 2 unit prbc with follow up cbc pending. INR has been reversed. She is now on a nonrebreather. She remains hypotensive and hypothermic. She is on intermittent morphine for pain. Family has requested a morphine infusion to better pain management. They understand that her condition may deteriorate, but they do not want her to be in pain. If she does stabilize overnight, can consider further imaging with CT abd in AM, otherwise continue current management.   [name]

## 2017-01-18 ENCOUNTER — Encounter (HOSPITAL_COMMUNITY): Payer: Self-pay | Admitting: Primary Care

## 2017-01-18 DIAGNOSIS — Z7189 Other specified counseling: Secondary | ICD-10-CM

## 2017-01-18 DIAGNOSIS — Z515 Encounter for palliative care: Secondary | ICD-10-CM

## 2017-01-18 LAB — PROTIME-INR
INR: 2.37
INR: 2.46
Prothrombin Time: 25.7 seconds — ABNORMAL HIGH (ref 11.4–15.2)
Prothrombin Time: 26.5 seconds — ABNORMAL HIGH (ref 11.4–15.2)

## 2017-01-18 LAB — BASIC METABOLIC PANEL
Anion gap: 20 — ABNORMAL HIGH (ref 5–15)
BUN: 52 mg/dL — AB (ref 6–20)
CHLORIDE: 86 mmol/L — AB (ref 101–111)
CO2: 23 mmol/L (ref 22–32)
CREATININE: 3.09 mg/dL — AB (ref 0.44–1.00)
Calcium: 8.3 mg/dL — ABNORMAL LOW (ref 8.9–10.3)
GFR calc Af Amer: 15 mL/min — ABNORMAL LOW (ref >60)
GFR calc non Af Amer: 13 mL/min — ABNORMAL LOW (ref >60)
GLUCOSE: 91 mg/dL (ref 65–99)
POTASSIUM: 5 mmol/L (ref 3.5–5.1)
SODIUM: 129 mmol/L — AB (ref 135–145)

## 2017-01-18 LAB — CBC
HCT: 33.5 % — ABNORMAL LOW (ref 36.0–46.0)
HCT: 32.9 % — ABNORMAL LOW (ref 36.0–46.0)
HEMATOCRIT: 31.8 % — AB (ref 36.0–46.0)
Hemoglobin: 9.3 g/dL — ABNORMAL LOW (ref 12.0–15.0)
Hemoglobin: 9.8 g/dL — ABNORMAL LOW (ref 12.0–15.0)
Hemoglobin: 9.8 g/dL — ABNORMAL LOW (ref 12.0–15.0)
MCH: 23 pg — ABNORMAL LOW (ref 26.0–34.0)
MCH: 22.8 pg — ABNORMAL LOW (ref 26.0–34.0)
MCH: 22.7 pg — AB (ref 26.0–34.0)
MCHC: 29.2 g/dL — ABNORMAL LOW (ref 30.0–36.0)
MCHC: 29.3 g/dL — AB (ref 30.0–36.0)
MCHC: 29.8 g/dL — AB (ref 30.0–36.0)
MCV: 78.5 fL (ref 78.0–100.0)
MCV: 78.1 fL (ref 78.0–100.0)
MCV: 76.2 fL — AB (ref 78.0–100.0)
PLATELETS: 201 10*3/uL (ref 150–400)
Platelets: 222 10*3/uL (ref 150–400)
Platelets: 211 10*3/uL (ref 150–400)
RBC: 4.05 MIL/uL (ref 3.87–5.11)
RBC: 4.29 MIL/uL (ref 3.87–5.11)
RBC: 4.32 MIL/uL (ref 3.87–5.11)
RDW: 17.9 % — AB (ref 11.5–15.5)
RDW: 18.1 % — AB (ref 11.5–15.5)
RDW: 17.9 % — AB (ref 11.5–15.5)
WBC: 45.9 10*3/uL — ABNORMAL HIGH (ref 4.0–10.5)
WBC: 44.7 10*3/uL — AB (ref 4.0–10.5)
WBC: 45.4 10*3/uL — AB (ref 4.0–10.5)

## 2017-01-18 LAB — MRSA PCR SCREENING: MRSA by PCR: NEGATIVE

## 2017-01-18 LAB — LACTIC ACID, PLASMA: Lactic Acid, Venous: 9.3 mmol/L (ref 0.5–1.9)

## 2017-01-18 NOTE — Plan of Care (Deleted)
Problem: Activity: Goal: Risk for activity intolerance will decrease Outcome: Progressing Assisted with ambulation to bedside commode

## 2017-01-18 NOTE — Progress Notes (Signed)
   Chart reviewed. While patient is not actively bleeding her condition has deteriorated. She has 0 urine output. Serum creatinine is going up. Patient is unresponsive to vocal stimuli. I agree with decision to do comfort measures given patient's condition and lack of any improvement despite aggressive therapy for the first 24 hours. Condition discussed with patient's 3 daughters who are at bedside along with other family members.

## 2017-01-18 NOTE — Progress Notes (Signed)
Decreased HIGH Flow nasal cannula to 8 lpm after filling with water. Saturation 93

## 2017-01-18 NOTE — Progress Notes (Signed)
[facility] notified of lactic of 18. No new orders noted

## 2017-01-18 NOTE — Plan of Care (Signed)
Problem: Safety: Goal: Ability to remain free from injury will improve Outcome: Progressing Side rails up, bed in low position, call bell within reach. Family at bedside

## 2017-01-18 NOTE — Consult Note (Signed)
Consultation Note Date: 01/18/2017   Patient Name: [name]  DOB: [birth year]  MRN: [id number]  Age / Sex: 83 y.o., female  PCP: [name], MD Referring Physician: Kathie Dike, MD  Reason for Consultation: Establishing goals of care and Psychosocial/spiritual support  HPI/Patient Profile: 83 y.o. female  with past medical history of chronic a. With anticoagulati hypertension, history of gastric ulcer, G.I. Bleed, admitted on 02/07/2017 with hypotension, sepsis, possible/likely ischemic bowel.   Clinical Assessment and Goals of Care: [name] "[name]" is resting quietly in bed. She is basically unresponsive, but will flutter her eyes and give minimal nods at times. Present today at bedside is son [name]. We talk about [name]'s current health concerns.He states that he and his family's goal is for comfort for their mother. We talk about unburdening  IV antibiotics that are not changing what's happening, also lab draws and blood sugar checks. [name] agrees that family wants to focus on comfort for [name].  [name] states that she has told him for the last 5 years that she is ready to go be with her deceased husband.Family seems that piece about [name]'s imminent death. [name] states that he understands she can still hear [place], he asks "Is she with [place]?".  I share that it's generally accepted that people can hear what is said around them as they are dying.  [name] states hat his 3 sisters will return about 1 or 1:30, and he requests a return visit.  Conference with nursing staff related to comfort care.  Support given. Booklets provided "gone from my sight", and "hard choices for loving people".  I returned later to meet with family. Present at bedside are all adult children. We talk about comfort measures. Family states that they are interested in comfort measures when the rest of  the family arrives, likely around 8 PM tonight.  We talk about comfort measures,  signs and symptoms of discomfort. I share that medicine can prolong life, but also the dying process and therefore suffering.  Family states they do not want to prolong life/dying process.  We talk about what might be tethering [name] here. We discuss weaning oxygen when other family member arrive, and also removing the warming blanket.  All questions answered.   Family declines Hospice home.  I believe [name] is too unstable, imminent death to transfer to inpatient hospice.   Healthcare power of attorney NEXT OF KIN - four children make choices together   SUMMARY OF RECOMMENDATIONS   At this point no extraordinary measures, comfort measures.  Code Status/Advance Care Planning:  DNR - we discussed the concepts of allow a natural death, and let nature take its course.  Symptom Management:   Per hospitalist no additional needs at this time.  Continuous morphine infusion for comfort, effective.  Palliative Prophylaxis:   Frequent Pain Assessment, Oral Care and Turn Reposition  Additional Recommendations (Limitations, Scope, Preferences):  No IV antibiotics, no lab draws, keep patient comfortable.  Psycho-social/Spiritual:   Desire for further Chaplaincy support:no  Additional Recommendations: Caregiving  Support/Resources  Prognosis:   Hours - Days  Discharge Planning: Anticipated Hospital Death      Primary Diagnoses: Present on Admission: . Hypotension . Dehydration . Symptomatic anemia . GI bleed . CKD (chronic kidney disease) stage 3, GFR 30-59 ml/min (HCC) . Acute blood loss anemia . Chronic atrial fibrillation ([place]) . Metabolic acidosis . Coumadin toxicity . Hyponatremia   I have reviewed the medical record, interviewed the patient and family, and examined the patient. The following aspects are pertinent.  Past Medical History:  Diagnosis Date  . Angiodysplasia of stomach  and duodenum   . Carotid artery disease ([place])    RIght CEA 05/2004  . Chronic anticoagulation   . Chronic atrial fibrillation ([place])   . Chronic blood loss anemia   . Chronic diastolic CHF (congestive heart failure) ([place])   . Coronary atherosclerosis of native coronary artery    a. RCA stent 2003 with 60% LCx, 60% OM1, 80% OM2 disease at that time.  . Diverticulosis   . Essential hypertension   . Gastric ulcer   . GI bleeding    Recurrent GIB (prior gastric ulcer/diverticulosis/AVM in 2017, gastric AVM s/p endoscopic therapy in 05/2016, recurrent GIB 06/2016 with AVM s/p therapy on endoscopy.  . Hyperlipidemia   . Lymphedema   . Obesity   . Renal artery stenosis (HCC)    Bilateral renal artery stents 2004  . Tobacco abuse, in remission    40 pack year stopped in [place] History  . Marital status: Widowed    Spouse name: N/A  . Number of children: 4  . Years of education: N/A   Occupational History  . housewife Retired   Social History Main Topics  . Smoking status: Former Smoker    Types: Cigarettes    Quit date: 04/30/1987  . Smokeless tobacco: Never Used  . Alcohol use No  . Drug use: No  . Sexual activity: Not Asked   Other Topics Concern  . None   Social History Narrative  . None   Family History  Problem Relation Age of Onset  . Lung cancer Mother   . Cancer Mother   . Hypertension Daughter   . Hyperlipidemia Daughter   . Hypertension Son   . Hypertension Daughter   . Heart disease Maternal Grandmother    Scheduled Meds: . pantoprazole (PROTONIX) IV  40 mg Intravenous Q12H   Continuous Infusions: . dextrose 5 % 1,000 mL with sodium bicarbonate 150 mEq infusion 50 mL/hr at 01/18/17 1059  . morphine 1.5 mg/hr (01/18/17 1107)   PRN Meds:.acetaminophen **OR** acetaminophen, ondansetron **OR** ondansetron (ZOFRAN) IV Medications Prior to Admission:  Prior to Admission medications   Medication Sig Start Date End Date Taking?  Authorizing Provider  acetaminophen (TYLENOL) 500 MG tablet Take 500 mg by mouth every 6 (six) hours as needed for headache.   Yes [provider]  atorvastatin (LIPITOR) 80 MG tablet TAKE 1 TABLET EVERY NIGHT AT BEDTIME 05/07/16  Yes Satira Sark, MD  bisoprolol (ZEBETA) 5 MG tablet take 1 tablet by mouth twice a day 01/09/17  Yes Satira Sark, MD  metolazone (ZAROXOLYN) 2.5 MG tablet Take 2.5 mg daily as directed. Patient taking differently: Take 2.5 mg by mouth once a week. Take 2.5 mg daily as directed. 12/26/16  Yes Satira Sark, MD  Multiple Vitamin (MULTIVITAMIN) capsule Take 1 capsule by mouth daily.   Yes [provider]  pantoprazole (PROTONIX)  40 MG tablet Take 1 tablet (40 mg total) by mouth 2 (two) times daily before a meal. 10/12/16  Yes Setzer, Terri L, NP  potassium chloride SA (K-DUR,KLOR-CON) 20 MEQ tablet Take 40 meq (2 tablets) in the am , and take 20 meq (1 tablet) in the pm Patient taking differently: Take 20-40 mEq by mouth See admin instructions. Take 40 meq (2 tablets) in the am , and take 20 meq (1 tablet) in the pm, except on Wednesday take 40 meq twice daily. 11/01/16  Yes Satira Sark, MD  torsemide (DEMADEX) 20 MG tablet Take 4 tablets (80 mg total) by mouth 2 (two) times daily. 10/24/16  Yes Satira Sark, MD  verapamil (CALAN) 80 MG tablet Take 1 tablet (80 mg total) by mouth 2 (two) times daily. 01/14/17  Yes Satira Sark, MD  warfarin (COUMADIN) 5 MG tablet Take 5 mg by mouth daily at 6 PM. Take 5 mg daily except 2.5 mg on Tuesday and Saturday.   Yes [provider]   Allergies  Allergen Reactions  . Diltiazem Other (See Comments)    Edema   Review of Systems  Unable to perform ROS: Mental status change    Physical Exam  Constitutional: No distress.  unresponsive   HENT:  Head: Atraumatic.  Cardiovascular: Normal rate.   Pulmonary/Chest: Effort normal. No respiratory distress.  Abdominal: She  exhibits distension.  Neurological:  Unresponsive   Skin: Skin is warm and dry.  Nursing note and vitals reviewed.   Vital Signs: BP 92/67   Pulse 68   Temp 97.7 F (36.5 C)   Resp 11   Ht 5\' 3"  (1.6 m)   Wt 73 kg (161 lb)   SpO2 96%   BMI 28.52 kg/m  Pain Assessment: PAINAD POSS *See Group Information*: S-Acceptable,Sleep, easy to arouse     SpO2: SpO2: 96 % O2 Device:SpO2: 96 % O2 Flow Rate: .O2 Flow Rate (L/min): 15 L/min  IO: Intake/output summary:  Intake/Output Summary (Last 24 hours) at 01/18/17 1158 Last data filed at 01/18/17 0600  Gross per 24 hour  Intake           4160.1 ml  Output                0 ml  Net           4160.1 ml    LBM: Last BM Date: 01/15/2017 Baseline Weight: Weight: 73 kg (161 lb) Most recent weight: Weight: 73 kg (161 lb)     Palliative Assessment/Data:   Flowsheet Rows     Most Recent Value  Intake Tab  Referral Department  Hospitalist  Unit at Time of Referral  ICU  Palliative Care Primary Diagnosis  Sepsis/Infectious Disease  Date Notified  01/18/17  Palliative Care Type  New Palliative care  Reason for referral  Clarify Goals of Care  Clinical Assessment  Palliative Performance Scale Score  10%  Pain Max last 24 hours  Not able to report  Pain Min Last 24 hours  Not able to report  Dyspnea Max Last 24 Hours  Not able to report  Dyspnea Min Last 24 hours  Not able to report  Psychosocial & Spiritual Assessment  Palliative Care Outcomes  Patient/Family meeting held?  Yes  Who was at the meeting?  son [name] at bedside.   Palliative Care Outcomes  Provided psychosocial or spiritual support, Clarified goals of care, Provided end of life care assistance  Patient/Family wishes: Interventions discontinued/not started  Mechanical Ventilation      Time In: 1000 - 1400 Time Out: 1050 -1430 Time Total: 50 + 30 = 80 minutes Greater than 50%  of this time was spent counseling and coordinating care related to the above assessment  and plan.  Signed by: Drue Novel, NP   Please contact Palliative Medicine Team phone at [phone] for questions and concerns.  For individual provider: See Shea Evans

## 2017-01-18 NOTE — Progress Notes (Signed)
PROGRESS NOTE    [name]  ZHY:[id number] DOB: [birth year] DOA: 02/12/2017 PCP: [name], MD    Brief Narrative:  83 year old female with multiple medical problems, presented to the hospital with generalized weakness. Found to have acute renal failure, leukocytosis and lactic acidosis. Suspect that she may have underlying bowel ischemia. She was aggressively hydrated with IV fluids for hypotension and started on a warming blanket for hypothermia. Overall prognosis remains poor. Seen by palliative care and patient was transitioned to comfort measures. Currently on a morphine infusion for comfort. Anticipate in hospital death   Assessment & Plan:   Active Problems:   Chronic atrial fibrillation (HCC)   Chronic anticoagulation   Symptomatic anemia   Acute blood loss anemia   CKD (chronic kidney disease) stage 3, GFR 30-59 ml/min (HCC)   GI bleed   Hypotension   AKI (acute kidney injury) ([place])   Dehydration   Metabolic acidosis   Coumadin toxicity   Hyponatremia   Goals of care, counseling/discussion   Palliative care encounter   End of life care   1. Hypotension. Suspect this is related to hypovolemia as well as shock from possible ischemic bowel. She has been aggressively hydrated and blood pressure appears to be stabilizing. She did not require vasopressors. She continues to be hypothermic on warming blanket. She was started on intravenous antibiotics and blood cultures have been sent. 2. Acute blood loss anemia. Transfuse 2 units of PRBCs with appropriate response of hemoglobin. No further evidence of bleeding. 3. GI bleeding. Heme positive stools. GI following. No evidence of ongoing bleeding at this time. Possibly related to bowel ischemia. 4. Acute kidney injury and chronic kidney disease stage III. Despite aggressive IV hydration, renal function has failed to improve. Urine output is minimal. 5. Metabolic acidosis. Related to lactic acidosis from possible bowel  ischemia. She was started on bicarbonate infusion with improvement of labs. 6. Chronic atrial fibrillation. Rate control medications and anticoagulations on hold due to acute issues. 7. Goals of care. Palliative care following. Family has decided to transition patient towards comfort measures. She is on morphine infusion for comfort. Antibiotics have been discontinued. We'll likely discontinue further IV fluids and warming blanket once the remainder of the family arrives tonight.    DVT prophylaxis: none Code Status: DNR, comfort measures Family Communication: discussed with daughters at the bedside Disposition Plan: anticipate in hospital death   Consultants:   Gastroenterology  Palliative care  Procedures:     Antimicrobials:   Vancomycin 10/4>10/5  Zosyn 10/4>10/5   Subjective: Patient is unresponsive  Objective: Vitals:   01/18/17 0900 01/18/17 1000 01/18/17 1100 01/18/17 1200  BP: 92/67 (!) 107/44 (!) 102/43 (!) 96/44  Pulse: 68 63 62 63  Resp: 11 (!) 9 10 10   Temp: 97.7 F (36.5 C) 97.9 F (36.6 C) 97.9 F (36.6 C) 98.1 F (36.7 C)  TempSrc:      SpO2: (!) 88% 98% 98% 95%  Weight:      Height:        Intake/Output Summary (Last 24 hours) at 01/18/17 1516 Last data filed at 01/18/17 1200  Gross per 24 hour  Intake          2715.02 ml  Output                0 ml  Net          2715.02 ml   Filed Weights   01/28/2017 0856  Weight: 73 kg (161 lb)  Examination:  General exam: Appears calm and comfortable  Respiratory system: bilateral rhonchi. Respiratory effort normal. Cardiovascular system: S1 & S2 heard, irregular. No JVD, murmurs, rubs, gallops or clicks. No pedal edema. Gastrointestinal system: Abdomen is nondistended, soft and nontender. No organomegaly or masses felt. Normal bowel sounds heard.  Data Reviewed: I have personally reviewed following labs and imaging studies  CBC:  Recent Labs Lab 02/09/2017 0909 02/13/2017 1857 01/18/17 0026  01/18/17 0357 01/18/17 0830  WBC 28.4* 43.7* 44.7* 45.4* 45.9*  NEUTROABS 23.6*  --   --   --   --   HGB 7.5* 9.0* 9.3* 9.8* 9.8*  HCT 28.5* 32.7* 31.8* 33.5* 32.9*  MCV 80.3 83.2 78.5 78.1 76.2*  PLT 322 258 222 201 237   Basic Metabolic Panel:  Recent Labs Lab 02/03/2017 0909 01/18/17 0357  NA 128* 129*  K 5.3* 5.0  CL 91* 86*  CO2 7* 23  GLUCOSE 87 91  BUN 49* 52*  CREATININE 2.98* 3.09*  CALCIUM 9.6 8.3*   GFR: Estimated Creatinine Clearance: 13.4 mL/min (A) (by C-G formula based on SCr of 3.09 mg/dL (H)). Liver Function Tests:  Recent Labs Lab 02/12/2017 0909  AST 51*  ALT 24  ALKPHOS 106  BILITOT 1.9*  PROT 7.6  ALBUMIN 3.3*   No results for input(s): LIPASE, AMYLASE in the last 168 hours. No results for input(s): AMMONIA in the last 168 hours. Coagulation Profile:  Recent Labs Lab 01/15/2017 0909 01/28/2017 1302 02/12/2017 1857 01/18/17 0026 01/18/17 0357  INR 6.42* 2.02 2.37 2.46 2.37   Cardiac Enzymes: No results for input(s): CKTOTAL, CKMB, CKMBINDEX, TROPONINI in the last 168 hours. BNP (last 3 results) No results for input(s): PROBNP in the last 8760 hours. HbA1C: No results for input(s): HGBA1C in the last 72 hours. CBG:  Recent Labs Lab 02/11/2017 1838  GLUCAP 154*   Lipid Profile: No results for input(s): CHOL, HDL, LDLCALC, TRIG, CHOLHDL, LDLDIRECT in the last 72 hours. Thyroid Function Tests: No results for input(s): TSH, T4TOTAL, FREET4, T3FREE, THYROIDAB in the last 72 hours. Anemia Panel: No results for input(s): VITAMINB12, FOLATE, FERRITIN, TIBC, IRON, RETICCTPCT in the last 72 hours. Sepsis Labs:  Recent Labs Lab 01/22/2017 0909 02/07/2017 1857 01/18/17 0026  PROCALCITON 0.26  --   --   LATICACIDVEN  --  18.0* 9.3*    Recent Results (from the past 240 hour(s))  Culture, blood (x 2)     Status: None (Preliminary result)   Collection Time: 01/16/2017  6:57 PM  Result Value Ref Range Status   Specimen Description RIGHT  ANTECUBITAL  Final   Special Requests   Final    BOTTLES DRAWN AEROBIC AND ANAEROBIC Blood Culture adequate volume   Culture NO GROWTH < 12 HOURS  Final   Report Status PENDING  Incomplete  Culture, blood (x 2)     Status: None (Preliminary result)   Collection Time: 02/03/2017  6:57 PM  Result Value Ref Range Status   Specimen Description BLOOD RIGHT HAND  Final   Special Requests Blood Culture adequate volume  Final   Culture NO GROWTH < 12 HOURS  Final   Report Status PENDING  Incomplete  MRSA PCR Screening     Status: None   Collection Time: 01/31/2017 10:36 PM  Result Value Ref Range Status   MRSA by PCR NEGATIVE NEGATIVE Final    Comment:        The GeneXpert MRSA Assay (FDA approved for NASAL specimens only), is one component of  a comprehensive MRSA colonization surveillance program. It is not intended to diagnose MRSA infection nor to guide or monitor treatment for MRSA infections.          Radiology Studies: Dg Abd 1 View  Result Date: 02/09/2017 CLINICAL DATA:  Abdominal pain. EXAM: ABDOMEN - 1 VIEW COMPARISON:  Sep 04, 2016. FINDINGS: Prominent gaseous distention of the stomach. The remaining bowel gas pattern is normal. No abnormal soft tissue calcifications. Degenerative changes of the lumbar spine. IMPRESSION: Prominent gaseous distention of the stomach. No evidence of bowel obstruction. Electronically Signed   By: Titus Dubin M.D.   On: 01/18/2017 17:16   Dg Chest Port 1 View  Result Date: 01/14/2017 CLINICAL DATA:  Leukocytosis EXAM: PORTABLE CHEST 1 VIEW COMPARISON:  12/18/2016 FINDINGS: Cardiac shadow is mildly enlarged but stable. Aortic calcifications are again seen and stable. The lungs are well aerated bilaterally. No bony abnormality is seen. IMPRESSION: No acute abnormality noted. Electronically Signed   By: Inez Catalina M.D.   On: 02/01/2017 11:53        Scheduled Meds: . pantoprazole (PROTONIX) IV  40 mg Intravenous Q12H   Continuous  Infusions: . dextrose 5 % 1,000 mL with sodium bicarbonate 150 mEq infusion 50 mL/hr at 01/18/17 1059  . morphine 2 mg/hr (01/18/17 1154)     LOS: 1 day    Time spent: 18mins    Kismet Facemire, MD [facility] Pager [phone]  If 7PM-7AM, please contact night-coverage [email or web address] Password [email or web address] 01/18/2017, 3:16 PM

## 2017-01-21 ENCOUNTER — Other Ambulatory Visit: Payer: Self-pay | Admitting: *Deleted

## 2017-01-21 LAB — TYPE AND SCREEN
ABO/RH(D): O POS
ANTIBODY SCREEN: NEGATIVE
UNIT DIVISION: 0
Unit division: 0
Unit division: 0

## 2017-01-21 LAB — BPAM RBC
BLOOD PRODUCT EXPIRATION DATE: 201810152359
BLOOD PRODUCT EXPIRATION DATE: 201810162359
Blood Product Expiration Date: 201810152359
ISSUE DATE / TIME: 201810041150
ISSUE DATE / TIME: 201810041501
UNIT TYPE AND RH: 5100
UNIT TYPE AND RH: 5100
Unit Type and Rh: 5100

## 2017-01-21 NOTE — Patient Outreach (Signed)
[facility]) Care Management  01/21/2017  JAZMYN OFFNER 1932/08/14 [id number]   [facility] CM reviewed EPIC 01/21/17 after notified admission by daughters  Noted: Creatinine remained elevated at 3. clinically, the patient did not have any significant improvement After meeting with palliative care, family had elected to transition the patient to comfort care. She was started on morphine infusion for pain and respiratory distress. Antibiotics were discontinued and IV fluids were discontinued. The patient quickly decompensated and was found to be without respirations/heart sounds at 9:15am on 01-25-2017. Family was present at bedside  [facility] CM will close [facility] CM case for closure reason; Deceased 25-Jan-2017 [facility] CMA, Helene Kelp, updated  PCP closure letter sent to Dr Dante Gang L. Lavina Hamman, RN, BSN, [place] Care Management [phone]

## 2017-01-22 LAB — CULTURE, BLOOD (ROUTINE X 2)
CULTURE: NO GROWTH
CULTURE: NO GROWTH
SPECIAL REQUESTS: ADEQUATE
SPECIAL REQUESTS: ADEQUATE

## 2017-01-28 ENCOUNTER — Ambulatory Visit: Payer: Self-pay | Admitting: Cardiology

## 2017-02-11 ENCOUNTER — Other Ambulatory Visit (HOSPITAL_COMMUNITY): Payer: Self-pay

## 2017-02-14 NOTE — Progress Notes (Signed)
PROGRESS NOTE    [name]  ZCH:[id number] DOB: 08-10-1932 DOA: 02/10/2017 PCP: [name], MD    Brief Narrative:  83 year old female with multiple medical problems, presented to the hospital with generalized weakness. Found to have acute renal failure, leukocytosis and lactic acidosis. Suspect that she may have underlying bowel ischemia. She was aggressively hydrated with IV fluids for hypotension and started on a warming blanket for hypothermia. Overall prognosis remains poor. Seen by palliative care and patient was transitioned to comfort measures. Currently on a morphine infusion for comfort. Anticipate in hospital death   Assessment & Plan:   Active Problems:   Chronic atrial fibrillation (HCC)   Chronic anticoagulation   Symptomatic anemia   Acute blood loss anemia   CKD (chronic kidney disease) stage 3, GFR 30-59 ml/min (HCC)   GI bleed   Hypotension   AKI (acute kidney injury) ([place])   Dehydration   Metabolic acidosis   Coumadin toxicity   Hyponatremia   Goals of care, counseling/discussion   Palliative care encounter   End of life care   1. Hypotension. Suspect this is related to hypovolemia as well as shock from possible ischemic bowel. She was aggressively hydrated and blood pressure initially began to improve. She did not require vasopressors. She was also hypothermic and was placed on warming blanket. She was started on intravenous antibiotics and blood cultures have shown no growth. After discussion with palliative care, family has elected comfort measures. IV fluids have been discontinued. Blood pressures have since been decreasing. 2. Acute blood loss anemia. Transfused 2 units of PRBCs with appropriate response of hemoglobin. No further evidence of bleeding. 3. GI bleeding. Heme positive stools. GI following. No evidence of ongoing bleeding at this time. Possibly related to bowel ischemia. 4. Acute kidney injury and chronic kidney disease stage III.  Despite aggressive IV hydration, renal function has failed to improve. Urine output is minimal. 5. Metabolic acidosis. Related to lactic acidosis from possible bowel ischemia. She was started on bicarbonate infusion with improvement of labs. 6. Chronic atrial fibrillation. Rate control medications and anticoagulations on hold due to acute issues. 7. Goals of care. Palliative care following. Family has decided to transition patient towards comfort measures. She is on morphine infusion for comfort. Antibiotics have been discontinued. Plans on weaning off oxygen and titrating  Morphine for comfort.    DVT prophylaxis: none Code Status: DNR, comfort measures Family Communication: discussed with daughters at the bedside Disposition Plan: anticipate in hospital death   Consultants:   Gastroenterology  Palliative care  Procedures:     Antimicrobials:   Vancomycin 10/4>10/5  Zosyn 10/4>10/5   Subjective: Patient is unresponsive  Objective: Vitals:   02-15-2017 0500 02-15-2017 0600 2017/02/15 0700 02-15-2017 0800  BP: (!) 78/31 (!) 64/25 (!) 68/27   Pulse: 61   (!) 54  Resp: 15 13 19 18   Temp: 97.9 F (36.6 C) (!) 97.5 F (36.4 C) (!) 97.5 F (36.4 C) 97.7 F (36.5 C)  TempSrc:      SpO2: (!) 86%   (!) 89%  Weight:      Height:        Intake/Output Summary (Last 24 hours) at Feb 15, 2017 0845 Last data filed at 02-15-2017 0841  Gross per 24 hour  Intake            774.7 ml  Output                0 ml  Net  774.7 ml   Filed Weights   02/09/2017 0856 02-10-2017 0400  Weight: 73 kg (161 lb) 81.4 kg (179 lb 7.3 oz)    Examination:  General exam: Appears calm and comfortable, unresponsive  Respiratory system: bilateral rhonchi. Respiratory effort normal. Cardiovascular system: S1 & S2 heard, irregular. No JVD, murmurs, rubs, gallops or clicks. No pedal edema. Gastrointestinal system: Abdomen is nondistended, soft and nontender. No organomegaly or masses felt. Normal bowel  sounds heard.  Data Reviewed: I have personally reviewed following labs and imaging studies  CBC:  Recent Labs Lab 01/30/2017 0909 02/06/2017 1857 01/18/17 0026 01/18/17 0357 01/18/17 0830  WBC 28.4* 43.7* 44.7* 45.4* 45.9*  NEUTROABS 23.6*  --   --   --   --   HGB 7.5* 9.0* 9.3* 9.8* 9.8*  HCT 28.5* 32.7* 31.8* 33.5* 32.9*  MCV 80.3 83.2 78.5 78.1 76.2*  PLT 322 258 222 201 401   Basic Metabolic Panel:  Recent Labs Lab 01/18/2017 0909 01/18/17 0357  NA 128* 129*  K 5.3* 5.0  CL 91* 86*  CO2 7* 23  GLUCOSE 87 91  BUN 49* 52*  CREATININE 2.98* 3.09*  CALCIUM 9.6 8.3*   GFR: Estimated Creatinine Clearance: 14.2 mL/min (A) (by C-G formula based on SCr of 3.09 mg/dL (H)). Liver Function Tests:  Recent Labs Lab 01/31/2017 0909  AST 51*  ALT 24  ALKPHOS 106  BILITOT 1.9*  PROT 7.6  ALBUMIN 3.3*   No results for input(s): LIPASE, AMYLASE in the last 168 hours. No results for input(s): AMMONIA in the last 168 hours. Coagulation Profile:  Recent Labs Lab 01/24/2017 0909 01/27/2017 1302 01/24/2017 1857 01/18/17 0026 01/18/17 0357  INR 6.42* 2.02 2.37 2.46 2.37   Cardiac Enzymes: No results for input(s): CKTOTAL, CKMB, CKMBINDEX, TROPONINI in the last 168 hours. BNP (last 3 results) No results for input(s): PROBNP in the last 8760 hours. HbA1C: No results for input(s): HGBA1C in the last 72 hours. CBG:  Recent Labs Lab 01/28/2017 1838  GLUCAP 154*   Lipid Profile: No results for input(s): CHOL, HDL, LDLCALC, TRIG, CHOLHDL, LDLDIRECT in the last 72 hours. Thyroid Function Tests: No results for input(s): TSH, T4TOTAL, FREET4, T3FREE, THYROIDAB in the last 72 hours. Anemia Panel: No results for input(s): VITAMINB12, FOLATE, FERRITIN, TIBC, IRON, RETICCTPCT in the last 72 hours. Sepsis Labs:  Recent Labs Lab 02/11/2017 0909 01/30/2017 1857 01/18/17 0026  PROCALCITON 0.26  --   --   LATICACIDVEN  --  18.0* 9.3*    Recent Results (from the past 240 hour(s))    Culture, blood (x 2)     Status: None (Preliminary result)   Collection Time: 01/15/2017  6:57 PM  Result Value Ref Range Status   Specimen Description RIGHT ANTECUBITAL  Final   Special Requests   Final    BOTTLES DRAWN AEROBIC AND ANAEROBIC Blood Culture adequate volume   Culture NO GROWTH 2 DAYS  Final   Report Status PENDING  Incomplete  Culture, blood (x 2)     Status: None (Preliminary result)   Collection Time: 02/10/2017  6:57 PM  Result Value Ref Range Status   Specimen Description BLOOD RIGHT HAND  Final   Special Requests Blood Culture adequate volume  Final   Culture NO GROWTH 2 DAYS  Final   Report Status PENDING  Incomplete  MRSA PCR Screening     Status: None   Collection Time: 02/07/2017 10:36 PM  Result Value Ref Range Status   MRSA by PCR NEGATIVE  NEGATIVE Final    Comment:        The GeneXpert MRSA Assay (FDA approved for NASAL specimens only), is one component of a comprehensive MRSA colonization surveillance program. It is not intended to diagnose MRSA infection nor to guide or monitor treatment for MRSA infections.          Radiology Studies: Dg Abd 1 View  Result Date: 02/04/2017 CLINICAL DATA:  Abdominal pain. EXAM: ABDOMEN - 1 VIEW COMPARISON:  Sep 04, 2016. FINDINGS: Prominent gaseous distention of the stomach. The remaining bowel gas pattern is normal. No abnormal soft tissue calcifications. Degenerative changes of the lumbar spine. IMPRESSION: Prominent gaseous distention of the stomach. No evidence of bowel obstruction. Electronically Signed   By: Titus Dubin M.D.   On: 01/21/2017 17:16   Dg Chest Port 1 View  Result Date: 02/13/2017 CLINICAL DATA:  Leukocytosis EXAM: PORTABLE CHEST 1 VIEW COMPARISON:  12/18/2016 FINDINGS: Cardiac shadow is mildly enlarged but stable. Aortic calcifications are again seen and stable. The lungs are well aerated bilaterally. No bony abnormality is seen. IMPRESSION: No acute abnormality noted. Electronically Signed    By: Inez Catalina M.D.   On: 01/23/2017 11:53        Scheduled Meds:  Continuous Infusions: . dextrose 5 % 1,000 mL with sodium bicarbonate 150 mEq infusion 10 mL/hr at 01/18/17 2017  . morphine 4 mg/hr (Feb 10, 2017 0841)     LOS: 2 days    Time spent: 62mins    Khalia Gong, MD [facility] Pager [phone]  If 7PM-7AM, please contact night-coverage [email or web address] Password [email or web address] February 10, 2017, 8:45 AM

## 2017-02-14 NOTE — Progress Notes (Signed)
Patient had been made comfort care. Family was in room with patient and Dr. Roderic Palau was on the floor. Patient quit breathing at 0915 and was pronounced. Family asked that we put dentures in mouth and prayer cloth in left hand and see that it stayed there. Medical examiner called at concern of bed placement and ME stated that the medication of concern was not suitable for ME review. Patient was picked up by [facility] at 1215 and told them of family wish of dentures and cloth. [place] also was given death certificate.

## 2017-02-14 NOTE — Discharge Summary (Signed)
Death Summary  [name] EXH:[id number] DOB: [birth year] DOA: 02-09-17  PCP: [name], MD  Admit date: Feb 09, 2017 Date of Death: 02-11-2017 Time of Death: 9:15am   History of present illness:  [name] is a 83 y.o. female with a history of Atrial fibrillation on anticoagulation, chronic kidney disease stage III, chronic anemia from blood loss, admitted to the hospital with generalized weakness. On arrival to the emergency room she was noted to be hypotensive and hypothermic. Labs indicated acute kidney injury on chronic kidney disease, WBC count of 20,000, mildly decreased hemoglobin of 7.5. She also had heme positive stools. She was initially hydrated with IV fluids and admitted for further evaluation. She was transfused 2 units of PRBCs and INR was reversed. She was seen by gastroenterology who indicated that she may have a component of ischemic bowel. Unfortunately, patient was too unstable to be transported down to radiology to undergo CT abdomen. Lactic acid was markedly elevated at 18. She was treated with antibiotics and IV fluids. The following day her the scan was noted to be elevated at 45. Creatinine remained elevated at 3. clinically, the patient did not have any significant improvement. After meeting with palliative care, family had elected to transition the patient to comfort care. She was started on morphine infusion for pain and respiratory distress. Antibiotics were discontinued and IV fluids were discontinued. The patient quickly decompensated and was found to be without respirations/heart sounds at 9:15am on 02-11-17. Family was present at bedside.   Final Diagnoses:    Chronic atrial fibrillation (HCC)   Chronic anticoagulation   Symptomatic anemia   Acute blood loss anemia   CKD (chronic kidney disease) stage 3, GFR 30-59 ml/min (HCC)   GI bleed   Hypotension   AKI (acute kidney injury) ([place])   Dehydration   Metabolic acidosis   Coumadin toxicity  Hyponatremia   Possible Ischemic Bowel   The results of significant diagnostics from this hospitalization (including imaging, microbiology, ancillary and laboratory) are listed below for reference.    Significant Diagnostic Studies: Dg Abd 1 View  Result Date: 02-09-17 CLINICAL DATA:  Abdominal pain. EXAM: ABDOMEN - 1 VIEW COMPARISON:  Sep 04, 2016. FINDINGS: Prominent gaseous distention of the stomach. The remaining bowel gas pattern is normal. No abnormal soft tissue calcifications. Degenerative changes of the lumbar spine. IMPRESSION: Prominent gaseous distention of the stomach. No evidence of bowel obstruction. Electronically Signed   By: Titus Dubin M.D.   On: 2017/02/09 17:16   Dg Chest Port 1 View  Result Date: 02/09/17 CLINICAL DATA:  Leukocytosis EXAM: PORTABLE CHEST 1 VIEW COMPARISON:  12/18/2016 FINDINGS: Cardiac shadow is mildly enlarged but stable. Aortic calcifications are again seen and stable. The lungs are well aerated bilaterally. No bony abnormality is seen. IMPRESSION: No acute abnormality noted. Electronically Signed   By: Inez Catalina M.D.   On: 02-09-17 11:53    Microbiology: Recent Results (from the past 240 hour(s))  Culture, blood (x 2)     Status: None (Preliminary result)   Collection Time: 02/09/2017  6:57 PM  Result Value Ref Range Status   Specimen Description RIGHT ANTECUBITAL  Final   Special Requests   Final    BOTTLES DRAWN AEROBIC AND ANAEROBIC Blood Culture adequate volume   Culture NO GROWTH 2 DAYS  Final   Report Status PENDING  Incomplete  Culture, blood (x 2)     Status: None (Preliminary result)   Collection Time: 02/09/2017  6:57 PM  Result Value  Ref Range Status   Specimen Description BLOOD RIGHT HAND  Final   Special Requests Blood Culture adequate volume  Final   Culture NO GROWTH 2 DAYS  Final   Report Status PENDING  Incomplete  MRSA PCR Screening     Status: None   Collection Time: 02/06/2017 10:36 PM  Result Value Ref Range  Status   MRSA by PCR NEGATIVE NEGATIVE Final    Comment:        The GeneXpert MRSA Assay (FDA approved for NASAL specimens only), is one component of a comprehensive MRSA colonization surveillance program. It is not intended to diagnose MRSA infection nor to guide or monitor treatment for MRSA infections.      Labs: Basic Metabolic Panel:  Recent Labs Lab 01/29/2017 0909 01/18/17 0357  NA 128* 129*  K 5.3* 5.0  CL 91* 86*  CO2 7* 23  GLUCOSE 87 91  BUN 49* 52*  CREATININE 2.98* 3.09*  CALCIUM 9.6 8.3*   Liver Function Tests:  Recent Labs Lab 01/27/2017 0909  AST 51*  ALT 24  ALKPHOS 106  BILITOT 1.9*  PROT 7.6  ALBUMIN 3.3*   No results for input(s): LIPASE, AMYLASE in the last 168 hours. No results for input(s): AMMONIA in the last 168 hours. CBC:  Recent Labs Lab 02/02/2017 0909 01/14/2017 1857 01/18/17 0026 01/18/17 0357 01/18/17 0830  WBC 28.4* 43.7* 44.7* 45.4* 45.9*  NEUTROABS 23.6*  --   --   --   --   HGB 7.5* 9.0* 9.3* 9.8* 9.8*  HCT 28.5* 32.7* 31.8* 33.5* 32.9*  MCV 80.3 83.2 78.5 78.1 76.2*  PLT 322 258 222 201 211   Cardiac Enzymes: No results for input(s): CKTOTAL, CKMB, CKMBINDEX, TROPONINI in the last 168 hours. D-Dimer No results for input(s): DDIMER in the last 72 hours. BNP: Invalid input(s): POCBNP CBG:  Recent Labs Lab 02/04/2017 1838  GLUCAP 154*   Anemia work up No results for input(s): VITAMINB12, FOLATE, FERRITIN, TIBC, IRON, RETICCTPCT in the last 72 hours. Urinalysis    Component Value Date/Time   COLORURINE YELLOW 12/18/2016 1700   APPEARANCEUR CLEAR 12/18/2016 1700   LABSPEC 1.008 12/18/2016 1700   PHURINE 7.0 12/18/2016 1700   GLUCOSEU NEGATIVE 12/18/2016 1700   HGBUR NEGATIVE 12/18/2016 1700   BILIRUBINUR NEGATIVE 12/18/2016 1700   KETONESUR NEGATIVE 12/18/2016 1700   PROTEINUR NEGATIVE 12/18/2016 1700   UROBILINOGEN 0.2 08/31/2009 1628   NITRITE NEGATIVE 12/18/2016 1700   LEUKOCYTESUR NEGATIVE 12/18/2016  1700   Sepsis Labs Invalid input(s): PROCALCITONIN,  WBC,  LACTICIDVEN     SIGNEDKathie Dike, MD  [facility] 02-16-2017, 5:32 PM Pager   If 7PM-7AM, please contact night-coverage [email or web address] Password [email or web address]

## 2017-02-14 NOTE — Progress Notes (Signed)
Patient had morphine for pain management in room and waiting to be given. Wasted 420 mls which was one full bag and over half bag with 188mls in it. Wasted in sink and witnessed by Florence Canner RN.

## 2017-02-14 DEATH — deceased

## 2017-02-15 ENCOUNTER — Ambulatory Visit (HOSPITAL_COMMUNITY): Admit: 2017-02-15 | Payer: Medicare Other | Admitting: Ophthalmology

## 2017-02-15 SURGERY — PHACOEMULSIFICATION, CATARACT, WITH IOL INSERTION
Anesthesia: Monitor Anesthesia Care | Laterality: Left

## 2017-04-15 IMAGING — CT CT HEAD W/O CM
3 series · 15 of 47 positions shown, 18 images · non-contrast
Comparison: None.

CLINICAL DATA: Patient complains of feeling weak and dizzy for 4
days. One month ago she had bleeding from AVM in her stomach and had
to be transfused. Hemoglobin very low, receiving transfusion right
now/ had seizure today/ no hx of seizures.

EXAM:
CT HEAD WITHOUT CONTRAST
TECHNIQUE: Contiguous axial images were obtained from the base of the skull
through the vertex without intravenous contrast.

[Series 2: head trauma wo · axial · 0.40mm/px · z∈[+1083,+1208]mm · 9 of 30 slices shown, 12 images]
[im 3/30  brain]
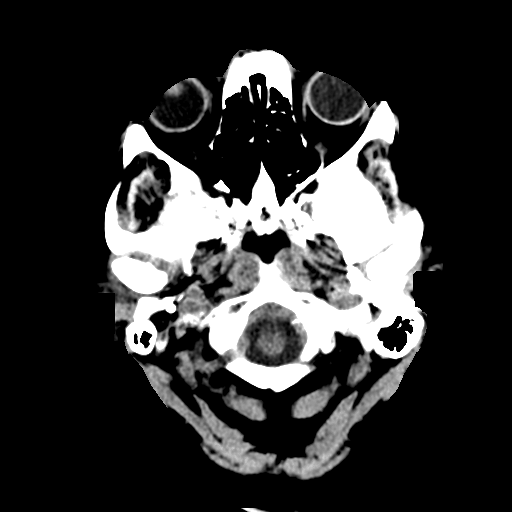
[im 3/30  bone]
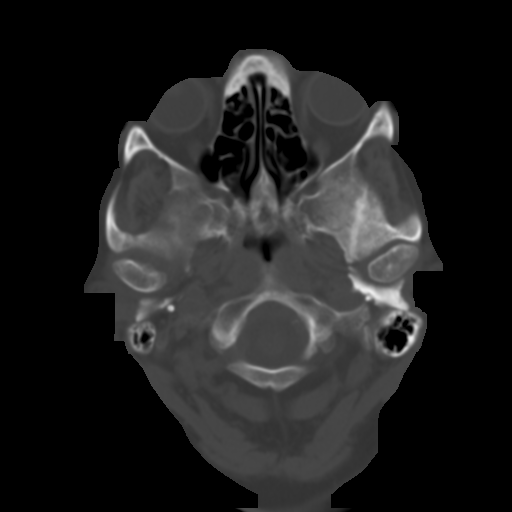
[im 6/30  brain]
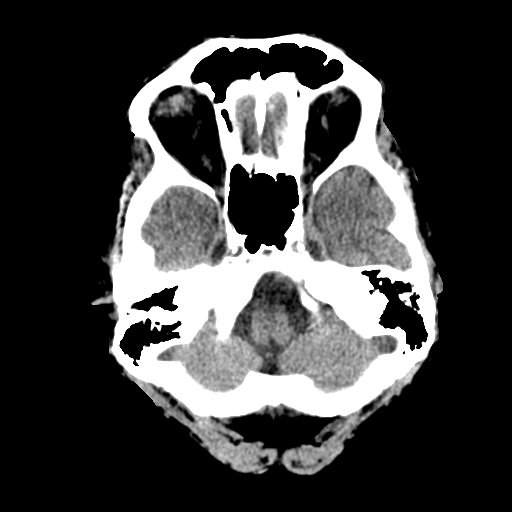
[im 9/30  brain]
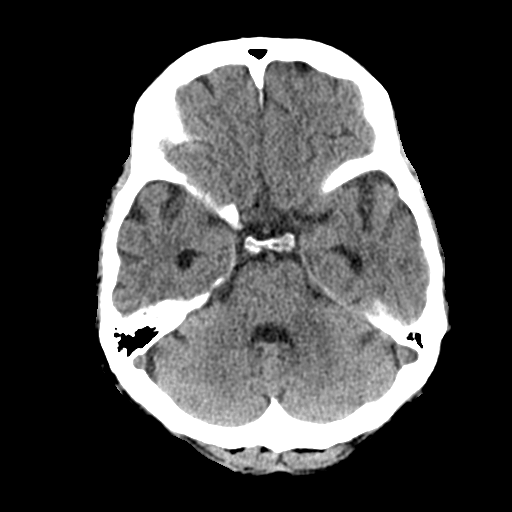
[im 12/30  brain]
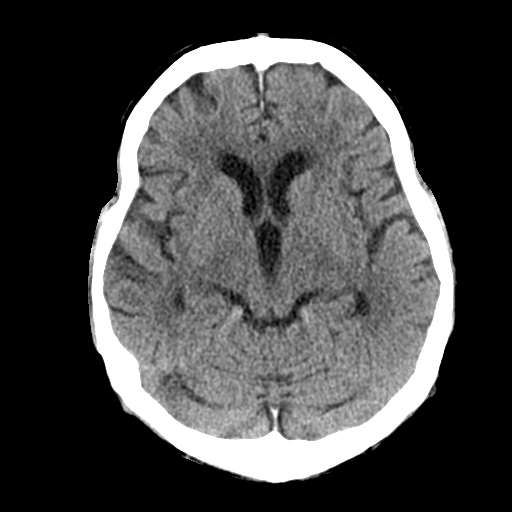
[im 16/30  brain]
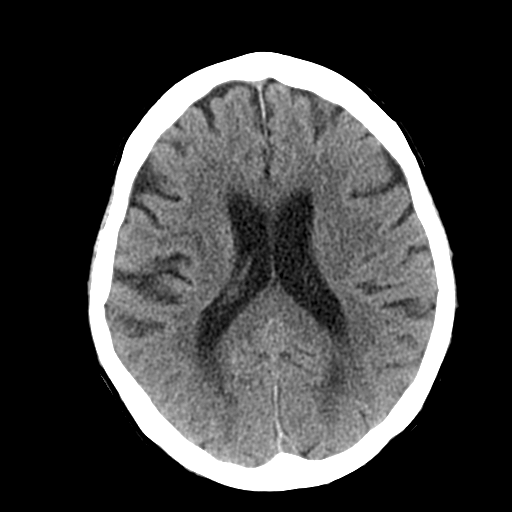
[im 16/30  bone]
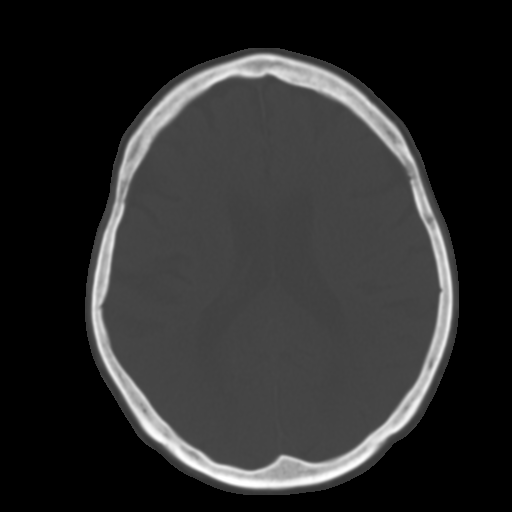
[im 19/30  brain]
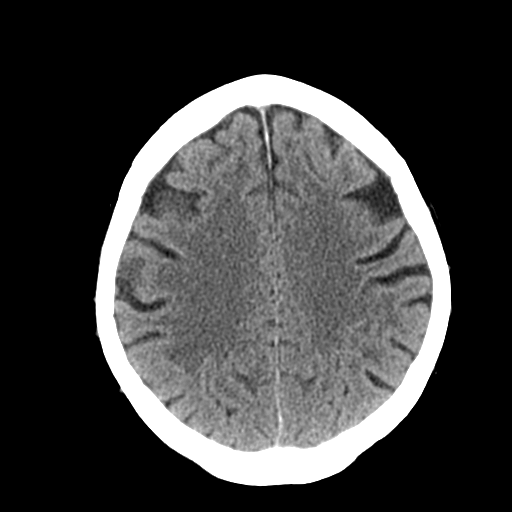
[im 22/30  brain]
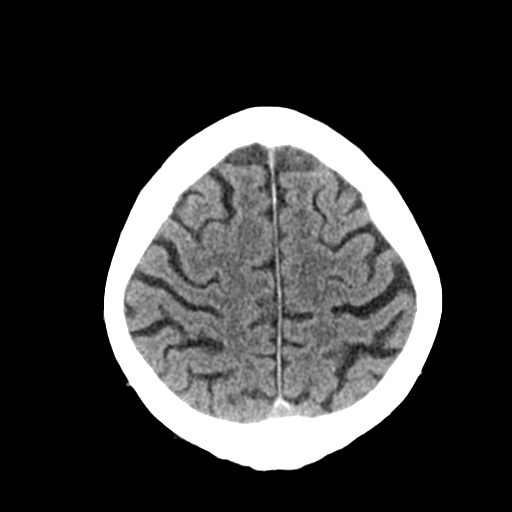
[im 25/30  brain]
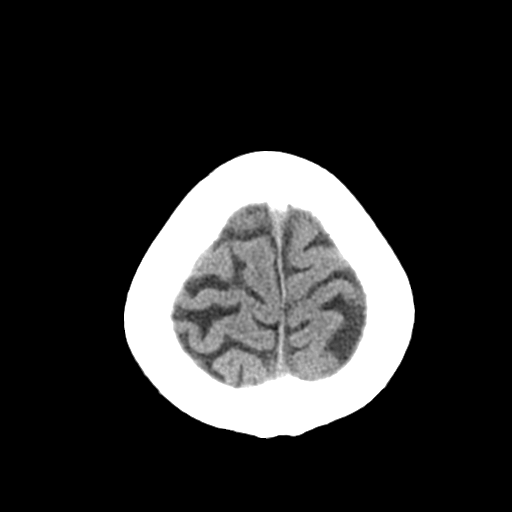
[im 28/30  brain]
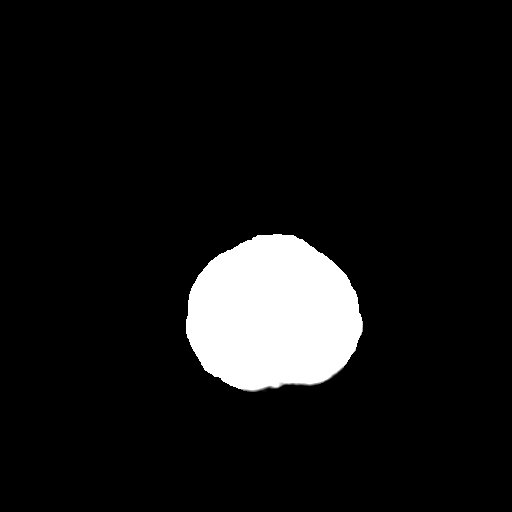
[im 28/30  bone]
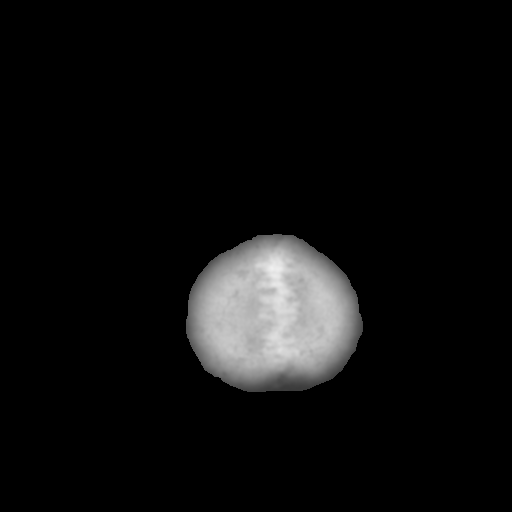

[Series 4: coronal soft tissue · coronal · 0.28mm/px · 3 of 63 slices shown]
[im 21/63  brain]
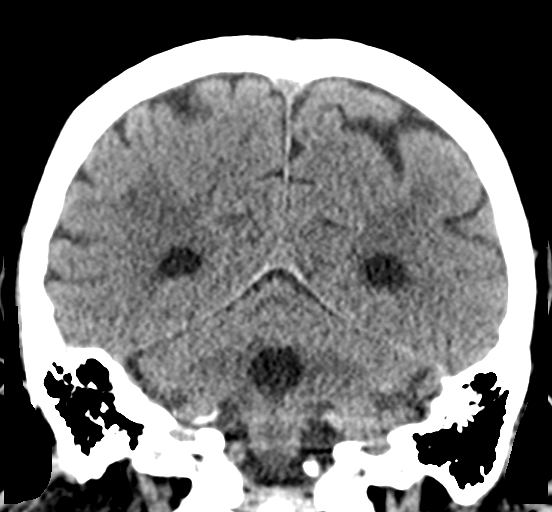
[im 28/63  brain]
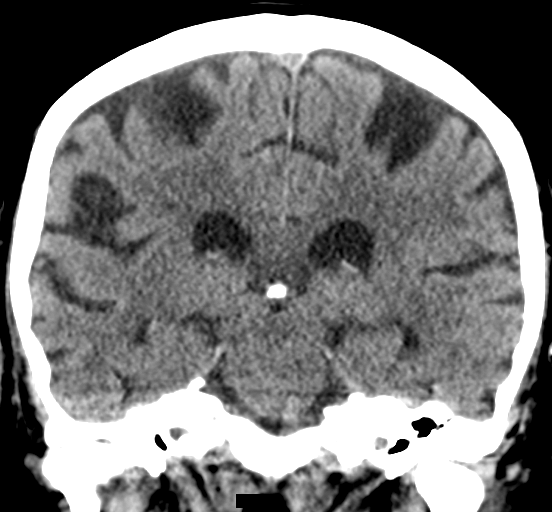
[im 35/63  brain]
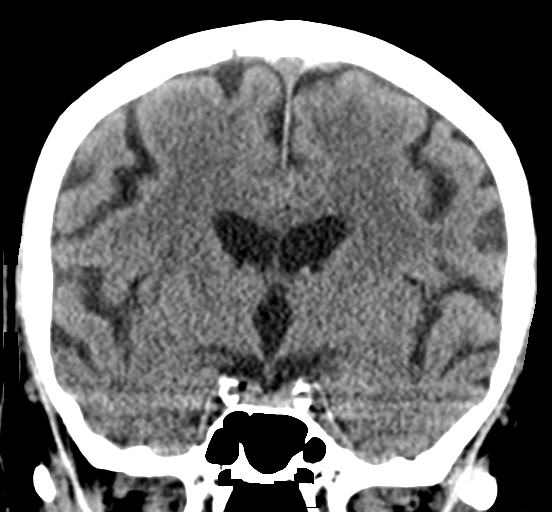

[Series 5: sagittal soft tissue · sagittal · 0.28mm/px · 3 of 52 slices shown]
[im 18/52  brain]
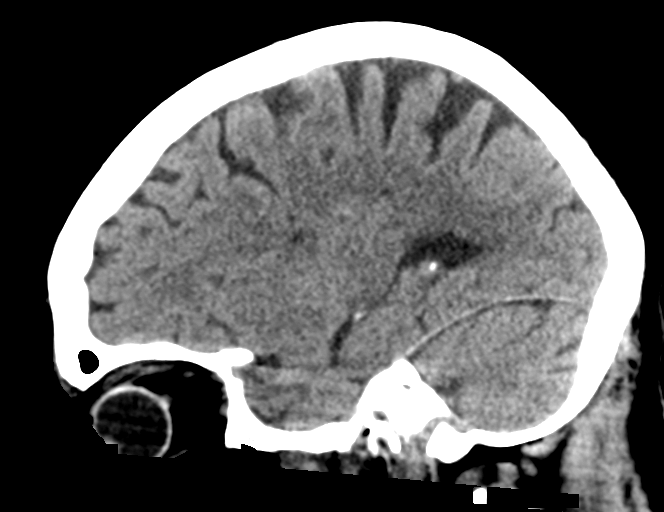
[im 26/52  brain]
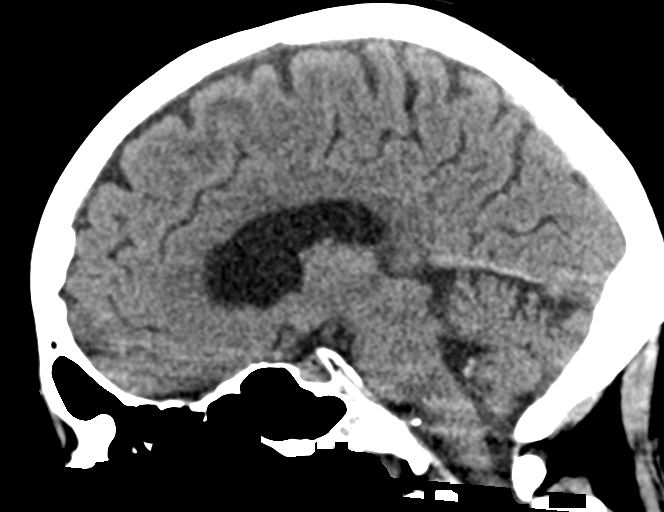
[im 35/52  brain]
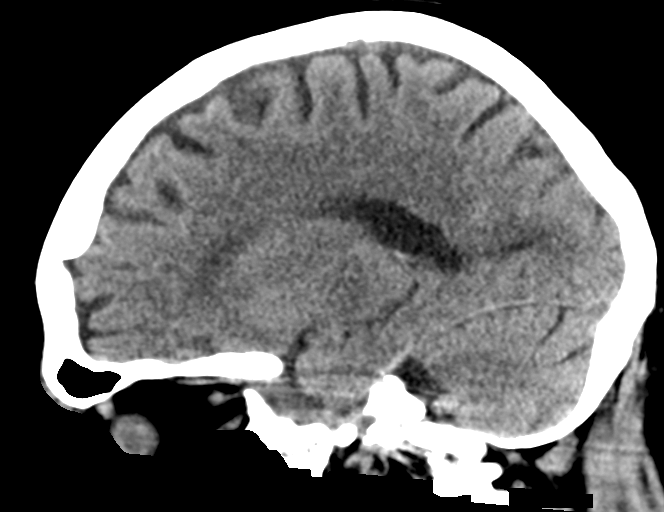

[15 of 47 positions shown; findings below may reference images not displayed]

FINDINGS: Brain: Mild low density in the periventricular white matter likely
related to small vessel disease. No mass lesion, hemorrhage,
hydrocephalus, acute infarct, intra-axial, or extra-axial fluid
collection. Expected cerebral volume loss for age.

Vascular: Carotid and vertebral artery calcified atherosclerosis.

Skull: Normal

Sinuses/Orbits: Normal orbits and globes. Clear paranasal sinuses
and mastoid air cells. Cerumen in both external ear canals.

Other: None
IMPRESSION: 1.  No acute intracranial abnormality.
2. Mild small vessel ischemic change.

## 2017-04-15 IMAGING — CR DG CHEST 1V PORT
1 series · 1 of 1 positions shown · non-contrast
Comparison: Radiographs September 02, 2009.

CLINICAL DATA: Weakness.

EXAM:
PORTABLE CHEST 1 VIEW

[portable]
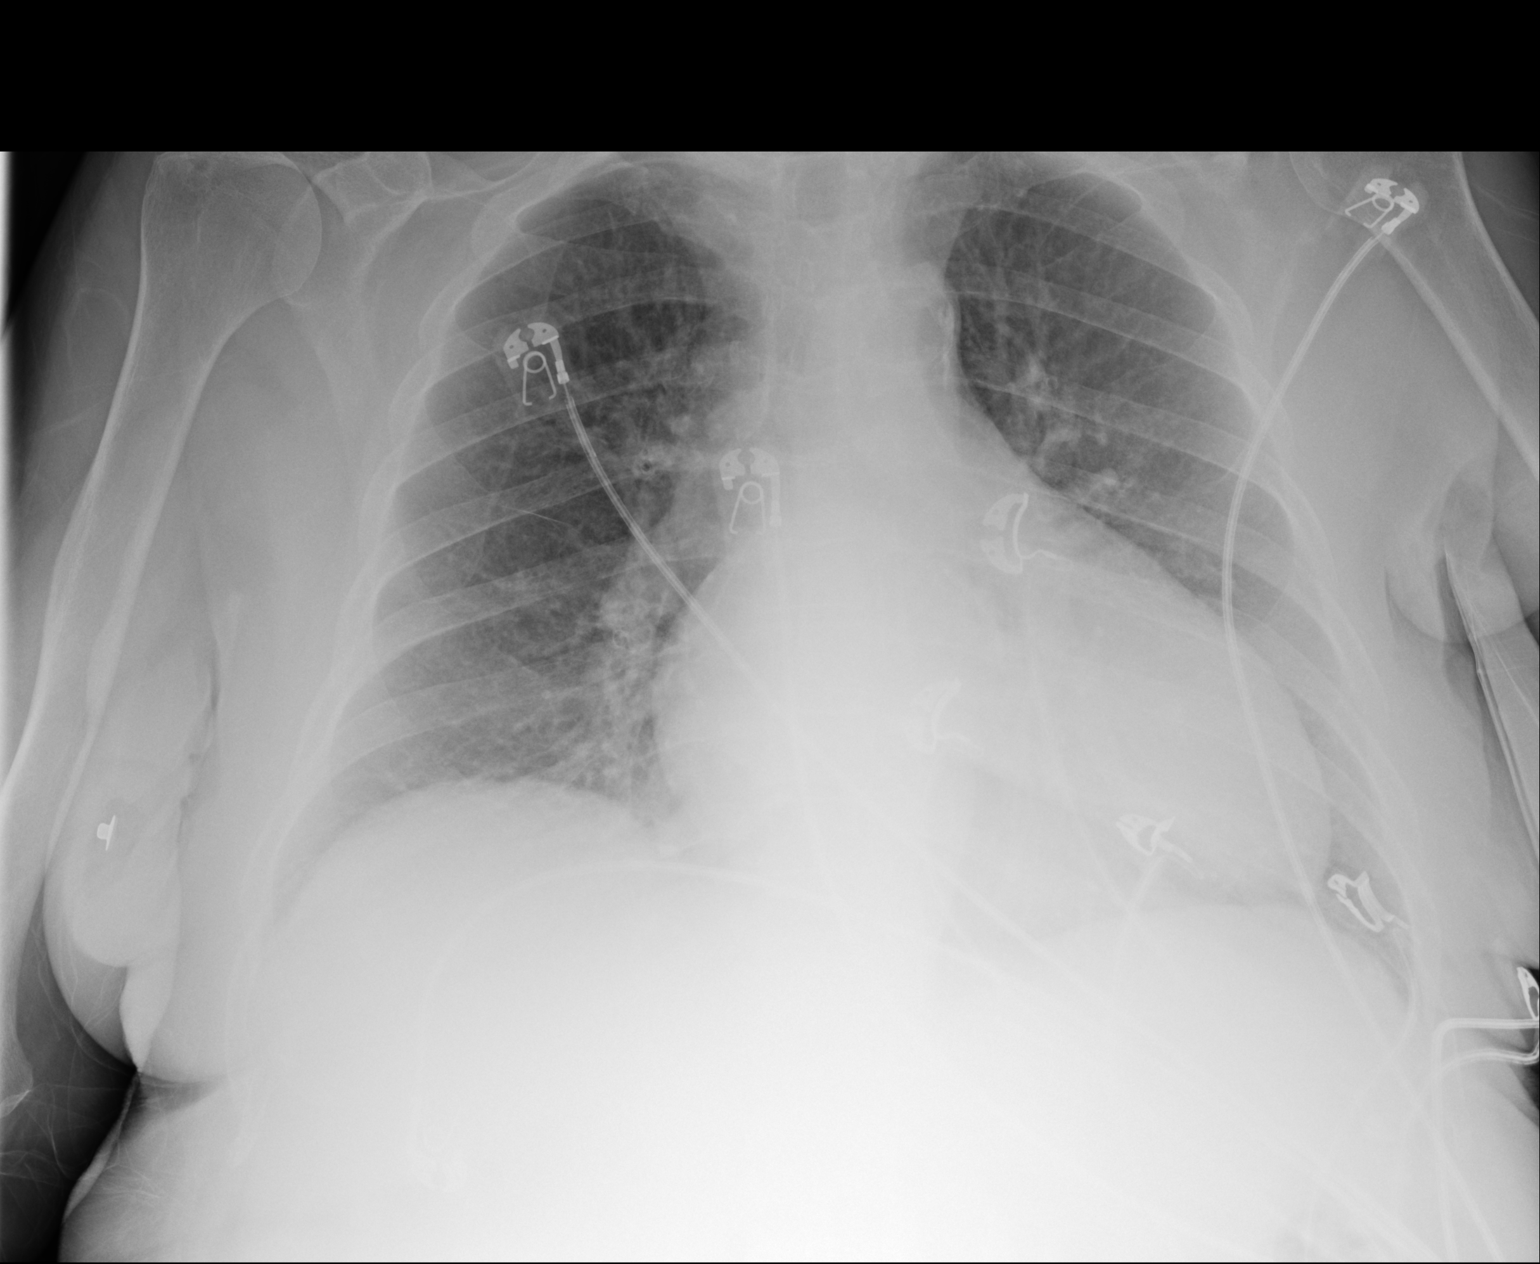

[1 of 1 positions shown; findings below may reference images not displayed]

FINDINGS: Stable cardiomediastinal silhouette. Atherosclerosis of thoracic
aorta is noted. No pneumothorax or pleural effusion is noted. Both
lungs are clear. The visualized skeletal structures are
unremarkable.
IMPRESSION: No acute cardiopulmonary abnormality seen.  Aortic atherosclerosis.

## 2017-11-15 IMAGING — CR DG CHEST 1V PORT
1 series · 1 of 1 positions shown · non-contrast
Comparison: 12/18/2016

CLINICAL DATA: Leukocytosis

EXAM:
PORTABLE CHEST 1 VIEW

[portable]
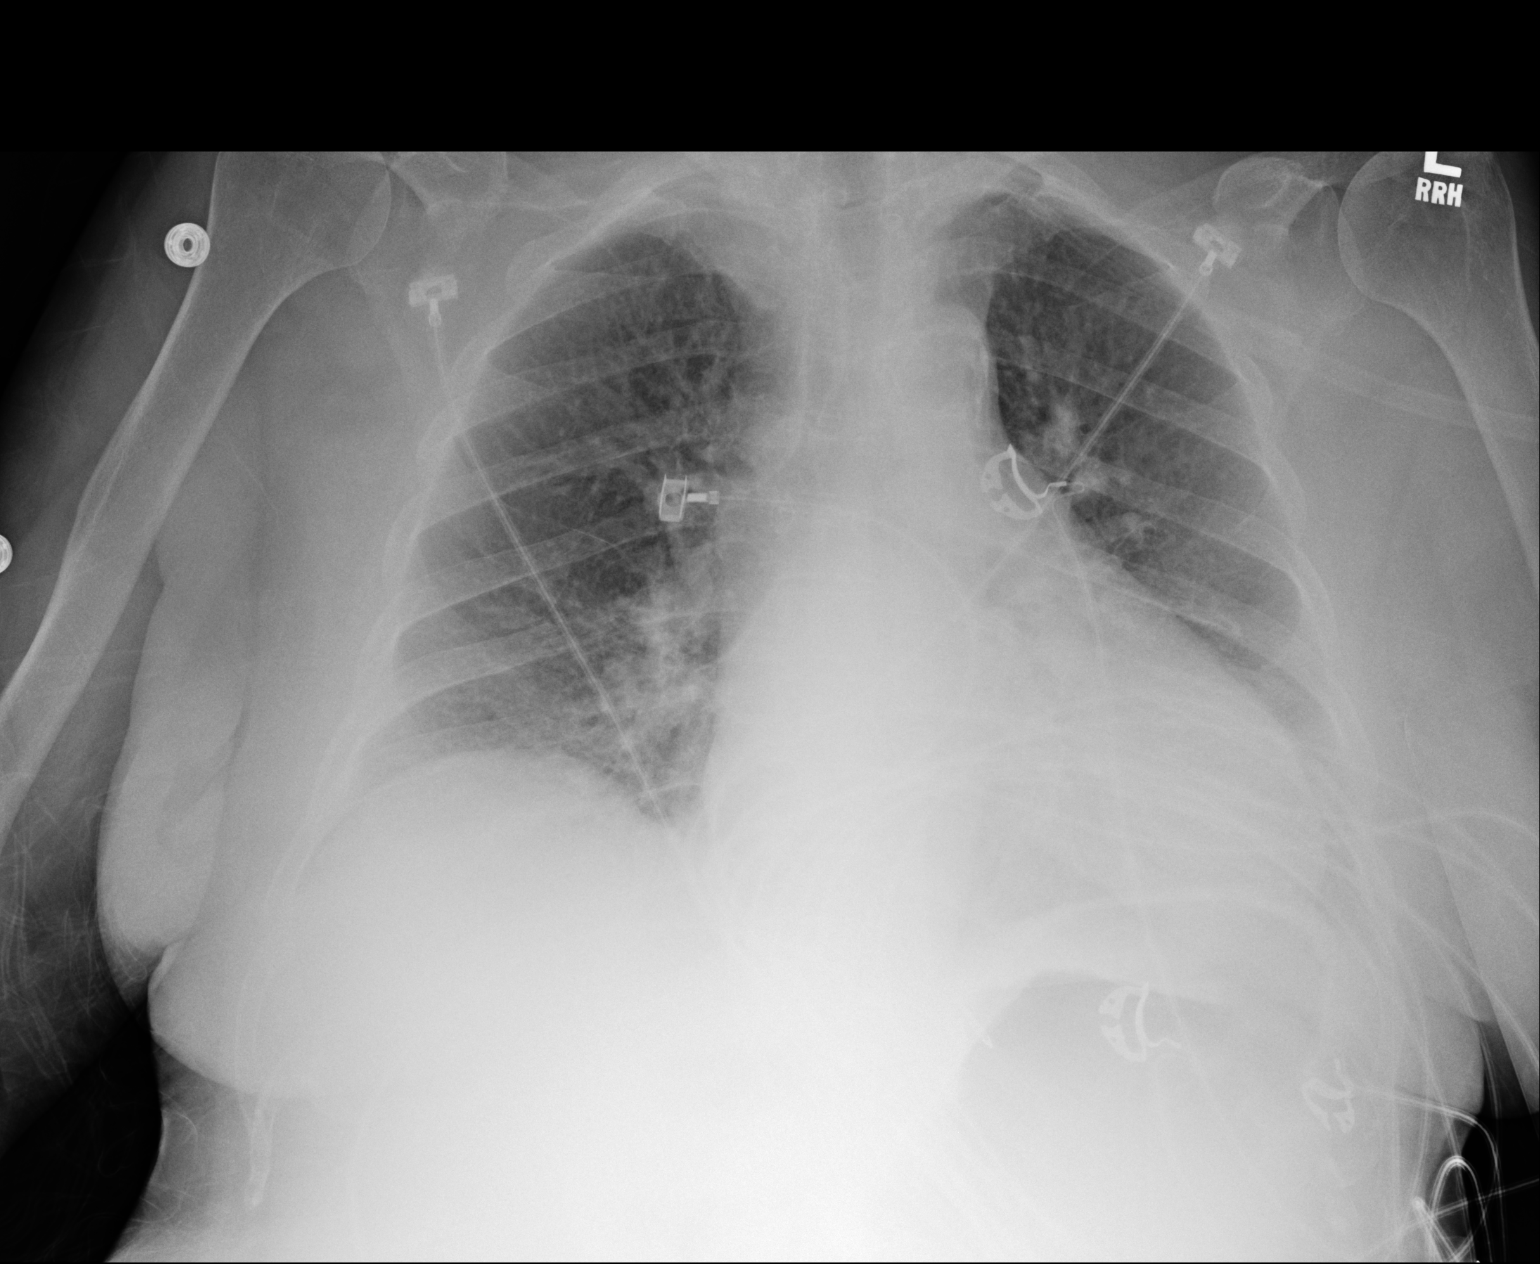

[1 of 1 positions shown; findings below may reference images not displayed]

FINDINGS: Cardiac shadow is mildly enlarged but stable. Aortic calcifications
are again seen and stable. The lungs are well aerated bilaterally.
No bony abnormality is seen.
IMPRESSION: No acute abnormality noted.
